# Patient Record
Sex: Female | Born: 1965 | Race: Black or African American | Hispanic: No | Smoking: Never smoker
Health system: Southern US, Community
[De-identification: ages and names within clinical notes are randomized; demographics above are authoritative.]

## PROBLEM LIST (undated history)

## (undated) DIAGNOSIS — I639 Cerebral infarction, unspecified: Secondary | ICD-10-CM

## (undated) DIAGNOSIS — I10 Essential (primary) hypertension: Secondary | ICD-10-CM

## (undated) DIAGNOSIS — D869 Sarcoidosis, unspecified: Secondary | ICD-10-CM

## (undated) DIAGNOSIS — N6019 Diffuse cystic mastopathy of unspecified breast: Secondary | ICD-10-CM

## (undated) DIAGNOSIS — D649 Anemia, unspecified: Secondary | ICD-10-CM

## (undated) DIAGNOSIS — J45909 Unspecified asthma, uncomplicated: Secondary | ICD-10-CM

## (undated) DIAGNOSIS — R918 Other nonspecific abnormal finding of lung field: Secondary | ICD-10-CM

## (undated) DIAGNOSIS — K469 Unspecified abdominal hernia without obstruction or gangrene: Secondary | ICD-10-CM

## (undated) DIAGNOSIS — R0602 Shortness of breath: Secondary | ICD-10-CM

## (undated) DIAGNOSIS — F419 Anxiety disorder, unspecified: Secondary | ICD-10-CM

## (undated) HISTORY — DX: Cerebral infarction, unspecified: I63.9

## (undated) HISTORY — DX: Diffuse cystic mastopathy of unspecified breast: N60.19

## (undated) HISTORY — DX: Anemia, unspecified: D64.9

## (undated) HISTORY — PX: RESECTION RIBS EXTRAPLEURAL: SUR1247

## (undated) HISTORY — DX: Anxiety disorder, unspecified: F41.9

## (undated) HISTORY — PX: BREAST EXCISIONAL BIOPSY: SUR124

## (undated) HISTORY — DX: Other nonspecific abnormal finding of lung field: R91.8

## (undated) HISTORY — DX: Essential (primary) hypertension: I10

## (undated) HISTORY — DX: Unspecified abdominal hernia without obstruction or gangrene: K46.9

---

## 2007-08-30 HISTORY — PX: ABDOMINAL HYSTERECTOMY: SHX81

## 2008-04-01 ENCOUNTER — Ambulatory Visit: Payer: Self-pay | Admitting: Obstetrics & Gynecology

## 2008-04-03 ENCOUNTER — Inpatient Hospital Stay: Payer: Self-pay | Admitting: Obstetrics & Gynecology

## 2008-04-08 ENCOUNTER — Inpatient Hospital Stay: Payer: Self-pay | Admitting: Obstetrics & Gynecology

## 2008-07-11 ENCOUNTER — Inpatient Hospital Stay: Payer: Self-pay | Admitting: Surgery

## 2008-07-11 ENCOUNTER — Ambulatory Visit: Payer: Self-pay | Admitting: Family Medicine

## 2008-08-03 ENCOUNTER — Inpatient Hospital Stay: Payer: Self-pay | Admitting: Obstetrics & Gynecology

## 2008-12-24 ENCOUNTER — Ambulatory Visit: Payer: Self-pay | Admitting: Internal Medicine

## 2009-08-29 DIAGNOSIS — R918 Other nonspecific abnormal finding of lung field: Secondary | ICD-10-CM

## 2009-08-29 DIAGNOSIS — K469 Unspecified abdominal hernia without obstruction or gangrene: Secondary | ICD-10-CM

## 2009-08-29 HISTORY — PX: OTHER SURGICAL HISTORY: SHX169

## 2009-08-29 HISTORY — PX: LUNG BIOPSY: SHX232

## 2009-08-29 HISTORY — DX: Other nonspecific abnormal finding of lung field: R91.8

## 2009-08-29 HISTORY — DX: Unspecified abdominal hernia without obstruction or gangrene: K46.9

## 2009-09-29 ENCOUNTER — Ambulatory Visit: Payer: Self-pay | Admitting: Internal Medicine

## 2009-10-22 ENCOUNTER — Emergency Department: Payer: Self-pay | Admitting: Emergency Medicine

## 2009-10-22 ENCOUNTER — Ambulatory Visit: Payer: Self-pay | Admitting: Internal Medicine

## 2009-10-27 ENCOUNTER — Ambulatory Visit: Payer: Self-pay | Admitting: Internal Medicine

## 2009-10-29 ENCOUNTER — Ambulatory Visit: Payer: Self-pay | Admitting: Internal Medicine

## 2009-11-06 ENCOUNTER — Ambulatory Visit: Payer: Self-pay | Admitting: General Surgery

## 2009-11-11 ENCOUNTER — Ambulatory Visit: Payer: Self-pay | Admitting: General Surgery

## 2009-11-24 ENCOUNTER — Ambulatory Visit: Payer: Self-pay | Admitting: General Surgery

## 2009-11-27 ENCOUNTER — Ambulatory Visit: Payer: Self-pay | Admitting: Internal Medicine

## 2009-12-08 ENCOUNTER — Ambulatory Visit: Payer: Self-pay | Admitting: Internal Medicine

## 2010-02-08 ENCOUNTER — Ambulatory Visit: Payer: Self-pay | Admitting: Internal Medicine

## 2010-03-15 ENCOUNTER — Other Ambulatory Visit: Payer: Self-pay | Admitting: Internal Medicine

## 2010-03-29 ENCOUNTER — Ambulatory Visit: Payer: Self-pay | Admitting: Internal Medicine

## 2010-04-20 ENCOUNTER — Ambulatory Visit: Payer: Self-pay | Admitting: General Surgery

## 2010-04-28 ENCOUNTER — Ambulatory Visit: Payer: Self-pay | Admitting: Internal Medicine

## 2010-07-28 ENCOUNTER — Ambulatory Visit: Payer: Self-pay | Admitting: Internal Medicine

## 2010-08-02 ENCOUNTER — Ambulatory Visit: Payer: Self-pay | Admitting: General Surgery

## 2010-08-10 ENCOUNTER — Ambulatory Visit: Payer: Self-pay | Admitting: General Surgery

## 2010-08-11 ENCOUNTER — Inpatient Hospital Stay: Payer: Self-pay | Admitting: General Surgery

## 2010-11-16 ENCOUNTER — Ambulatory Visit: Payer: Self-pay | Admitting: General Surgery

## 2011-01-12 ENCOUNTER — Emergency Department: Payer: Self-pay | Admitting: Emergency Medicine

## 2011-01-13 ENCOUNTER — Ambulatory Visit: Payer: Self-pay | Admitting: Internal Medicine

## 2011-04-08 ENCOUNTER — Emergency Department: Payer: Self-pay | Admitting: *Deleted

## 2011-04-29 ENCOUNTER — Ambulatory Visit: Payer: Self-pay | Admitting: Internal Medicine

## 2011-05-24 ENCOUNTER — Other Ambulatory Visit: Payer: Self-pay

## 2011-08-01 ENCOUNTER — Inpatient Hospital Stay: Payer: Self-pay | Admitting: Internal Medicine

## 2011-08-09 ENCOUNTER — Ambulatory Visit: Payer: Self-pay | Admitting: Internal Medicine

## 2011-08-30 DIAGNOSIS — N6019 Diffuse cystic mastopathy of unspecified breast: Secondary | ICD-10-CM

## 2011-08-30 HISTORY — DX: Diffuse cystic mastopathy of unspecified breast: N60.19

## 2011-11-22 ENCOUNTER — Ambulatory Visit: Payer: Self-pay | Admitting: General Surgery

## 2012-05-18 ENCOUNTER — Ambulatory Visit: Payer: Self-pay | Admitting: Physician Assistant

## 2012-05-18 LAB — CBC WITH DIFFERENTIAL/PLATELET
Basophil #: 0 10*3/uL (ref 0.0–0.1)
Basophil %: 0.6 %
Eosinophil #: 0.2 10*3/uL (ref 0.0–0.7)
Eosinophil %: 2.9 %
HCT: 38.7 % (ref 35.0–47.0)
HGB: 12.9 g/dL (ref 12.0–16.0)
Lymphocyte #: 2 10*3/uL (ref 1.0–3.6)
Lymphocyte %: 30.2 %
MCH: 27.8 pg (ref 26.0–34.0)
MCHC: 33.2 g/dL (ref 32.0–36.0)
MCV: 84 fL (ref 80–100)
Monocyte #: 0.6 x10 3/mm (ref 0.2–0.9)
Monocyte %: 9.7 %
Neutrophil #: 3.8 10*3/uL (ref 1.4–6.5)
Neutrophil %: 56.6 %
Platelet: 222 10*3/uL (ref 150–440)
RBC: 4.63 10*6/uL (ref 3.80–5.20)
RDW: 14.2 % (ref 11.5–14.5)
WBC: 6.6 10*3/uL (ref 3.6–11.0)

## 2012-05-18 LAB — COMPREHENSIVE METABOLIC PANEL
Albumin: 3.6 g/dL (ref 3.4–5.0)
Alkaline Phosphatase: 112 U/L (ref 50–136)
Anion Gap: 9 (ref 7–16)
BUN: 10 mg/dL (ref 7–18)
Bilirubin,Total: 0.1 mg/dL — ABNORMAL LOW (ref 0.2–1.0)
Calcium, Total: 9.3 mg/dL (ref 8.5–10.1)
Chloride: 108 mmol/L — ABNORMAL HIGH (ref 98–107)
Co2: 28 mmol/L (ref 21–32)
Creatinine: 0.85 mg/dL (ref 0.60–1.30)
EGFR (African American): >60
EGFR (Non-African Amer.): >60
Glucose: 77 mg/dL (ref 65–99)
Osmolality: 287 (ref 275–301)
Potassium: 3.2 mmol/L — ABNORMAL LOW (ref 3.5–5.1)
SGOT(AST): 17 U/L (ref 15–37)
SGPT (ALT): 19 U/L (ref 12–78)
Sodium: 145 mmol/L (ref 136–145)
Total Protein: 7.7 g/dL (ref 6.4–8.2)

## 2012-05-18 LAB — TSH: Thyroid Stimulating Horm: 0.612 u[IU]/mL

## 2012-05-18 LAB — T4, FREE: Free Thyroxine: 0.99 ng/dL (ref 0.76–1.46)

## 2012-06-12 ENCOUNTER — Ambulatory Visit: Payer: Self-pay | Admitting: Internal Medicine

## 2012-06-21 ENCOUNTER — Ambulatory Visit: Payer: Self-pay | Admitting: Cardiothoracic Surgery

## 2012-06-25 ENCOUNTER — Ambulatory Visit: Payer: Self-pay | Admitting: Internal Medicine

## 2012-06-25 LAB — BASIC METABOLIC PANEL
Anion Gap: 10 (ref 7–16)
BUN: 13 mg/dL (ref 7–18)
Calcium, Total: 9.2 mg/dL (ref 8.5–10.1)
Chloride: 106 mmol/L (ref 98–107)
Co2: 24 mmol/L (ref 21–32)
Creatinine: 0.87 mg/dL (ref 0.60–1.30)
EGFR (African American): >60
EGFR (Non-African Amer.): >60
Glucose: 72 mg/dL (ref 65–99)
Osmolality: 278 (ref 275–301)
Potassium: 3.3 mmol/L — ABNORMAL LOW (ref 3.5–5.1)
Sodium: 140 mmol/L (ref 136–145)

## 2012-06-25 LAB — CBC WITH DIFFERENTIAL/PLATELET
Basophil #: 0 10*3/uL (ref 0.0–0.1)
Basophil %: 0.8 %
Eosinophil #: 0.2 10*3/uL (ref 0.0–0.7)
Eosinophil %: 2.8 %
HCT: 38.1 % (ref 35.0–47.0)
HGB: 12.7 g/dL (ref 12.0–16.0)
Lymphocyte #: 1.5 10*3/uL (ref 1.0–3.6)
Lymphocyte %: 27.4 %
MCH: 27.9 pg (ref 26.0–34.0)
MCHC: 33.4 g/dL (ref 32.0–36.0)
MCV: 83 fL (ref 80–100)
Monocyte #: 0.5 x10 3/mm (ref 0.2–0.9)
Monocyte %: 8.5 %
Neutrophil #: 3.3 10*3/uL (ref 1.4–6.5)
Neutrophil %: 60.5 %
Platelet: 188 10*3/uL (ref 150–440)
RBC: 4.57 10*6/uL (ref 3.80–5.20)
RDW: 13.9 % (ref 11.5–14.5)
WBC: 5.5 10*3/uL (ref 3.6–11.0)

## 2012-06-25 LAB — TSH: Thyroid Stimulating Horm: 1.77 u[IU]/mL

## 2012-06-25 LAB — SEDIMENTATION RATE: Erythrocyte Sed Rate: 21 mm/hr — ABNORMAL HIGH (ref 0–20)

## 2012-06-28 ENCOUNTER — Ambulatory Visit: Payer: Self-pay | Admitting: Cardiothoracic Surgery

## 2012-07-02 ENCOUNTER — Other Ambulatory Visit (HOSPITAL_COMMUNITY): Payer: Self-pay | Admitting: Cardiothoracic Surgery

## 2012-07-02 DIAGNOSIS — R918 Other nonspecific abnormal finding of lung field: Secondary | ICD-10-CM

## 2012-07-03 ENCOUNTER — Ambulatory Visit: Payer: Self-pay | Admitting: Internal Medicine

## 2012-07-03 ENCOUNTER — Encounter (HOSPITAL_COMMUNITY): Payer: Self-pay | Admitting: Pharmacy Technician

## 2012-07-06 ENCOUNTER — Other Ambulatory Visit: Payer: Self-pay | Admitting: Radiology

## 2012-07-09 ENCOUNTER — Ambulatory Visit (HOSPITAL_COMMUNITY)
Admission: RE | Admit: 2012-07-09 | Discharge: 2012-07-09 | Disposition: A | Discharge status: Home / Self Care | Payer: No Typology Code available for payment source | Source: Ambulatory Visit | Attending: Cardiothoracic Surgery | Admitting: Cardiothoracic Surgery

## 2012-07-09 ENCOUNTER — Encounter (HOSPITAL_COMMUNITY): Payer: Self-pay

## 2012-07-09 DIAGNOSIS — D869 Sarcoidosis, unspecified: Secondary | ICD-10-CM | POA: Insufficient documentation

## 2012-07-09 DIAGNOSIS — R918 Other nonspecific abnormal finding of lung field: Secondary | ICD-10-CM

## 2012-07-09 DIAGNOSIS — Z90710 Acquired absence of both cervix and uterus: Secondary | ICD-10-CM | POA: Insufficient documentation

## 2012-07-09 DIAGNOSIS — R222 Localized swelling, mass and lump, trunk: Secondary | ICD-10-CM | POA: Insufficient documentation

## 2012-07-09 HISTORY — DX: Sarcoidosis, unspecified: D86.9

## 2012-07-09 HISTORY — DX: Shortness of breath: R06.02

## 2012-07-09 LAB — CBC
HCT: 43.3 % (ref 36.0–46.0)
Hemoglobin: 14.4 g/dL (ref 12.0–15.0)
MCH: 27 pg (ref 26.0–34.0)
MCHC: 33.3 g/dL (ref 30.0–36.0)
MCV: 81.2 fL (ref 78.0–100.0)
Platelets: 243 10*3/uL (ref 150–400)
RBC: 5.33 MIL/uL — ABNORMAL HIGH (ref 3.87–5.11)
RDW: 13.2 % (ref 11.5–15.5)
WBC: 6.1 10*3/uL (ref 4.0–10.5)

## 2012-07-09 LAB — PROTIME-INR
INR: 0.97 (ref 0.00–1.49)
Prothrombin Time: 12.8 seconds (ref 11.6–15.2)

## 2012-07-09 LAB — APTT: aPTT: 40 seconds — ABNORMAL HIGH (ref 24–37)

## 2012-07-09 MED ORDER — MIDAZOLAM HCL 2 MG/2ML IJ SOLN
INTRAMUSCULAR | Status: AC
  Filled 2012-07-09: qty 4

## 2012-07-09 MED ORDER — MIDAZOLAM HCL 2 MG/2ML IJ SOLN
INTRAMUSCULAR | Status: AC | PRN
  Administered 2012-07-09 (×3): 1 mg via INTRAVENOUS

## 2012-07-09 MED ORDER — FENTANYL CITRATE 0.05 MG/ML IJ SOLN
INTRAMUSCULAR | Status: AC
  Filled 2012-07-09: qty 4

## 2012-07-09 MED ORDER — SODIUM CHLORIDE 0.9 % IV SOLN
Freq: Once | INTRAVENOUS | Status: AC
  Administered 2012-07-09: 13:00:00 via INTRAVENOUS

## 2012-07-09 MED ORDER — ALBUTEROL SULFATE (5 MG/ML) 0.5% IN NEBU
2.5000 mg | INHALATION_SOLUTION | RESPIRATORY_TRACT | Status: DC | PRN
  Filled 2012-07-09: qty 0.5

## 2012-07-09 MED ORDER — HYDROCODONE-ACETAMINOPHEN 5-325 MG PO TABS
1.0000 | ORAL_TABLET | ORAL | Status: DC | PRN
  Filled 2012-07-09: qty 2

## 2012-07-09 MED ORDER — FENTANYL CITRATE 0.05 MG/ML IJ SOLN
INTRAMUSCULAR | Status: AC | PRN
  Administered 2012-07-09 (×3): 50 ug via INTRAVENOUS

## 2012-07-09 NOTE — Procedures (Signed)
Procedure:  CT guided core biopsy of right upper lobe nodule Findings:  1.7 cm RUL nodule sampled with 18 G core biopsy x 2 via 17 G needle.  Some hemorrhage deep to lesion.  No PTX on post-CT imaging.

## 2012-07-09 NOTE — Progress Notes (Signed)
Patient ambulated to restroom without any problems. Room air 02 sats were 100%. Patient denies SOB, dizziness and lightheadedness. Patient only complains of pain is when she is coughing or taking in a deep breath.

## 2012-07-09 NOTE — H&P (Signed)
Agree 

## 2012-07-09 NOTE — H&P (Signed)
Brittany Huynh is an 46 y.o. female.   Chief Complaint: right lung mass [id number] with history of sarcoidosis and recent  imaging studies revealing a hypermetabolic RUL lung mass. She presents today for CT guided biopsy of the lung mass.  Past Medical History  Diagnosis Date  . Shortness of breath     2011  . Sarcoidosis   migraines  Past Surgical History  Procedure Date  . Abdominal hysterectomy     2009  left lung/LN biopsy  History reviewed. No pertinent family history. Social History:  reports that she has never smoked. She does not have any smokeless tobacco history on file. She reports that she drinks alcohol. She reports that she does not use illicit drugs.  Allergies:  Allergies  Allergen Reactions  . Onion Anaphylaxis  . Adhesive (Tape) Rash    Current outpatient prescriptions:albuterol (PROVENTIL HFA;VENTOLIN HFA) 108 (90 BASE) MCG/ACT inhaler, Inhale 2 puffs into the lungs every 6 (six) hours as needed. Wheezing, Disp: , Rfl: ;  aspirin-acetaminophen-caffeine (EXCEDRIN MIGRAINE) 250-250-65 MG per tablet, Take 1 tablet by mouth every 6 (six) hours as needed. Headache, Disp: , Rfl:  budesonide-formoterol (SYMBICORT) 160-4.5 MCG/ACT inhaler, Inhale 2 puffs into the lungs 2 (two) times daily., Disp: , Rfl: ;  celecoxib (CELEBREX) 200 MG capsule, Take 200 mg by mouth 2 (two) times daily., Disp: , Rfl: ;  mirtazapine (REMERON) 15 MG tablet, Take 15 mg by mouth at bedtime., Disp: , Rfl: ;  rOPINIRole (REQUIP) 0.5 MG tablet, Take 0.5-1 mg by mouth at bedtime., Disp: , Rfl:  Current facility-administered medications:[COMPLETED] 0.9 %  sodium chloride infusion, , Intravenous, Once, Robet Leu, PA, Last Rate: 20 mL/hr at 07/09/12 1235   Results for orders placed during the hospital encounter of 07/09/12  CBC      Component Value Range   WBC 6.1  4.0 - 10.5 K/uL   RBC 5.33 (*) 3.87 - 5.11 MIL/uL   Hemoglobin 14.4  12.0 - 15.0 g/dL   HCT 09.8  11.9 - 14.7 %   MCV 81.2   78.0 - 100.0 fL   MCH 27.0  26.0 - 34.0 pg   MCHC 33.3  30.0 - 36.0 g/dL   RDW 82.9  56.2 - 13.0 %   Platelets 243  150 - 400 K/uL    Review of Systems  Constitutional:       Occ fevers/chills  Respiratory: Positive for cough and shortness of breath.   Cardiovascular: Positive for chest pain.  Gastrointestinal: Negative for nausea, vomiting and abdominal pain.  Musculoskeletal: Negative for back pain.  Neurological: Positive for headaches.  Endo/Heme/Allergies: Does not bruise/bleed easily.    Blood pressure 142/90, pulse 82, temperature 98 F (36.7 C), temperature source Oral, resp. rate 18, height 5' 6.5" (1.689 m), weight 152 lb (68.947 kg), SpO2 100.00%. Physical Exam  Constitutional: She is oriented to person, place, and time. She appears well-developed and well-nourished.  Cardiovascular: Normal rate and regular rhythm.   Respiratory: Effort normal and breath sounds normal.  GI: Soft. Bowel sounds are normal. There is no tenderness.  Musculoskeletal: Normal range of motion. She exhibits no edema.  Neurological: She is alert and oriented to person, place, and time.     Assessment/Plan Patient with history of sarcoidosis and hypermetabolic RUL lung mass. Plan is for CT guided right lung mass biopsy today. Details/risks of procedure d/w pt/family with their understanding and consent.  Brittany Huynh,D KEVIN 07/09/2012, 12:48 PM

## 2012-07-19 ENCOUNTER — Ambulatory Visit: Payer: Self-pay | Admitting: Internal Medicine

## 2012-08-27 ENCOUNTER — Other Ambulatory Visit: Payer: Self-pay | Admitting: Physician Assistant

## 2012-08-27 LAB — IRON AND TIBC
Iron Bind.Cap.(Total): 317 ug/dL (ref 250–450)
Iron Saturation: 17 %
Iron: 54 ug/dL (ref 50–170)
Unbound Iron-Bind.Cap.: 263 ug/dL

## 2012-08-27 LAB — CBC WITH DIFFERENTIAL/PLATELET
Basophil #: 0.1 10*3/uL (ref 0.0–0.1)
Basophil %: 0.5 %
Eosinophil #: 0 10*3/uL (ref 0.0–0.7)
Eosinophil %: 0 %
HCT: 41.4 % (ref 35.0–47.0)
HGB: 13.7 g/dL (ref 12.0–16.0)
Lymphocyte #: 0.6 10*3/uL — ABNORMAL LOW (ref 1.0–3.6)
Lymphocyte %: 6 %
MCH: 27.9 pg (ref 26.0–34.0)
MCHC: 33 g/dL (ref 32.0–36.0)
MCV: 84 fL (ref 80–100)
Monocyte #: 0.1 x10 3/mm — ABNORMAL LOW (ref 0.2–0.9)
Monocyte %: 1.1 %
Neutrophil #: 9.4 10*3/uL — ABNORMAL HIGH (ref 1.4–6.5)
Neutrophil %: 92.4 %
Platelet: 216 10*3/uL (ref 150–440)
RBC: 4.91 10*6/uL (ref 3.80–5.20)
RDW: 15 % — ABNORMAL HIGH (ref 11.5–14.5)
WBC: 10.1 10*3/uL (ref 3.6–11.0)

## 2012-08-27 LAB — BASIC METABOLIC PANEL
Anion Gap: 7 (ref 7–16)
BUN: 16 mg/dL (ref 7–18)
Calcium, Total: 9.1 mg/dL (ref 8.5–10.1)
Chloride: 103 mmol/L (ref 98–107)
Co2: 28 mmol/L (ref 21–32)
Creatinine: 0.91 mg/dL (ref 0.60–1.30)
EGFR (African American): >60
EGFR (Non-African Amer.): >60
Glucose: 155 mg/dL — ABNORMAL HIGH (ref 65–99)
Osmolality: 280 (ref 275–301)
Potassium: 4.4 mmol/L (ref 3.5–5.1)
Sodium: 138 mmol/L (ref 136–145)

## 2012-08-27 LAB — FERRITIN: Ferritin (ARMC): 24 ng/mL (ref 8–388)

## 2012-10-13 ENCOUNTER — Other Ambulatory Visit: Payer: Self-pay

## 2012-10-19 ENCOUNTER — Encounter: Payer: Self-pay | Admitting: *Deleted

## 2012-12-10 ENCOUNTER — Ambulatory Visit: Payer: Self-pay | Admitting: General Surgery

## 2013-01-01 ENCOUNTER — Ambulatory Visit: Payer: Self-pay | Admitting: General Surgery

## 2013-01-16 ENCOUNTER — Ambulatory Visit: Payer: Self-pay | Admitting: General Surgery

## 2013-01-23 ENCOUNTER — Ambulatory Visit: Payer: Self-pay | Admitting: General Surgery

## 2013-01-28 ENCOUNTER — Encounter: Payer: Self-pay | Admitting: General Surgery

## 2013-01-30 ENCOUNTER — Encounter: Payer: Self-pay | Admitting: *Deleted

## 2013-02-15 ENCOUNTER — Other Ambulatory Visit: Payer: Self-pay

## 2013-02-15 LAB — BASIC METABOLIC PANEL
Anion Gap: 4 — ABNORMAL LOW (ref 7–16)
BUN: 8 mg/dL (ref 7–18)
Calcium, Total: 8.9 mg/dL (ref 8.5–10.1)
Chloride: 105 mmol/L (ref 98–107)
Co2: 29 mmol/L (ref 21–32)
Creatinine: 0.82 mg/dL (ref 0.60–1.30)
EGFR (African American): >60
EGFR (Non-African Amer.): >60
Glucose: 108 mg/dL — ABNORMAL HIGH (ref 65–99)
Osmolality: 275 (ref 275–301)
Potassium: 4 mmol/L (ref 3.5–5.1)
Sodium: 138 mmol/L (ref 136–145)

## 2013-02-15 LAB — CBC
HCT: 39.4 % (ref 35.0–47.0)
HGB: 13.1 g/dL (ref 12.0–16.0)
MCH: 28 pg (ref 26.0–34.0)
MCHC: 33.3 g/dL (ref 32.0–36.0)
MCV: 84 fL (ref 80–100)
Platelet: 188 10*3/uL (ref 150–440)
RBC: 4.67 10*6/uL (ref 3.80–5.20)
RDW: 14.1 % (ref 11.5–14.5)
WBC: 9.5 10*3/uL (ref 3.6–11.0)

## 2013-02-15 LAB — SEDIMENTATION RATE: Erythrocyte Sed Rate: 9 mm/hr (ref 0–20)

## 2013-02-15 LAB — TSH: Thyroid Stimulating Horm: 0.114 u[IU]/mL — ABNORMAL LOW

## 2013-04-04 ENCOUNTER — Ambulatory Visit (INDEPENDENT_AMBULATORY_CARE_PROVIDER_SITE_OTHER): Payer: No Typology Code available for payment source | Admitting: General Surgery

## 2013-04-04 ENCOUNTER — Other Ambulatory Visit: Payer: Self-pay

## 2013-04-04 ENCOUNTER — Encounter: Payer: Self-pay | Admitting: General Surgery

## 2013-04-04 VITALS — BP 140/78 | HR 80 | Resp 12 | Ht 67.0 in | Wt 151.0 lb

## 2013-04-04 DIAGNOSIS — Z1239 Encounter for other screening for malignant neoplasm of breast: Secondary | ICD-10-CM

## 2013-04-04 DIAGNOSIS — N63 Unspecified lump in unspecified breast: Secondary | ICD-10-CM

## 2013-04-04 DIAGNOSIS — N6019 Diffuse cystic mastopathy of unspecified breast: Secondary | ICD-10-CM

## 2013-04-04 NOTE — Patient Instructions (Addendum)
Continue self breast exams. Call office for any new breast issues or concerns. 

## 2013-04-04 NOTE — Progress Notes (Signed)
Patient ID: Demita Tobia, female   DOB: May 16, 1966, 47 y.o.   MRN: [id number]  Chief Complaint  Patient presents with  . Other    mammogram    HPI Geraldin Habermehl is a 47 y.o. female who presents for a breast evaluation. The most recent mammogram was done on May 2014. States left breast is tender and possible knot in right breast for about 1-2 months. Patient does perform regular self breast checks and gets regular mammograms done.  Wearing brace on right ankle from recent sprain.  HPI  Past Medical History  Diagnosis Date  . Shortness of breath     2011  . Sarcoidosis   . Hernia 2011    chest wall  . Lung mass 2011  . Breast fibrocystic disorder 2013    Past Surgical History  Procedure Laterality Date  . Abdominal hysterectomy  2009  . Chamberlain procedure   2011  . Lung biopsy  2011  . Resection ribs extrapleural  [id number]    Family History  Problem Relation Age of Onset  . Liver cancer Mother   . Bone cancer Maternal Aunt   . Breast cancer Paternal Uncle   . Breast cancer Maternal Aunt     Social History History  Substance Use Topics  . Smoking status: Never Smoker   . Smokeless tobacco: Never Used  . Alcohol Use: Yes     Comment: on occassion    Allergies  Allergen Reactions  . Onion Anaphylaxis  . Adhesive (Tape) Rash    Current Outpatient Prescriptions  Medication Sig Dispense Refill  . albuterol (PROVENTIL HFA;VENTOLIN HFA) 108 (90 BASE) MCG/ACT inhaler Inhale 2 puffs into the lungs every 6 (six) hours as needed. Wheezing      . budesonide-formoterol (SYMBICORT) 160-4.5 MCG/ACT inhaler Inhale 2 puffs into the lungs 2 (two) times daily.      Marland Kitchen gabapentin (NEURONTIN) 100 MG capsule Take 1 capsule by mouth 3 (three) times daily.      . hydrochlorothiazide (HYDRODIURIL) 25 MG tablet Take 1 tablet by mouth daily.      Letta Pate DELICA LANCETS 33G MISC Take 1 tablet by mouth daily.      . predniSONE (DELTASONE) 20 MG tablet Take 1 tablet by mouth 2 (two)  times daily.      . sertraline (ZOLOFT) 100 MG tablet Take 1 tablet by mouth daily.      Marland Kitchen venlafaxine (EFFEXOR) 37.5 MG tablet Take 1 tablet by mouth daily at 6 (six) AM.      . aspirin-acetaminophen-caffeine (EXCEDRIN MIGRAINE) 250-250-65 MG per tablet Take 1 tablet by mouth every 6 (six) hours as needed. Headache      . celecoxib (CELEBREX) 200 MG capsule Take 200 mg by mouth 2 (two) times daily.      . mirtazapine (REMERON) 15 MG tablet Take 15 mg by mouth at bedtime.      Marland Kitchen rOPINIRole (REQUIP) 0.5 MG tablet Take 0.5-1 mg by mouth at bedtime.      Marland Kitchen zolpidem (AMBIEN) 10 MG tablet Take 1 tablet by mouth daily.       No current facility-administered medications for this visit.    Review of Systems Review of Systems  Constitutional: Negative.   Respiratory: Positive for shortness of breath.   Cardiovascular: Negative.     Blood pressure 140/78, pulse 80, resp. rate 12, height 5\' 7"  (1.702 m), weight 151 lb (68.493 kg).  Physical Exam Physical Exam  Constitutional: She is oriented to person, place, and time.  She appears well-developed and well-nourished.  Eyes: Conjunctivae are normal.  Neck: Neck supple.  Cardiovascular: Normal rate and regular rhythm.   Pulmonary/Chest: Effort normal and breath sounds normal. Right breast exhibits mass. Right breast exhibits no inverted nipple, no nipple discharge, no skin change and no tenderness. Left breast exhibits no inverted nipple, no mass, no nipple discharge, no skin change and no tenderness.    Lymphadenopathy:    She has no cervical adenopathy.    She has no axillary adenopathy.  Neurological: She is alert and oriented to person, place, and time.  Skin: Skin is warm and dry.    Data Reviewed Mammogram reviewed and stable.  Assessment    Ultrasound showed one tiny cyst no other findings in that area of thickening. Based on findings no need for interventions at this time.    Plan    One year bilateral screening mammogram and  office visit.       SANKAR,SEEPLAPUTHUR G 04/05/2013, 5:42 AM

## 2013-04-05 ENCOUNTER — Encounter: Payer: Self-pay | Admitting: General Surgery

## 2013-04-05 DIAGNOSIS — S2002XA Contusion of left breast, initial encounter: Secondary | ICD-10-CM | POA: Insufficient documentation

## 2013-04-05 DIAGNOSIS — Z1239 Encounter for other screening for malignant neoplasm of breast: Secondary | ICD-10-CM | POA: Insufficient documentation

## 2013-04-05 DIAGNOSIS — N6019 Diffuse cystic mastopathy of unspecified breast: Secondary | ICD-10-CM | POA: Insufficient documentation

## 2013-04-17 ENCOUNTER — Other Ambulatory Visit: Payer: Self-pay | Admitting: Physician Assistant

## 2013-04-17 LAB — COMPREHENSIVE METABOLIC PANEL
Albumin: 3.6 g/dL (ref 3.4–5.0)
Alkaline Phosphatase: 90 U/L (ref 50–136)
Anion Gap: 4 — ABNORMAL LOW (ref 7–16)
BUN: 22 mg/dL — ABNORMAL HIGH (ref 7–18)
Bilirubin,Total: 0.2 mg/dL (ref 0.2–1.0)
Calcium, Total: 9.4 mg/dL (ref 8.5–10.1)
Chloride: 106 mmol/L (ref 98–107)
Co2: 30 mmol/L (ref 21–32)
Creatinine: 0.86 mg/dL (ref 0.60–1.30)
EGFR (African American): >60
EGFR (Non-African Amer.): >60
Glucose: 88 mg/dL (ref 65–99)
Osmolality: 282 (ref 275–301)
Potassium: 3.8 mmol/L (ref 3.5–5.1)
SGOT(AST): 17 U/L (ref 15–37)
SGPT (ALT): 38 U/L (ref 12–78)
Sodium: 140 mmol/L (ref 136–145)
Total Protein: 7 g/dL (ref 6.4–8.2)

## 2013-04-17 LAB — CBC WITH DIFFERENTIAL/PLATELET
Basophil #: 0 10*3/uL (ref 0.0–0.1)
Basophil %: 0.2 %
Eosinophil #: 0 10*3/uL (ref 0.0–0.7)
Eosinophil %: 0 %
HCT: 42.4 % (ref 35.0–47.0)
HGB: 14.1 g/dL (ref 12.0–16.0)
Lymphocyte #: 0.9 10*3/uL — ABNORMAL LOW (ref 1.0–3.6)
Lymphocyte %: 8.2 %
MCH: 28.2 pg (ref 26.0–34.0)
MCHC: 33.3 g/dL (ref 32.0–36.0)
MCV: 85 fL (ref 80–100)
Monocyte #: 0.5 x10 3/mm (ref 0.2–0.9)
Monocyte %: 4.8 %
Neutrophil #: 9.6 10*3/uL — ABNORMAL HIGH (ref 1.4–6.5)
Neutrophil %: 86.8 %
Platelet: 164 10*3/uL (ref 150–440)
RBC: 5.01 10*6/uL (ref 3.80–5.20)
RDW: 14.1 % (ref 11.5–14.5)
WBC: 11 10*3/uL (ref 3.6–11.0)

## 2013-04-17 LAB — TSH: Thyroid Stimulating Horm: 0.371 u[IU]/mL — ABNORMAL LOW

## 2013-04-17 LAB — CK: CK, Total: 103 U/L (ref 21–215)

## 2013-04-17 LAB — SEDIMENTATION RATE: Erythrocyte Sed Rate: 4 mm/hr (ref 0–20)

## 2013-06-03 ENCOUNTER — Other Ambulatory Visit: Payer: Self-pay | Admitting: Physician Assistant

## 2013-06-03 LAB — CBC WITH DIFFERENTIAL/PLATELET
Basophil #: 0.1 10*3/uL (ref 0.0–0.1)
Basophil %: 1.3 %
Eosinophil #: 0.3 10*3/uL (ref 0.0–0.7)
Eosinophil %: 4.8 %
HCT: 38.7 % (ref 35.0–47.0)
HGB: 13.3 g/dL (ref 12.0–16.0)
Lymphocyte #: 1.3 10*3/uL (ref 1.0–3.6)
Lymphocyte %: 21 %
MCH: 28.5 pg (ref 26.0–34.0)
MCHC: 34.3 g/dL (ref 32.0–36.0)
MCV: 83 fL (ref 80–100)
Monocyte #: 0.6 x10 3/mm (ref 0.2–0.9)
Monocyte %: 9.2 %
Neutrophil #: 4 10*3/uL (ref 1.4–6.5)
Neutrophil %: 63.7 %
Platelet: 197 10*3/uL (ref 150–440)
RBC: 4.65 10*6/uL (ref 3.80–5.20)
RDW: 14.1 % (ref 11.5–14.5)
WBC: 6.2 10*3/uL (ref 3.6–11.0)

## 2013-06-03 LAB — TSH: Thyroid Stimulating Horm: 0.716 u[IU]/mL

## 2013-06-03 LAB — SEDIMENTATION RATE: Erythrocyte Sed Rate: 35 mm/hr — ABNORMAL HIGH (ref 0–20)

## 2013-07-04 ENCOUNTER — Other Ambulatory Visit: Payer: Self-pay

## 2013-10-07 ENCOUNTER — Emergency Department: Payer: Self-pay | Admitting: Emergency Medicine

## 2013-10-07 LAB — BASIC METABOLIC PANEL
Anion Gap: 5 — ABNORMAL LOW (ref 7–16)
BUN: 16 mg/dL (ref 7–18)
Calcium, Total: 9.8 mg/dL (ref 8.5–10.1)
Chloride: 105 mmol/L (ref 98–107)
Co2: 29 mmol/L (ref 21–32)
Creatinine: 0.87 mg/dL (ref 0.60–1.30)
EGFR (African American): >60
EGFR (Non-African Amer.): >60
Glucose: 99 mg/dL (ref 65–99)
Osmolality: 279 (ref 275–301)
Potassium: 3.6 mmol/L (ref 3.5–5.1)
Sodium: 139 mmol/L (ref 136–145)

## 2013-10-07 LAB — CBC
HCT: 46.6 % (ref 35.0–47.0)
HGB: 14.9 g/dL (ref 12.0–16.0)
MCH: 27.2 pg (ref 26.0–34.0)
MCHC: 31.9 g/dL — ABNORMAL LOW (ref 32.0–36.0)
MCV: 85 fL (ref 80–100)
Platelet: 201 10*3/uL (ref 150–440)
RBC: 5.47 10*6/uL — ABNORMAL HIGH (ref 3.80–5.20)
RDW: 14.6 % — ABNORMAL HIGH (ref 11.5–14.5)
WBC: 10.5 10*3/uL (ref 3.6–11.0)

## 2013-10-07 LAB — TROPONIN I
Troponin-I: <0.02 ng/mL
Troponin-I: <0.02 ng/mL

## 2013-10-07 LAB — PRO B NATRIURETIC PEPTIDE: B-Type Natriuretic Peptide: 21 pg/mL (ref 0–125)

## 2013-12-14 ENCOUNTER — Emergency Department: Payer: Self-pay | Admitting: Emergency Medicine

## 2013-12-14 LAB — URINALYSIS, COMPLETE
Bacteria: NONE SEEN
Bilirubin,UR: NEGATIVE
Blood: NEGATIVE
Glucose,UR: NEGATIVE mg/dL (ref 0–75)
Ketone: NEGATIVE
Leukocyte Esterase: NEGATIVE
Nitrite: NEGATIVE
Ph: 8 (ref 4.5–8.0)
Protein: NEGATIVE
RBC,UR: 1 /HPF (ref 0–5)
Specific Gravity: 1.011 (ref 1.003–1.030)
Squamous Epithelial: 2
WBC UR: <1 /HPF (ref 0–5)

## 2013-12-14 LAB — COMPREHENSIVE METABOLIC PANEL
Albumin: 3.5 g/dL (ref 3.4–5.0)
Alkaline Phosphatase: 98 U/L
Anion Gap: 8 (ref 7–16)
BUN: 9 mg/dL (ref 7–18)
Bilirubin,Total: 0.2 mg/dL (ref 0.2–1.0)
Calcium, Total: 8.7 mg/dL (ref 8.5–10.1)
Chloride: 106 mmol/L (ref 98–107)
Co2: 25 mmol/L (ref 21–32)
Creatinine: 0.96 mg/dL (ref 0.60–1.30)
EGFR (African American): >60
EGFR (Non-African Amer.): >60
Glucose: 103 mg/dL — ABNORMAL HIGH (ref 65–99)
Osmolality: 276 (ref 275–301)
Potassium: 3.3 mmol/L — ABNORMAL LOW (ref 3.5–5.1)
SGOT(AST): 27 U/L (ref 15–37)
SGPT (ALT): 30 U/L (ref 12–78)
Sodium: 139 mmol/L (ref 136–145)
Total Protein: 7.4 g/dL (ref 6.4–8.2)

## 2013-12-14 LAB — TROPONIN I: Troponin-I: <0.02 ng/mL

## 2013-12-14 LAB — CBC
HCT: 43.4 % (ref 35.0–47.0)
HGB: 13.9 g/dL (ref 12.0–16.0)
MCH: 27.4 pg (ref 26.0–34.0)
MCHC: 31.9 g/dL — ABNORMAL LOW (ref 32.0–36.0)
MCV: 86 fL (ref 80–100)
Platelet: 183 10*3/uL (ref 150–440)
RBC: 5.06 10*6/uL (ref 3.80–5.20)
RDW: 14.6 % — ABNORMAL HIGH (ref 11.5–14.5)
WBC: 8 10*3/uL (ref 3.6–11.0)

## 2013-12-14 LAB — D-DIMER(ARMC): D-Dimer: 430 ng/ml

## 2014-02-10 ENCOUNTER — Observation Stay: Payer: Self-pay | Admitting: Internal Medicine

## 2014-02-10 LAB — COMPREHENSIVE METABOLIC PANEL
Albumin: 3 g/dL — ABNORMAL LOW (ref 3.4–5.0)
Alkaline Phosphatase: 87 U/L
Anion Gap: 9 (ref 7–16)
BUN: 11 mg/dL (ref 7–18)
Bilirubin,Total: 0.4 mg/dL (ref 0.2–1.0)
Calcium, Total: 8.7 mg/dL (ref 8.5–10.1)
Chloride: 106 mmol/L (ref 98–107)
Co2: 25 mmol/L (ref 21–32)
Creatinine: 0.86 mg/dL (ref 0.60–1.30)
EGFR (African American): >60
EGFR (Non-African Amer.): >60
Glucose: 70 mg/dL (ref 65–99)
Osmolality: 277 (ref 275–301)
Potassium: 3.6 mmol/L (ref 3.5–5.1)
SGOT(AST): 18 U/L (ref 15–37)
SGPT (ALT): 18 U/L (ref 12–78)
Sodium: 140 mmol/L (ref 136–145)
Total Protein: 6.5 g/dL (ref 6.4–8.2)

## 2014-02-10 LAB — DRUG SCREEN, URINE
Amphetamines, Ur Screen: NEGATIVE (ref ?–1000)
Barbiturates, Ur Screen: POSITIVE (ref ?–200)
Benzodiazepine, Ur Scrn: NEGATIVE (ref ?–200)
Cannabinoid 50 Ng, Ur ~~LOC~~: NEGATIVE (ref ?–50)
Cocaine Metabolite,Ur ~~LOC~~: NEGATIVE (ref ?–300)
MDMA (Ecstasy)Ur Screen: NEGATIVE (ref ?–500)
Methadone, Ur Screen: NEGATIVE (ref ?–300)
Opiate, Ur Screen: NEGATIVE (ref ?–300)
Phencyclidine (PCP) Ur S: NEGATIVE (ref ?–25)
Tricyclic, Ur Screen: NEGATIVE (ref ?–1000)

## 2014-02-10 LAB — CBC
HCT: 45.6 % (ref 35.0–47.0)
HGB: 14.5 g/dL (ref 12.0–16.0)
MCH: 27.2 pg (ref 26.0–34.0)
MCHC: 31.7 g/dL — ABNORMAL LOW (ref 32.0–36.0)
MCV: 86 fL (ref 80–100)
Platelet: 147 10*3/uL — ABNORMAL LOW (ref 150–440)
RBC: 5.32 10*6/uL — ABNORMAL HIGH (ref 3.80–5.20)
RDW: 14.3 % (ref 11.5–14.5)
WBC: 6 10*3/uL (ref 3.6–11.0)

## 2014-02-10 LAB — PREGNANCY, URINE: Pregnancy Test, Urine: NEGATIVE m[IU]/mL

## 2014-02-10 LAB — URINALYSIS, COMPLETE
Bacteria: NONE SEEN
Bilirubin,UR: NEGATIVE
Blood: NEGATIVE
Glucose,UR: NEGATIVE mg/dL (ref 0–75)
Ketone: NEGATIVE
Leukocyte Esterase: NEGATIVE
Nitrite: NEGATIVE
Ph: 6 (ref 4.5–8.0)
Protein: NEGATIVE
RBC,UR: NONE SEEN /HPF (ref 0–5)
Specific Gravity: 1.019 (ref 1.003–1.030)
Squamous Epithelial: 2
WBC UR: 1 /HPF (ref 0–5)

## 2014-02-10 LAB — ACETAMINOPHEN LEVEL: Acetaminophen: <2 ug/mL

## 2014-02-10 LAB — TSH: Thyroid Stimulating Horm: 0.59 u[IU]/mL

## 2014-02-10 LAB — SALICYLATE LEVEL: Salicylates, Serum: <1.7 mg/dL

## 2014-02-10 LAB — ETHANOL
Ethanol %: <0.003 % (ref 0.000–0.080)
Ethanol: <3 mg/dL

## 2014-02-11 ENCOUNTER — Inpatient Hospital Stay: Payer: Self-pay | Admitting: Psychiatry

## 2014-02-11 LAB — CBC WITH DIFFERENTIAL/PLATELET
Basophil #: 0 10*3/uL (ref 0.0–0.1)
Basophil %: 0.2 %
Eosinophil #: 0 10*3/uL (ref 0.0–0.7)
Eosinophil %: 0 %
HCT: 42 % (ref 35.0–47.0)
HGB: 13.4 g/dL (ref 12.0–16.0)
Lymphocyte #: 0.5 10*3/uL — ABNORMAL LOW (ref 1.0–3.6)
Lymphocyte %: 5.5 %
MCH: 27.9 pg (ref 26.0–34.0)
MCHC: 31.9 g/dL — ABNORMAL LOW (ref 32.0–36.0)
MCV: 88 fL (ref 80–100)
Monocyte #: 0.2 x10 3/mm (ref 0.2–0.9)
Monocyte %: 2 %
Neutrophil #: 7.6 10*3/uL — ABNORMAL HIGH (ref 1.4–6.5)
Neutrophil %: 92.3 %
Platelet: 168 10*3/uL (ref 150–440)
RBC: 4.8 10*6/uL (ref 3.80–5.20)
RDW: 14.2 % (ref 11.5–14.5)
WBC: 8.2 10*3/uL (ref 3.6–11.0)

## 2014-02-11 LAB — BASIC METABOLIC PANEL
Anion Gap: 3 — ABNORMAL LOW (ref 7–16)
BUN: 9 mg/dL (ref 7–18)
Calcium, Total: 8.7 mg/dL (ref 8.5–10.1)
Chloride: 113 mmol/L — ABNORMAL HIGH (ref 98–107)
Co2: 23 mmol/L (ref 21–32)
Creatinine: 0.9 mg/dL (ref 0.60–1.30)
EGFR (African American): >60
EGFR (Non-African Amer.): >60
Glucose: 111 mg/dL — ABNORMAL HIGH (ref 65–99)
Osmolality: 277 (ref 275–301)
Potassium: 4.3 mmol/L (ref 3.5–5.1)
Sodium: 139 mmol/L (ref 136–145)

## 2014-02-11 LAB — HEMOGLOBIN A1C: Hemoglobin A1C: 6.1 % (ref 4.2–6.3)

## 2014-03-11 ENCOUNTER — Other Ambulatory Visit: Payer: Self-pay

## 2014-03-11 LAB — COMPREHENSIVE METABOLIC PANEL
Albumin: 3.2 g/dL — ABNORMAL LOW (ref 3.4–5.0)
Alkaline Phosphatase: 95 U/L
Anion Gap: 6 — ABNORMAL LOW (ref 7–16)
BUN: 15 mg/dL (ref 7–18)
Bilirubin,Total: 0.2 mg/dL (ref 0.2–1.0)
Calcium, Total: 8.5 mg/dL (ref 8.5–10.1)
Chloride: 108 mmol/L — ABNORMAL HIGH (ref 98–107)
Co2: 27 mmol/L (ref 21–32)
Creatinine: 0.85 mg/dL (ref 0.60–1.30)
EGFR (African American): >60
EGFR (Non-African Amer.): >60
Glucose: 94 mg/dL (ref 65–99)
Osmolality: 282 (ref 275–301)
Potassium: 3.8 mmol/L (ref 3.5–5.1)
SGOT(AST): 10 U/L — ABNORMAL LOW (ref 15–37)
SGPT (ALT): 20 U/L (ref 12–78)
Sodium: 141 mmol/L (ref 136–145)
Total Protein: 6.7 g/dL (ref 6.4–8.2)

## 2014-03-11 LAB — CBC WITH DIFFERENTIAL/PLATELET
Basophil #: 0 10*3/uL (ref 0.0–0.1)
Basophil %: 0.5 %
Eosinophil #: 0.1 10*3/uL (ref 0.0–0.7)
Eosinophil %: 1.1 %
HCT: 40.2 % (ref 35.0–47.0)
HGB: 12.8 g/dL (ref 12.0–16.0)
Lymphocyte #: 1.2 10*3/uL (ref 1.0–3.6)
Lymphocyte %: 14.2 %
MCH: 27.3 pg (ref 26.0–34.0)
MCHC: 31.8 g/dL — ABNORMAL LOW (ref 32.0–36.0)
MCV: 86 fL (ref 80–100)
Monocyte #: 0.7 x10 3/mm (ref 0.2–0.9)
Monocyte %: 7.8 %
Neutrophil #: 6.7 10*3/uL — ABNORMAL HIGH (ref 1.4–6.5)
Neutrophil %: 76.4 %
Platelet: 203 10*3/uL (ref 150–440)
RBC: 4.68 10*6/uL (ref 3.80–5.20)
RDW: 14.2 % (ref 11.5–14.5)
WBC: 8.9 10*3/uL (ref 3.6–11.0)

## 2014-03-11 LAB — SEDIMENTATION RATE: Erythrocyte Sed Rate: 20 mm/hr (ref 0–20)

## 2014-03-11 LAB — TSH: Thyroid Stimulating Horm: 0.634 u[IU]/mL

## 2014-03-24 ENCOUNTER — Ambulatory Visit: Payer: Self-pay | Admitting: Internal Medicine

## 2014-04-15 ENCOUNTER — Ambulatory Visit: Payer: No Typology Code available for payment source | Admitting: General Surgery

## 2014-05-29 ENCOUNTER — Encounter: Payer: Self-pay | Admitting: *Deleted

## 2014-06-30 ENCOUNTER — Encounter: Payer: Self-pay | Admitting: General Surgery

## 2014-08-25 ENCOUNTER — Ambulatory Visit: Payer: Self-pay

## 2014-08-25 LAB — CBC WITH DIFFERENTIAL/PLATELET
Basophil #: 0 10*3/uL (ref 0.0–0.1)
Basophil %: 0.4 %
Eosinophil #: 0.1 10*3/uL (ref 0.0–0.7)
Eosinophil %: 0.8 %
HCT: 42.8 % (ref 35.0–47.0)
HGB: 13.7 g/dL (ref 12.0–16.0)
Lymphocyte #: 1.3 10*3/uL (ref 1.0–3.6)
Lymphocyte %: 16.9 %
MCH: 28 pg (ref 26.0–34.0)
MCHC: 32.1 g/dL (ref 32.0–36.0)
MCV: 87 fL (ref 80–100)
Monocyte #: 0.4 x10 3/mm (ref 0.2–0.9)
Monocyte %: 5.4 %
Neutrophil #: 5.8 10*3/uL (ref 1.4–6.5)
Neutrophil %: 76.5 %
Platelet: 206 10*3/uL (ref 150–440)
RBC: 4.91 10*6/uL (ref 3.80–5.20)
RDW: 14 % (ref 11.5–14.5)
WBC: 7.6 10*3/uL (ref 3.6–11.0)

## 2014-08-25 LAB — COMPREHENSIVE METABOLIC PANEL
Albumin: 3.3 g/dL — ABNORMAL LOW (ref 3.4–5.0)
Alkaline Phosphatase: 84 U/L
Anion Gap: 5 — ABNORMAL LOW (ref 7–16)
BUN: 18 mg/dL (ref 7–18)
Bilirubin,Total: 0.2 mg/dL (ref 0.2–1.0)
Calcium, Total: 8.5 mg/dL (ref 8.5–10.1)
Chloride: 107 mmol/L (ref 98–107)
Co2: 28 mmol/L (ref 21–32)
Creatinine: 0.82 mg/dL (ref 0.60–1.30)
EGFR (African American): >60
EGFR (Non-African Amer.): >60
Glucose: 115 mg/dL — ABNORMAL HIGH (ref 65–99)
Osmolality: 282 (ref 275–301)
Potassium: 4.1 mmol/L (ref 3.5–5.1)
SGOT(AST): 17 U/L (ref 15–37)
SGPT (ALT): 26 U/L
Sodium: 140 mmol/L (ref 136–145)
Total Protein: 7 g/dL (ref 6.4–8.2)

## 2014-08-25 LAB — TSH: Thyroid Stimulating Horm: 0.381 u[IU]/mL — ABNORMAL LOW

## 2014-08-25 LAB — FOLATE: Folic Acid: 5.1 ng/mL (ref 3.1–17.5)

## 2014-08-25 LAB — T4, FREE: Free Thyroxine: 0.87 ng/dL (ref 0.76–1.46)

## 2014-08-27 LAB — HEMOGLOBIN A1C: Hemoglobin A1C: 5.6 % (ref 4.2–6.3)

## 2014-12-14 ENCOUNTER — Other Ambulatory Visit
Admit: 2014-12-14 | Disposition: A | Discharge status: Home / Self Care | Payer: Self-pay | Attending: Nurse Practitioner | Admitting: Nurse Practitioner

## 2014-12-14 LAB — CBC WITH DIFFERENTIAL/PLATELET
Basophil #: 0 10*3/uL (ref 0.0–0.1)
Basophil %: 0.8 %
Eosinophil #: 0.1 10*3/uL (ref 0.0–0.7)
Eosinophil %: 2.2 %
HCT: 41.1 % (ref 35.0–47.0)
HGB: 13 g/dL (ref 12.0–16.0)
Lymphocyte #: 1.4 10*3/uL (ref 1.0–3.6)
Lymphocyte %: 27.6 %
MCH: 27 pg (ref 26.0–34.0)
MCHC: 31.6 g/dL — ABNORMAL LOW (ref 32.0–36.0)
MCV: 86 fL (ref 80–100)
Monocyte #: 0.5 x10 3/mm (ref 0.2–0.9)
Monocyte %: 9.7 %
Neutrophil #: 3 10*3/uL (ref 1.4–6.5)
Neutrophil %: 59.7 %
Platelet: 184 10*3/uL (ref 150–440)
RBC: 4.8 10*6/uL (ref 3.80–5.20)
RDW: 14 % (ref 11.5–14.5)
WBC: 5 10*3/uL (ref 3.6–11.0)

## 2014-12-14 LAB — TSH: Thyroid Stimulating Horm: 0.719 u[IU]/mL

## 2014-12-14 LAB — SEDIMENTATION RATE: Erythrocyte Sed Rate: 21 mm/hr — ABNORMAL HIGH (ref 0–20)

## 2014-12-14 LAB — T4, FREE: Free Thyroxine: 0.81 ng/dL

## 2014-12-15 LAB — CLOSTRIDIUM DIFFICILE(ARMC)

## 2014-12-18 LAB — STOOL CULTURE

## 2014-12-20 NOTE — Consult Note (Signed)
Brief Consult Note: Diagnosis: MDD.   Patient was seen by consultant.   Consult note dictated.   Recommend further assessment or treatment.   Discussed with Attending MD.   Comments: Status post overdose. Will admit to psychiatry.  Electronic Signatures: Kristine Linea (MD)  (Signed 16-Jun-15 11:23)  Authored: Brief Consult Note   Last Updated: 16-Jun-15 11:23 by Kristine Linea (MD)

## 2014-12-20 NOTE — Consult Note (Signed)
PATIENT NAME:  Brittany Huynh, Brittany Huynh MR#:  [id number] DATE OF BIRTH:  March 12, 1966  DATE OF CONSULTATION:  02/11/2014  REFERRING PHYSICIAN:  Enid Baas, MD CONSULTING PHYSICIAN:  Natayla Cadenhead B. Yvette Loveless, MD  REASON FOR CONSULTATION: To evaluate the patient after a suicide attempt.   IDENTIFYING DATA: [name] is a 49 year old female with history of depression.   CHIEF COMPLAINT: "I don't want to be here no more."  HISTORY OF PRESENT ILLNESS: [name] has a history of depression that has been treated with Remeron in the past with success but lately with SSRI the name the patient does not remember. She is no longer able to afford the Remeron. She became increasingly depressed over the past several months. She is under considerable stress. 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 She is not well herself. She was diagnosed with sarcoid and oftentimes has to take time off from work and constantly worries that she will lose the job. She lives with 2 of her daughters, a 79 year old and a 53 year old. The 49 year old especially has been disrespectful, does not contribute to the household, and leaves the  64-year-old grandson for the patient to take care of. Last Sunday it was her birthday and apparently the patient had someone over, someone with whom she is romantically involved, I believe, and her older daughter made a big scene about having this person over. The patient felt  disrespected, tired and sad and overdosed on multiple medications. It was a bottle of Ambien, we do not know how many pills, and Lunesta that was just prescribed and she took 30 pills. She also was positive for barbiturates, which suggests that she could have taken Fioricet as well. The patient was admitted to the medical floor for observation according to advice given by poison control. I was asked to consult on her yesterday, but she was too sleepy. Apparently the  patient took medication Sunday night and her family found her sleeping Monday afternoon. They could not wake her up. They were concerned and brought her to the hospital. The patient is extremely tearful during the interview. Her sister is in the room. The patient is not happy to be alive and really sees no solution to her problems. She would never ask her 64 year old daughter to leave as she worries about her grandson; however, she clearly is unable to support her whole family and pay for a 3 bedroom apartment. She has been behind on her bills. The patient reports poor sleep, decreased appetite, anhedonia, feeling of guilt, hopelessness, worthlessness, low energy and concentration, social isolation, crying spells, and suicidal ideation leading to a serious suicide attempt. She denies psychotic symptoms. Denies symptoms suggestive of bipolar mania. She does not does drink, use prescription pills or illicit substances.   PAST PSYCHIATRIC HISTORY: She has never been hospitalized in psychiatry, never attempted suicide but did have thoughts of suicide on and off in the past, at least for the last 2 months. Her sister points out that the first time she learned that the patient has thoughts of hurting herself was about 2 months ago when she talked to her on the phone   FAMILY PSYCHIATRIC HISTORY: There is an aunt who has a history of psychosis and a cousin with depression. There were no completed suicides.  PAST MEDICAL HISTORY: Diabetes, diet-controlled, migraine headaches, and arthritis.  ALLERGIES: ONIONS AND ADHESIVE TAPE.   SOCIAL HISTORY: She is not married. 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 She lives with her 2 daughters and a grandson. She has a supportive family including her sister and her father. She also has some close friends. She is not a smoker.  REVIEW OF SYSTEMS: CONSTITUTIONAL: No fevers or chills. Positive for some weight gain. This is from prednisone treatment for sarcoid.  EYES: No double or  blurred vision.  ENT: No hearing loss.  RESPIRATORY: No shortness of breath or cough.  CARDIOVASCULAR: No chest pain or orthopnea.  GASTROINTESTINAL: No abdominal pain, nausea, vomiting, or diarrhea.  GENITOURINARY: No incontinence or frequency.  ENDOCRINE: No heat or cold intolerance.  LYMPHATIC: No anemia or easy bruising.  INTEGUMENTARY: No acne or rash.  MUSCULOSKELETAL: No muscle or joint pain.  NEUROLOGIC: No tingling or weakness.  PSYCHIATRIC: See history of present illness for details.  PHYSICAL EXAMINATION: VITAL SIGNS: Blood pressure 108/68, pulse 72, respirations 20, temperature 98.  GENERAL: This is well-developed, middle-aged female, very tearful.  HEENT: The pupils are equal, round, and reactive to light. Sclerae are anicteric.  NECK: Supple. No thyromegaly.  LUNGS: Clear to auscultation. No dullness to percussion.  HEART: Regular rhythm and rate. No murmurs, rubs, or gallops.  ABDOMEN: Soft, nontender, nondistended. Positive bowel sounds.  MUSCULOSKELETAL: Normal muscle strength in all extremities.  SKIN: No rashes or bruises.  LYMPHATIC: No cervical adenopathy.  NEUROLOGIC: Cranial nerves II through XII are intact.   DIAGNOSTIC DATA: Chemistries are within normal limits. Blood alcohol level is zero. LFTs within normal limits. TSH 0.59. Urine tox screen positive for barbiturates. CBC within normal limits. Urinalysis is not suggestive of urinary tract infection. Serum acetaminophen and salicylates are low. Urine pregnancy test is negative.   EKG: Normal sinus rhythm, possible left atrial enlargement, borderline EKG.   MENTAL STATUS EXAMINATION: The patient is alert and oriented to person, place, time and situation. She is pleasant, polite and cooperative but extremely tearful throughout the interview. She is well groomed. She wears hospital gowns. She maintains limited eye contact. Her speech is very soft. There is poverty of speech. Mood is depressed with tearful affect.  Thought process is logical and goal oriented. Thought content: She denies suicidal or homicidal ideation, but was admitted after a suicide attempt by medication overdose. The patient left a 4 page long suicide letter. There are no thoughts of hurting others. There are no delusions or paranoia. There are no auditory or visual hallucinations. Her cognition is grossly intact. She registers 3 out of 3 and recalls 3 out of 3 objects after 5 minutes. She can spell "world" forwards and backwards. She knows the current president. Her long and short-term memory are intact. She is of average or above average intelligence and fund of knowledge. Her insight and judgment are limited.   SUICIDE RISK ASSESSMENT: This is a patient with a history of poorly treated depression who overdosed on medication in the context of severe social stressors. She is at increased risk of suicide.   INITIAL DIAGNOSES:  AXIS I: Major depressive disorder, recurrent, severe.  AXIS II: Deferred.  AXIS III: Sarcoid, migraine headaches. AXIS IV: Mental illness, access to care, financial, occupational, family conflict, primary support. AXIS V: Global assessment of functioning 25.   PLAN:  1.  The patient is on IVC.  2.  She will be transferred to psychiatry as soon as a bed is available. Please call our intake nurse.    ____________________________ Ellin Goodie. Jennet Maduro, MD jbp:[id number] D: 02/11/2014 15:59:25 ET T: 02/11/2014 16:22:24 ET JOB#: [id number]  cc: Braulio Conte  B. Jennet Maduro, MD, <Dictator> Shari Prows MD ELECTRONICALLY SIGNED 03/12/2014 4:45

## 2014-12-20 NOTE — H&P (Signed)
PATIENT NAME:  Brittany Huynh, Brittany Huynh MR#:  [id number] DATE OF BIRTH:  1966-03-16  DATE OF ADMISSION:  02/10/2014  ADMITTING PHYSICIAN: Gladstone Lighter, MD  PRIMARY CARE PHYSICIAN: Dr. Clayborn Bigness   CHIEF COMPLAINT: Overdose.   HISTORY OF PRESENT ILLNESS: Brittany Huynh is a 49 year old African American female with a past medical history significant for panic attacks, insomnia and migraine headaches who works for [employer], was brought in by ambulance secondary to lethargy and noted to have overdosed on Lunesta and Ambien.  The patient lives with 2 of her daughters.  She was seen last night going to bed, was doing fine. This morning she was not waking up and daughter went in to check on her in late afternoon and found that she was lethargic and found down.  The patient had Lunesta 1 mg bottle filled for 30 days, which was completely empty. It was just filled yesterday and she had some old Ambien 10 mg tablets from 11/2013; not sure how many pills were left over.  The patient is arousable at this time but mostly sleeping. The poison center was contacted and they advised medical observation for 24 hours, especially for symptoms of CNS depression or hypertension and heart rate monitoring. The patient's vitals have been stable so for. The patient has left a 4-page suicide note, so this was a planned suicide. However, the patient's best friend at bedside tells me that the patient has never discussed being depressed or any suicide plans in the past.  This is the patient's first suicide attempt.   PAST MEDICAL HISTORY: 1.  Panic attacks.  2.  Insomnia.  3.  Migraines.   PAST SURGICAL HISTORY:  Hysterectomy and Chamberlain procedure on her chest for her lungs.   ALLERGIES:  No known drug allergies.  CURRENT HOME MEDICATIONS:   1.  Lunesta 1 mg p.o. daily, just filled yesterday. 2.  Lyrica 50 mg p.o. b.i.d.  3.  Naproxen 550 mg p.o. b.i.d.  4.  Sertraline 150 mg p.o. daily.  5.  Zolpidem 10 mg p.o.  at bedtime, which is an old prescription.   SOCIAL HISTORY: Lives at home with 2 daughters and a grandson, and no smoking or alcohol use. Works with [employer].  FAMILY HISTORY: Significant for diabetes running in the family.  REVIEW OF SYSTEMS:  Difficult to be obtained as patient is very lethargic at this time.  PHYSICAL EXAMINATION: VITAL SIGNS: Temperature 98.5 degrees Fahrenheit, pulse 96, respirations 14, blood pressure 119/77. Pulse ox 100% on 2 liters nasal cannula.  GENERAL: Well-built, well-nourished female lying in bed, not in any acute distress.  HEENT: Normocephalic, atraumatic. Pupils equal, round, reacting to light. She has discoloration which is chronic on both her upper eyelids and slightly puffy lower eyelids. Bilaterally, extraocular movements are intact.  Oropharynx is clear without erythema, mass or exudates.  NECK: Supple. No thyromegaly, JVD or carotid bruits. No lymphadenopathy. Normal range of motion without pain. However, the patient has complained of difficulty swallowing, even sips of water that just started when she woke up. She did not have the symptoms before the overdose. LUNGS: Clear to auscultation bilaterally. No wheeze or crackles. No use of accessory muscles for breathing.  CARDIOVASCULAR: S1, S2, regular rate and rhythm. No murmurs, rubs or gallops.  ABDOMEN: Soft, nontender, nondistended. No hepatosplenomegaly. Normal bowel sounds.  EXTREMITIES: No pedal edema. No clubbing or cyanosis; 2+ dorsalis pedis pulses palpable bilaterally.  SKIN: No acne, rash or lesions.  LYMPHATICS: No cervical or inguinal lymphadenopathy.  NEUROLOGICAL:  The patient is easily arousable, able to move all 4 extremities in bed. She is following commands but drifting right back to sleep.  PSYCHOLOGIC: Seems to be alert and oriented once we can arouse her.  LABORATORY DATA:  Sodium 140, potassium 3.6, chloride 106, bicarb 25, BUN 11, creatinine 0.86, glucose 70, calcium of  8.7.   ALT 18, AST 18, alk phos 87, total bili 0.4 and albumin of 3.0.   WBC 6.0, hemoglobin 14.3, hematocrit 45.6, platelet count 147. TSH 4.46, salicylate level 1.7. Urine tox screen positive for barbiturates. Urinalysis negative for any infection. Alcohol level is negative.  Acetaminophen level is negative.  ASSESSMENT AND PLAN: This is a 49 year old female with history of anxiety, panic attacks, insomnia and migraines brought in after overdose on Ambien and Lunesta.  1.  Drug overdose. First suicide attempt , works for [employer]. She is lethargic, arousable. Lunesta 1 mg 30 tablets overdose and Ambien 10 mg unknown tablets. Watch for central nervous system depression, appears stable now. Admit under observation. Suicide precaution.  Schedule urgent psychiatric consult.  2.  Throat pain, possibly pill-induced esophagitis as the patient tried to force all pills and now complains of sore throat when she woke up, unable to swallow sips of water comfortably. Recheck again in morning. Put her on liquid diet. If not improving, will need gastroenterology evaluation.  3.  Major depression. Psychiatric consult.  4.  Panic attacks.  Hold anxiety medications for tonight, as she is sleepy.  CODE STATUS: FULL CODE:  TIME SPENT ON ADMISSION:  50 minutes.    ____________________________ Gladstone Lighter, MD rk:ce D: 02/10/2014 18:55:30 ET T: 02/10/2014 19:20:55 ET JOB#: [id number]  cc: Gladstone Lighter, MD, <Dictator> Lavera Guise, MD Gladstone Lighter MD ELECTRONICALLY SIGNED 02/17/2014 15:57

## 2014-12-20 NOTE — Discharge Summary (Signed)
PATIENT NAME:  Brittany Huynh, Brittany Huynh MR#:  [id number] DATE OF BIRTH:  19-Jul-1966  DATE OF ADMISSION:  02/10/2014 DATE OF DISCHARGE:  02/11/2014  PRIMARY CARE PHYSICIAN: Dr. Beverely Risen.   FINAL DIAGNOSES:  1.  Drug overdose, suicide attempt, depression and anxiety.  2.  Dysphagia, which is chronic.  3.  Sarcoidosis.   MEDICATIONS ON DISCHARGE TO PSYCHIATRY: Prednisone 10 mg daily and Protonix 40 mg daily. Psychiatric medications as per Dr. Jennet Huynh.   DIET: Regular diet, regular consistency.   ACTIVITY: As tolerated.   FOLLOWUP: In 1 to 2 days with Dr. Jennet Huynh on the psychiatry floor.   HISTORY OF PRESENT ILLNESS: The patient was admitted February 10, 2014 as an observation and discharged to psychiatry February 11, 2014. The patient came in after an overdose. She was lethargic in the Emergency Room. Lunesta and Ambien was taken. She did have some throat pain.  LABORATORY AND RADIOLOGICAL DATA DURING THE HOSPITAL COURSE: Included an EKG that showed normal sinus rhythm, left atrial enlargement. Pregnancy test negative. Urinalysis negative. Urine toxicology positive for barbiturates. TSH 0.59, salicylates less than 1.7. White blood cell count 6.0, hemoglobin and hematocrit 14.5 and 45.6, platelet count of 147. Ethanol level negative. Glucose 70, BUN 11, creatinine 0.86, sodium 140, potassium 3.6, chloride 106, CO2 25, calcium 8.7. Liver function tests normal range. Hemoglobin A1c 6.1. Creatinine upon discharge 0.9. White blood cell count 8.2, hemoglobin 13.4.  HOSPITAL COURSE PER PROBLEM LIST:  1.  Drug overdose, suicide attempt, depression, anxiety. The patient was seen in consultation by Dr. Jennet Huynh who will take to the psychiatry floor for further observation. All  psychiatric medications will be as per Dr. Jennet Huynh. This was a suicide attempt. The patient states that she did have a lot of stress in her life. Her children are disrespectful and has a lot of stressors including financial.  2.   Dysphagia. As per patient, this is chronic. No further work-up as inpatient. Will put on Protonix and stop Naprosyn.  3.  Sarcoidosis on chronic prednisone. Continue 10 mg daily.   TIME SPENT ON DISCHARGE: 35 minutes.  ____________________________ Herschell Dimes. Renae Gloss, MD rjw:[id number] D: 02/11/2014 14:01:30 ET T: 02/11/2014 14:08:47 ET JOB#: [id number]  cc: Herschell Dimes. Renae Gloss, MD, <Dictator> Lyndon Code, MD Salley Scarlet MD ELECTRONICALLY SIGNED 02/14/2014 16:17

## 2014-12-20 NOTE — Consult Note (Signed)
PATIENT NAME:  Brittany Huynh, Brittany Huynh [id number] OF BIRTH:  1965/08/30 OF CONSULTATION:  02/11/2014 PHYSICIAN:  Brittany Huynh, MDPHYSICIAN:  Brittany Goodie. Carmilla Granville, MD FOR CONSULTATION: To evaluate the patient after a suicide attempt.  DATA: Brittany Huynh is a 49 year old female with history of depression.  COMPLAINT: "I don?t want to be here no more." OF PRESENT ILLNESS: Brittany Huynh has a history of depression that has been treated with Remeron in the past with success but lately with SSRI the name the patient does not remember. She is no longer able to afford the Remeron. She became increasingly depressed over the past several months. She is under considerable stress. 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 She is not well herself. She was diagnosed with sarcoid and oftentimes has to take time off from work and constantly worries that she will lose the job. She lives with 2 of her daughters, a 13 year old and a 67 year old. The 49 year old especially has been disrespectful, does not contribute to the household, and leaves the  101-year-old grandson for the patient to take care of. Last Sunday it was her birthday and apparently the patient had someone over, someone with whom she is romantically involved, I believe, and her older daughter made a big scene about having this person over. The patient felt  disrespected, tired and sad and overdosed on multiple medications. It was a bottle of Ambien, we do not know how many pills, and Lunesta that was just prescribed and she took 30 pills. She also was positive for barbiturates, which suggests that she could have taken Fioricet as well. The patient was admitted to the medical floor for observation according to advice given by poison control. I was asked to consult on her yesterday, but she was too sleepy. Apparently the patient took medication Sunday night and her family found her sleeping Monday afternoon.  They could not wake her up. They were concerned and brought her to the hospital. The patient is extremely tearful during the interview. Her sister is in the room. The patient is not happy to be alive and really sees no solution to her problems. She would never ask her 37 year old daughter to leave as she worries about her grandson; however, she clearly is unable to support her whole family and pay for a 3 bedroom apartment. She has been behind on her bills. The patient reports poor sleep, decreased appetite, anhedonia, feeling of guilt, hopelessness, worthlessness, low energy and concentration, social isolation, crying spells, and suicidal ideation leading to a serious suicide attempt. She denies psychotic symptoms. Denies symptoms suggestive of bipolar mania. She does not does drink, use prescription pills or illicit substances.  PSYCHIATRIC HISTORY: She has never been hospitalized in psychiatry, never attempted suicide but did have thoughts of suicide on and off in the past, at least for the last 2 months. Her sister points out that the first time she learned that the patient has thoughts of hurting herself was about 2 months ago when she talked to her on the phone  PSYCHIATRIC HISTORY: There is an aunt who has a history of psychosis and a cousin with depression. There were no completed suicides. MEDICAL HISTORY: Diabetes, diet-controlled, migraine headaches, and arthritis. ONIONS AND ADHESIVE TAPE.  HISTORY: She is not married. Works as a Lawyer at [employer]. She lives with her 2 daughters and a grandson. She has a supportive family including her sister and her father. She also has some close  friends. She is not a smoker. OF SYSTEMS:No fevers or chills. Positive for some weight gain. This is from prednisone treatment for sarcoid. No double or blurred vision. No hearing loss. No shortness of breath or cough. No chest pain or orthopnea. No abdominal pain, nausea, vomiting, or diarrhea. No incontinence or  frequency. No heat or cold intolerance. No anemia or easy bruising. No acne or rash. No muscle or joint pain. No tingling or weakness. See history of present illness for details. EXAMINATION:SIGNS: Blood pressure 108/68, pulse 72, respirations 20, temperature 98. This is well-developed, middle-aged female, very tearful. The pupils are equal, round, and reactive to light. Sclerae are anicteric. Supple. No thyromegaly. Clear to auscultation. No dullness to percussion. Regular rhythm and rate. No murmurs, rubs, or gallops. Soft, nontender, nondistended. Positive bowel sounds. Normal muscle strength in all extremities. No rashes or bruises. No cervical adenopathy. Cranial nerves II through XII are intact.  DATA: Chemistries are within normal limits. Blood alcohol level is zero. LFTs within normal limits. TSH 0.59. Urine tox screen positive for barbiturates. CBC within normal limits. Urinalysis is not suggestive of urinary tract infection. Serum acetaminophen and salicylates are low. Urine pregnancy test is negative.  Normal sinus rhythm, possible left atrial enlargement, borderline EKG.  STATUS EXAMINATION: The patient is alert and oriented to person, place, time and situation. She is pleasant, polite and cooperative but extremely tearful throughout the interview. She is well groomed. She wears hospital gowns. She maintains limited eye contact. Her speech is very soft. There is poverty of speech. Mood is depressed with tearful affect. Thought process is logical and goal oriented. Thought content: She denies suicidal or homicidal ideation, but was admitted after a suicide attempt by medication overdose. The patient left a 4 page long suicide letter. There are no thoughts of hurting others. There are no delusions or paranoia. There are no auditory or visual hallucinations. Her cognition is grossly intact. She registers 3 out of 3 and recalls 3 out of 3 objects after 5 minutes. She can spell "world" forwards and  backwards. She knows the current president. Her long and short-term memory are intact. She is of average or above average intelligence and fund of knowledge. Her insight and judgment are limited.  RISK ASSESSMENT: This is a patient with a history of poorly treated depression who overdosed on medication in the context of severe social stressors. She is at increased risk of suicide.  DIAGNOSES: I: Major depressive disorder, recurrent, severe. II: Deferred. III: Sarcoid, migraine headaches.IV: Mental illness, access to care, financial, occupational, family conflict, primary support.V: Global assessment of functioning 25.   The patient is on IVC.  She will be transferred to psychiatry as soon as a bed is available. Please call our intake nurse.     Electronic Signatures: Kristine Linea (MD)  (Signed on 17-Jun-15 18:48)  Authored  Last Updated: 17-Jun-15 18:48 by Kristine Linea (MD)

## 2015-01-06 NOTE — H&P (Signed)
PATIENT NAME:  Shereen, Marton SUJATA MAINES [id number] OF BIRTH:  1965-10-31 OF ADMISSION:  02/12/2014 PHYSICIAN:  Enid Baas, MDPHYSICIAN:  Ellin Goodie. Havard Radigan, MD DATA: Ms. Ericsson is a 49 year old female with history of depression.  COMPLAINT: "I don?t want to be here no more." OF PRESENT ILLNESS: Ms. Hamre has a history of depression that has been treated with Remeron in the past with success but lately with SSRI the name the patient does not remember. She is no longer able to afford the Remeron. She became increasingly depressed over the past several months. She is under considerable stress. 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 She is not well herself. She was diagnosed with sarcoid and oftentimes has to take time off from work and constantly worries that she will lose the job. She lives with 2 of her daughters, a 9 year old and a 26 year old. The 49 year old especially has been disrespectful, does not contribute to the household, and leaves the  76-year-old grandson for the patient to take care of. Last Sunday it was her birthday and apparently the patient had someone over, someone with whom she is romantically involved, I believe, and her older daughter made a big scene about having this person over. The patient felt  disrespected, tired and sad and overdosed on multiple medications. It was a bottle of Ambien, we do not know how many pills, and Lunesta that was just prescribed and she took 30 pills. She also was positive for barbiturates, which suggests that she could have taken Fioricet as well. The patient was admitted to the medical floor for observation according to advice given by poison control. I was asked to consult on her yesterday, but she was too sleepy. Apparently the patient took medication Sunday night and her family found her sleeping Monday afternoon. They could not wake her up. They were concerned and brought her to the  hospital. The patient is extremely tearful during the interview. Her sister is in the room. The patient is not happy to be alive and really sees no solution to her problems. She would never ask her 3 year old daughter to leave as she worries about her grandson; however, she clearly is unable to support her whole family and pay for a 3 bedroom apartment. She has been behind on her bills. The patient reports poor sleep, decreased appetite, anhedonia, feeling of guilt, hopelessness, worthlessness, low energy and concentration, social isolation, crying spells, and suicidal ideation leading to a serious suicide attempt. She denies psychotic symptoms. Denies symptoms suggestive of bipolar mania. She does not does drink, use prescription pills or illicit substances.  PSYCHIATRIC HISTORY: She has never been hospitalized in psychiatry, never attempted suicide but did have thoughts of suicide on and off in the past, at least for the last 2 months. Her sister points out that the first time she learned that the patient has thoughts of hurting herself was about 2 months ago when she talked to her on the phone  PSYCHIATRIC HISTORY: There is an aunt who has a history of psychosis and a cousin with depression. There were no completed suicides. MEDICAL HISTORY: Diabetes, diet-controlled, migraine headaches, and arthritis. ONIONS AND ADHESIVE TAPE.  HISTORY: She is not married. Works as a Lawyer at [employer]. She lives with her 2 daughters and a grandson. She has a supportive family including her sister and her father. She also has some close friends. She is not a smoker. OF SYSTEMS:No fevers or chills.  Positive for some weight gain. This is from prednisone treatment for sarcoid. No double or blurred vision. No hearing loss. No shortness of breath or cough. No chest pain or orthopnea. No abdominal pain, nausea, vomiting, or diarrhea. No incontinence or frequency. No heat or cold intolerance. No anemia or easy bruising. No acne or  rash. No muscle or joint pain. No tingling or weakness. See history of present illness for details. EXAMINATION:SIGNS: Blood pressure 108/68, pulse 72, respirations 20, temperature 98. This is well-developed, middle-aged female, very tearful. The pupils are equal, round, and reactive to light. Sclerae are anicteric. Supple. No thyromegaly. Clear to auscultation. No dullness to percussion. Regular rhythm and rate. No murmurs, rubs, or gallops. Soft, nontender, nondistended. Positive bowel sounds. Normal muscle strength in all extremities. No rashes or bruises. No cervical adenopathy. Cranial nerves II through XII are intact.  DATA: Chemistries are within normal limits. Blood alcohol level is zero. LFTs within normal limits. TSH 0.59. Urine tox screen positive for barbiturates. CBC within normal limits. Urinalysis is not suggestive of urinary tract infection. Serum acetaminophen and salicylates are low. Urine pregnancy test is negative. EKG: Normal sinus rhythm, possible left atrial enlargement, borderline EKG.  STATUS EXAMINATION ON ADMISSION: The patient is alert and oriented to person, place, time and situation. She is pleasant, polite and cooperative but extremely tearful throughout the interview. She is well groomed. She wears hospital gowns. She maintains limited eye contact. Her speech is very soft. There is poverty of speech. Mood is depressed with tearful affect. Thought process is logical and goal oriented. Thought content: She denies suicidal or homicidal ideation, but was admitted after a suicide attempt by medication overdose. The patient left a 4 page long suicide letter. There are no thoughts of hurting others. There are no delusions or paranoia. There are no auditory or visual hallucinations. Her cognition is grossly intact. She registers 3 out of 3 and recalls 3 out of 3 objects after 5 minutes. She can spell "world" forwards and backwards. She knows the current president. Her long and short-term  memory are intact. She is of average or above average intelligence and fund of knowledge. Her insight and judgment are limited.  RISK ASSESSMENT ON ADMISSION: This is a patient with a history of poorly treated depression who overdosed on medication in the context of severe social stressors. She is at increased risk of suicide.  DIAGNOSES: I: Major depressive disorder, recurrent, severe. II: Deferred. III: Sarcoid, migraine headaches.IV: Mental illness, access to care, financial, occupational, family conflict, primary support.V: Global assessment of functioning on admission 25.   Suicidal ideation: The patient is able to contract for safety.   Mood: We restart Remeron that was helpful in the past.  Family conflict: Family meeting a bonus. Disposition: To home.    Electronic Signatures: Kristine Linea (MD)  (Signed on 17-Jun-15 18:53)  Authored  Last Updated: 17-Jun-15 18:53 by Kristine Linea (MD)

## 2015-03-25 ENCOUNTER — Other Ambulatory Visit: Payer: Self-pay | Admitting: Nurse Practitioner

## 2015-03-25 DIAGNOSIS — R109 Unspecified abdominal pain: Secondary | ICD-10-CM

## 2015-03-25 DIAGNOSIS — IMO0001 Reserved for inherently not codable concepts without codable children: Secondary | ICD-10-CM

## 2015-03-25 DIAGNOSIS — K219 Gastro-esophageal reflux disease without esophagitis: Secondary | ICD-10-CM

## 2015-03-25 DIAGNOSIS — R110 Nausea: Secondary | ICD-10-CM

## 2015-04-01 ENCOUNTER — Other Ambulatory Visit
Admission: RE | Admit: 2015-04-01 | Discharge: 2015-04-01 | Disposition: A | Discharge status: Home / Self Care | Payer: PRIVATE HEALTH INSURANCE | Source: Ambulatory Visit | Attending: Nurse Practitioner | Admitting: Nurse Practitioner

## 2015-04-01 DIAGNOSIS — R197 Diarrhea, unspecified: Secondary | ICD-10-CM | POA: Diagnosis present

## 2015-04-01 LAB — CBC
HCT: 43.4 % (ref 35.0–47.0)
Hemoglobin: 14.1 g/dL (ref 12.0–16.0)
MCH: 27.4 pg (ref 26.0–34.0)
MCHC: 32.4 g/dL (ref 32.0–36.0)
MCV: 84.4 fL (ref 80.0–100.0)
Platelets: 205 10*3/uL (ref 150–440)
RBC: 5.14 MIL/uL (ref 3.80–5.20)
RDW: 13.6 % (ref 11.5–14.5)
WBC: 6.3 10*3/uL (ref 3.6–11.0)

## 2015-04-01 LAB — COMPREHENSIVE METABOLIC PANEL
ALT: 13 U/L — ABNORMAL LOW (ref 14–54)
AST: 19 U/L (ref 15–41)
Albumin: 4.2 g/dL (ref 3.5–5.0)
Alkaline Phosphatase: 73 U/L (ref 38–126)
Anion gap: 7 (ref 5–15)
BUN: 11 mg/dL (ref 6–20)
CO2: 26 mmol/L (ref 22–32)
Calcium: 9.5 mg/dL (ref 8.9–10.3)
Chloride: 106 mmol/L (ref 101–111)
Creatinine, Ser: 0.8 mg/dL (ref 0.44–1.00)
GFR calc Af Amer: >60 mL/min (ref >60)
GFR calc non Af Amer: >60 mL/min (ref >60)
Glucose, Bld: 100 mg/dL — ABNORMAL HIGH (ref 65–99)
Potassium: 4.2 mmol/L (ref 3.5–5.1)
Sodium: 139 mmol/L (ref 135–145)
Total Bilirubin: 0.5 mg/dL (ref 0.3–1.2)
Total Protein: 7.7 g/dL (ref 6.5–8.1)

## 2015-04-04 LAB — STOOL CULTURE

## 2015-04-09 ENCOUNTER — Ambulatory Visit: Payer: No Typology Code available for payment source

## 2015-04-09 ENCOUNTER — Ambulatory Visit: Payer: No Typology Code available for payment source | Attending: Nurse Practitioner

## 2015-04-27 ENCOUNTER — Other Ambulatory Visit: Payer: Self-pay | Admitting: Physician Assistant

## 2015-04-27 ENCOUNTER — Ambulatory Visit
Admission: RE | Admit: 2015-04-27 | Discharge: 2015-04-27 | Disposition: A | Discharge status: Home / Self Care | Payer: PRIVATE HEALTH INSURANCE | Source: Ambulatory Visit | Attending: Physician Assistant | Admitting: Physician Assistant

## 2015-04-27 DIAGNOSIS — R059 Cough, unspecified: Secondary | ICD-10-CM

## 2015-04-27 DIAGNOSIS — R05 Cough: Secondary | ICD-10-CM | POA: Diagnosis not present

## 2015-05-06 ENCOUNTER — Other Ambulatory Visit: Payer: Self-pay | Admitting: Nurse Practitioner

## 2015-05-06 ENCOUNTER — Ambulatory Visit
Admission: RE | Admit: 2015-05-06 | Discharge: 2015-05-06 | Disposition: A | Discharge status: Home / Self Care | Payer: PRIVATE HEALTH INSURANCE | Source: Ambulatory Visit | Attending: Nurse Practitioner | Admitting: Nurse Practitioner

## 2015-05-06 DIAGNOSIS — S92151A Displaced avulsion fracture (chip fracture) of right talus, initial encounter for closed fracture: Secondary | ICD-10-CM | POA: Insufficient documentation

## 2015-05-06 DIAGNOSIS — M25571 Pain in right ankle and joints of right foot: Secondary | ICD-10-CM

## 2015-05-06 DIAGNOSIS — W1840XA Slipping, tripping and stumbling without falling, unspecified, initial encounter: Secondary | ICD-10-CM | POA: Insufficient documentation

## 2015-06-15 ENCOUNTER — Encounter: Payer: Self-pay | Admitting: Emergency Medicine

## 2015-06-15 ENCOUNTER — Emergency Department: Payer: PRIVATE HEALTH INSURANCE

## 2015-06-15 ENCOUNTER — Emergency Department
Admission: EM | Admit: 2015-06-15 | Discharge: 2015-06-15 | Disposition: A | Discharge status: Home / Self Care | Payer: PRIVATE HEALTH INSURANCE | Attending: Emergency Medicine | Admitting: Emergency Medicine

## 2015-06-15 DIAGNOSIS — R079 Chest pain, unspecified: Secondary | ICD-10-CM

## 2015-06-15 DIAGNOSIS — F419 Anxiety disorder, unspecified: Secondary | ICD-10-CM | POA: Insufficient documentation

## 2015-06-15 DIAGNOSIS — Z79899 Other long term (current) drug therapy: Secondary | ICD-10-CM | POA: Insufficient documentation

## 2015-06-15 DIAGNOSIS — Z791 Long term (current) use of non-steroidal anti-inflammatories (NSAID): Secondary | ICD-10-CM | POA: Diagnosis not present

## 2015-06-15 DIAGNOSIS — Z7951 Long term (current) use of inhaled steroids: Secondary | ICD-10-CM | POA: Diagnosis not present

## 2015-06-15 DIAGNOSIS — R55 Syncope and collapse: Secondary | ICD-10-CM | POA: Insufficient documentation

## 2015-06-15 HISTORY — DX: Unspecified asthma, uncomplicated: J45.909

## 2015-06-15 LAB — CBC WITH DIFFERENTIAL/PLATELET
Basophils Absolute: 0 10*3/uL (ref 0–0.1)
Basophils Relative: 0 %
Eosinophils Absolute: 0.1 10*3/uL (ref 0–0.7)
Eosinophils Relative: 1 %
HCT: 42.8 % (ref 35.0–47.0)
Hemoglobin: 14.1 g/dL (ref 12.0–16.0)
Lymphocytes Relative: 15 %
Lymphs Abs: 1.5 10*3/uL (ref 1.0–3.6)
MCH: 27.4 pg (ref 26.0–34.0)
MCHC: 32.9 g/dL (ref 32.0–36.0)
MCV: 83.4 fL (ref 80.0–100.0)
Monocytes Absolute: 0.6 10*3/uL (ref 0.2–0.9)
Monocytes Relative: 6 %
Neutro Abs: 8.1 10*3/uL — ABNORMAL HIGH (ref 1.4–6.5)
Neutrophils Relative %: 78 %
Platelets: 218 10*3/uL (ref 150–440)
RBC: 5.13 MIL/uL (ref 3.80–5.20)
RDW: 13.6 % (ref 11.5–14.5)
WBC: 10.4 10*3/uL (ref 3.6–11.0)

## 2015-06-15 LAB — COMPREHENSIVE METABOLIC PANEL
ALT: 13 U/L — ABNORMAL LOW (ref 14–54)
AST: 18 U/L (ref 15–41)
Albumin: 3.9 g/dL (ref 3.5–5.0)
Alkaline Phosphatase: 88 U/L (ref 38–126)
Anion gap: 6 (ref 5–15)
BUN: 13 mg/dL (ref 6–20)
CO2: 27 mmol/L (ref 22–32)
Calcium: 9.1 mg/dL (ref 8.9–10.3)
Chloride: 108 mmol/L (ref 101–111)
Creatinine, Ser: 0.85 mg/dL (ref 0.44–1.00)
GFR calc Af Amer: >60 mL/min (ref >60)
GFR calc non Af Amer: >60 mL/min (ref >60)
Glucose, Bld: 106 mg/dL — ABNORMAL HIGH (ref 65–99)
Potassium: 3.7 mmol/L (ref 3.5–5.1)
Sodium: 141 mmol/L (ref 135–145)
Total Bilirubin: <0.1 mg/dL — ABNORMAL LOW (ref 0.3–1.2)
Total Protein: 7.1 g/dL (ref 6.5–8.1)

## 2015-06-15 LAB — TROPONIN I: Troponin I: <0.03 ng/mL (ref <0.031)

## 2015-06-15 LAB — FIBRIN DERIVATIVES D-DIMER (ARMC ONLY): Fibrin derivatives D-dimer (ARMC): 571 — ABNORMAL HIGH (ref 0–499)

## 2015-06-15 MED ORDER — ONDANSETRON HCL 4 MG/2ML IJ SOLN
4.0000 mg | Freq: Once | INTRAMUSCULAR | Status: AC
  Administered 2015-06-15: 4 mg via INTRAVENOUS
  Filled 2015-06-15: qty 2

## 2015-06-15 MED ORDER — LORAZEPAM 2 MG/ML IJ SOLN
1.0000 mg | Freq: Once | INTRAMUSCULAR | Status: AC
  Administered 2015-06-15: 1 mg via INTRAVENOUS
  Filled 2015-06-15: qty 1

## 2015-06-15 MED ORDER — IOHEXOL 350 MG/ML SOLN
75.0000 mL | Freq: Once | INTRAVENOUS | Status: AC | PRN
  Administered 2015-06-15: 75 mL via INTRAVENOUS

## 2015-06-15 MED ORDER — SODIUM CHLORIDE 0.9 % IV BOLUS (SEPSIS)
500.0000 mL | Freq: Once | INTRAVENOUS | Status: AC
  Administered 2015-06-15: 500 mL via INTRAVENOUS

## 2015-06-15 MED ORDER — KETOROLAC TROMETHAMINE 30 MG/ML IJ SOLN
30.0000 mg | Freq: Once | INTRAMUSCULAR | Status: AC
  Administered 2015-06-15: 30 mg via INTRAVENOUS
  Filled 2015-06-15: qty 1

## 2015-06-15 NOTE — ED Provider Notes (Signed)
Time Seen: Approximately ----------------------------------------- 8:45 AM on 06/15/2015 -----------------------------------------   I have reviewed the triage notes  Chief Complaint: Loss of Consciousness   History of Present Illness: Brittany Huynh is a 49 y.o. female who presents with a syncopal episode. Patient was at work today when she started feeling lightheaded, nauseated, hot and sweaty she developed some chest pain after she developed some vomiting. Patient arrives with active emesis with a small amount. Chest pain is mainly left and right-sided at the base of the chest. She denies any back pain, jaw pain, arm pain. She states she was short of breath at the time of initiation of the symptoms and states that she felt anxious. She denies any melena but states she has had some loose stool over the last 5 days. Patient has a significant history of sarcoidosis. She is currently on prednisone. She is not aware of any fever or chills or productive cough.   Past Medical History  Diagnosis Date  . Shortness of breath     2011  . Sarcoidosis (HCC)   . Hernia 2011    chest wall  . Lung mass 2011  . Breast fibrocystic disorder 2013    Patient Active Problem List   Diagnosis Date Noted  . Fibrocystic breast disease 04/05/2013  . Breast screening 04/05/2013  . Lump or mass in breast 04/05/2013    Past Surgical History  Procedure Laterality Date  . Abdominal hysterectomy  2009  . Chamberlain procedure   2011  . Lung biopsy  2011  . Resection ribs extrapleural  [id number]    Past Surgical History  Procedure Laterality Date  . Abdominal hysterectomy  2009  . Chamberlain procedure   2011  . Lung biopsy  2011  . Resection ribs extrapleural  [id number]    Current Outpatient Rx  Name  Route  Sig  Dispense  Refill  . albuterol (PROVENTIL HFA;VENTOLIN HFA) 108 (90 BASE) MCG/ACT inhaler   Inhalation   Inhale 2 puffs into the lungs every 6 (six) hours as needed. Wheezing         .  aspirin-acetaminophen-caffeine (EXCEDRIN MIGRAINE) 250-250-65 MG per tablet   Oral   Take 1 tablet by mouth every 6 (six) hours as needed. Headache         . budesonide-formoterol (SYMBICORT) 160-4.5 MCG/ACT inhaler   Inhalation   Inhale 2 puffs into the lungs 2 (two) times daily.         . celecoxib (CELEBREX) 200 MG capsule   Oral   Take 200 mg by mouth 2 (two) times daily.         Marland Kitchen gabapentin (NEURONTIN) 100 MG capsule   Oral   Take 1 capsule by mouth 3 (three) times daily.         . hydrochlorothiazide (HYDRODIURIL) 25 MG tablet   Oral   Take 1 tablet by mouth daily.         . mirtazapine (REMERON) 15 MG tablet   Oral   Take 15 mg by mouth at bedtime.         Letta Pate DELICA LANCETS 33G MISC   Oral   Take 1 tablet by mouth daily.         . predniSONE (DELTASONE) 20 MG tablet   Oral   Take 1 tablet by mouth 2 (two) times daily.         Marland Kitchen rOPINIRole (REQUIP) 0.5 MG tablet   Oral   Take 0.5-1 mg by mouth at  bedtime.         . sertraline (ZOLOFT) 100 MG tablet   Oral   Take 1 tablet by mouth daily.         Marland Kitchen venlafaxine (EFFEXOR) 37.5 MG tablet   Oral   Take 1 tablet by mouth daily at 6 (six) AM.         . zolpidem (AMBIEN) 10 MG tablet   Oral   Take 1 tablet by mouth daily.           Allergies:  Onion and Adhesive  Family History: Family History  Problem Relation Age of Onset  . Liver cancer Mother   . Bone cancer Maternal Aunt   . Breast cancer Paternal Uncle   . Breast cancer Maternal Aunt     Social History: Social History  Substance Use Topics  . Smoking status: Never Smoker   . Smokeless tobacco: Never Used  . Alcohol Use: Yes     Comment: on occassion     Review of Systems:   10 point review of systems was performed and was otherwise negative:  Constitutional: No fever Eyes: No visual disturbances ENT: No sore throat, ear pain Cardiac: Chest pain mentioned above Respiratory: No shortness of breath,  wheezing, or stridor Abdomen: No abdominal pain, no vomiting, No diarrhea Endocrine: No weight loss, No night sweats Extremities: No peripheral edema, cyanosis Skin: No rashes, easy bruising Neurologic: No focal weakness, trouble with speech or swollowing Urologic: No dysuria, Hematuria, or urinary frequency   Physical Exam:  ED Triage Vitals  Enc Vitals Group     BP 06/15/15 0811 122/89 mmHg     Pulse Rate 06/15/15 0811 81     Resp --      Temp 06/15/15 0811 97.1 F (36.2 C)     Temp Source 06/15/15 0811 Oral     SpO2 06/15/15 0811 100 %     Weight 06/15/15 0811 150 lb (68.04 kg)     Height 06/15/15 0811 5\' 6"  (1.676 m)     Head Cir --      Peak Flow --      Pain Score 06/15/15 0815 4     Pain Loc --      Pain Edu? --      Excl. in GC? --     General: Awake , Alert , and Oriented times 3; GCS 15. Very anxious  Head: Normal cephalic , atraumatic Eyes: Pupils equal , round, reactive to light Nose/Throat: No nasal drainage, patent upper airway without erythema or exudate.  Neck: Supple, Full range of motion, No anterior adenopathy or palpable thyroid masses Lungs: Clear to ascultation without wheezes , rhonchi, or rales Heart: Regular rate, regular rhythm without murmurs , gallops , or rubs Abdomen: Soft, non tender without rebound, guarding , or rigidity; bowel sounds positive and symmetric in all 4 quadrants. No organomegaly .        Extremities: 2 plus symmetric pulses. No edema, clubbing or cyanosis Neurologic: normal ambulation, Motor symmetric without deficits, sensory intact Skin: warm, dry, no rashes Some diffuse chest wall discomfort  Labs:   All laboratory work was reviewed including any pertinent negatives or positives listed below:  Labs Reviewed  CBC WITH DIFFERENTIAL/PLATELET  COMPREHENSIVE METABOLIC PANEL  TROPONIN I  FIBRIN DERIVATIVES D-DIMER ([facility] ONLY)  URINALYSIS COMPLETEWITH MICROSCOPIC ([facility] ONLY)    EKG:  ED ECG REPORT I, 06/17/15,  the attending physician, personally viewed and interpreted this ECG.  Date: 06/15/2015 EKG  Time: 0811 Rate: 74 Rhythm: normal sinus rhythm QRS Axis: normal Intervals: normal ST/T Wave abnormalities: normal Conduction Disutrbances: none Narrative Interpretation: unremarkable    Radiology:     EXAM: CT ANGIOGRAPHY CHEST WITH CONTRAST  TECHNIQUE: Multidetector CT imaging of the chest was performed using the standard protocol during bolus administration of intravenous contrast. Multiplanar CT image reconstructions and MIPs were obtained to evaluate the vascular anatomy.  CONTRAST: 27mL OMNIPAQUE IOHEXOL 350 MG/ML SOLN  COMPARISON: 12/14/2013  FINDINGS: THORACIC INLET/BODY WALL:  Scarring in the left pectoralis minor neighboring an anterior left second rib resection. Surgical indication is unknown, no suspicious nodularity in this region.  MEDIASTINUM:  Normal heart size. No pericardial effusion. Calcified mediastinal lymph nodes. No enlarged noncalcified nodule seen. No evidence of pulmonary embolism. Negative aorta.  LUNG WINDOWS:  The right apical nodule with bandlike opacity extending towards the hilum has decreased in bulk, and now in 10 mm in maximal axial dimension (previously 13 mm when measured at the same level). A right upper lobe pulmonary nodule on series 6, image 64 is stable over multiple years (since at least 2013) measuring 8 mm. Nodular opacity in the posterior costophrenic sulcus on the right also shows an interval decrease in size, now 9 mm -previously 13 mm. A neighboring pulmonary nodule near the bronchovascular tree is essentially resolved compared to prior. No new or suspicious pulmonary nodules. Mild biapical lung scarring, presumably sarcoid related.  UPPER ABDOMEN:  No acute findings.  OSSEOUS:  Left anterior second rib resection. No acute fracture. No suspicious lytic or blastic lesions.  Review of the MIP images confirms the  above findings.  IMPRESSION: 1. No evidence of pulmonary embolism or other acute finding. 2. Right-sided pulmonary nodules are stable or smaller compared to 2015. 3. Calcified mediastinal nodes correlating with sarcoidosis history.   Electronically Signed By: Marnee Spring M.D. On: 06/15/2015 11:08          DG Chest 2 View (Final result) Result time: 06/15/15 09:11:52   Final result by Rad Results In Interface (06/15/15 09:11:52)   Narrative:   CLINICAL DATA: 49 year old female status post syncope while at work  EXAM: CHEST 2 VIEW  COMPARISON: Prior chest x-ray 04/27/2015  FINDINGS: The lungs are clear and negative for focal airspace consolidation, pulmonary edema or suspicious pulmonary nodule. No pleural effusion or pneumothorax. Cardiac and mediastinal contours are within normal limits. No acute fracture or lytic or blastic osseous lesions. The visualized upper abdominal bowel gas pattern is unremarkable.  IMPRESSION: Negative chest x-ray.        I personally reviewed the radiologic studies    ED Course:  Patient's stay here was uneventful. She had symptomatic improvement with anti-anxiety medications. Patient I felt had a gradual loss of consciousness and did not have cardiogenic syncope. Her D-dimer test was only slightly elevated and chest CT shows no evidence of pulmonary embolism or other intrathoracic pathology. We could stop the investigation at this point and the patient was discharged home. She's been advised to rest and drink plenty of fluids and return here if she has any chest pain or shortness of breath.   Assessment: Vasovagal syncope     Plan: Outpatient management Patient was advised to return immediately if condition worsens. Patient was advised to follow up with her primary care physician or other specialized physicians involved and in their current assessment.            Jennye Moccasin, MD 06/15/15 [phone]

## 2015-06-15 NOTE — ED Notes (Signed)
Pt at work felt dizzy, nauseated, woke up in ambulance. Unresponsive to sternal rub for ems. No witnessed seizure activity. No hx seizures. Pt sleepy but oriented now.

## 2015-06-16 NOTE — ED Notes (Signed)
Pt given 1mg  ativan on 10/17 at 0937; other 1mg  wasted on 10/18 at 0733. Unable to waste in pyxis. and pharmacy notified.

## 2015-06-16 NOTE — ED Notes (Signed)
Pt given 1mg  ativan; other 1mg  wasted today at this time.  Unable to waste in pyxis.

## 2016-08-28 ENCOUNTER — Emergency Department
Admission: EM | Admit: 2016-08-28 | Discharge: 2016-08-28 | Disposition: A | Discharge status: Home / Self Care | Payer: Managed Care, Other (non HMO) | Attending: Emergency Medicine | Admitting: Emergency Medicine

## 2016-08-28 ENCOUNTER — Emergency Department: Payer: Managed Care, Other (non HMO)

## 2016-08-28 DIAGNOSIS — M79604 Pain in right leg: Secondary | ICD-10-CM

## 2016-08-28 DIAGNOSIS — J45909 Unspecified asthma, uncomplicated: Secondary | ICD-10-CM | POA: Diagnosis not present

## 2016-08-28 DIAGNOSIS — Z7982 Long term (current) use of aspirin: Secondary | ICD-10-CM | POA: Insufficient documentation

## 2016-08-28 DIAGNOSIS — Z79899 Other long term (current) drug therapy: Secondary | ICD-10-CM | POA: Insufficient documentation

## 2016-08-28 LAB — CBC
HCT: 44.2 % (ref 35.0–47.0)
Hemoglobin: 15 g/dL (ref 12.0–16.0)
MCH: 28.5 pg (ref 26.0–34.0)
MCHC: 33.8 g/dL (ref 32.0–36.0)
MCV: 84.2 fL (ref 80.0–100.0)
Platelets: 180 10*3/uL (ref 150–440)
RBC: 5.25 MIL/uL — ABNORMAL HIGH (ref 3.80–5.20)
RDW: 14.4 % (ref 11.5–14.5)
WBC: 7.8 10*3/uL (ref 3.6–11.0)

## 2016-08-28 LAB — COMPREHENSIVE METABOLIC PANEL
ALT: 20 U/L (ref 14–54)
AST: 20 U/L (ref 15–41)
Albumin: 4.1 g/dL (ref 3.5–5.0)
Alkaline Phosphatase: 59 U/L (ref 38–126)
Anion gap: 6 (ref 5–15)
BUN: 17 mg/dL (ref 6–20)
CO2: 30 mmol/L (ref 22–32)
Calcium: 9.1 mg/dL (ref 8.9–10.3)
Chloride: 105 mmol/L (ref 101–111)
Creatinine, Ser: 1.33 mg/dL — ABNORMAL HIGH (ref 0.44–1.00)
GFR calc Af Amer: 53 mL/min — ABNORMAL LOW (ref >60)
GFR calc non Af Amer: 46 mL/min — ABNORMAL LOW (ref >60)
Glucose, Bld: 81 mg/dL (ref 65–99)
Potassium: 3.1 mmol/L — ABNORMAL LOW (ref 3.5–5.1)
Sodium: 141 mmol/L (ref 135–145)
Total Bilirubin: 0.5 mg/dL (ref 0.3–1.2)
Total Protein: 7.2 g/dL (ref 6.5–8.1)

## 2016-08-28 LAB — TROPONIN I: Troponin I: <0.03 ng/mL (ref <0.03)

## 2016-08-28 MED ORDER — CYCLOBENZAPRINE HCL 10 MG PO TABS: 10.0000 mg | ORAL_TABLET | Freq: Three times a day (TID) | ORAL | 0 refills | Status: DC | PRN

## 2016-08-28 NOTE — ED Notes (Signed)

## 2016-08-28 NOTE — ED Triage Notes (Signed)
Pt states that for the past week she has been having numbness and pain in her rt 5th toe radiating up her right leg, pt states that this am she had some numbness to both legs, pt states occasionally her lower back hurts like a pinched nerve, pt also states that she has sarcoid and states that she has been having increased diff breathing with chest tightness and having to sit up to sleep

## 2016-08-28 NOTE — ED Notes (Signed)
Helped pt to bathroom. 

## 2016-08-28 NOTE — Discharge Instructions (Signed)
You were evaluated for right leg pain, and although no certain cause was found, as we discussed I suspect musculoskeletal pain and muscle spasm in am recommending that he take over-the-counter ibuprofen 600 mg every 8 hours as needed for pain, and a muscle relaxer, and use massage and heat to help alleviate the discomfort.  As we discussed, I had recommended to do an ultrasound of the leg to rule out deep vein blood clot, and you chose to decline this tonight.  As we discussed, based on your symptoms of an episode of weakness in the right leg yesterday which is actually improved today, I had recommended CT scan of your head and you chose to decline this tonight as well.  Please see your primary doctor this week, call on Tuesday for an appointment next available for further evaluation.  Return to the emergency department immediately for any new or recurrent weakness, any new or recurrent numbness or tingling. Return for any swelling or redness to the skin or leg. Return for any pain of the calf or the thigh muscle itself. Return for any new or worsening trouble breathing, chest pain, or fever.

## 2016-08-28 NOTE — ED Notes (Signed)
EDP at bedside  

## 2016-08-28 NOTE — ED Notes (Signed)
Pt states that she has been waiting too long and wants to leave. Informed pt that EDP said she is next to be seen. Pt states she will wait at this time.

## 2016-08-28 NOTE — ED Provider Notes (Signed)
[facility] Emergency Department Provider Note ____________________________________________   I have reviewed the triage vital signs and the triage nursing note.  HISTORY  Chief Complaint Leg Pain   Historian Patient  HPI Brittany Huynh is a 50 y.o. female with a history of sarcoidosis and a history of intermittent numbness and tingling states that yesterday she had a more significant episode of numbness and tingling of both legs and points to her shins up to her knees. She also states that at times she has to lift herself with her arms to get up because her legs feel too weak to stand up and this is both sides and has happened what sounds like over a period of weeks to months. Yesterday she states that her right leg seemed weak like she had to drag it but that improved and essentially gone today. She came today for evaluation because since yesterday her right pinky toe and lateral foot has been painful and she feels pains shooting from that area up her foot into the lateral aspect of her right lower extremity. She does not report any new shoes, long car rides, or any traumatic injury or twist or sprain.  She does have a history of sarcoidosis and states that she might have a slight upper respiratory infection in terms of mild cough, but not really significant from baseline in terms of shortness of breath or chest discomfort.  She is quite exhausted from the long wait here in the emergency department and states that she is ready to go home.    Past Medical History:  Diagnosis Date  . Asthma   . Breast fibrocystic disorder 2013  . Hernia 2011   chest wall  . Lung mass 2011  . Sarcoidosis (HCC)   . Shortness of breath    2011    Patient Active Problem List   Diagnosis Date Noted  . Fibrocystic breast disease 04/05/2013  . Breast screening 04/05/2013  . Lump or mass in breast 04/05/2013    Past Surgical History:  Procedure Laterality Date  . ABDOMINAL  HYSTERECTOMY  2009  . chamberlain procedure   2011  . LUNG BIOPSY  2011  . RESECTION RIBS EXTRAPLEURAL  59563    Prior to Admission medications   Medication Sig Start Date End Date Taking? Authorizing Provider  albuterol (PROVENTIL HFA;VENTOLIN HFA) 108 (90 BASE) MCG/ACT inhaler Inhale 2 puffs into the lungs every 6 (six) hours as needed for wheezing.     Historical Provider, MD  aspirin-acetaminophen-caffeine (EXCEDRIN MIGRAINE) [id number] MG per tablet Take 1 tablet by mouth every 6 (six) hours as needed for headache.     Historical Provider, MD  budesonide-formoterol (SYMBICORT) 160-4.5 MCG/ACT inhaler Inhale 2 puffs into the lungs 2 (two) times daily.    Historical Provider, MD  cyclobenzaprine (FLEXERIL) 10 MG tablet Take 1 tablet (10 mg total) by mouth 3 (three) times daily as needed for muscle spasms. 08/28/16   Governor Rooks, MD  mirtazapine (REMERON) 30 MG tablet Take 30 mg by mouth at bedtime.    Historical Provider, MD  pramipexole (MIRAPEX) 1 MG tablet Take 1 mg by mouth at bedtime.    Historical Provider, MD  predniSONE (DELTASONE) 10 MG tablet Take 10 mg by mouth daily.    Historical Provider, MD    Allergies  Allergen Reactions  . Onion Anaphylaxis  . Adhesive [Tape] Rash    Family History  Problem Relation Age of Onset  . Liver cancer Mother   . Bone  cancer Maternal Aunt   . Breast cancer Paternal Uncle   . Breast cancer Maternal Aunt     Social History Social History  Substance Use Topics  . Smoking status: Never Smoker  . Smokeless tobacco: Never Used  . Alcohol use Yes     Comment: on occassion    Review of Systems  Constitutional: Negative for fever. Eyes: Negative for visual changes. ENT: Negative for sore throat. Cardiovascular: Negative for chest pain. Respiratory: Chronic but not acute worsening shortness of breath. Gastrointestinal: Negative for abdominal pain, vomiting and diarrhea. Genitourinary: Negative for dysuria. Musculoskeletal:  Negative for back pain.  Right lower extremity pain as per history of present illness. Skin: Negative for rash. Neurological: Negative for headache. 10 point Review of Systems otherwise negative ____________________________________________   PHYSICAL EXAM:  VITAL SIGNS: ED Triage Vitals  Enc Vitals Group     BP 08/28/16 1700 (!) 144/88     Pulse Rate 08/28/16 1700 95     Resp 08/28/16 1700 18     Temp 08/28/16 1700 97.8 F (36.6 C)     Temp Source 08/28/16 1700 Oral     SpO2 08/28/16 1700 99 %     Weight 08/28/16 1700 155 lb (70.3 kg)     Height 08/28/16 1700 5\' 7"  (1.702 m)     Head Circumference --      Peak Flow --      Pain Score 08/28/16 1701 8     Pain Loc --      Pain Edu? --      Excl. in GC? --      Constitutional: Alert and oriented. Well appearing and in no distress. HEENT   Head: Normocephalic and atraumatic.      Eyes: Conjunctivae are normal. PERRL. Normal extraocular movements.      Ears:         Nose: No congestion/rhinnorhea.   Mouth/Throat: Mucous membranes are moist.   Neck: No stridor. Cardiovascular/Chest: Normal rate, regular rhythm.  No murmurs, rubs, or gallops. Respiratory: Normal respiratory effort without tachypnea nor retractions. Breath sounds are clear and equal bilaterally. No wheezes/rales/rhonchi. Gastrointestinal: Soft. No distention, no guarding, no rebound. Nontender.    Genitourinary/rectal:Deferred Musculoskeletal: Nontender with normal range of motion in all extremities. No thigh, hamstring, or calf tenderness in the bilateral lower extremities. No lower extremity edema. She has tenderness to palpable muscle spasm in the lateral aspect of her right lower extremity down to her ankle and then also across the top and lateral aspect of her right foot down to her right pinky. Neurologic:  Normal speech and language.  No facial droop.  Reports normal sensation now. 5 out of 5 strength in 4 extremities.  Skin:  Skin is warm, dry and  intact. No rash noted. Psychiatric: Mood and affect are normal. Speech and behavior are normal. Patient exhibits appropriate insight and judgment.   ____________________________________________  LABS (pertinent positives/negatives)  Labs Reviewed  CBC - Abnormal; Notable for the following:       Result Value   RBC 5.25 (*)    All other components within normal limits  COMPREHENSIVE METABOLIC PANEL - Abnormal; Notable for the following:    Potassium 3.1 (*)    Creatinine, Ser 1.33 (*)    GFR calc non Af Amer 46 (*)    GFR calc Af Amer 53 (*)    All other components within normal limits  TROPONIN I    ____________________________________________    EKG I, Governor Rooks, MD,  the attending physician have personally viewed and interpreted all ECGs.  92 bpm. Normal sinus rhythm. Narrow QRS. Normal axis. Nonspecific ST and T-wave ____________________________________________  RADIOLOGY All Xrays were viewed by me. Imaging interpreted by Radiologist.  Chest x-ray two-view: No active pulmonary disease. __________________________________________  PROCEDURES  Procedure(s) performed: None  Critical Care performed: None  ____________________________________________   ED COURSE / ASSESSMENT AND PLAN  Pertinent labs & imaging results that were available during my care of the patient were reviewed by me and considered in my medical decision making (see chart for details).   Brittany Huynh came today for evaluation of right lateral lower extremity pain and on exam her leg does not look consistent with DVT, cellulitis, and actually is tender along the muscle or/muscle spasm. Of note however she does describe some neurologic symptoms including weakness and numbness tingling especially yesterday, which are essentially resolved now with no focal neurologic deficit on exam today.  In terms of completion sake I did discuss obtaining ultrasound of the right lower extremity to rule out DVT  although clinically it does not look like there is obvious signs of DVT. She does not want to wait any longer in the emergency department and understands her condition but does not want to stay in the emergency department for any further testing. I will treat her for musculoskeletal discomfort this point time.  Her discussion of shortness of breath sounds to be all chronic related and not acute respiratory distress cardiac issue.  In terms of her complaint of focal weakness of the right lower extremity yesterday, I did discuss with her that at worst this could be related to stroke-tia but with no focal neurologic deficit on exam things do look more reassuring today. I did recommend CT of the head based on the symptoms, but she also states that she is recently had an MRI and does not want to stay for any further head imaging. Again neurologic exam is intact to think it's okay choice for her.  She follows with Dr. Beverely Risen, for primary care doctor to make a phone call to the office on Tuesday, after New Year's Day tomorrow.  We discussed return precautions, and seems reasonable that she would come for any new or worsening condition.    CONSULTATIONS:   None   Patient / Family / Caregiver informed of clinical course, medical decision-making process, and agree with plan.   I discussed return precautions, follow-up instructions, and discharge instructions with patient and/or family.   ___________________________________________   FINAL CLINICAL IMPRESSION(S) / ED DIAGNOSES   Final diagnoses:  Right leg pain              Note: This dictation was prepared with Dragon dictation. Any transcriptional errors that result from this process are unintentional    Governor Rooks, MD 08/28/16 2206

## 2016-11-29 ENCOUNTER — Other Ambulatory Visit
Admission: RE | Admit: 2016-11-29 | Discharge: 2016-11-29 | Disposition: A | Discharge status: Home / Self Care | Payer: Managed Care, Other (non HMO) | Source: Ambulatory Visit | Attending: Nurse Practitioner | Admitting: Nurse Practitioner

## 2016-11-29 DIAGNOSIS — D509 Iron deficiency anemia, unspecified: Secondary | ICD-10-CM | POA: Diagnosis not present

## 2016-11-29 DIAGNOSIS — E876 Hypokalemia: Secondary | ICD-10-CM | POA: Insufficient documentation

## 2016-11-29 DIAGNOSIS — M064 Inflammatory polyarthropathy: Secondary | ICD-10-CM | POA: Insufficient documentation

## 2016-11-29 DIAGNOSIS — R5383 Other fatigue: Secondary | ICD-10-CM | POA: Diagnosis present

## 2016-11-29 LAB — COMPREHENSIVE METABOLIC PANEL
ALT: UNDETERMINED U/L (ref 14–54)
AST: UNDETERMINED U/L (ref 15–41)
Albumin: UNDETERMINED g/dL (ref 3.5–5.0)
Alkaline Phosphatase: 73 U/L (ref 38–126)
Anion gap: 8 (ref 5–15)
BUN: 16 mg/dL (ref 6–20)
CO2: 25 mmol/L (ref 22–32)
Calcium: 9 mg/dL (ref 8.9–10.3)
Chloride: 102 mmol/L (ref 101–111)
Creatinine, Ser: UNDETERMINED mg/dL (ref 0.44–1.00)
Glucose, Bld: 89 mg/dL (ref 65–99)
Potassium: 3.7 mmol/L (ref 3.5–5.1)
Sodium: 135 mmol/L (ref 135–145)
Total Bilirubin: UNDETERMINED mg/dL (ref 0.3–1.2)
Total Protein: UNDETERMINED g/dL (ref 6.5–8.1)

## 2016-11-29 LAB — CBC WITH DIFFERENTIAL/PLATELET
Basophils Absolute: 0 10*3/uL (ref 0–0.1)
Basophils Relative: 0 %
Eosinophils Absolute: 0 10*3/uL (ref 0–0.7)
Eosinophils Relative: 0 %
HCT: 40.2 % (ref 35.0–47.0)
Hemoglobin: 13.3 g/dL (ref 12.0–16.0)
Lymphocytes Relative: 18 %
Lymphs Abs: 1.5 10*3/uL (ref 1.0–3.6)
MCH: 28.3 pg (ref 26.0–34.0)
MCHC: 33.1 g/dL (ref 32.0–36.0)
MCV: 85.7 fL (ref 80.0–100.0)
Monocytes Absolute: 0.4 10*3/uL (ref 0.2–0.9)
Monocytes Relative: 5 %
Neutro Abs: 6.5 10*3/uL (ref 1.4–6.5)
Neutrophils Relative %: 77 %
Platelets: 206 10*3/uL (ref 150–440)
RBC: 4.69 MIL/uL (ref 3.80–5.20)
RDW: 14.6 % — ABNORMAL HIGH (ref 11.5–14.5)
WBC: 8.5 10*3/uL (ref 3.6–11.0)

## 2016-11-29 LAB — FOLATE: Folate: 15.3 ng/mL (ref >5.9)

## 2016-11-29 LAB — FERRITIN: Ferritin: 23 ng/mL (ref 11–307)

## 2016-11-29 LAB — SEDIMENTATION RATE: Sed Rate: 19 mm/hr (ref 0–30)

## 2016-11-29 LAB — TSH: TSH: 0.215 u[IU]/mL — ABNORMAL LOW (ref 0.350–4.500)

## 2016-11-29 LAB — T4, FREE: Free T4: 0.87 ng/dL (ref 0.61–1.12)

## 2016-11-29 LAB — VITAMIN B12: Vitamin B-12: 238 pg/mL (ref 180–914)

## 2016-11-30 LAB — ANA W/REFLEX: Anti Nuclear Antibody(ANA): NEGATIVE

## 2016-11-30 LAB — RHEUMATOID FACTOR: Rhuematoid fact SerPl-aCnc: <10 IU/mL (ref 0.0–13.9)

## 2016-12-05 ENCOUNTER — Other Ambulatory Visit: Payer: Self-pay | Admitting: Nurse Practitioner

## 2016-12-05 DIAGNOSIS — R0602 Shortness of breath: Secondary | ICD-10-CM

## 2016-12-09 ENCOUNTER — Ambulatory Visit: Payer: PRIVATE HEALTH INSURANCE

## 2016-12-09 ENCOUNTER — Ambulatory Visit
Admission: RE | Admit: 2016-12-09 | Discharge: 2016-12-09 | Disposition: A | Discharge status: Home / Self Care | Payer: Managed Care, Other (non HMO) | Source: Ambulatory Visit | Attending: Nurse Practitioner | Admitting: Nurse Practitioner

## 2016-12-09 DIAGNOSIS — R918 Other nonspecific abnormal finding of lung field: Secondary | ICD-10-CM | POA: Insufficient documentation

## 2016-12-09 DIAGNOSIS — R0602 Shortness of breath: Secondary | ICD-10-CM

## 2016-12-09 DIAGNOSIS — D860 Sarcoidosis of lung: Secondary | ICD-10-CM | POA: Insufficient documentation

## 2016-12-09 MED ORDER — IOPAMIDOL (ISOVUE-300) INJECTION 61%
75.0000 mL | Freq: Once | INTRAVENOUS | Status: AC | PRN
  Administered 2016-12-09: 75 mL via INTRAVENOUS

## 2017-09-07 ENCOUNTER — Ambulatory Visit: Payer: Self-pay | Admitting: Nurse Practitioner

## 2017-09-14 ENCOUNTER — Emergency Department: Payer: Managed Care, Other (non HMO)

## 2017-09-14 ENCOUNTER — Emergency Department
Admission: EM | Admit: 2017-09-14 | Discharge: 2017-09-14 | Disposition: A | Discharge status: Home / Self Care | Payer: Managed Care, Other (non HMO) | Attending: Emergency Medicine | Admitting: Emergency Medicine

## 2017-09-14 ENCOUNTER — Other Ambulatory Visit: Payer: Self-pay

## 2017-09-14 ENCOUNTER — Encounter: Payer: Self-pay | Admitting: Emergency Medicine

## 2017-09-14 DIAGNOSIS — S63601A Unspecified sprain of right thumb, initial encounter: Secondary | ICD-10-CM | POA: Insufficient documentation

## 2017-09-14 DIAGNOSIS — Y929 Unspecified place or not applicable: Secondary | ICD-10-CM | POA: Insufficient documentation

## 2017-09-14 DIAGNOSIS — W19XXXA Unspecified fall, initial encounter: Secondary | ICD-10-CM | POA: Insufficient documentation

## 2017-09-14 DIAGNOSIS — B9789 Other viral agents as the cause of diseases classified elsewhere: Secondary | ICD-10-CM | POA: Diagnosis not present

## 2017-09-14 DIAGNOSIS — Y999 Unspecified external cause status: Secondary | ICD-10-CM | POA: Diagnosis not present

## 2017-09-14 DIAGNOSIS — J069 Acute upper respiratory infection, unspecified: Secondary | ICD-10-CM | POA: Insufficient documentation

## 2017-09-14 DIAGNOSIS — Y939 Activity, unspecified: Secondary | ICD-10-CM | POA: Diagnosis not present

## 2017-09-14 DIAGNOSIS — Z79899 Other long term (current) drug therapy: Secondary | ICD-10-CM | POA: Diagnosis not present

## 2017-09-14 DIAGNOSIS — S60931A Unspecified superficial injury of right thumb, initial encounter: Secondary | ICD-10-CM | POA: Diagnosis present

## 2017-09-14 DIAGNOSIS — J45909 Unspecified asthma, uncomplicated: Secondary | ICD-10-CM | POA: Diagnosis not present

## 2017-09-14 MED ORDER — BENZONATATE 100 MG PO CAPS: 200.0000 mg | ORAL_CAPSULE | Freq: Three times a day (TID) | ORAL | 0 refills | Status: DC | PRN

## 2017-09-14 MED ORDER — TRAMADOL HCL 50 MG PO TABS: 50.0000 mg | ORAL_TABLET | Freq: Four times a day (QID) | ORAL | 0 refills | Status: DC | PRN

## 2017-09-14 MED ORDER — IBUPROFEN 600 MG PO TABS: 600.0000 mg | ORAL_TABLET | Freq: Three times a day (TID) | ORAL | 0 refills | Status: DC | PRN

## 2017-09-14 NOTE — ED Provider Notes (Addendum)
[facility] Emergency Department Provider Note   ____________________________________________   First MD Initiated Contact with Patient 09/14/17 1115     (approximate)  I have reviewed the triage vital signs and the nursing notes.   HISTORY  Chief Complaint Cough; Sore Throat; Headache; and Chills    HPI Brittany Huynh is a 52 y.o. female patient complain of right thumb pain secondary to a fall yesterday.  Patient also complained of chest wall pain secondary to productive cough.  Patient cough causes headache and she is also experiencing chills but denies fever.  Patient rates the pain as a 4/10.  Patient described the pain as "aching".  No palliative measured for complaint.  Past Medical History:  Diagnosis Date  . Asthma   . Breast fibrocystic disorder 2013  . Hernia 2011   chest wall  . Lung mass 2011  . Sarcoidosis   . Shortness of breath    2011    Patient Active Problem List   Diagnosis Date Noted  . Fibrocystic breast disease 04/05/2013  . Breast screening 04/05/2013  . Lump or mass in breast 04/05/2013    Past Surgical History:  Procedure Laterality Date  . ABDOMINAL HYSTERECTOMY  2009  . chamberlain procedure   2011  . LUNG BIOPSY  2011  . RESECTION RIBS EXTRAPLEURAL  67591    Prior to Admission medications   Medication Sig Start Date End Date Taking? Authorizing Provider  albuterol (PROVENTIL HFA;VENTOLIN HFA) 108 (90 BASE) MCG/ACT inhaler Inhale 2 puffs into the lungs every 6 (six) hours as needed for wheezing.     [provider]  aspirin-acetaminophen-caffeine (EXCEDRIN MIGRAINE) [phone] MG per tablet Take 1 tablet by mouth every 6 (six) hours as needed for headache.     [provider]  benzonatate (TESSALON PERLES) 100 MG capsule Take 2 capsules (200 mg total) by mouth 3 (three) times daily as needed. 09/14/17 09/14/18  Joni Reining, PA-C  budesonide-formoterol [facility]) 160-4.5 MCG/ACT inhaler  Inhale 2 puffs into the lungs 2 (two) times daily.    [provider]  cyclobenzaprine (FLEXERIL) 10 MG tablet Take 1 tablet (10 mg total) by mouth 3 (three) times daily as needed for muscle spasms. 08/28/16   Governor Rooks, MD  ibuprofen (ADVIL,MOTRIN) 600 MG tablet Take 1 tablet (600 mg total) by mouth every 8 (eight) hours as needed. 09/14/17   Joni Reining, PA-C  mirtazapine (REMERON) 30 MG tablet Take 30 mg by mouth at bedtime.    [provider]  pramipexole (MIRAPEX) 1 MG tablet Take 1 mg by mouth at bedtime.    [provider]  predniSONE (DELTASONE) 10 MG tablet Take 10 mg by mouth daily.    [provider]  traMADol (ULTRAM) 50 MG tablet Take 1 tablet (50 mg total) by mouth every 6 (six) hours as needed. 09/14/17 09/14/18  Joni Reining, PA-C    Allergies Onion and Adhesive [tape]  Family History  Problem Relation Age of Onset  . Liver cancer Mother   . Bone cancer Maternal Aunt   . Breast cancer Paternal Uncle   . Breast cancer Maternal Aunt     Social History Social History   Tobacco Use  . Smoking status: Never Smoker  . Smokeless tobacco: Never Used  Substance Use Topics  . Alcohol use: Yes    Comment: on occassion  . Drug use: No    Review of Systems Constitutional: No fever/chills Eyes: No visual changes. ENT: No  sore throat. Cardiovascular: Denies chest pain. Respiratory: Denies shortness of breath.  Productive cough Gastrointestinal: No abdominal pain.  No nausea, no vomiting.  No diarrhea.  No constipation. Genitourinary: Negative for dysuria. Musculoskeletal: Right thumb pain Skin: Negative for rash. Neurological: Negative for headaches, focal weakness or numbness.   ____________________________________________   PHYSICAL EXAM:  VITAL SIGNS: ED Triage Vitals  Enc Vitals Group     BP 09/14/17 1105 (!) 178/98     Pulse Rate 09/14/17 1105 (!) 109     Resp 09/14/17 1105 16     Temp 09/14/17 1105 97.7 F  (36.5 C)     Temp Source 09/14/17 1105 Oral     SpO2 09/14/17 1105 100 %     Weight 09/14/17 1105 162 lb (73.5 kg)     Height 09/14/17 1105 5\' 6"  (1.676 m)     Head Circumference --      Peak Flow --      Pain Score 09/14/17 1104 4     Pain Loc --      Pain Edu? --      Excl. in GC? --    Constitutional: Alert and oriented. Well appearing and in no acute distress. Mouth/Throat: Mucous membranes are moist.  Oropharynx non-erythematous. Hematological/Lymphatic/Immunilogical: No cervical lymphadenopathy. Cardiovascular: Normal rate, regular rhythm. Grossly normal heart sounds.  Good peripheral circulation.  Elevated blood pressure Respiratory: Normal respiratory effort.  No retractions. Lungs CTAB.  Musculoskeletal: No obvious deformity to the right thumb.  Patient has full neck range of motion.  Patient has moderate guarding palpation lateral aspect of the proximal phalanges of the first digit right hand. Neurologic:  Normal speech and language. No gross focal neurologic deficits are appreciated. No gait instability. Skin:  Skin is warm, dry and intact. No rash noted. Psychiatric: Mood and affect are normal. Speech and behavior are normal.  ____________________________________________   LABS (all labs ordered are listed, but only abnormal results are displayed)  Labs Reviewed - No data to display ____________________________________________  EKG   ____________________________________________  RADIOLOGY  Dg Chest 2 View  Result Date: 09/14/2017 CLINICAL DATA:  Cough for 2 days, history of sarcoidosis EXAM: CHEST  2 VIEW COMPARISON:  CT chest of 12/09/2016 and chest x-ray of 08/28/2016 FINDINGS: No active infiltrate or effusion is seen. A noncalcified nodule in the medial right upper lobe appears relatively stable when compared to the prior CT. No new pulmonary nodule is seen. Mediastinal and hilar contours are unremarkable. Mild apical pleural thickening is noted. The heart is  within normal limits in size. The no bony abnormality is seen. IMPRESSION: Stable chest x-ray. No change in medial right upper lobe nodule described previously. Electronically Signed   By: 08/30/2016 M.D.   On: 09/14/2017 11:59   Dg Finger Thumb Right  Result Date: 09/14/2017 CLINICAL DATA:  Recent fall with pain, limited range of motion EXAM: RIGHT THUMB 2+V COMPARISON:  None. FINDINGS: No acute fracture is seen. Joint spaces appear normal. Alignment is normal. IMPRESSION: Negative. Electronically Signed   By: 09/16/2017 M.D.   On: 09/14/2017 11:57    ____________________________________________   PROCEDURES  Procedure(s) performed: None  .Splint Application Date/Time: 09/14/2017 2:19 PM Performed by: 09/16/2017, PA-C Authorized by: Joni Reining, PA-C   Consent:    Consent obtained:  Verbal   Consent given by:  Patient   Risks discussed:  Numbness and swelling   Alternatives discussed:  No treatment Pre-procedure details:    Sensation:  Normal  Procedure details:    Laterality:  Right   Location:  Finger   Finger:  R thumb   Strapping: no     Splint type:  Finger   Supplies:  Aluminum splint Post-procedure details:    Pain:  Unchanged   Sensation:  Normal   Patient tolerance of procedure:  Tolerated well, no immediate complications    Critical Care performed: No  ____________________________________________   INITIAL IMPRESSION / ASSESSMENT AND PLAN / ED COURSE  As part of my medical decision making, I reviewed the following data within the electronic MEDICAL RECORD NUMBER    Right thumb pain secondary to sprain.  Cough secondary to viral respiratory infection.  Chest wall pain secondary to cough.  Discussed x-ray findings with patient.  Patient given discharge care instruction work note.  Patient advised to wear splint for 2-3 days as needed.  Take medication as directed.  Follow-up PCP if no improvement in 3-5 days.       ____________________________________________   FINAL CLINICAL IMPRESSION(S) / ED DIAGNOSES  Final diagnoses:  Sprain of right thumb, unspecified site of finger, initial encounter  Viral URI with cough     ED Discharge Orders        Ordered    benzonatate (TESSALON PERLES) 100 MG capsule  3 times daily PRN     09/14/17 1216    traMADol (ULTRAM) 50 MG tablet  Every 6 hours PRN     09/14/17 1216    ibuprofen (ADVIL,MOTRIN) 600 MG tablet  Every 8 hours PRN     09/14/17 1216       Note:  This document was prepared using Dragon voice recognition software and may include unintentional dictation errors.    Joni Reining, PA-C 09/14/17 1220    Emily Filbert, MD 09/14/17 1221    Joni Reining, PA-C 09/14/17 1420    Emily Filbert, MD 09/14/17 [phone]

## 2017-09-14 NOTE — ED Triage Notes (Signed)
Says she feels dizzy sometimes and she fell once injuring her right thumb.

## 2017-09-14 NOTE — ED Triage Notes (Signed)
Says for 2 days coughing a lot.  Yellow sputum.  Making her head hurt from coughing so much.  Also having chills.

## 2017-09-14 NOTE — Discharge Instructions (Signed)
Finger splint for 2-3 days as needed

## 2017-09-14 NOTE — ED Notes (Signed)
See triage note   Presents with subjective fever,chills,cough and body aches since Monday    States she worse yesterday   afebrile on arrival

## 2017-09-15 ENCOUNTER — Ambulatory Visit: Payer: Self-pay | Admitting: Internal Medicine

## 2017-09-15 DIAGNOSIS — M064 Inflammatory polyarthropathy: Secondary | ICD-10-CM | POA: Insufficient documentation

## 2017-09-15 DIAGNOSIS — D509 Iron deficiency anemia, unspecified: Secondary | ICD-10-CM | POA: Insufficient documentation

## 2017-09-15 DIAGNOSIS — G43909 Migraine, unspecified, not intractable, without status migrainosus: Secondary | ICD-10-CM | POA: Insufficient documentation

## 2017-09-15 DIAGNOSIS — R609 Edema, unspecified: Secondary | ICD-10-CM

## 2017-09-15 DIAGNOSIS — R0602 Shortness of breath: Secondary | ICD-10-CM | POA: Insufficient documentation

## 2017-09-15 DIAGNOSIS — G2581 Restless legs syndrome: Secondary | ICD-10-CM | POA: Insufficient documentation

## 2017-09-15 DIAGNOSIS — R000 Tachycardia, unspecified: Secondary | ICD-10-CM

## 2017-09-15 DIAGNOSIS — D860 Sarcoidosis of lung: Secondary | ICD-10-CM | POA: Insufficient documentation

## 2017-09-15 DIAGNOSIS — G729 Myopathy, unspecified: Secondary | ICD-10-CM | POA: Insufficient documentation

## 2017-09-15 DIAGNOSIS — F411 Generalized anxiety disorder: Secondary | ICD-10-CM | POA: Insufficient documentation

## 2017-09-15 DIAGNOSIS — Z7952 Long term (current) use of systemic steroids: Secondary | ICD-10-CM | POA: Insufficient documentation

## 2017-09-15 DIAGNOSIS — G4710 Hypersomnia, unspecified: Secondary | ICD-10-CM | POA: Insufficient documentation

## 2017-09-15 DIAGNOSIS — K219 Gastro-esophageal reflux disease without esophagitis: Secondary | ICD-10-CM | POA: Insufficient documentation

## 2017-09-15 DIAGNOSIS — M792 Neuralgia and neuritis, unspecified: Secondary | ICD-10-CM | POA: Insufficient documentation

## 2017-09-15 DIAGNOSIS — A049 Bacterial intestinal infection, unspecified: Secondary | ICD-10-CM | POA: Insufficient documentation

## 2017-09-15 DIAGNOSIS — F339 Major depressive disorder, recurrent, unspecified: Secondary | ICD-10-CM | POA: Insufficient documentation

## 2017-09-15 HISTORY — DX: Tachycardia, unspecified: R00.0

## 2017-09-15 HISTORY — DX: Edema, unspecified: R60.9

## 2017-09-15 HISTORY — DX: Iron deficiency anemia, unspecified: D50.9

## 2017-11-27 ENCOUNTER — Other Ambulatory Visit: Payer: Self-pay

## 2017-11-27 ENCOUNTER — Telehealth: Payer: Self-pay | Admitting: Nurse Practitioner

## 2017-11-27 DIAGNOSIS — B029 Zoster without complications: Secondary | ICD-10-CM | POA: Insufficient documentation

## 2017-11-27 DIAGNOSIS — Z8619 Personal history of other infectious and parasitic diseases: Secondary | ICD-10-CM | POA: Insufficient documentation

## 2017-11-27 MED ORDER — PREDNISONE 10 MG PO TABS: 10.0000 mg | ORAL_TABLET | Freq: Every day | ORAL | 1 refills | Status: DC

## 2017-11-27 MED ORDER — PREDNISONE 1 MG PO TABS: ORAL_TABLET | ORAL | 1 refills | Status: DC

## 2017-11-27 MED ORDER — MIRTAZAPINE 30 MG PO TABS: 30.0000 mg | ORAL_TABLET | Freq: Every day | ORAL | 1 refills | Status: DC

## 2017-11-28 ENCOUNTER — Other Ambulatory Visit: Payer: Self-pay | Admitting: Nurse Practitioner

## 2017-11-28 DIAGNOSIS — F339 Major depressive disorder, recurrent, unspecified: Secondary | ICD-10-CM

## 2017-11-28 DIAGNOSIS — D860 Sarcoidosis of lung: Secondary | ICD-10-CM

## 2017-11-28 MED ORDER — PREDNISONE 10 MG PO TABS: 10.0000 mg | ORAL_TABLET | Freq: Every day | ORAL | 2 refills | Status: DC

## 2017-11-28 MED ORDER — MIRTAZAPINE 30 MG PO TABS: 30.0000 mg | ORAL_TABLET | Freq: Every day | ORAL | 2 refills | Status: DC

## 2017-11-28 NOTE — Telephone Encounter (Signed)
Renewed mirtazapine 30mg every evening and prednisone 10mg tablets daily. Both sent to [facility] [address].  

## 2017-11-28 NOTE — Progress Notes (Signed)
Renewed mirtazapine 30mg  every evening and prednisone 10mg  tablets daily. Both sent to [facility] .

## 2017-12-01 NOTE — Telephone Encounter (Signed)
Patient was advised that heather renewed her rx for mirtazapine 30mg  and prednisone 10mg ; sent to her pharmacy [facility] [address]

## 2018-01-02 ENCOUNTER — Ambulatory Visit: Payer: Managed Care, Other (non HMO) | Admitting: Nurse Practitioner

## 2018-01-02 ENCOUNTER — Encounter: Payer: Self-pay | Admitting: Nurse Practitioner

## 2018-01-02 VITALS — BP 146/96 | HR 97 | Resp 16 | Ht 66.5 in | Wt 164.0 lb

## 2018-01-02 DIAGNOSIS — D509 Iron deficiency anemia, unspecified: Secondary | ICD-10-CM

## 2018-01-02 DIAGNOSIS — E559 Vitamin D deficiency, unspecified: Secondary | ICD-10-CM

## 2018-01-02 DIAGNOSIS — I10 Essential (primary) hypertension: Secondary | ICD-10-CM

## 2018-01-02 DIAGNOSIS — N959 Unspecified menopausal and perimenopausal disorder: Secondary | ICD-10-CM

## 2018-01-02 DIAGNOSIS — F339 Major depressive disorder, recurrent, unspecified: Secondary | ICD-10-CM | POA: Diagnosis not present

## 2018-01-02 DIAGNOSIS — D860 Sarcoidosis of lung: Secondary | ICD-10-CM | POA: Diagnosis not present

## 2018-01-02 DIAGNOSIS — R300 Dysuria: Secondary | ICD-10-CM

## 2018-01-02 DIAGNOSIS — Z1231 Encounter for screening mammogram for malignant neoplasm of breast: Secondary | ICD-10-CM

## 2018-01-02 DIAGNOSIS — M792 Neuralgia and neuritis, unspecified: Secondary | ICD-10-CM | POA: Diagnosis not present

## 2018-01-02 DIAGNOSIS — R0789 Other chest pain: Secondary | ICD-10-CM | POA: Diagnosis not present

## 2018-01-02 DIAGNOSIS — Z0001 Encounter for general adult medical examination with abnormal findings: Secondary | ICD-10-CM

## 2018-01-02 DIAGNOSIS — Z1239 Encounter for other screening for malignant neoplasm of breast: Secondary | ICD-10-CM

## 2018-01-02 DIAGNOSIS — Z01411 Encounter for gynecological examination (general) (routine) with abnormal findings: Secondary | ICD-10-CM

## 2018-01-02 DIAGNOSIS — R5383 Other fatigue: Secondary | ICD-10-CM

## 2018-01-02 DIAGNOSIS — Z124 Encounter for screening for malignant neoplasm of cervix: Secondary | ICD-10-CM

## 2018-01-02 DIAGNOSIS — M064 Inflammatory polyarthropathy: Secondary | ICD-10-CM | POA: Diagnosis not present

## 2018-01-02 DIAGNOSIS — R0602 Shortness of breath: Secondary | ICD-10-CM

## 2018-01-02 DIAGNOSIS — G2581 Restless legs syndrome: Secondary | ICD-10-CM

## 2018-01-02 MED ORDER — MIRTAZAPINE 30 MG PO TABS: 30.0000 mg | ORAL_TABLET | Freq: Every day | ORAL | 3 refills | Status: DC

## 2018-01-02 MED ORDER — PREDNISONE 10 MG PO TABS: 10.0000 mg | ORAL_TABLET | Freq: Every day | ORAL | 2 refills | Status: DC

## 2018-01-02 MED ORDER — PRAMIPEXOLE DIHYDROCHLORIDE 1 MG PO TABS: 1.0000 mg | ORAL_TABLET | Freq: Every day | ORAL | 3 refills | Status: DC

## 2018-01-02 MED ORDER — PREDNISONE 1 MG PO TABS: ORAL_TABLET | ORAL | 1 refills | Status: DC

## 2018-01-02 NOTE — Progress Notes (Signed)
[facility] [place], [place] [address]  Internal MEDICINE  Office Visit Note  Patient Name: Brittany Huynh  [id number]  [id number]  Date of Service: 01/14/2018   Pt is here for routine health maintenance examination  Chief Complaint  Patient presents with  . Annual Exam  . Abnormal Pap Smear  . Shortness of Breath    history of sarcoid     Shortness of Breath  This is a recurrent problem. The current episode started more than 1 year ago. The problem occurs constantly. The problem has been gradually worsening. Associated symptoms include chest pain, headaches, leg swelling, orthopnea, swollen glands and wheezing. Pertinent negatives include no abdominal pain, neck pain, rash, rhinorrhea, sore throat or vomiting. The symptoms are aggravated by any activity, pollens, weather changes, URIs, fumes, exercise and emotional upset. The patient has no known risk factors for DVT/PE. She has tried beta agonist inhalers, body position changes, oral steroids, rest and steroid inhalers for the symptoms. The treatment provided mild relief. Her past medical history is significant for asthma. (Sarcoid.)     Current Medication: Outpatient Encounter Medications as of 01/02/2018  Medication Sig Note  . albuterol (PROVENTIL HFA;VENTOLIN HFA) 108 (90 BASE) MCG/ACT inhaler Inhale 2 puffs into the lungs every 6 (six) hours as needed for wheezing.    Marland Kitchen aspirin-acetaminophen-caffeine (EXCEDRIN MIGRAINE) 250-250-65 MG per tablet Take 1 tablet by mouth every 6 (six) hours as needed for headache.    . budesonide-formoterol (SYMBICORT) 160-4.5 MCG/ACT inhaler Inhale 2 puffs into the lungs 2 (two) times daily.   Marland Kitchen ibuprofen (ADVIL,MOTRIN) 600 MG tablet Take 1 tablet (600 mg total) by mouth every 8 (eight) hours as needed.   . mirtazapine (REMERON) 30 MG tablet Take 1 tablet (30 mg total) by mouth at bedtime.   . predniSONE (DELTASONE) 1 MG tablet Take 2  Tablets daily   . predniSONE  (DELTASONE) 10 MG tablet Take 1 tablet (10 mg total) by mouth daily.   . [DISCONTINUED] mirtazapine (REMERON) 30 MG tablet Take 1 tablet (30 mg total) by mouth at bedtime.   . [DISCONTINUED] predniSONE (DELTASONE) 1 MG tablet Take 2  Tablets daily 01/02/2018: patient on 55m prednisone  . [DISCONTINUED] predniSONE (DELTASONE) 10 MG tablet Take 1 tablet (10 mg total) by mouth daily. 01/02/2018: patient taking total of 152mdaily.  . benzonatate (TESSALON PERLES) 100 MG capsule Take 2 capsules (200 mg total) by mouth 3 (three) times daily as needed. (Patient not taking: Reported on 01/02/2018)   . cyclobenzaprine (FLEXERIL) 10 MG tablet Take 1 tablet (10 mg total) by mouth 3 (three) times daily as needed for muscle spasms. (Patient not taking: Reported on 01/02/2018)   . pramipexole (MIRAPEX) 1 MG tablet Take 1 tablet (1 mg total) by mouth at bedtime.   . traMADol (ULTRAM) 50 MG tablet Take 1 tablet (50 mg total) by mouth every 6 (six) hours as needed. (Patient not taking: Reported on 01/02/2018)   . [DISCONTINUED] pramipexole (MIRAPEX) 1 MG tablet Take 1 mg by mouth at bedtime.    No facility-administered encounter medications on file as of 01/02/2018.     Surgical History: Past Surgical History:  Procedure Laterality Date  . ABDOMINAL HYSTERECTOMY  2009  . chamberlain procedure   2011  . LUNG BIOPSY  2011  . RESECTION RIBS EXTRAPLEURAL  [id number]  Medical History: Past Medical History:  Diagnosis Date  . Asthma   . Breast fibrocystic disorder 2013  . Hernia 2011  chest wall  . Lung mass 2011  . Sarcoidosis   . Shortness of breath    2011    Family History: Family History  Problem Relation Age of Onset  . Liver cancer Mother   . Bone cancer Maternal Aunt   . Breast cancer Paternal Uncle   . Breast cancer Maternal Aunt       Review of Systems  Constitutional: Positive for activity change and fatigue. Negative for chills and unexpected weight change.  HENT: Negative for congestion,  postnasal drip, rhinorrhea, sneezing and sore throat.   Eyes: Negative.  Negative for redness.  Respiratory: Positive for chest tightness, shortness of breath and wheezing. Negative for cough.   Cardiovascular: Positive for chest pain, orthopnea and leg swelling. Negative for palpitations.  Gastrointestinal: Negative for abdominal pain, constipation, diarrhea, nausea and vomiting.  Endocrine: Positive for cold intolerance and polydipsia. Negative for heat intolerance, polyphagia and polyuria.  Genitourinary: Negative for difficulty urinating, dyspareunia, dysuria, flank pain and frequency.  Musculoskeletal: Positive for arthralgias and myalgias. Negative for back pain, joint swelling and neck pain.  Skin: Negative for rash.  Allergic/Immunologic: Positive for environmental allergies.  Neurological: Positive for dizziness, weakness and headaches. Negative for tremors and numbness.  Hematological: Negative for adenopathy. Does not bruise/bleed easily.  Psychiatric/Behavioral: Positive for dysphoric mood and sleep disturbance. Negative for behavioral problems (Depression) and suicidal ideas. The patient is nervous/anxious.    Today's Vitals   01/02/18 1533  BP: (!) 146/96  Pulse: 97  Resp: 16  SpO2: 98%  Weight: 164 lb (74.4 kg)  Height: 5' 6.5" (1.689 m)    Physical Exam  Constitutional: She is oriented to person, place, and time. She appears well-developed and well-nourished. No distress.  HENT:  Head: Normocephalic and atraumatic.  Mouth/Throat: Oropharynx is clear and moist. No oropharyngeal exudate.  Eyes: Pupils are equal, round, and reactive to light. EOM are normal.  Neck: Normal range of motion. Neck supple. No JVD present. No tracheal deviation present. No thyromegaly present.  Cardiovascular: Normal rate, regular rhythm, normal heart sounds and intact distal pulses. Exam reveals no gallop and no friction rub.  No murmur heard. Pulmonary/Chest: Effort normal. No respiratory  distress. She has decreased breath sounds. She has no wheezes. She has no rales. She exhibits no tenderness.  Abdominal: Soft. Bowel sounds are normal. There is no tenderness.  Genitourinary: Vagina normal and uterus normal.  Genitourinary Comments: There is no tenderness, masses, or organomegaly present during bimanual exam. No unusual discharge noted.   Musculoskeletal: Normal range of motion.  Generalized joint pain with point tenderness present. No joint deformities or abnormalities noted at this time.   Lymphadenopathy:    She has no cervical adenopathy.  Neurological: She is alert and oriented to person, place, and time. She displays normal reflexes. No cranial nerve deficit. Coordination normal.  Skin: Skin is warm and dry. Capillary refill takes 2 to 3 seconds. She is not diaphoretic.  Psychiatric: Her speech is normal and behavior is normal. Judgment and thought content normal. Cognition and memory are normal. She exhibits a depressed mood.  Nursing note and vitals reviewed.    LABS: Recent Results (from the past 2160 hour(s))  Urinalysis, Routine w reflex microscopic     Status: None   Collection Time: 01/02/18  5:03 PM  Result Value Ref Range   Specific Gravity, UA 1.015 1.005 - 1.030   pH, UA 6.5 5.0 - 7.5   Color, UA Yellow Yellow   Appearance Ur Clear Clear  Leukocytes, UA Negative Negative   Protein, UA Negative Negative/Trace   Glucose, UA Negative Negative   Ketones, UA Negative Negative   RBC, UA Negative Negative   Bilirubin, UA Negative Negative   Urobilinogen, Ur 0.2 0.2 - 1.0 mg/dL   Nitrite, UA Negative Negative   Microscopic Examination Comment     Comment: Microscopic not indicated and not performed.  Pap IG and HPV (high risk) DNA detection     Status: Abnormal   Collection Time: 01/02/18  5:03 PM  Result Value Ref Range   DIAGNOSIS: Comment     Comment: NEGATIVE FOR INTRAEPITHELIAL LESION OR MALIGNANCY.   Specimen adequacy: Comment     Comment:  Satisfactory for evaluation. No endocervical component is identified.   Clinician Provided ICD10 Comment     Comment: Z12.4   Performed by: Comment     Comment: Ailene Ards, Cytotechnologist (ASCP)   PAP Smear Comment .    Note: Comment     Comment: The Pap smear is a screening test designed to aid in the detection of premalignant and malignant conditions of the uterine cervix.  It is not a diagnostic procedure and should not be used as the sole means of detecting cervical cancer.  Both false-positive and false-negative reports do occur.    Test Methodology Comment     Comment: This liquid based ThinPrep(R) pap test was screened with the use of an image guided system.    HPV, high-risk Positive (A) Negative    Comment: This high-risk HPV test detects thirteen high-risk types (16/18/31/33/35/39/45/51/52/56/58/59/68) without differentiation.   FSH/LH     Status: None   Collection Time: 01/05/18  3:24 PM  Result Value Ref Range   LH 48.1 mIU/mL    Comment: (NOTE)                    Adult Female:                      Follicular phase      2.4 -  12.6                      Ovulation phase      14.0 -  95.6                      Luteal phase          1.0 -  11.4                      Postmenopausal        7.7 -  58.5    FSH 60.4 mIU/mL    Comment: (NOTE)                    Adult Female:                      Follicular phase      3.5 -  12.5                      Ovulation phase       4.7 -  21.5                      Luteal phase          1.7 -   7.7  Postmenopausal       25.8 - 134.8 Performed At: [facility] [place], [place] [id number] Rush Farmer MD [id number] Performed at [facility], [address] [place], [place] [address]   Estradiol     Status: None   Collection Time: 01/05/18  3:24 PM  Result Value Ref Range   Estradiol <5.0 pg/mL    Comment: (NOTE)                    Adult Female:                       Follicular phase   96.2 -   166.0                      Ovulation phase    85.8 -   498.0                      Luteal phase       43.8 -   211.0                      Postmenopausal     <6.0 -    54.7                    Pregnancy                      1st trimester     215.0 - >4300.0                    Girls (1-10 years)    6.0 -    27.0 Roche ECLIA methodology Performed At: [facility] [place], [place] [id number] Rush Farmer MD [id number] Performed at [facility], [address] [place], [place] [address]   Uric acid     Status: None   Collection Time: 01/05/18  3:24 PM  Result Value Ref Range   Uric Acid, Serum 4.9 2.3 - 6.6 mg/dL    Comment: Performed at [facility], [address] [place], [place] [address]  Sedimentation rate     Status: None   Collection Time: 01/05/18  3:24 PM  Result Value Ref Range   Sed Rate 29 0 - 30 mm/hr    Comment: Performed at [facility], [address] [place], [place] [address]  Rheumatoid factor     Status: None   Collection Time: 01/05/18  3:24 PM  Result Value Ref Range   Rhuematoid fact SerPl-aCnc 11.2 0.0 - 13.9 IU/mL    Comment: (NOTE) Performed At: [facility] [place], [place] [id number] Rush Farmer MD [id number] Performed at [facility], [address] [place], [place] [address]   ANA w/Reflex if Positive     Status: None   Collection Time: 01/05/18  3:24 PM  Result Value Ref Range   Anit Nuclear Antibody(ANA) Negative Negative    Comment: (NOTE) Performed At: [facility] [place], [place] [id number] Rush Farmer MD [id number] Performed at [facility], [address] [place], [place] [address]   Folate     Status: None   Collection Time: 01/05/18  3:24 PM  Result Value Ref Range   Folate 6.0 >5.9 ng/mL    Comment: Performed at [facility], [address],  [place], [place] [address]  Vitamin  B12     Status: Abnormal   Collection Time: 01/05/18  3:24 PM  Result Value Ref Range   Vitamin B-12 119 (L) 180 - 914 pg/mL    Comment: (NOTE) This assay is not validated for testing neonatal or myeloproliferative syndrome specimens for Vitamin B12 levels. Performed at [facility], [place] [address], [place], [place] [address]   Iron and TIBC     Status: None   Collection Time: 01/05/18  3:24 PM  Result Value Ref Range   Iron 43 28 - 170 ug/dL   TIBC 372 250 - 450 ug/dL   Saturation Ratios 12 10.4 - 31.8 %   UIBC 329 ug/dL    Comment: Performed at [facility], [address] [place], [place] [address]  Ferritin     Status: None   Collection Time: 01/05/18  3:24 PM  Result Value Ref Range   Ferritin 13 11 - 307 ng/mL    Comment: Performed at [facility], [address] [place], [place] [address]  Calcitriol (1,25 di-OH Vit D)     Status: None   Collection Time: 01/05/18  3:24 PM  Result Value Ref Range   Vit D, 1,25-Dihydroxy 77.2 19.9 - 79.3 pg/mL    Comment: (NOTE) Performed At: [facility] [place], [place] [id number] Rush Farmer MD [id number] Performed at [facility], [address] [place], [place] [address]   T4, free     Status: Abnormal   Collection Time: 01/05/18  3:24 PM  Result Value Ref Range   Free T4 0.66 (L) 0.82 - 1.77 ng/dL    Comment: (NOTE) Biotin ingestion may interfere with free T4 tests. If the results are inconsistent with the TSH level, previous test results, or the clinical presentation, then consider biotin interference. If needed, order repeat testing after stopping biotin. Performed at [facility], [address] [place], [place] [address]   TSH     Status: None   Collection Time: 01/05/18  3:24 PM  Result Value Ref Range   TSH 0.510 0.350 - 4.500 uIU/mL    Comment: Performed by a 3rd Generation assay with a functional  sensitivity of <=0.01 uIU/mL. Performed at [facility], [address] [place], [place] [address]   Lipid panel     Status: Abnormal   Collection Time: 01/05/18  3:24 PM  Result Value Ref Range   Cholesterol 282 (H) 0 - 200 mg/dL   Triglycerides 256 (H) <150 mg/dL   HDL 76 >40 mg/dL   Total CHOL/HDL Ratio 3.7 RATIO   VLDL 51 (H) 0 - 40 mg/dL   LDL Cholesterol 155 (H) 0 - 99 mg/dL    Comment:        Total Cholesterol/HDL:CHD Risk Coronary Heart Disease Risk Table                     Men   Women  1/2 Average Risk   3.4   3.3  Average Risk       5.0   4.4  2 X Average Risk   9.6   7.1  3 X Average Risk  23.4   11.0        Use the calculated Patient Ratio above and the CHD Risk Table to determine the patient's CHD Risk.        ATP III CLASSIFICATION (LDL):  <100     mg/dL   Optimal  100-129  mg/dL  Near or Above                    Optimal  130-159  mg/dL   Borderline  160-189  mg/dL   High  >190     mg/dL   Very High Performed at [facility], [address] [place], [place] [address]   Comprehensive metabolic panel     Status: None   Collection Time: 01/05/18  3:24 PM  Result Value Ref Range   Sodium 135 135 - 145 mmol/L   Potassium 3.6 3.5 - 5.1 mmol/L   Chloride 102 101 - 111 mmol/L   CO2 28 22 - 32 mmol/L   Glucose, Bld 87 65 - 99 mg/dL   BUN 16 6 - 20 mg/dL   Creatinine, Ser 0.77 0.44 - 1.00 mg/dL   Calcium 8.9 8.9 - 10.3 mg/dL   Total Protein 7.9 6.5 - 8.1 g/dL   Albumin 4.0 3.5 - 5.0 g/dL   AST 20 15 - 41 U/L   ALT 18 14 - 54 U/L   Alkaline Phosphatase 99 38 - 126 U/L   Total Bilirubin 0.3 0.3 - 1.2 mg/dL   GFR calc non Af Amer >60 >60 mL/min   GFR calc Af Amer >60 >60 mL/min    Comment: (NOTE) The eGFR has been calculated using the CKD EPI equation. This calculation has not been validated in all clinical situations. eGFR's persistently <60 mL/min signify possible Chronic Kidney Disease.    Anion gap 5 5 - 15    Comment: Performed  at [facility], [address] [place], [place] [address]  CBC with Differential/Platelet     Status: Abnormal   Collection Time: 01/05/18  3:24 PM  Result Value Ref Range   WBC 9.4 3.6 - 11.0 K/uL   RBC 4.96 3.80 - 5.20 MIL/uL   Hemoglobin 13.7 12.0 - 16.0 g/dL   HCT 41.9 35.0 - 47.0 %   MCV 84.5 80.0 - 100.0 fL   MCH 27.6 26.0 - 34.0 pg   MCHC 32.7 32.0 - 36.0 g/dL   RDW 15.0 (H) 11.5 - 14.5 %   Platelets 244 150 - 440 K/uL   Neutrophils Relative % 80 %   Neutro Abs 7.5 (H) 1.4 - 6.5 K/uL   Lymphocytes Relative 14 %   Lymphs Abs 1.3 1.0 - 3.6 K/uL   Monocytes Relative 6 %   Monocytes Absolute 0.6 0.2 - 0.9 K/uL   Eosinophils Relative 0 %   Eosinophils Absolute 0.0 0 - 0.7 K/uL   Basophils Relative 0 %   Basophils Absolute 0.0 0 - 0.1 K/uL    Comment: Performed at [facility], [address] [place],  [address]   Assessment/Plan: 1. Encounter for general adult medical examination with abnormal findings Annual health maintenance exam with pap smear today.   2. Essential hypertension Elevated bp today. Routine, fasting labs ordered. Monitor closely.  - Lipid panel - TSH  3. Shortness of breath History of sarcoid. Will get echo and CT chest for further evaluation.  - ECHOCARDIOGRAM COMPLETE; Future - CT Chest W Contrast; Future  4. 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 Refer to Dr. Devona Konig for management.  - predniSONE (DELTASONE) 1 MG tablet; Take 2  Tablets daily  Dispense: 180 tablet; Refill: 1 - predniSONE (DELTASONE) 10 MG tablet; Take 1 tablet (10 mg total) by mouth  daily.  Dispense: 30 tablet; Refill: 2  6. Fatigue, unspecified type Routine labs ordered. Check full anemia panel.  - CBC with Differential/Platelet - TSH - T4, free - Iron, TIBC and Ferritin Panel - Vitamin B12 - Folate  7. Inflammatory polyarthritis (HCC) Check connective  tissue panel - CBC with Differential/Platelet - Comprehensive metabolic panel - TSH - Rheumatoid factor - Sedimentation rate - Uric acid  8. Polyneuropathic pain Check connective tissue panel and refer to neurology for further evaluation and treatment.  - Comprehensive metabolic panel - ANA w/Reflex if Positive - Sedimentation rate - Uric acid - Ambulatory referral to Neurology  10. Iron deficiency anemia, unspecified iron deficiency anemia type Check anemia panel.  - T4, free - Iron, TIBC and Ferritin Panel - Vitamin B12 - Folate  11. Unspecified menopausal and perimenopausal disorder Check reproductive hormones.  - Estradiol - FSH/LH  12. Episode of recurrent major depressive disorder, unspecified depression episode severity (HCC) - mirtazapine (REMERON) 30 MG tablet; Take 1 tablet (30 mg total) by mouth at bedtime.  Dispense: 30 tablet; Refill: 3  13. Restless leg syndrome - pramipexole (MIRAPEX) 1 MG tablet; Take 1 tablet (1 mg total) by mouth at bedtime.  Dispense: 30 tablet; Refill: 3  14. Vitamin D deficiency - Vitamin D 1,25 dihydroxy  15. Routine cervical smear - Pap IG and HPV (high risk) DNA detection  16. Screening for breast cancer - MM 3D SCREEN BREAST BILATERAL; Future  17. Dysuria - Urinalysis, Routine w reflex microscopic   General Counseling: Hue verbalizes understanding of the findings of todays visit and agrees with plan of treatment. I have discussed any further diagnostic evaluation that may be needed or ordered today. We also reviewed her medications today. she has been encouraged to call the office with any questions or concerns that should arise related to todays visit.  This patient was seen by Leretha Pol, FNP- C in Collaboration with Dr Lavera Guise as a part of collaborative care agreement   Orders Placed This Encounter  Procedures  . CT Chest W Contrast  . MM 3D SCREEN BREAST BILATERAL  . Urinalysis, Routine w reflex  microscopic  . CBC with Differential/Platelet  . Comprehensive metabolic panel  . Lipid panel  . TSH  . T4, free  . Vitamin D 1,25 dihydroxy  . Iron, TIBC and Ferritin Panel  . Vitamin B12  . Folate  . ANA w/Reflex if Positive  . Rheumatoid factor  . Sedimentation rate  . Uric acid  . Estradiol  . FSH/LH  . Ambulatory referral to Neurology  . ECHOCARDIOGRAM COMPLETE    Meds ordered this encounter  Medications  . predniSONE (DELTASONE) 1 MG tablet    Sig: Take 2  Tablets daily    Dispense:  180 tablet    Refill:  1    Order Specific Question:   Supervising Provider    Answer:   Lavera Guise [[id number]]  . predniSONE (DELTASONE) 10 MG tablet    Sig: Take 1 tablet (10 mg total) by mouth daily.    Dispense:  30 tablet    Refill:  2    Order Specific Question:   Supervising Provider    Answer:   Lavera Guise [[id number]]  . pramipexole (MIRAPEX) 1 MG tablet    Sig: Take 1 tablet (1 mg total) by mouth at bedtime.    Dispense:  30 tablet    Refill:  3    Order Specific Question:   Supervising Provider  Answer:   Lavera Guise [place]  . mirtazapine (REMERON) 30 MG tablet    Sig: Take 1 tablet (30 mg total) by mouth at bedtime.    Dispense:  30 tablet    Refill:  3    Order Specific Question:   Supervising Provider    Answer:   Lavera Guise [[id number]]    Time spent: [place], MD  Internal Medicine

## 2018-01-03 LAB — URINALYSIS, ROUTINE W REFLEX MICROSCOPIC
Bilirubin, UA: NEGATIVE
Glucose, UA: NEGATIVE
Ketones, UA: NEGATIVE
Leukocytes, UA: NEGATIVE
Nitrite, UA: NEGATIVE
Protein, UA: NEGATIVE
RBC, UA: NEGATIVE
Specific Gravity, UA: 1.015 (ref 1.005–1.030)
Urobilinogen, Ur: 0.2 mg/dL (ref 0.2–1.0)
pH, UA: 6.5 (ref 5.0–7.5)

## 2018-01-04 LAB — PAP IG AND HPV HIGH-RISK
HPV, high-risk: POSITIVE — AB
PAP Smear Comment: 0

## 2018-01-05 ENCOUNTER — Other Ambulatory Visit
Admission: RE | Admit: 2018-01-05 | Discharge: 2018-01-05 | Disposition: A | Discharge status: Home / Self Care | Payer: Managed Care, Other (non HMO) | Source: Ambulatory Visit | Attending: Nurse Practitioner | Admitting: Nurse Practitioner

## 2018-01-05 DIAGNOSIS — M792 Neuralgia and neuritis, unspecified: Secondary | ICD-10-CM | POA: Diagnosis present

## 2018-01-05 DIAGNOSIS — E559 Vitamin D deficiency, unspecified: Secondary | ICD-10-CM | POA: Diagnosis not present

## 2018-01-05 DIAGNOSIS — D509 Iron deficiency anemia, unspecified: Secondary | ICD-10-CM | POA: Diagnosis not present

## 2018-01-05 DIAGNOSIS — I10 Essential (primary) hypertension: Secondary | ICD-10-CM | POA: Diagnosis not present

## 2018-01-05 DIAGNOSIS — M064 Inflammatory polyarthropathy: Secondary | ICD-10-CM | POA: Insufficient documentation

## 2018-01-05 DIAGNOSIS — N959 Unspecified menopausal and perimenopausal disorder: Secondary | ICD-10-CM | POA: Insufficient documentation

## 2018-01-05 DIAGNOSIS — R5383 Other fatigue: Secondary | ICD-10-CM | POA: Insufficient documentation

## 2018-01-05 LAB — IRON AND TIBC
Iron: 43 ug/dL (ref 28–170)
Saturation Ratios: 12 % (ref 10.4–31.8)
TIBC: 372 ug/dL (ref 250–450)
UIBC: 329 ug/dL

## 2018-01-05 LAB — COMPREHENSIVE METABOLIC PANEL
ALT: 18 U/L (ref 14–54)
AST: 20 U/L (ref 15–41)
Albumin: 4 g/dL (ref 3.5–5.0)
Alkaline Phosphatase: 99 U/L (ref 38–126)
Anion gap: 5 (ref 5–15)
BUN: 16 mg/dL (ref 6–20)
CO2: 28 mmol/L (ref 22–32)
Calcium: 8.9 mg/dL (ref 8.9–10.3)
Chloride: 102 mmol/L (ref 101–111)
Creatinine, Ser: 0.77 mg/dL (ref 0.44–1.00)
GFR calc Af Amer: >60 mL/min (ref >60)
GFR calc non Af Amer: >60 mL/min (ref >60)
Glucose, Bld: 87 mg/dL (ref 65–99)
Potassium: 3.6 mmol/L (ref 3.5–5.1)
Sodium: 135 mmol/L (ref 135–145)
Total Bilirubin: 0.3 mg/dL (ref 0.3–1.2)
Total Protein: 7.9 g/dL (ref 6.5–8.1)

## 2018-01-05 LAB — FERRITIN: Ferritin: 13 ng/mL (ref 11–307)

## 2018-01-05 LAB — FOLATE: Folate: 6 ng/mL (ref >5.9)

## 2018-01-05 LAB — CBC WITH DIFFERENTIAL/PLATELET
Basophils Absolute: 0 10*3/uL (ref 0–0.1)
Basophils Relative: 0 %
Eosinophils Absolute: 0 10*3/uL (ref 0–0.7)
Eosinophils Relative: 0 %
HCT: 41.9 % (ref 35.0–47.0)
Hemoglobin: 13.7 g/dL (ref 12.0–16.0)
Lymphocytes Relative: 14 %
Lymphs Abs: 1.3 10*3/uL (ref 1.0–3.6)
MCH: 27.6 pg (ref 26.0–34.0)
MCHC: 32.7 g/dL (ref 32.0–36.0)
MCV: 84.5 fL (ref 80.0–100.0)
Monocytes Absolute: 0.6 10*3/uL (ref 0.2–0.9)
Monocytes Relative: 6 %
Neutro Abs: 7.5 10*3/uL — ABNORMAL HIGH (ref 1.4–6.5)
Neutrophils Relative %: 80 %
Platelets: 244 10*3/uL (ref 150–440)
RBC: 4.96 MIL/uL (ref 3.80–5.20)
RDW: 15 % — ABNORMAL HIGH (ref 11.5–14.5)
WBC: 9.4 10*3/uL (ref 3.6–11.0)

## 2018-01-05 LAB — LIPID PANEL
Cholesterol: 282 mg/dL — ABNORMAL HIGH (ref 0–200)
HDL: 76 mg/dL (ref >40)
LDL Cholesterol: 155 mg/dL — ABNORMAL HIGH (ref 0–99)
Total CHOL/HDL Ratio: 3.7 RATIO
Triglycerides: 256 mg/dL — ABNORMAL HIGH (ref <150)
VLDL: 51 mg/dL — ABNORMAL HIGH (ref 0–40)

## 2018-01-05 LAB — T4, FREE: Free T4: 0.66 ng/dL — ABNORMAL LOW (ref 0.82–1.77)

## 2018-01-05 LAB — TSH: TSH: 0.51 u[IU]/mL (ref 0.350–4.500)

## 2018-01-05 LAB — URIC ACID: Uric Acid, Serum: 4.9 mg/dL (ref 2.3–6.6)

## 2018-01-05 LAB — SEDIMENTATION RATE: Sed Rate: 29 mm/hr (ref 0–30)

## 2018-01-05 NOTE — Progress Notes (Signed)
Pt was notfied

## 2018-01-06 LAB — VITAMIN B12: Vitamin B-12: 119 pg/mL — ABNORMAL LOW (ref 180–914)

## 2018-01-06 LAB — ANA W/REFLEX IF POSITIVE: Anti Nuclear Antibody(ANA): NEGATIVE

## 2018-01-06 LAB — FSH/LH
FSH: 60.4 m[IU]/mL
LH: 48.1 m[IU]/mL

## 2018-01-06 LAB — RHEUMATOID FACTOR: Rhuematoid fact SerPl-aCnc: 11.2 IU/mL (ref 0.0–13.9)

## 2018-01-06 LAB — ESTRADIOL: Estradiol: <5 pg/mL

## 2018-01-08 ENCOUNTER — Telehealth: Payer: Self-pay

## 2018-01-08 LAB — CALCITRIOL (1,25 DI-OH VIT D): Vit D, 1,25-Dihydroxy: 77.2 pg/mL (ref 19.9–79.3)

## 2018-01-08 NOTE — Telephone Encounter (Signed)
Try to call voice ail full

## 2018-01-08 NOTE — Progress Notes (Signed)
Pt was notified of lovw b12 and high cholesterol. Will mail prudent diet info.

## 2018-01-11 ENCOUNTER — Ambulatory Visit (INDEPENDENT_AMBULATORY_CARE_PROVIDER_SITE_OTHER): Payer: Managed Care, Other (non HMO)

## 2018-01-11 DIAGNOSIS — E538 Deficiency of other specified B group vitamins: Secondary | ICD-10-CM

## 2018-01-11 MED ORDER — CYANOCOBALAMIN 1000 MCG/ML IJ SOLN
1000.0000 ug | Freq: Once | INTRAMUSCULAR | Status: AC
  Administered 2018-01-11: 1000 ug via INTRAMUSCULAR

## 2018-01-14 DIAGNOSIS — N760 Acute vaginitis: Secondary | ICD-10-CM | POA: Insufficient documentation

## 2018-01-14 DIAGNOSIS — I10 Essential (primary) hypertension: Secondary | ICD-10-CM | POA: Insufficient documentation

## 2018-01-14 DIAGNOSIS — R300 Dysuria: Secondary | ICD-10-CM | POA: Insufficient documentation

## 2018-01-14 DIAGNOSIS — M792 Neuralgia and neuritis, unspecified: Secondary | ICD-10-CM | POA: Insufficient documentation

## 2018-01-14 DIAGNOSIS — N959 Unspecified menopausal and perimenopausal disorder: Secondary | ICD-10-CM | POA: Insufficient documentation

## 2018-01-14 DIAGNOSIS — E559 Vitamin D deficiency, unspecified: Secondary | ICD-10-CM | POA: Insufficient documentation

## 2018-01-14 DIAGNOSIS — R5383 Other fatigue: Secondary | ICD-10-CM | POA: Insufficient documentation

## 2018-01-14 DIAGNOSIS — R0789 Other chest pain: Secondary | ICD-10-CM | POA: Insufficient documentation

## 2018-01-18 ENCOUNTER — Ambulatory Visit (INDEPENDENT_AMBULATORY_CARE_PROVIDER_SITE_OTHER): Payer: Managed Care, Other (non HMO)

## 2018-01-18 DIAGNOSIS — E538 Deficiency of other specified B group vitamins: Secondary | ICD-10-CM

## 2018-01-18 MED ORDER — CYANOCOBALAMIN 1000 MCG/ML IJ SOLN
1000.0000 ug | Freq: Once | INTRAMUSCULAR | Status: AC
  Administered 2018-01-18: 1000 ug via INTRAMUSCULAR

## 2018-01-19 ENCOUNTER — Ambulatory Visit: Payer: Managed Care, Other (non HMO)

## 2018-01-19 DIAGNOSIS — R0602 Shortness of breath: Secondary | ICD-10-CM

## 2018-01-25 ENCOUNTER — Ambulatory Visit (INDEPENDENT_AMBULATORY_CARE_PROVIDER_SITE_OTHER): Payer: Managed Care, Other (non HMO)

## 2018-01-25 DIAGNOSIS — E538 Deficiency of other specified B group vitamins: Secondary | ICD-10-CM

## 2018-01-25 MED ORDER — CYANOCOBALAMIN 1000 MCG/ML IJ SOLN
1000.0000 ug | Freq: Once | INTRAMUSCULAR | Status: AC
  Administered 2018-01-25: 1000 ug via INTRAMUSCULAR

## 2018-01-26 ENCOUNTER — Encounter: Payer: Self-pay | Admitting: Obstetrics and Gynecology

## 2018-01-26 ENCOUNTER — Ambulatory Visit (INDEPENDENT_AMBULATORY_CARE_PROVIDER_SITE_OTHER): Payer: Managed Care, Other (non HMO) | Admitting: Obstetrics and Gynecology

## 2018-01-26 VITALS — BP 140/82 | HR 84 | Ht 66.5 in | Wt 164.0 lb

## 2018-01-26 DIAGNOSIS — R8789 Other abnormal findings in specimens from female genital organs: Secondary | ICD-10-CM

## 2018-01-26 DIAGNOSIS — R87618 Other abnormal cytological findings on specimens from cervix uteri: Secondary | ICD-10-CM

## 2018-01-26 NOTE — Progress Notes (Addendum)
Patient ID: Brittany Huynh, female   DOB: [birth year], 52 y.o.   MRN: [id number]  Reason for Consult: Abnormal Pap Smear [facility] medical referral)   Referred by Lyndon Code, MD  Subjective:     HPI:  Brittany Huynh is a 52 y.o. female . She had a NIL HPV positive pap smear on 01/02/2018. She reports that she has had abnormal pap smears but that they were in the remote past, more than 10 years ago. She may of have a colposcopy or a ablation before of her cervix but she is uncertain.  Records from her primary care physician were reviewed.   Past Medical History:  Diagnosis Date  . Asthma   . Breast fibrocystic disorder 2013  . Hernia 2011   chest wall  . Lung mass 2011  . Sarcoidosis   . Shortness of breath    2011   Family History  Problem Relation Age of Onset  . Liver cancer Mother   . Bone cancer Maternal Aunt   . Breast cancer Paternal Uncle   . Breast cancer Maternal Aunt    Past Surgical History:  Procedure Laterality Date  . ABDOMINAL HYSTERECTOMY  2009  . chamberlain procedure   2011  . LUNG BIOPSY  2011  . RESECTION RIBS EXTRAPLEURAL  [id number]    Short Social History:  Social History   Tobacco Use  . Smoking status: Never Smoker  . Smokeless tobacco: Never Used  Substance Use Topics  . Alcohol use: Yes    Comment: on occassion    Allergies  Allergen Reactions  . Onion Anaphylaxis  . Adhesive [Tape] Rash    Current Outpatient Medications  Medication Sig Dispense Refill  . mirtazapine (REMERON) 30 MG tablet Take 1 tablet (30 mg total) by mouth at bedtime. 30 tablet 3  . pramipexole (MIRAPEX) 1 MG tablet Take 1 tablet (1 mg total) by mouth at bedtime. 30 tablet 3  . predniSONE (DELTASONE) 1 MG tablet Take 2  Tablets daily 180 tablet 1  . predniSONE (DELTASONE) 10 MG tablet Take 1 tablet (10 mg total) by mouth daily. 30 tablet 2  . albuterol (PROVENTIL HFA;VENTOLIN HFA) 108 (90 BASE) MCG/ACT inhaler Inhale 2 puffs into the lungs every 6 (six) hours  as needed for wheezing.     Marland Kitchen aspirin-acetaminophen-caffeine (EXCEDRIN MIGRAINE) 250-250-65 MG per tablet Take 1 tablet by mouth every 6 (six) hours as needed for headache.     . benzonatate (TESSALON PERLES) 100 MG capsule Take 2 capsules (200 mg total) by mouth 3 (three) times daily as needed. (Patient not taking: Reported on 01/02/2018) 30 capsule 0  . budesonide-formoterol (SYMBICORT) 160-4.5 MCG/ACT inhaler Inhale 2 puffs into the lungs 2 (two) times daily.    . cyclobenzaprine (FLEXERIL) 10 MG tablet Take 1 tablet (10 mg total) by mouth 3 (three) times daily as needed for muscle spasms. (Patient not taking: Reported on 01/02/2018) 21 tablet 0  . ibuprofen (ADVIL,MOTRIN) 600 MG tablet Take 1 tablet (600 mg total) by mouth every 8 (eight) hours as needed. (Patient not taking: Reported on 01/26/2018) 15 tablet 0  . traMADol (ULTRAM) 50 MG tablet Take 1 tablet (50 mg total) by mouth every 6 (six) hours as needed. (Patient not taking: Reported on 01/02/2018) 20 tablet 0   No current facility-administered medications for this visit.     Review of Systems  Constitutional: Negative for chills, fatigue, fever and unexpected weight change.  HENT: Negative for trouble swallowing.  Eyes: Negative  for loss of vision.  Respiratory: Negative for cough, shortness of breath and wheezing.  Cardiovascular: Negative for chest pain, leg swelling, palpitations and syncope.  GI: Negative for abdominal pain, blood in stool, diarrhea, nausea and vomiting.  GU: Negative for difficulty urinating, dysuria, frequency and hematuria.  Musculoskeletal: Negative for back pain, leg pain and joint pain.  Skin: Negative for rash.  Neurological: Negative for dizziness, headaches, light-headedness, numbness and seizures.  Psychiatric: Negative for behavioral problem, confusion, depressed mood and sleep disturbance.        Objective:  Objective   Vitals:   01/26/18 1451  BP: 140/82  Pulse: 84  Weight: 164 lb (74.4 kg)    Height: 5' 6.5" (1.689 m)   Body mass index is 26.07 kg/m.  Physical Exam  Constitutional: She is oriented to person, place, and time. She appears well-developed and well-nourished.  HENT:  Head: Normocephalic and atraumatic.  Eyes: EOM are normal.  Cardiovascular: Normal rate, regular rhythm and normal heart sounds.  Pulmonary/Chest: Effort normal and breath sounds normal.  Genitourinary:  Genitourinary Comments: Cervix not seen on exam, vaginal cuff only. Vaginal cuff intact. No visible lesions seen or palpated.   Neurological: She is alert and oriented to person, place, and time.  Skin: Skin is warm and dry.  Psychiatric: She has a normal mood and affect. Her behavior is normal. Judgment and thought content normal.  Nursing note and vitals reviewed.       Assessment/Plan:     Patient was referred today for a colposcopy.  However on speculum exam the patient did not have a cervix.  Bimanual exam was performed and no cervix was palpated.  Vaginal cuff was palpated.  The patient reports that she did have a hysterectomy in 2009 by Dr. Tiburcio Pea.  She thought that her cervix was left, but that does not appear to be the case.  If patient did not have any cervical cancer changes greater than or equal to CIN 2 within 20 years of the time for hysterectomy then Pap smears can be discontinued.  Will attempt to obtain pathology report from hysterectomy.   More than 30 minutes were spent face to face with the patient in the room with more than 50% of the time spent providing counseling and discussing the plan of management. We discussed her pap smear results, colposcopy, and her surgical history.   Update: Pathology report from the hospital in 2009 did not show total hysterectomy with uterus and cervix removed. No abnormal cervical pathology. Scanned into media.   Adelene Idler MD [facility], [facility] 01/26/18 3:33 PM

## 2018-01-29 ENCOUNTER — Ambulatory Visit
Admission: RE | Admit: 2018-01-29 | Discharge: 2018-01-29 | Disposition: A | Discharge status: Home / Self Care | Payer: Managed Care, Other (non HMO) | Source: Ambulatory Visit | Attending: Nurse Practitioner | Admitting: Nurse Practitioner

## 2018-01-29 DIAGNOSIS — Z1231 Encounter for screening mammogram for malignant neoplasm of breast: Secondary | ICD-10-CM | POA: Diagnosis present

## 2018-01-29 DIAGNOSIS — Z1239 Encounter for other screening for malignant neoplasm of breast: Secondary | ICD-10-CM

## 2018-01-30 ENCOUNTER — Ambulatory Visit: Payer: Managed Care, Other (non HMO)

## 2018-01-30 DIAGNOSIS — E538 Deficiency of other specified B group vitamins: Secondary | ICD-10-CM | POA: Diagnosis not present

## 2018-01-30 MED ORDER — CYANOCOBALAMIN 1000 MCG/ML IJ SOLN
1000.0000 ug | Freq: Once | INTRAMUSCULAR | Status: AC
  Administered 2018-01-30: 1000 ug via INTRAMUSCULAR

## 2018-01-31 ENCOUNTER — Other Ambulatory Visit: Payer: Self-pay | Admitting: Nurse Practitioner

## 2018-01-31 DIAGNOSIS — N6320 Unspecified lump in the left breast, unspecified quadrant: Secondary | ICD-10-CM

## 2018-01-31 DIAGNOSIS — R928 Other abnormal and inconclusive findings on diagnostic imaging of breast: Secondary | ICD-10-CM

## 2018-02-01 ENCOUNTER — Ambulatory Visit: Payer: Self-pay

## 2018-02-05 ENCOUNTER — Ambulatory Visit
Admission: RE | Admit: 2018-02-05 | Discharge: 2018-02-05 | Disposition: A | Discharge status: Home / Self Care | Payer: Managed Care, Other (non HMO) | Source: Ambulatory Visit | Attending: Nurse Practitioner | Admitting: Nurse Practitioner

## 2018-02-05 DIAGNOSIS — R0789 Other chest pain: Secondary | ICD-10-CM | POA: Diagnosis not present

## 2018-02-05 DIAGNOSIS — D869 Sarcoidosis, unspecified: Secondary | ICD-10-CM | POA: Insufficient documentation

## 2018-02-05 DIAGNOSIS — R0602 Shortness of breath: Secondary | ICD-10-CM

## 2018-02-05 MED ORDER — IOPAMIDOL (ISOVUE-300) INJECTION 61%
75.0000 mL | Freq: Once | INTRAVENOUS | Status: AC | PRN
  Administered 2018-02-05: 75 mL via INTRAVENOUS

## 2018-02-08 ENCOUNTER — Ambulatory Visit (INDEPENDENT_AMBULATORY_CARE_PROVIDER_SITE_OTHER): Payer: Managed Care, Other (non HMO)

## 2018-02-08 DIAGNOSIS — E538 Deficiency of other specified B group vitamins: Secondary | ICD-10-CM

## 2018-02-08 MED ORDER — CYANOCOBALAMIN 1000 MCG/ML IJ SOLN
1000.0000 ug | Freq: Once | INTRAMUSCULAR | Status: AC
  Administered 2018-02-08: 1000 ug via INTRAMUSCULAR

## 2018-02-12 DIAGNOSIS — R200 Anesthesia of skin: Secondary | ICD-10-CM | POA: Insufficient documentation

## 2018-02-13 ENCOUNTER — Ambulatory Visit
Admission: RE | Admit: 2018-02-13 | Discharge: 2018-02-13 | Disposition: A | Discharge status: Home / Self Care | Payer: Managed Care, Other (non HMO) | Source: Ambulatory Visit | Attending: Nurse Practitioner | Admitting: Nurse Practitioner

## 2018-02-13 ENCOUNTER — Other Ambulatory Visit: Payer: Self-pay | Admitting: Nurse Practitioner

## 2018-02-13 DIAGNOSIS — N6320 Unspecified lump in the left breast, unspecified quadrant: Secondary | ICD-10-CM | POA: Insufficient documentation

## 2018-02-13 DIAGNOSIS — R928 Other abnormal and inconclusive findings on diagnostic imaging of breast: Secondary | ICD-10-CM | POA: Insufficient documentation

## 2018-02-14 ENCOUNTER — Ambulatory Visit: Payer: Managed Care, Other (non HMO)

## 2018-02-14 DIAGNOSIS — E538 Deficiency of other specified B group vitamins: Secondary | ICD-10-CM | POA: Diagnosis not present

## 2018-02-14 MED ORDER — CYANOCOBALAMIN 1000 MCG/ML IJ SOLN
1000.0000 ug | Freq: Once | INTRAMUSCULAR | Status: AC
  Administered 2018-02-14: 1000 ug via INTRAMUSCULAR

## 2018-02-15 ENCOUNTER — Telehealth: Payer: Self-pay | Admitting: Nurse Practitioner

## 2018-02-15 ENCOUNTER — Other Ambulatory Visit: Payer: Self-pay | Admitting: Nurse Practitioner

## 2018-02-15 ENCOUNTER — Ambulatory Visit: Payer: Self-pay

## 2018-02-15 ENCOUNTER — Ambulatory Visit
Admission: RE | Admit: 2018-02-15 | Discharge: 2018-02-15 | Disposition: A | Discharge status: Home / Self Care | Payer: Managed Care, Other (non HMO) | Source: Ambulatory Visit | Attending: Nurse Practitioner | Admitting: Nurse Practitioner

## 2018-02-15 DIAGNOSIS — R928 Other abnormal and inconclusive findings on diagnostic imaging of breast: Secondary | ICD-10-CM | POA: Insufficient documentation

## 2018-02-15 DIAGNOSIS — N6320 Unspecified lump in the left breast, unspecified quadrant: Secondary | ICD-10-CM

## 2018-02-15 DIAGNOSIS — N644 Mastodynia: Secondary | ICD-10-CM

## 2018-02-15 HISTORY — PX: BREAST BIOPSY: SHX20

## 2018-02-15 MED ORDER — ACETAMINOPHEN-CODEINE #3 300-30 MG PO TABS: 1.0000 | ORAL_TABLET | Freq: Three times a day (TID) | ORAL | 0 refills | Status: DC | PRN

## 2018-02-15 NOTE — Progress Notes (Signed)
Received call from radiologist at breast center. Patient having pain and bleeding following biopsy. Developed hematoma. Asked that something be called in for pain for next few days. Sent prescription for Tylenol with codeine - may be taken TID prn pain. Advise to use with caution. Do not drive after taking this medication due to cause of dizziness and drowsiness. Sent to [facility].

## 2018-02-15 NOTE — Telephone Encounter (Signed)
Informed pt that a prescription was sent to her pharmacy

## 2018-02-16 ENCOUNTER — Other Ambulatory Visit: Payer: Self-pay | Admitting: Nurse Practitioner

## 2018-02-16 ENCOUNTER — Ambulatory Visit: Payer: Managed Care, Other (non HMO)

## 2018-02-16 ENCOUNTER — Ambulatory Visit
Admission: RE | Admit: 2018-02-16 | Discharge: 2018-02-16 | Disposition: A | Discharge status: Home / Self Care | Payer: Managed Care, Other (non HMO) | Source: Ambulatory Visit | Attending: Nurse Practitioner | Admitting: Nurse Practitioner

## 2018-02-16 DIAGNOSIS — R928 Other abnormal and inconclusive findings on diagnostic imaging of breast: Secondary | ICD-10-CM

## 2018-02-16 DIAGNOSIS — Z9889 Other specified postprocedural states: Secondary | ICD-10-CM

## 2018-02-16 DIAGNOSIS — N6489 Other specified disorders of breast: Secondary | ICD-10-CM

## 2018-02-16 LAB — SURGICAL PATHOLOGY

## 2018-02-23 ENCOUNTER — Other Ambulatory Visit: Payer: Self-pay

## 2018-02-23 ENCOUNTER — Telehealth: Payer: Self-pay

## 2018-02-23 NOTE — Telephone Encounter (Signed)
Pt called concerned about her hematoma on her breast. She said [facility] told her to contact her PCP. Pt is worried because the hematoma is still there and requested to get an appt.  I spoke with Vincent Gros and she said that a hematoma will take a while to go away and that compression with heat and ice will help.  Pt was called and advised of the above.

## 2018-02-27 ENCOUNTER — Ambulatory Visit: Payer: Managed Care, Other (non HMO) | Admitting: Nurse Practitioner

## 2018-02-27 ENCOUNTER — Encounter: Payer: Self-pay | Admitting: Nurse Practitioner

## 2018-02-27 VITALS — BP 160/99 | HR 95 | Resp 16 | Ht 66.0 in | Wt 166.0 lb

## 2018-02-27 DIAGNOSIS — N644 Mastodynia: Secondary | ICD-10-CM

## 2018-02-27 DIAGNOSIS — R0602 Shortness of breath: Secondary | ICD-10-CM

## 2018-02-27 DIAGNOSIS — N6320 Unspecified lump in the left breast, unspecified quadrant: Secondary | ICD-10-CM

## 2018-02-27 DIAGNOSIS — I10 Essential (primary) hypertension: Secondary | ICD-10-CM | POA: Diagnosis not present

## 2018-02-27 DIAGNOSIS — D860 Sarcoidosis of lung: Secondary | ICD-10-CM

## 2018-02-27 MED ORDER — TRAMADOL HCL 50 MG PO TABS: 50.0000 mg | ORAL_TABLET | Freq: Four times a day (QID) | ORAL | 1 refills | Status: DC | PRN

## 2018-02-27 MED ORDER — PREDNISONE 10 MG PO TABS: 10.0000 mg | ORAL_TABLET | Freq: Every day | ORAL | 3 refills | Status: DC

## 2018-02-27 MED ORDER — PREDNISONE 1 MG PO TABS: ORAL_TABLET | ORAL | 3 refills | Status: DC

## 2018-02-27 NOTE — Progress Notes (Signed)
[facility] [address] [place], [place] [address]  Internal MEDICINE  Office Visit Note  Patient Name: Brittany Huynh  [id number]  [id number]  Date of Service: 02/28/2018   Pt is here for routine follow up.    Chief Complaint  Patient presents with  . Breast Pain    recent biopsy of left breast    Patient had biopsy done on left breast on 02/16/2018. During the biopsy she developed a large and tender hematoma. Initially, she was treated with tylenol with codeine to help with pain and advised to ice the affected area. After a few days, she had to return to the breast center and this large hematoma had to be drained. There was still a fairly large remaining mass after this was drained. She had significant brusing which continued to worsen. When she continued to have pain and swelling after that, she was told to see her PCP. Left breast is still very swollen with mass area going under the left breast. Hurts to put her arm down due to mass. 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 She also states that she has significant bruising on the left breast. She feels that the bruising continues to get worse.  The patient did have echocardiogram done since her last visit. Normal systolic function. She does have grade 2 diastolic dysfunction. There is trace valvular regurgitation.      Current Medication: Outpatient Encounter Medications as of 02/27/2018  Medication Sig  . albuterol (PROVENTIL HFA;VENTOLIN HFA) 108 (90 BASE) MCG/ACT inhaler Inhale 2 puffs into the lungs every 6 (six) hours as needed for wheezing.   Marland Kitchen aspirin-acetaminophen-caffeine (EXCEDRIN MIGRAINE) 250-250-65 MG per tablet Take 1 tablet by mouth every 6 (six) hours as needed for headache.   . budesonide-formoterol (SYMBICORT) 160-4.5 MCG/ACT inhaler Inhale 2 puffs into the lungs 2 (two) times daily.  . mirtazapine (REMERON) 30 MG tablet Take 1 tablet (30 mg  total) by mouth at bedtime.  . pramipexole (MIRAPEX) 1 MG tablet Take 1 tablet (1 mg total) by mouth at bedtime.  . predniSONE (DELTASONE) 1 MG tablet Take 2  Tablets daily  . predniSONE (DELTASONE) 10 MG tablet Take 1 tablet (10 mg total) by mouth daily.  . [DISCONTINUED] predniSONE (DELTASONE) 1 MG tablet Take 2  Tablets daily  . [DISCONTINUED] predniSONE (DELTASONE) 10 MG tablet Take 1 tablet (10 mg total) by mouth daily.  Marland Kitchen acetaminophen-codeine (TYLENOL #3) 300-30 MG tablet Take 1 tablet by mouth every 8 (eight) hours as needed for moderate pain. (Patient not taking: Reported on 02/27/2018)  . benzonatate (TESSALON PERLES) 100 MG capsule Take 2 capsules (200 mg total) by mouth 3 (three) times daily as needed. (Patient not taking: Reported on 01/02/2018)  . cyclobenzaprine (FLEXERIL) 10 MG tablet Take 1 tablet (10 mg total) by mouth 3 (three) times daily as needed for muscle spasms. (Patient not taking: Reported on 01/02/2018)  . ibuprofen (ADVIL,MOTRIN) 600 MG tablet Take 1 tablet (600 mg total) by mouth every 8 (eight) hours as needed. (Patient not taking: Reported on 01/26/2018)  . traMADol (ULTRAM) 50 MG tablet Take 1 tablet (50 mg total) by mouth every 6 (six) hours as needed.  . [DISCONTINUED] traMADol (ULTRAM) 50 MG tablet Take 1 tablet (50 mg total) by mouth every 6 (six) hours as needed. (Patient not taking: Reported on 01/02/2018)   No facility-administered encounter medications on file as of 02/27/2018.  Surgical History: Past Surgical History:  Procedure Laterality Date  . ABDOMINAL HYSTERECTOMY  2009  . chamberlain procedure   2011  . LUNG BIOPSY  2011  . RESECTION RIBS EXTRAPLEURAL  [id number]    Medical History: Past Medical History:  Diagnosis Date  . Asthma   . Breast fibrocystic disorder 2013  . Hernia 2011   chest wall  . Lung mass 2011  . Sarcoidosis   . Shortness of breath    2011    Family History: Family History  Problem Relation Age of Onset  . Liver cancer  Mother   . Bone cancer Maternal Aunt   . Breast cancer Maternal Aunt   . Breast cancer Paternal Aunt     Social History   Socioeconomic History  . Marital status: Single    Spouse name: Not on file  . Number of children: Not on file  . Years of education: Not on file  . Highest education level: Not on file  Occupational History  . Not on file  Social Needs  . Financial resource strain: Not on file  . Food insecurity:    Worry: Not on file    Inability: Not on file  . Transportation needs:    Medical: Not on file    Non-medical: Not on file  Tobacco Use  . Smoking status: Never Smoker  . Smokeless tobacco: Never Used  Substance and Sexual Activity  . Alcohol use: Yes    Comment: on occassion  . Drug use: No  . Sexual activity: Not on file  Lifestyle  . Physical activity:    Days per week: Not on file    Minutes per session: Not on file  . Stress: Not on file  Relationships  . Social connections:    Talks on phone: Not on file    Gets together: Not on file    Attends religious service: Not on file    Active member of club or organization: Not on file    Attends meetings of clubs or organizations: Not on file    Relationship status: Not on file  . Intimate partner violence:    Fear of current or ex partner: Not on file    Emotionally abused: Not on file    Physically abused: Not on file    Forced sexual activity: Not on file  Other Topics Concern  . Not on file  Social History Narrative  . Not on file      Review of Systems  Constitutional: Positive for activity change and fatigue. Negative for chills and unexpected weight change.  HENT: Negative for congestion, postnasal drip, rhinorrhea, sneezing and sore throat.   Eyes: Negative.  Negative for redness.  Respiratory: Positive for chest tightness and shortness of breath. Negative for cough and wheezing.   Cardiovascular: Positive for chest pain and leg swelling. Negative for palpitations.    Gastrointestinal: Negative for abdominal pain, constipation, diarrhea, nausea and vomiting.  Endocrine: Negative for cold intolerance, heat intolerance, polydipsia, polyphagia and polyuria.  Genitourinary: Negative.  Negative for difficulty urinating, dyspareunia, dysuria, flank pain and frequency.  Musculoskeletal: Positive for arthralgias and myalgias. Negative for back pain, joint swelling and neck pain.  Skin: Negative for rash.       Significant bruising with hematoma outer aspect of left breast and under the left arm.   Allergic/Immunologic: Positive for environmental allergies.  Neurological: Positive for dizziness, weakness and headaches. Negative for tremors and numbness.  Hematological: Negative for adenopathy. Does not bruise/bleed  easily.  Psychiatric/Behavioral: Positive for dysphoric mood and sleep disturbance. Negative for behavioral problems (Depression) and suicidal ideas. The patient is nervous/anxious.    Today's Vitals   02/27/18 1456  BP: (!) 160/99  Pulse: 95  Resp: 16  SpO2: 99%  Weight: 166 lb (75.3 kg)  Height: 5\' 6"  (1.676 m)    Physical Exam  Constitutional: She is oriented to person, place, and time. She appears well-developed and well-nourished. She appears distressed.  HENT:  Head: Normocephalic and atraumatic.  Mouth/Throat: Oropharynx is clear and moist. No oropharyngeal exudate.  Eyes: Pupils are equal, round, and reactive to light. EOM are normal.  Neck: Normal range of motion. Neck supple. No JVD present. No tracheal deviation present. No thyromegaly present.  Cardiovascular: Normal rate, regular rhythm and normal heart sounds. Exam reveals no gallop and no friction rub.  No murmur heard. Pulmonary/Chest: Effort normal and breath sounds normal. No respiratory distress. She has no wheezes. She has no rales. She exhibits no tenderness.    Abdominal: Soft. Bowel sounds are normal.  Musculoskeletal: Normal range of motion.  Lymphadenopathy:    She  has no cervical adenopathy.  Neurological: She is alert and oriented to person, place, and time. No cranial nerve deficit.  Skin: Skin is warm and dry. She is not diaphoretic.  Psychiatric: Her speech is normal and behavior is normal. Judgment and thought content normal. Her mood appears anxious. Cognition and memory are normal. She exhibits a depressed mood.  Nursing note and vitals reviewed.  Assessment/Plan: 1. Large mass of left breast Presumably hematoma from biopsy done 02/16/2018. Will refer urgently to general surgery for further evaluation and treatment.  - Ambulatory referral to General Surgery - traMADol (ULTRAM) 50 MG tablet; Take 1 tablet (50 mg total) by mouth every 6 (six) hours as needed.  Dispense: 30 tablet; Refill: 1  2. Breast pain, left Short term prescription given for tramadol 50mg . This may be taken up to every 6 hour as needed for pain. Advised she apply warm compress to breast in 20 minute intervals to help promote healing.  - traMADol (ULTRAM) 50 MG tablet; Take 1 tablet (50 mg total) by mouth every 6 (six) hours as needed.  Dispense: 30 tablet; Refill: 1  3. Shortness of breath Reviewed echo results with patient. Did show normal systolic function with grade 2 diastolic dysfunction and mild valvular regurgitation. Will repeat echo in 1 year.   4. Sarcoidosis of lung (HCC) Continue prescribed daily dosing of prednisone. Regular visits with pulmonology as scheduled.  - predniSONE (DELTASONE) 1 MG tablet; Take 2  Tablets daily  Dispense: 60 tablet; Refill: 3 - predniSONE (DELTASONE) 10 MG tablet; Take 1 tablet (10 mg total) by mouth daily.  Dispense: 30 tablet; Refill: 3  5. Essential hypertension bp elevated today, but this is isolated event. Will monitor closely and treat as indicated.   General Counseling: demetric parslow understanding of the findings of todays visit and agrees with plan of treatment. I have discussed any further diagnostic evaluation that may be  needed or ordered today. We also reviewed her medications today. she has been encouraged to call the office with any questions or concerns that should arise related to todays visit.    Counseling:  This patient was seen by , FNP- C in Collaboration with Dr Val Eagle as a part of collaborative care agreement  Orders Placed This Encounter  Procedures  . Ambulatory referral to General Surgery    Meds ordered this encounter  Medications  . traMADol (ULTRAM) 50 MG tablet    Sig: Take 1 tablet (50 mg total) by mouth every 6 (six) hours as needed.    Dispense:  30 tablet    Refill:  1    Order Specific Question:   Supervising Provider    Answer:   Lyndon Code [[id number]]  . predniSONE (DELTASONE) 1 MG tablet    Sig: Take 2  Tablets daily    Dispense:  60 tablet    Refill:  3    Order Specific Question:   Supervising Provider    Answer:   Lyndon Code [[id number]]  . predniSONE (DELTASONE) 10 MG tablet    Sig: Take 1 tablet (10 mg total) by mouth daily.    Dispense:  30 tablet    Refill:  3    Order Specific Question:   Supervising Provider    Answer:   Lyndon Code [[id number]]    Time spent: 10 Minutes       Dr Lyndon Code Internal medicine

## 2018-02-28 ENCOUNTER — Encounter: Payer: Self-pay | Admitting: General Surgery

## 2018-02-28 ENCOUNTER — Ambulatory Visit: Payer: Self-pay

## 2018-02-28 ENCOUNTER — Ambulatory Visit (INDEPENDENT_AMBULATORY_CARE_PROVIDER_SITE_OTHER): Payer: Managed Care, Other (non HMO) | Admitting: General Surgery

## 2018-02-28 VITALS — BP 132/88 | HR 82 | Resp 14 | Ht 66.0 in | Wt 163.0 lb

## 2018-02-28 DIAGNOSIS — S2002XA Contusion of left breast, initial encounter: Secondary | ICD-10-CM

## 2018-02-28 DIAGNOSIS — D869 Sarcoidosis, unspecified: Secondary | ICD-10-CM

## 2018-02-28 DIAGNOSIS — N644 Mastodynia: Secondary | ICD-10-CM | POA: Insufficient documentation

## 2018-02-28 NOTE — Progress Notes (Signed)
Patient ID: Brittany Huynh, female   DOB: 05-08-1966, 52 y.o.   MRN: [id number]  Chief Complaint  Patient presents with  . Breast Problem    HPI JALEYA Huynh is a 52 y.o. female.  who presents for a breast evaluation former patient of Dr Evette Cristal. The most recent mammogram was done on 02-13-18. Left breast biopsy was 02-15-18. A hematoma developed, [place] drained the area 02-16-18. Vincent Gros NP felt something in the breast at a routine physical. She does admit to her breast aching and burning on and off for 3 months. Denies any breast injury or trauma.  Patient does not perform regular self breast checks and gets regular mammograms done.   She states the breast really hurts and is tender. 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  HPI  Past Medical History:  Diagnosis Date  . Asthma   . Breast fibrocystic disorder 2013  . Hernia 2011   chest wall  . Lung mass 2011  . Sarcoidosis   . Shortness of breath    2011    Past Surgical History:  Procedure Laterality Date  . ABDOMINAL HYSTERECTOMY  2009  . BREAST BIOPSY Left 02/15/2018   PREDOMINANTLY MATURE ADIPOSE TISSUE WITH VERY FOCAL AREAS OF BENIGN   . chamberlain procedure   2011  . LUNG BIOPSY  2011  . RESECTION RIBS EXTRAPLEURAL  [id number]    Family History  Problem Relation Age of Onset  . Liver cancer Mother 12  . Bone cancer Maternal Aunt 60  . Breast cancer Maternal Aunt   . Breast cancer Paternal Aunt   . Bone cancer Paternal Aunt 48    Social History Social History   Tobacco Use  . Smoking status: Never Smoker  . Smokeless tobacco: Never Used  Substance Use Topics  . Alcohol use: Yes    Comment: on occassion  . Drug use: No    Allergies  Allergen Reactions  . Onion Anaphylaxis  . Adhesive [Tape] Rash    Current Outpatient Medications  Medication Sig Dispense Refill  . albuterol (PROVENTIL HFA;VENTOLIN HFA) 108 (90 BASE) MCG/ACT inhaler Inhale 2 puffs into the lungs every 6 (six) hours as  needed for wheezing.     Marland Kitchen aspirin-acetaminophen-caffeine (EXCEDRIN MIGRAINE) 250-250-65 MG per tablet Take 1 tablet by mouth every 6 (six) hours as needed for headache.     . mirtazapine (REMERON) 30 MG tablet Take 1 tablet (30 mg total) by mouth at bedtime. 30 tablet 3  . predniSONE (DELTASONE) 1 MG tablet Take 2  Tablets daily 60 tablet 3  . predniSONE (DELTASONE) 10 MG tablet Take 1 tablet (10 mg total) by mouth daily. 30 tablet 3  . pramipexole (MIRAPEX) 1 MG tablet Take 1 tablet (1 mg total) by mouth at bedtime. (Patient not taking: Reported on 02/28/2018) 30 tablet 3  . traMADol (ULTRAM) 50 MG tablet Take 1 tablet (50 mg total) by mouth every 6 (six) hours as needed. (Patient not taking: Reported on 02/28/2018) 30 tablet 1   No current facility-administered medications for this visit.     Review of Systems Review of Systems  Constitutional: Negative.   Respiratory: Negative.   Cardiovascular: Negative.     Blood pressure 132/88, pulse 82, resp. rate 14, height 5\' 6"  (1.676 m), weight 163 lb (73.9 kg).  Physical Exam Physical Exam  Constitutional: She is oriented to person, place, and time. She appears well-developed and well-nourished.  HENT:  Mouth/Throat: Oropharynx is clear and moist.  Eyes:  Conjunctivae are normal. No scleral icterus.  Neck: Neck supple.  Cardiovascular: Normal rate, regular rhythm and normal heart sounds.  Pulmonary/Chest: Effort normal and breath sounds normal. Left breast exhibits skin change and tenderness. Left breast exhibits no inverted nipple, no mass and no nipple discharge.  8 x 9 cm swelling left breast. 24 ml aspiration    Lymphadenopathy:    She has no cervical adenopathy.    She has no axillary adenopathy.  Neurological: She is alert and oriented to person, place, and time.  Skin: Skin is warm and dry.  Psychiatric: Her behavior is normal.    Data Reviewed Left breast biopsy dated February 15, 2018: DIAGNOSIS:  A. BREAST, LEFT 2:00 7 CM FN;  ULTRASOUND-GUIDED BIOPSY:  - PREDOMINANTLY MATURE ADIPOSE TISSUE WITH VERY FOCAL AREAS OF BENIGN  MAMMARY EPITHELIUM.  - NEGATIVE FOR ATYPIA AND MALIGNANCY.   The patient underwent ultrasound-guided placement of a 12 French drain in the hematoma cavity on postprocedure day 1, February 16, 2018 with a return of 30 cc of thick blood.  "Small soft tissue hematoma remains following the procedure".  Ultrasound examination of the left breast was undertaken to determine whether the patient would be candidate for repeat aspiration.  This showed a essentially anechoic area with dense posterior acoustic enhancement measuring 2.89 x 7.2 x 7.3 cm.  The patient was amenable to aspiration.  Alcohol followed by 1 cc of 1% Xylocaine and ChloraPrep was completed.  Making use of a 20-gauge needle 24 cc of old hematoma was evacuated with marked softening of the hematoma site.  The procedure was well-tolerated.  Assessment    Pronounced hematoma post biopsy.    Plan    The patient has been encouraged to make use of local heat rather than ice at this time.  This may help accelerate resolution of the hematoma and the extensive ecchymosis present.  Good support and nighttime bra use is encouraged.  Will reassess in 6 days.  If the area is not significantly better or amenable to repeat aspiration the patient will be a candidate for operative drainage.   Follow up in 6 days    HPI, Physical Exam, Assessment and Plan have been scribed under the direction and in the presence of Earline Mayotte, MD. Dorathy Daft, RN  I have completed the exam and reviewed the above documentation for accuracy and completeness.  I agree with the above.  Museum/gallery conservator has been used and any errors in dictation or transcription are unintentional.  Donnalee Curry, M.D., F.A.C.S.   Merrily Pew Chamika Cunanan 02/28/2018, 5:14 PM

## 2018-02-28 NOTE — Patient Instructions (Addendum)
The patient is aware to call back for any questions or concerns. The patient is aware to use a heating pad as needed for comfort.  

## 2018-03-06 ENCOUNTER — Encounter: Payer: Self-pay | Admitting: General Surgery

## 2018-03-06 ENCOUNTER — Ambulatory Visit (INDEPENDENT_AMBULATORY_CARE_PROVIDER_SITE_OTHER): Payer: Managed Care, Other (non HMO) | Admitting: General Surgery

## 2018-03-06 VITALS — BP 108/62 | HR 108 | Resp 12 | Ht 66.0 in | Wt 161.0 lb

## 2018-03-06 DIAGNOSIS — S2002XD Contusion of left breast, subsequent encounter: Secondary | ICD-10-CM | POA: Diagnosis not present

## 2018-03-06 NOTE — Patient Instructions (Addendum)
The patient is aware to call back for any questions or new concerns.  Recommend surgery  The patient is scheduled for surgery at [facility] tomorrow 03/07/18. She will pre admit at the hospital 2 hours prior. She will have nothing to eat after midnight tonight. She will arrive at Pre admit testing at 11:00 am and have nothing to drink for 2 hours prior to that. She is aware of date, time, and instructions.

## 2018-03-06 NOTE — Progress Notes (Signed)
Patient ID: Brittany Huynh, female   DOB: 1966-06-05, 52 y.o.   MRN: [id number]  Chief Complaint  Patient presents with  . Follow-up    HPI Brittany Huynh is a 52 y.o. female.  Here for follow up left breast hematoma post breast biopsy. She states she has been using the heat and the area feels a little soft in some areas. Some modest relief w/ aspiration last week.  HPI  Past Medical History:  Diagnosis Date  . Asthma   . Breast fibrocystic disorder 2013  . Hernia 2011   chest wall  . Lung mass 2011  . Sarcoidosis   . Shortness of breath    2011    Past Surgical History:  Procedure Laterality Date  . ABDOMINAL HYSTERECTOMY  2009  . BREAST BIOPSY Left 02/15/2018   PREDOMINANTLY MATURE ADIPOSE TISSUE WITH VERY FOCAL AREAS OF BENIGN   . chamberlain procedure   2011  . LUNG BIOPSY  2011  . RESECTION RIBS EXTRAPLEURAL  65993    Family History  Problem Relation Age of Onset  . Liver cancer Mother 75  . Bone cancer Maternal Aunt 60  . Breast cancer Maternal Aunt   . Breast cancer Paternal Aunt   . Bone cancer Paternal Aunt 78    Social History Social History   Tobacco Use  . Smoking status: Never Smoker  . Smokeless tobacco: Never Used  Substance Use Topics  . Alcohol use: Yes    Comment: on occassion  . Drug use: No    Allergies  Allergen Reactions  . Onion Anaphylaxis  . Adhesive [Tape] Rash    Current Outpatient Medications  Medication Sig Dispense Refill  . albuterol (PROVENTIL HFA;VENTOLIN HFA) 108 (90 BASE) MCG/ACT inhaler Inhale 2 puffs into the lungs every 6 (six) hours as needed for wheezing.     Marland Kitchen aspirin-acetaminophen-caffeine (EXCEDRIN MIGRAINE) 250-250-65 MG per tablet Take 1 tablet by mouth every 6 (six) hours as needed for headache.     . mirtazapine (REMERON) 30 MG tablet Take 1 tablet (30 mg total) by mouth at bedtime. 30 tablet 3  . pramipexole (MIRAPEX) 1 MG tablet Take 1 tablet (1 mg total) by mouth at bedtime. 30 tablet 3  .  predniSONE (DELTASONE) 1 MG tablet Take 2  Tablets daily 60 tablet 3  . predniSONE (DELTASONE) 10 MG tablet Take 1 tablet (10 mg total) by mouth daily. 30 tablet 3  . traMADol (ULTRAM) 50 MG tablet Take 1 tablet (50 mg total) by mouth every 6 (six) hours as needed. 30 tablet 1   No current facility-administered medications for this visit.     Review of Systems Review of Systems  Constitutional: Negative.   Respiratory: Negative.   Cardiovascular: Negative.     Blood pressure 108/62, pulse (!) 108, resp. rate 12, height 5\' 6"  (1.676 m), weight 161 lb (73 kg), SpO2 98 %.  Physical Exam Physical Exam  Constitutional: She is oriented to person, place, and time. She appears well-developed and well-nourished.  HENT:  Mouth/Throat: Oropharynx is clear and moist.  Eyes: Conjunctivae are normal.  Neck: Neck supple.  Cardiovascular: Normal rate, regular rhythm and normal heart sounds.  Pulmonary/Chest: Effort normal and breath sounds normal.    Neurological: She is alert and oriented to person, place, and time.  Skin: Skin is warm and dry.  Psychiatric: Her behavior is normal.       Assessment    Large hematoma s/p vacuum biopsy.    Plan  Recommend surgery to evacuate the hematoma. May require a drain post op.  Will complete under propofol sedation in the OR.      HPI, Physical Exam, Assessment and Plan have been scribed under the direction and in the presence of Earline Mayotte, MD. Dorathy Daft, RN  I have completed the exam and reviewed the above documentation for accuracy and completeness.  I agree with the above.  Museum/gallery conservator has been used and any errors in dictation or transcription are unintentional.  Donnalee Curry, M.D., F.A.C.S.  The patient is scheduled for surgery at [facility] tomorrow 03/07/18. She will pre admit at the hospital 2 hours prior. She will have nothing to eat after midnight tonight. She will arrive at Pre admit testing at 11:00 am and have  nothing to drink for 2 hours prior to that. She is aware of date, time, and instructions.  Documented by Caryl-Lyn Louanna Raw LPN  Merrily Pew Richardine Peppers 03/06/2018, 9:12 AM

## 2018-03-07 ENCOUNTER — Ambulatory Visit: Payer: Managed Care, Other (non HMO) | Admitting: Anesthesiology

## 2018-03-07 ENCOUNTER — Ambulatory Visit
Admission: RE | Admit: 2018-03-07 | Discharge: 2018-03-07 | Disposition: A | Discharge status: Home / Self Care | Payer: Managed Care, Other (non HMO) | Source: Ambulatory Visit | Attending: General Surgery | Admitting: General Surgery

## 2018-03-07 ENCOUNTER — Encounter
Admission: RE | Disposition: A | Discharge status: Home / Self Care | Payer: Self-pay | Source: Ambulatory Visit | Attending: General Surgery

## 2018-03-07 ENCOUNTER — Other Ambulatory Visit: Payer: Self-pay

## 2018-03-07 DIAGNOSIS — S2002XA Contusion of left breast, initial encounter: Secondary | ICD-10-CM | POA: Diagnosis not present

## 2018-03-07 DIAGNOSIS — I10 Essential (primary) hypertension: Secondary | ICD-10-CM | POA: Diagnosis not present

## 2018-03-07 DIAGNOSIS — F419 Anxiety disorder, unspecified: Secondary | ICD-10-CM | POA: Diagnosis not present

## 2018-03-07 DIAGNOSIS — D869 Sarcoidosis, unspecified: Secondary | ICD-10-CM | POA: Diagnosis not present

## 2018-03-07 DIAGNOSIS — Z7952 Long term (current) use of systemic steroids: Secondary | ICD-10-CM | POA: Insufficient documentation

## 2018-03-07 DIAGNOSIS — Z803 Family history of malignant neoplasm of breast: Secondary | ICD-10-CM | POA: Diagnosis not present

## 2018-03-07 DIAGNOSIS — L7632 Postprocedural hematoma of skin and subcutaneous tissue following other procedure: Secondary | ICD-10-CM | POA: Diagnosis not present

## 2018-03-07 DIAGNOSIS — J45909 Unspecified asthma, uncomplicated: Secondary | ICD-10-CM | POA: Diagnosis not present

## 2018-03-07 DIAGNOSIS — S2002XD Contusion of left breast, subsequent encounter: Secondary | ICD-10-CM

## 2018-03-07 DIAGNOSIS — Y838 Other surgical procedures as the cause of abnormal reaction of the patient, or of later complication, without mention of misadventure at the time of the procedure: Secondary | ICD-10-CM | POA: Diagnosis not present

## 2018-03-07 DIAGNOSIS — F329 Major depressive disorder, single episode, unspecified: Secondary | ICD-10-CM | POA: Insufficient documentation

## 2018-03-07 HISTORY — PX: IRRIGATION AND DEBRIDEMENT HEMATOMA: SHX5254

## 2018-03-07 SURGERY — IRRIGATION AND DEBRIDEMENT HEMATOMA
Anesthesia: General | Laterality: Left | Wound class: Clean

## 2018-03-07 MED ORDER — PROPOFOL 500 MG/50ML IV EMUL
INTRAVENOUS | Status: DC | PRN
  Administered 2018-03-07: 50 ug/kg/min via INTRAVENOUS

## 2018-03-07 MED ORDER — ALBUTEROL SULFATE HFA 108 (90 BASE) MCG/ACT IN AERS
INHALATION_SPRAY | RESPIRATORY_TRACT | Status: AC
  Filled 2018-03-07: qty 6.7

## 2018-03-07 MED ORDER — HYDROCODONE-ACETAMINOPHEN 5-325 MG PO TABS: 1.0000 | ORAL_TABLET | ORAL | 0 refills | Status: DC | PRN

## 2018-03-07 MED ORDER — LACTATED RINGERS IV SOLN
INTRAVENOUS | Status: DC
  Administered 2018-03-07: 12:00:00 via INTRAVENOUS

## 2018-03-07 MED ORDER — CEFAZOLIN SODIUM-DEXTROSE 2-4 GM/100ML-% IV SOLN
2.0000 g | INTRAVENOUS | Status: AC
  Administered 2018-03-07: 2 g via INTRAVENOUS

## 2018-03-07 MED ORDER — ACETAMINOPHEN 325 MG PO TABS: 325.0000 mg | ORAL_TABLET | ORAL | Status: DC | PRN

## 2018-03-07 MED ORDER — FENTANYL CITRATE (PF) 100 MCG/2ML IJ SOLN
INTRAMUSCULAR | Status: DC | PRN
  Administered 2018-03-07 (×2): 50 ug via INTRAVENOUS

## 2018-03-07 MED ORDER — LIDOCAINE HCL 1 % IJ SOLN
INTRAMUSCULAR | Status: DC | PRN
  Administered 2018-03-07: 4 mL

## 2018-03-07 MED ORDER — FENTANYL CITRATE (PF) 100 MCG/2ML IJ SOLN
INTRAMUSCULAR | Status: AC
  Filled 2018-03-07: qty 2

## 2018-03-07 MED ORDER — FENTANYL CITRATE (PF) 100 MCG/2ML IJ SOLN
INTRAMUSCULAR | Status: AC
  Administered 2018-03-07: 25 ug via INTRAVENOUS
  Filled 2018-03-07: qty 2

## 2018-03-07 MED ORDER — MIDAZOLAM HCL 2 MG/2ML IJ SOLN
INTRAMUSCULAR | Status: DC | PRN
  Administered 2018-03-07: 2 mg via INTRAVENOUS

## 2018-03-07 MED ORDER — BUPIVACAINE HCL (PF) 0.5 % IJ SOLN
INTRAMUSCULAR | Status: AC
  Filled 2018-03-07: qty 30

## 2018-03-07 MED ORDER — HYDROCODONE-ACETAMINOPHEN 7.5-325 MG PO TABS
1.0000 | ORAL_TABLET | Freq: Once | ORAL | Status: DC | PRN
  Filled 2018-03-07: qty 1

## 2018-03-07 MED ORDER — LIDOCAINE HCL (PF) 1 % IJ SOLN
INTRAMUSCULAR | Status: AC
  Filled 2018-03-07: qty 30

## 2018-03-07 MED ORDER — LIDOCAINE HCL (CARDIAC) PF 100 MG/5ML IV SOSY
PREFILLED_SYRINGE | INTRAVENOUS | Status: DC | PRN
  Administered 2018-03-07: 80 mg via INTRAVENOUS

## 2018-03-07 MED ORDER — SODIUM CHLORIDE FLUSH 0.9 % IV SOLN
INTRAVENOUS | Status: AC
  Filled 2018-03-07: qty 10

## 2018-03-07 MED ORDER — LIDOCAINE-EPINEPHRINE 1 %-1:100000 IJ SOLN
INTRAMUSCULAR | Status: AC
  Filled 2018-03-07: qty 1

## 2018-03-07 MED ORDER — MEPERIDINE HCL 50 MG/ML IJ SOLN: 6.2500 mg | INTRAMUSCULAR | Status: DC | PRN

## 2018-03-07 MED ORDER — FENTANYL CITRATE (PF) 100 MCG/2ML IJ SOLN
25.0000 ug | INTRAMUSCULAR | Status: DC | PRN
  Administered 2018-03-07 (×4): 25 ug via INTRAVENOUS

## 2018-03-07 MED ORDER — ACETAMINOPHEN 10 MG/ML IV SOLN
INTRAVENOUS | Status: AC
  Filled 2018-03-07: qty 100

## 2018-03-07 MED ORDER — PROMETHAZINE HCL 25 MG/ML IJ SOLN
INTRAMUSCULAR | Status: AC
  Administered 2018-03-07: 6.25 mg via INTRAVENOUS
  Filled 2018-03-07: qty 1

## 2018-03-07 MED ORDER — CEFAZOLIN SODIUM-DEXTROSE 2-4 GM/100ML-% IV SOLN
INTRAVENOUS | Status: AC
  Filled 2018-03-07: qty 100

## 2018-03-07 MED ORDER — SODIUM BICARBONATE 4 % IV SOLN
INTRAVENOUS | Status: AC
  Filled 2018-03-07: qty 5

## 2018-03-07 MED ORDER — FAMOTIDINE 20 MG PO TABS
20.0000 mg | ORAL_TABLET | Freq: Once | ORAL | Status: AC
  Administered 2018-03-07: 20 mg via ORAL

## 2018-03-07 MED ORDER — PROMETHAZINE HCL 25 MG/ML IJ SOLN
6.2500 mg | INTRAMUSCULAR | Status: AC | PRN
  Administered 2018-03-07 (×2): 6.25 mg via INTRAVENOUS

## 2018-03-07 MED ORDER — FAMOTIDINE 20 MG PO TABS
ORAL_TABLET | ORAL | Status: AC
  Administered 2018-03-07: 20 mg via ORAL
  Filled 2018-03-07: qty 1

## 2018-03-07 MED ORDER — PROPOFOL 10 MG/ML IV BOLUS
INTRAVENOUS | Status: AC
  Filled 2018-03-07: qty 20

## 2018-03-07 MED ORDER — MIDAZOLAM HCL 2 MG/2ML IJ SOLN
INTRAMUSCULAR | Status: AC
  Filled 2018-03-07: qty 2

## 2018-03-07 MED ORDER — ACETAMINOPHEN 10 MG/ML IV SOLN
INTRAVENOUS | Status: DC | PRN
  Administered 2018-03-07: 1000 mg via INTRAVENOUS

## 2018-03-07 MED ORDER — ACETAMINOPHEN 160 MG/5ML PO SOLN
325.0000 mg | ORAL | Status: DC | PRN
  Filled 2018-03-07: qty 20.3

## 2018-03-07 MED ORDER — PROPOFOL 10 MG/ML IV BOLUS
INTRAVENOUS | Status: DC | PRN
  Administered 2018-03-07 (×2): 20 mg via INTRAVENOUS

## 2018-03-07 SURGICAL SUPPLY — 26 items
BINDER BREAST LRG (GAUZE/BANDAGES/DRESSINGS) ×2 IMPLANT
CANISTER SUCT 1200ML W/VALVE (MISCELLANEOUS) ×2 IMPLANT
CHLORAPREP W/TINT 26ML (MISCELLANEOUS) ×2 IMPLANT
DRAPE LAPAROTOMY 100X77 ABD (DRAPES) ×2 IMPLANT
DRSG TEGADERM 4X4.75 (GAUZE/BANDAGES/DRESSINGS) IMPLANT
DRSG TELFA 3X8 NADH (GAUZE/BANDAGES/DRESSINGS) ×2 IMPLANT
ELECT REM PT RETURN 9FT ADLT (ELECTROSURGICAL) ×2
ELECTRODE REM PT RTRN 9FT ADLT (ELECTROSURGICAL) ×1 IMPLANT
GAUZE FLUFF 18X24 1PLY STRL (GAUZE/BANDAGES/DRESSINGS) ×2 IMPLANT
GAUZE SPONGE 4X4 12PLY STRL (GAUZE/BANDAGES/DRESSINGS) IMPLANT
GLOVE BIO SURGEON STRL SZ7.5 (GLOVE) ×4 IMPLANT
GLOVE INDICATOR 8.0 STRL GRN (GLOVE) ×4 IMPLANT
GOWN STRL REUS W/ TWL LRG LVL3 (GOWN DISPOSABLE) ×2 IMPLANT
GOWN STRL REUS W/TWL LRG LVL3 (GOWN DISPOSABLE) ×2
KIT TURNOVER KIT A (KITS) ×2 IMPLANT
LABEL OR SOLS (LABEL) ×2 IMPLANT
NEEDLE HYPO 25X1 1.5 SAFETY (NEEDLE) ×2 IMPLANT
NS IRRIG 500ML POUR BTL (IV SOLUTION) ×2 IMPLANT
PACK BASIN MINOR ARMC (MISCELLANEOUS) ×2 IMPLANT
STRIP CLOSURE SKIN 1/2X4 (GAUZE/BANDAGES/DRESSINGS) ×2 IMPLANT
SUT VIC AB 3-0 SH 27 (SUTURE) ×1
SUT VIC AB 3-0 SH 27X BRD (SUTURE) ×1 IMPLANT
SUT VIC AB 4-0 FS2 27 (SUTURE) ×2 IMPLANT
SWABSTK COMLB BENZOIN TINCTURE (MISCELLANEOUS) ×2 IMPLANT
SYR CONTROL 10ML (SYRINGE) IMPLANT
SYRINGE IRR TOOMEY STRL 70CC (SYRINGE) ×2 IMPLANT

## 2018-03-07 NOTE — Anesthesia Preprocedure Evaluation (Addendum)
Anesthesia Evaluation  Patient identified by MRN, date of birth, ID band Patient awake    Reviewed: Allergy & Precautions, H&P , NPO status , reviewed documented beta blocker date and time   Airway Mallampati: II  TM Distance: >3 FB Neck ROM: full    Dental  (+) Teeth Intact, Dental Advidsory Given Wide gap upper incisors:   Pulmonary shortness of breath, asthma ,    Pulmonary exam normal        Cardiovascular hypertension, Normal cardiovascular exam  MRI showed normal LV, no evidence of sarcoid   Neuro/Psych  Headaches, PSYCHIATRIC DISORDERS Anxiety Depression  Neuromuscular disease    GI/Hepatic GERD  ,  Endo/Other    Renal/GU      Musculoskeletal  (+) Arthritis ,   Abdominal   Peds  Hematology  (+) anemia ,   Anesthesia Other Findings Past Medical History: No date: Asthma 2013: Breast fibrocystic disorder 2011: Hernia     Comment:  chest wall 2011: Lung mass No date: Sarcoidosis No date: Shortness of breath     Comment:  2011  Past Surgical History: 2009: ABDOMINAL HYSTERECTOMY 02/15/2018: BREAST BIOPSY; Left     Comment:  PREDOMINANTLY MATURE ADIPOSE TISSUE WITH VERY FOCAL               AREAS OF BENIGN  2011: chamberlain procedure  2011: LUNG BIOPSY 20113: RESECTION RIBS EXTRAPLEURAL  BMI    Body Mass Index:  25.99 kg/m      Reproductive/Obstetrics                            Anesthesia Physical Anesthesia Plan  ASA: III  Anesthesia Plan: General   Post-op Pain Management:    Induction:   PONV Risk Score and Plan: 3 and Treatment may vary due to age or medical condition, TIVA, Ondansetron and Midazolam  Airway Management Planned:   Additional Equipment:   Intra-op Plan:   Post-operative Plan:   Informed Consent: I have reviewed the patients History and Physical, chart, labs and discussed the procedure including the risks, benefits and alternatives for the  proposed anesthesia with the patient or authorized representative who has indicated his/her understanding and acceptance.   Dental Advisory Given  Plan Discussed with: CRNA  Anesthesia Plan Comments:        Anesthesia Quick Evaluation

## 2018-03-07 NOTE — Progress Notes (Signed)
Nauseated  Phenergan 6.25 given

## 2018-03-07 NOTE — Anesthesia Post-op Follow-up Note (Signed)
Anesthesia QCDR form completed.        

## 2018-03-07 NOTE — Op Note (Signed)
Preoperative diagnosis: Hematoma of the left breast post biopsy.  Postoperative diagnosis: Same.  Operative procedure ultrasound-guided evacuation of left breast hematoma.  Operating Surgeon: Donnalee Curry, MD.  Anesthesia: Monitored anesthesia with propofol, 1% plain Xylocaine, 4 cc.  Estimated blood loss: None.  Clinical note: This 52 year old woman underwent a core biopsy approximately 2 weeks ago and near immediately developed a hematoma.  This is been persistent in spite of aspiration attempts x2.  She is brought to the operative for operative evacuation.  Patient received Kefzol prior to the procedure.  Operative note: With the patient under adequate sedation the left breast was cleansed with ChloraPrep and draped.  Local anesthetic was infiltrated.  Ultrasound was used to choose a point closest to the hematoma cavity.  A 1.2 cm incision was made, large enough to allow insertion of a Yankauer suction tip.  This was then inserted under ultrasound guidance to evacuate all the various pockets of the hematoma.  The cavity was then irrigated with warm saline solution.  Clear return was obtained indicating no ongoing bleeding.  The skin defect was closed with a single 4-0 Vicryl subarticular suture followed by benzoin and Steri-Strips, Telfa and a compressive wrap with fluff gauze.  The patient tolerated the procedure well and was taken to recovery room in stable condition.

## 2018-03-07 NOTE — Anesthesia Procedure Notes (Signed)
Performed by: Caton Popowski, CRNA Pre-anesthesia Checklist: Patient identified, Emergency Drugs available, Suction available, Patient being monitored and Timeout performed Patient Re-evaluated:Patient Re-evaluated prior to induction Oxygen Delivery Method: Simple face mask Induction Type: IV induction       

## 2018-03-07 NOTE — H&P (Signed)
No change in clinical history or exam. For evacuation of left breast hematoma.

## 2018-03-07 NOTE — Progress Notes (Signed)
Blood pressure high because pt is sick and vomiting   Vomited approx 200cc of clear liquid

## 2018-03-07 NOTE — Progress Notes (Signed)
Nausea better.

## 2018-03-07 NOTE — Progress Notes (Signed)
Continue nausea gave an another 6.25 phenergan

## 2018-03-07 NOTE — Transfer of Care (Signed)
Immediate Anesthesia Transfer of Care Note  Patient: Brittany Huynh  Procedure(s) Performed: IRRIGATION AND DEBRIDEMENT HEMATOMA-LEFT BREAST (Left )  Patient Location: PACU  Anesthesia Type:General  Level of Consciousness: awake, alert  and oriented  Airway & Oxygen Therapy: Patient Spontanous Breathing and Patient connected to face mask oxygen  Post-op Assessment: Report given to RN and Post -op Vital signs reviewed and stable  Post vital signs: Reviewed and stable  Last Vitals:  Vitals Value Taken Time  BP 140/94 03/07/2018  1:29 PM  Temp    Pulse    Resp 12 03/07/2018  1:29 PM  SpO2    Vitals shown include unvalidated device data.  Last Pain:  Vitals:   03/07/18 1154  TempSrc: Temporal  PainSc: 0-No pain         Complications: No apparent anesthesia complications

## 2018-03-08 ENCOUNTER — Encounter: Payer: Self-pay | Admitting: General Surgery

## 2018-03-08 NOTE — Anesthesia Postprocedure Evaluation (Signed)
Anesthesia Post Note  Patient: Brittany Huynh  Procedure(s) Performed: IRRIGATION AND DEBRIDEMENT HEMATOMA-LEFT BREAST (Left )  Anesthesia Type: General     Last Vitals:  Vitals:   03/07/18 1531 03/07/18 1549  BP: (!) 147/92 (!) 157/88  Pulse:  74  Resp:  14  Temp:    SpO2:  100%    Last Pain:  Vitals:   03/07/18 1529  TempSrc:   PainSc: 0-No pain                 Christia Reading

## 2018-03-08 NOTE — Anesthesia Postprocedure Evaluation (Signed)
Anesthesia Post Note  Patient: Brittany Huynh  Procedure(s) Performed: IRRIGATION AND DEBRIDEMENT HEMATOMA-LEFT BREAST (Left )  Patient location during evaluation: PACU Anesthesia Type: General Level of consciousness: awake and alert Pain management: pain level controlled Vital Signs Assessment: post-procedure vital signs reviewed and stable Respiratory status: spontaneous breathing, nonlabored ventilation and respiratory function stable Cardiovascular status: blood pressure returned to baseline and stable Postop Assessment: no apparent nausea or vomiting Anesthetic complications: no     Last Vitals:  Vitals:   03/07/18 1531 03/07/18 1549  BP: (!) 147/92 (!) 157/88  Pulse:  74  Resp:  14  Temp:    SpO2:  100%    Last Pain:  Vitals:   03/07/18 1529  TempSrc:   PainSc: 0-No pain                 Christia Reading

## 2018-03-13 ENCOUNTER — Encounter: Payer: Self-pay | Admitting: Nurse Practitioner

## 2018-03-13 ENCOUNTER — Ambulatory Visit: Payer: Managed Care, Other (non HMO) | Admitting: Nurse Practitioner

## 2018-03-13 ENCOUNTER — Ambulatory Visit: Payer: Managed Care, Other (non HMO) | Admitting: General Surgery

## 2018-03-13 ENCOUNTER — Encounter: Payer: Self-pay | Admitting: General Surgery

## 2018-03-13 VITALS — BP 122/84 | HR 88 | Resp 16 | Ht 66.0 in | Wt 159.6 lb

## 2018-03-13 VITALS — BP 126/78 | HR 78 | Temp 98.1°F | Resp 14 | Ht 67.0 in | Wt 162.0 lb

## 2018-03-13 DIAGNOSIS — N959 Unspecified menopausal and perimenopausal disorder: Secondary | ICD-10-CM | POA: Diagnosis not present

## 2018-03-13 DIAGNOSIS — N644 Mastodynia: Secondary | ICD-10-CM

## 2018-03-13 DIAGNOSIS — S2002XD Contusion of left breast, subsequent encounter: Secondary | ICD-10-CM

## 2018-03-13 DIAGNOSIS — D860 Sarcoidosis of lung: Secondary | ICD-10-CM | POA: Diagnosis not present

## 2018-03-13 DIAGNOSIS — G43109 Migraine with aura, not intractable, without status migrainosus: Secondary | ICD-10-CM | POA: Diagnosis not present

## 2018-03-13 DIAGNOSIS — L2089 Other atopic dermatitis: Secondary | ICD-10-CM

## 2018-03-13 MED ORDER — FLUOXETINE HCL 10 MG PO TABS: 10.0000 mg | ORAL_TABLET | Freq: Every day | ORAL | 3 refills | Status: DC

## 2018-03-13 MED ORDER — ZOLMITRIPTAN 5 MG NA SOLN: 1.0000 | NASAL | 0 refills | Status: DC | PRN

## 2018-03-13 MED ORDER — PREDNISONE 10 MG PO TABS: 10.0000 mg | ORAL_TABLET | Freq: Every day | ORAL | 3 refills | Status: DC

## 2018-03-13 MED ORDER — CLOTRIMAZOLE-BETAMETHASONE 1-0.05 % EX CREA: 1.0000 "application " | TOPICAL_CREAM | Freq: Two times a day (BID) | CUTANEOUS | 2 refills | Status: DC

## 2018-03-13 MED ORDER — PREDNISONE 1 MG PO TABS: ORAL_TABLET | ORAL | 3 refills | Status: DC

## 2018-03-13 NOTE — Progress Notes (Signed)
Patient ID: Brittany Huynh, female   DOB: [birth year], 52 y.o.   MRN: [id number]  Chief Complaint  Patient presents with  . Routine Post Op    HPI Brittany Huynh is a 52 y.o. female here today for her follow up I &D left breast hematoma done on 03/06/2018. Patient states the area is sore.  She has had some chills and sweats durning the day. Rash between her breast. Patient states she has been getting up in the middle of the night.  She feels flushed from the waist up.  Drenching sweats.  Worse since her recent anesthetic.  She can't sleep in her bed. HPI  Past Medical History:  Diagnosis Date  . Asthma   . Breast fibrocystic disorder 2013  . Hernia 2011   chest wall  . Lung mass 2011  . Sarcoidosis   . Shortness of breath    2011    Past Surgical History:  Procedure Laterality Date  . ABDOMINAL HYSTERECTOMY  2009  . BREAST BIOPSY Left 02/15/2018   PREDOMINANTLY MATURE ADIPOSE TISSUE WITH VERY FOCAL AREAS OF BENIGN   . chamberlain procedure   2011  . IRRIGATION AND DEBRIDEMENT HEMATOMA Left 03/07/2018   Procedure: IRRIGATION AND DEBRIDEMENT HEMATOMA-LEFT BREAST;  Surgeon: Earline Mayotte, MD;  Location: [facility] ORS;  Service: General;  Laterality: Left;  . LUNG BIOPSY  2011  . RESECTION RIBS EXTRAPLEURAL  [id number]    Family History  Problem Relation Age of Onset  . Liver cancer Mother 86  . Bone cancer Maternal Aunt 60  . Breast cancer Maternal Aunt   . Breast cancer Paternal Aunt   . Bone cancer Paternal Aunt 23    Social History Social History   Tobacco Use  . Smoking status: Never Smoker  . Smokeless tobacco: Never Used  Substance Use Topics  . Alcohol use: Yes    Comment: on occassion  . Drug use: No    Allergies  Allergen Reactions  . Onion Anaphylaxis  . Adhesive [Tape] Rash    Current Outpatient Medications  Medication Sig Dispense Refill  . albuterol (PROVENTIL HFA;VENTOLIN HFA) 108 (90 BASE) MCG/ACT inhaler Inhale 2 puffs into the lungs every 6 (six)  hours as needed for wheezing or shortness of breath.     Marland Kitchen aspirin-acetaminophen-caffeine (EXCEDRIN MIGRAINE) 250-250-65 MG per tablet Take 1 tablet by mouth every 6 (six) hours as needed for headache.     . Homeopathic Products (ARNICARE BRUISE EX) Apply 1 application topically at bedtime.    . mirtazapine (REMERON) 30 MG tablet Take 1 tablet (30 mg total) by mouth at bedtime. 30 tablet 3  . pramipexole (MIRAPEX) 1 MG tablet Take 1 tablet (1 mg total) by mouth at bedtime. 30 tablet 3  . traMADol (ULTRAM) 50 MG tablet Take 1 tablet (50 mg total) by mouth every 6 (six) hours as needed. (Patient taking differently: Take 50 mg by mouth every 6 (six) hours as needed for moderate pain. ) 30 tablet 1  . clotrimazole-betamethasone (LOTRISONE) cream Apply 1 application topically 2 (two) times daily. 45 g 2  . FLUoxetine (PROZAC) 10 MG tablet Take 1 tablet (10 mg total) by mouth daily. 30 tablet 3  . predniSONE (DELTASONE) 1 MG tablet Take 2  Tablets daily 60 tablet 3  . predniSONE (DELTASONE) 10 MG tablet Take 1 tablet (10 mg total) by mouth daily. 30 tablet 3  . zolmitriptan (ZOMIG) 5 MG nasal solution Place 1 spray into the nose as needed for migraine.  6 Units 0   No current facility-administered medications for this visit.     Review of Systems Review of Systems  Constitutional: Negative.   Respiratory: Negative.   Cardiovascular: Negative.     Blood pressure 126/78, pulse 78, temperature 98.1 F (36.7 C), temperature source Oral, resp. rate 14, height 5\' 7"  (1.702 m), weight 162 lb (73.5 kg).  Physical Exam Physical Exam  Constitutional: She is oriented to person, place, and time. She appears well-developed and well-nourished.  Pulmonary/Chest:    Neurological: She is alert and oriented to person, place, and time.  Skin: Skin is warm.      Assessment    Doing well status post evacuation of post ultrasound-guided biopsy hematoma.  Of the left breast.    Plan  No clear source for  her reported fevers.  Clear cardiopulmonary exam.  No urinary symptoms.  Clean wound post hematoma drainage.  Patient to check her tempeture at night. The patient is aware to use a heating pad as needed for comfort. Return in three weeks. The patient is aware to call back for any questions or concerns.  Local heat to help resolve residual thickening post hematoma drainage.  Radiology had recommended a 29-month follow-up, considering the extent of her hematoma I think this will need to be postponed until at least a 48-month mark.  Early follow-up with her PCP regarding her "B" symptoms of fever and sweats has been recommended.  She is normotensive, making an occult pheochromocytoma less likely.  HPI, Physical Exam, Assessment and Plan have been scribed under the direction and in the presence of 8-month, MD.  Donnalee Curry, CMA  I have completed the exam and reviewed the above documentation for accuracy and completeness.  I agree with the above.  Ples Specter has been used and any errors in dictation or transcription are unintentional.  Museum/gallery conservator, M.D., F.A.C.S. Donnalee Curry Loyd Salvador 03/13/2018, 8:26 PM

## 2018-03-13 NOTE — Progress Notes (Signed)
[facility] [address] [place], [place] [address]  Internal MEDICINE  Office Visit Note  Patient Name: Brittany Huynh  [id number]  [id number]  Date of Service: 04/07/2018  Chief Complaint  Patient presents with  . Mass    large mass/hematoma of left breast.     Patient had biopsy done on left breast on 02/16/2018. During the biopsy she developed a large and tender hematoma. Initially, she was treated with tylenol with codeine to help with pain and advised to ice the affected area. After a few days, she had to return to the breast center and this large hematoma had to be drained. There was still a fairly large remaining mass after this was drained. She had significant brusing which continued to worsen. When she continued to have pain and swelling after that, she was told to see her PCP. Referred to surgery and since her visit, she had large hematoma removed. The swelling in her breast and breaking out has improved greatly since her last visit.       Current Medication: Outpatient Encounter Medications as of 03/13/2018  Medication Sig  . albuterol (PROVENTIL HFA;VENTOLIN HFA) 108 (90 BASE) MCG/ACT inhaler Inhale 2 puffs into the lungs every 6 (six) hours as needed for wheezing or shortness of breath.   Marland Kitchen aspirin-acetaminophen-caffeine (EXCEDRIN MIGRAINE) 250-250-65 MG per tablet Take 1 tablet by mouth every 6 (six) hours as needed for headache.   . Homeopathic Products (ARNICARE BRUISE EX) Apply 1 application topically at bedtime.  . mirtazapine (REMERON) 30 MG tablet Take 1 tablet (30 mg total) by mouth at bedtime.  . pramipexole (MIRAPEX) 1 MG tablet Take 1 tablet (1 mg total) by mouth at bedtime.  . predniSONE (DELTASONE) 1 MG tablet Take 2  Tablets daily  . predniSONE (DELTASONE) 10 MG tablet Take 1 tablet (10 mg total) by mouth daily.  . [DISCONTINUED] HYDROcodone-acetaminophen (NORCO/VICODIN) 5-325 MG tablet Take 1 tablet by mouth every 4 (four) hours as needed for  moderate pain.  . [DISCONTINUED] predniSONE (DELTASONE) 1 MG tablet Take 2  Tablets daily (Patient taking differently: Take 2 mg by mouth daily. )  . [DISCONTINUED] predniSONE (DELTASONE) 10 MG tablet Take 1 tablet (10 mg total) by mouth daily.  . [DISCONTINUED] traMADol (ULTRAM) 50 MG tablet Take 1 tablet (50 mg total) by mouth every 6 (six) hours as needed. (Patient taking differently: Take 50 mg by mouth every 6 (six) hours as needed for moderate pain. )  . clotrimazole-betamethasone (LOTRISONE) cream Apply 1 application topically 2 (two) times daily.  Marland Kitchen FLUoxetine (PROZAC) 10 MG tablet Take 1 tablet (10 mg total) by mouth daily.  Marland Kitchen zolmitriptan (ZOMIG) 5 MG nasal solution Place 1 spray into the nose as needed for migraine.   No facility-administered encounter medications on file as of 03/13/2018.     Surgical History: Past Surgical History:  Procedure Laterality Date  . ABDOMINAL HYSTERECTOMY  2009  . BREAST BIOPSY Left 02/15/2018   PREDOMINANTLY MATURE ADIPOSE TISSUE WITH VERY FOCAL AREAS OF BENIGN   . chamberlain procedure   2011  . IRRIGATION AND DEBRIDEMENT HEMATOMA Left 03/07/2018   Procedure: IRRIGATION AND DEBRIDEMENT HEMATOMA-LEFT BREAST;  Surgeon: Earline Mayotte, MD;  Location: [facility] ORS;  Service: General;  Laterality: Left;  . LUNG BIOPSY  2011  . RESECTION RIBS EXTRAPLEURAL  [id number]    Medical History: Past Medical History:  Diagnosis Date  . Asthma   . Breast fibrocystic disorder 2013  . Hernia 2011   chest  wall  . Lung mass 2011  . Sarcoidosis   . Shortness of breath    2011    Family History: Family History  Problem Relation Age of Onset  . Liver cancer Mother 61  . Bone cancer Maternal Aunt 60  . Breast cancer Maternal Aunt   . Breast cancer Paternal Aunt   . Bone cancer Paternal Aunt 70    Social History   Socioeconomic History  . Marital status: Single    Spouse name: Not on file  . Number of children: Not on file  . Years of education: Not on  file  . Highest education level: Not on file  Occupational History  . Not on file  Social Needs  . Financial resource strain: Not on file  . Food insecurity:    Worry: Not on file    Inability: Not on file  . Transportation needs:    Medical: Not on file    Non-medical: Not on file  Tobacco Use  . Smoking status: Never Smoker  . Smokeless tobacco: Never Used  Substance and Sexual Activity  . Alcohol use: Yes    Comment: on occassion  . Drug use: No  . Sexual activity: Not on file  Lifestyle  . Physical activity:    Days per week: Not on file    Minutes per session: Not on file  . Stress: Not on file  Relationships  . Social connections:    Talks on phone: Not on file    Gets together: Not on file    Attends religious service: Not on file    Active member of club or organization: Not on file    Attends meetings of clubs or organizations: Not on file    Relationship status: Not on file  . Intimate partner violence:    Fear of current or ex partner: Not on file    Emotionally abused: Not on file    Physically abused: Not on file    Forced sexual activity: Not on file  Other Topics Concern  . Not on file  Social History Narrative  . Not on file      Review of Systems  Constitutional: Positive for activity change and fatigue. Negative for chills and unexpected weight change.  HENT: Negative for congestion, postnasal drip, rhinorrhea, sneezing and sore throat.   Eyes: Negative.  Negative for redness.  Respiratory: Positive for chest tightness and shortness of breath. Negative for cough and wheezing.   Cardiovascular: Negative for chest pain, palpitations and leg swelling.  Gastrointestinal: Negative for abdominal pain, constipation, diarrhea, nausea and vomiting.  Endocrine: Negative for cold intolerance, heat intolerance, polydipsia, polyphagia and polyuria.  Genitourinary: Negative.  Negative for difficulty urinating, dyspareunia, dysuria, flank pain and frequency.    Musculoskeletal: Positive for arthralgias and myalgias. Negative for back pain, joint swelling and neck pain.  Skin: Negative for rash.       Significant bruising with hematoma outer aspect of left breast and under the left arm. Surgical removal of hematoma since her last visit and swelling has improved, however, bruising still present.   Allergic/Immunologic: Positive for environmental allergies.  Neurological: Positive for dizziness, weakness and headaches. Negative for tremors and numbness.  Hematological: Negative for adenopathy. Does not bruise/bleed easily.  Psychiatric/Behavioral: Positive for dysphoric mood and sleep disturbance. Negative for behavioral problems (Depression) and suicidal ideas. The patient is nervous/anxious.     Vital Signs: BP 122/84 (BP Location: Left Arm, Patient Position: Sitting, Cuff Size: Normal)  Pulse 88   Resp 16   Ht 5\' 6"  (1.676 m)   Wt 159 lb 9.6 oz (72.4 kg)   SpO2 99%   BMI 25.76 kg/m    Physical Exam  Constitutional: She is oriented to person, place, and time. She appears well-developed and well-nourished. No distress.  HENT:  Head: Normocephalic and atraumatic.  Nose: Nose normal.  Mouth/Throat: Oropharynx is clear and moist. No oropharyngeal exudate.  Eyes: Pupils are equal, round, and reactive to light. Conjunctivae and EOM are normal.  Neck: Normal range of motion. Neck supple. No JVD present. No tracheal deviation present. No thyromegaly present.  Cardiovascular: Normal rate, regular rhythm and normal heart sounds. Exam reveals no gallop and no friction rub.  No murmur heard. Pulmonary/Chest: Effort normal and breath sounds normal. No respiratory distress. She has no wheezes. She has no rales. She exhibits no tenderness.    Abdominal: Soft. Bowel sounds are normal.  Musculoskeletal: Normal range of motion.  Lymphadenopathy:    She has no cervical adenopathy.  Neurological: She is alert and oriented to person, place, and time. No  cranial nerve deficit.  Skin: Skin is warm and dry. She is not diaphoretic.  Psychiatric: Her speech is normal and behavior is normal. Judgment and thought content normal. Her mood appears anxious. Cognition and memory are normal. She exhibits a depressed mood.  Nursing note and vitals reviewed.  Assessment/Plan: 1. Breast pain, left Improving. Will monitor closely  2. Unspecified menopausal and perimenopausal disorder Start prozac 10mg  daily.  - FLUoxetine (PROZAC) 10 MG tablet; Take 1 tablet (10 mg total) by mouth daily.  Dispense: 30 tablet; Refill: 3  3. Migraine with aura and without status migrainosus, not intractable Add zomig 5mg  nasal spray. Use as needed to relieve acute migraine headache. A sample was provided today.  - zolmitriptan (ZOMIG) 5 MG nasal solution; Place 1 spray into the nose as needed for migraine.  Dispense: 6 Units; Refill: 0  4. Sarcoidosis of lung (HCC) - predniSONE (DELTASONE) 1 MG tablet; Take 2  Tablets daily  Dispense: 60 tablet; Refill: 3 - predniSONE (DELTASONE) 10 MG tablet; Take 1 tablet (10 mg total) by mouth daily.  Dispense: 30 tablet; Refill: 3  5. Other atopic dermatitis - clotrimazole-betamethasone (LOTRISONE) cream; Apply 1 application topically 2 (two) times daily.  Dispense: 45 g; Refill: 2  General Counseling: Ajaylah verbalizes understanding of the findings of todays visit and agrees with plan of treatment. I have discussed any further diagnostic evaluation that may be needed or ordered today. We also reviewed her medications today. she has been encouraged to call the office with any questions or concerns that should arise related to todays visit.  This patient was seen by Vincent Gros FNP Collaboration with Dr Lyndon Code as a part of collaborative care agreement   Meds ordered this encounter  Medications  . FLUoxetine (PROZAC) 10 MG tablet    Sig: Take 1 tablet (10 mg total) by mouth daily.    Dispense:  30 tablet    Refill:  3     Order Specific Question:   Supervising Provider    Answer:   Lyndon Code [[id number]]  . zolmitriptan (ZOMIG) 5 MG nasal solution    Sig: Place 1 spray into the nose as needed for migraine.    Dispense:  6 Units    Refill:  0    Sample provided today.    Order Specific Question:   Supervising Provider    Answer:  KHAN, FOZIA M [[id number]]  . predniSONE (DELTASONE) 1 MG tablet    Sig: Take 2  Tablets daily    Dispense:  60 tablet    Refill:  3    Order Specific Question:   Supervising Provider    Answer:   Lyndon Code [[id number]]  . predniSONE (DELTASONE) 10 MG tablet    Sig: Take 1 tablet (10 mg total) by mouth daily.    Dispense:  30 tablet    Refill:  3    Order Specific Question:   Supervising Provider    Answer:   Lyndon Code [[id number]]  . clotrimazole-betamethasone (LOTRISONE) cream    Sig: Apply 1 application topically 2 (two) times daily.    Dispense:  45 g    Refill:  2    Order Specific Question:   Supervising Provider    Answer:   Lyndon Code [[id number]]    Time spent: 70 Minutes      Dr Lyndon Code Internal medicine

## 2018-03-13 NOTE — Patient Instructions (Signed)
Patient to check her tempeture at night. The patient is aware to use a heating pad as needed for comfort. Return in three weeks. The patient is aware to call back for any questions or concerns.

## 2018-04-03 ENCOUNTER — Encounter: Payer: Self-pay | Admitting: General Surgery

## 2018-04-03 ENCOUNTER — Ambulatory Visit (INDEPENDENT_AMBULATORY_CARE_PROVIDER_SITE_OTHER): Payer: Managed Care, Other (non HMO) | Admitting: General Surgery

## 2018-04-03 VITALS — BP 122/68 | HR 84 | Resp 12 | Ht 67.0 in | Wt 163.0 lb

## 2018-04-03 DIAGNOSIS — S2002XD Contusion of left breast, subsequent encounter: Secondary | ICD-10-CM

## 2018-04-03 NOTE — Patient Instructions (Signed)
The patient is aware to call back for any questions or concerns. The patient is aware to use a heating pad as needed for comfort.  

## 2018-04-03 NOTE — Progress Notes (Signed)
Patient ID: Brittany Huynh, female   DOB: Mar 21, 1966, 52 y.o.   MRN: [id number]  Chief Complaint  Patient presents with  . Follow-up    HPI Brittany Huynh is a 52 y.o. female here today for her follow up left breast I &D hematoma. Patient sattes the area is doing well.   HPI  Past Medical History:  Diagnosis Date  . Asthma   . Breast fibrocystic disorder 2013  . Hernia 2011   chest wall  . Lung mass 2011  . Sarcoidosis   . Shortness of breath    2011    Past Surgical History:  Procedure Laterality Date  . ABDOMINAL HYSTERECTOMY  2009  . BREAST BIOPSY Left 02/15/2018   PREDOMINANTLY MATURE ADIPOSE TISSUE WITH VERY FOCAL AREAS OF BENIGN   . chamberlain procedure   2011  . IRRIGATION AND DEBRIDEMENT HEMATOMA Left 03/07/2018   Procedure: IRRIGATION AND DEBRIDEMENT HEMATOMA-LEFT BREAST;  Surgeon: Earline Mayotte, MD;  Location: [facility] ORS;  Service: General;  Laterality: Left;  . LUNG BIOPSY  2011  . RESECTION RIBS EXTRAPLEURAL  [id number]    Family History  Problem Relation Age of Onset  . Liver cancer Mother 46  . Bone cancer Maternal Aunt 60  . Breast cancer Maternal Aunt   . Breast cancer Paternal Aunt   . Bone cancer Paternal Aunt 66    Social History Social History   Tobacco Use  . Smoking status: Never Smoker  . Smokeless tobacco: Never Used  Substance Use Topics  . Alcohol use: Yes    Comment: on occassion  . Drug use: No    Allergies  Allergen Reactions  . Onion Anaphylaxis  . Adhesive [Tape] Rash    Current Outpatient Medications  Medication Sig Dispense Refill  . albuterol (PROVENTIL HFA;VENTOLIN HFA) 108 (90 BASE) MCG/ACT inhaler Inhale 2 puffs into the lungs every 6 (six) hours as needed for wheezing or shortness of breath.     Marland Kitchen aspirin-acetaminophen-caffeine (EXCEDRIN MIGRAINE) 250-250-65 MG per tablet Take 1 tablet by mouth every 6 (six) hours as needed for headache.     . clotrimazole-betamethasone (LOTRISONE) cream Apply 1 application  topically 2 (two) times daily. 45 g 2  . FLUoxetine (PROZAC) 10 MG tablet Take 1 tablet (10 mg total) by mouth daily. 30 tablet 3  . Homeopathic Products (ARNICARE BRUISE EX) Apply 1 application topically at bedtime.    . mirtazapine (REMERON) 30 MG tablet Take 1 tablet (30 mg total) by mouth at bedtime. 30 tablet 3  . pramipexole (MIRAPEX) 1 MG tablet Take 1 tablet (1 mg total) by mouth at bedtime. 30 tablet 3  . predniSONE (DELTASONE) 1 MG tablet Take 2  Tablets daily 60 tablet 3  . predniSONE (DELTASONE) 10 MG tablet Take 1 tablet (10 mg total) by mouth daily. 30 tablet 3  . zolmitriptan (ZOMIG) 5 MG nasal solution Place 1 spray into the nose as needed for migraine. 6 Units 0   No current facility-administered medications for this visit.     Review of Systems Review of Systems  Constitutional: Negative.   Respiratory: Negative.   Cardiovascular: Negative.     Blood pressure 122/68, pulse 84, resp. rate 12, height 5\' 7"  (1.702 m), weight 163 lb (73.9 kg).  Physical Exam Physical Exam  Constitutional: She is oriented to person, place, and time. She appears well-developed and well-nourished.  Pulmonary/Chest:  Area much softer    Neurological: She is alert and oriented to person, place, and time.  Skin: Skin is warm and dry.  Psychiatric: Her behavior is normal.       Assessment    Doing well status post operative drainage of post biopsy hematoma.    Plan  The patient is aware to use a heating pad as needed for comfort. Follow up as needed. The patient is aware to call back for any questions or new concerns. Resume screening mammograms next year with her PCP.   HPI, Physical Exam, Assessment and Plan have been scribed under the direction and in the presence of Earline Mayotte, MD. Dorathy Daft, RN  I have completed the exam and reviewed the above documentation for accuracy and completeness.  I agree with the above.  Museum/gallery conservator has been used and any errors  in dictation or transcription are unintentional.  Donnalee Curry, M.D., F.A.C.S.  Merrily Pew [place]ith 04/03/2018, 9:57 PM

## 2018-04-07 DIAGNOSIS — L2089 Other atopic dermatitis: Secondary | ICD-10-CM | POA: Insufficient documentation

## 2018-04-19 ENCOUNTER — Ambulatory Visit: Payer: Self-pay | Admitting: Nurse Practitioner

## 2018-04-24 ENCOUNTER — Other Ambulatory Visit: Payer: Self-pay | Admitting: Internal Medicine

## 2018-04-24 DIAGNOSIS — F339 Major depressive disorder, recurrent, unspecified: Secondary | ICD-10-CM

## 2018-04-26 ENCOUNTER — Ambulatory Visit: Payer: Managed Care, Other (non HMO) | Admitting: Adult Health

## 2018-04-26 ENCOUNTER — Encounter: Payer: Self-pay | Admitting: Adult Health

## 2018-04-26 VITALS — BP 140/80 | HR 95 | Resp 16 | Ht 67.0 in | Wt 166.0 lb

## 2018-04-26 DIAGNOSIS — J069 Acute upper respiratory infection, unspecified: Secondary | ICD-10-CM | POA: Diagnosis not present

## 2018-04-26 DIAGNOSIS — D860 Sarcoidosis of lung: Secondary | ICD-10-CM | POA: Diagnosis not present

## 2018-04-26 DIAGNOSIS — N959 Unspecified menopausal and perimenopausal disorder: Secondary | ICD-10-CM | POA: Diagnosis not present

## 2018-04-26 DIAGNOSIS — F339 Major depressive disorder, recurrent, unspecified: Secondary | ICD-10-CM | POA: Diagnosis not present

## 2018-04-26 MED ORDER — FLUOXETINE HCL 10 MG PO TABS: 10.0000 mg | ORAL_TABLET | Freq: Every day | ORAL | 3 refills | Status: DC

## 2018-04-26 MED ORDER — AZITHROMYCIN 250 MG PO TABS: ORAL_TABLET | ORAL | 0 refills | Status: DC

## 2018-04-26 MED ORDER — PREDNISONE 1 MG PO TABS: ORAL_TABLET | ORAL | 3 refills | Status: DC

## 2018-04-26 MED ORDER — PREDNISONE 10 MG PO TABS: 10.0000 mg | ORAL_TABLET | Freq: Every day | ORAL | 3 refills | Status: DC

## 2018-04-26 MED ORDER — MIRTAZAPINE 30 MG PO TABS: 30.0000 mg | ORAL_TABLET | Freq: Every day | ORAL | 0 refills | Status: DC

## 2018-04-26 NOTE — Progress Notes (Signed)
[facility] [address] [place], [place] [address]  Internal MEDICINE  Office Visit Note  Patient Name: Brittany Huynh  [id number]  [id number]  Date of Service: 05/13/2018  Chief Complaint  Patient presents with  . Sarcoidosis  . Anxiety    med refills    HPI Pt here follow up on anxiety, sarcoidosis and depression.  She is generally in good health, and denies complaints at this time.  She reports coughing up a small amount green mucous over the last week.  She is concerned she may be developing an infection. She denies fever, or increased SOB.      Current Medication: Outpatient Encounter Medications as of 04/26/2018  Medication Sig  . albuterol (PROVENTIL HFA;VENTOLIN HFA) 108 (90 BASE) MCG/ACT inhaler Inhale 2 puffs into the lungs every 6 (six) hours as needed for wheezing or shortness of breath.   Marland Kitchen aspirin-acetaminophen-caffeine (EXCEDRIN MIGRAINE) 250-250-65 MG per tablet Take 1 tablet by mouth every 6 (six) hours as needed for headache.   . clotrimazole-betamethasone (LOTRISONE) cream Apply 1 application topically 2 (two) times daily.  Marland Kitchen FLUoxetine (PROZAC) 10 MG tablet Take 1 tablet (10 mg total) by mouth daily.  . Homeopathic Products (ARNICARE BRUISE EX) Apply 1 application topically at bedtime.  . mirtazapine (REMERON) 30 MG tablet Take 1 tablet (30 mg total) by mouth at bedtime.  . pramipexole (MIRAPEX) 1 MG tablet Take 1 tablet (1 mg total) by mouth at bedtime.  . predniSONE (DELTASONE) 1 MG tablet Take 2  Tablets daily  . predniSONE (DELTASONE) 10 MG tablet Take 1 tablet (10 mg total) by mouth daily.  Marland Kitchen zolmitriptan (ZOMIG) 5 MG nasal solution Place 1 spray into the nose as needed for migraine.  . [DISCONTINUED] FLUoxetine (PROZAC) 10 MG tablet Take 1 tablet (10 mg total) by mouth daily.  . [DISCONTINUED] mirtazapine (REMERON) 30 MG tablet TAKE 1 TABLET BY MOUTH AT BEDTIME  . [DISCONTINUED] predniSONE (DELTASONE) 1 MG tablet Take 2  Tablets daily  .  [DISCONTINUED] predniSONE (DELTASONE) 10 MG tablet Take 1 tablet (10 mg total) by mouth daily.  Marland Kitchen azithromycin (ZITHROMAX) 250 MG tablet Take 2 pills on day one. Take 1 pill on days 2-5.   No facility-administered encounter medications on file as of 04/26/2018.     Surgical History: Past Surgical History:  Procedure Laterality Date  . ABDOMINAL HYSTERECTOMY  2009  . BREAST BIOPSY Left 02/15/2018   PREDOMINANTLY MATURE ADIPOSE TISSUE WITH VERY FOCAL AREAS OF BENIGN   . chamberlain procedure   2011  . IRRIGATION AND DEBRIDEMENT HEMATOMA Left 03/07/2018   Procedure: IRRIGATION AND DEBRIDEMENT HEMATOMA-LEFT BREAST;  Surgeon: Earline Mayotte, MD;  Location: [facility] ORS;  Service: General;  Laterality: Left;  . LUNG BIOPSY  2011  . RESECTION RIBS EXTRAPLEURAL  [id number]    Medical History: Past Medical History:  Diagnosis Date  . Anxiety   . Asthma   . Breast fibrocystic disorder 2013  . Hernia 2011   chest wall  . Lung mass 2011  . Sarcoidosis   . Shortness of breath    2011    Family History: Family History  Problem Relation Age of Onset  . Liver cancer Mother 3  . Bone cancer Maternal Aunt 60  . Breast cancer Maternal Aunt   . Breast cancer Paternal Aunt   . Bone cancer Paternal Aunt 51    Social History   Socioeconomic History  . Marital status: Single    Spouse name: Not on file  .  Number of children: Not on file  . Years of education: Not on file  . Highest education level: Not on file  Occupational History  . Not on file  Social Needs  . Financial resource strain: Not on file  . Food insecurity:    Worry: Not on file    Inability: Not on file  . Transportation needs:    Medical: Not on file    Non-medical: Not on file  Tobacco Use  . Smoking status: Never Smoker  . Smokeless tobacco: Never Used  Substance and Sexual Activity  . Alcohol use: Yes    Comment: on occassion  . Drug use: No  . Sexual activity: Not on file  Lifestyle  . Physical activity:     Days per week: Not on file    Minutes per session: Not on file  . Stress: Not on file  Relationships  . Social connections:    Talks on phone: Not on file    Gets together: Not on file    Attends religious service: Not on file    Active member of club or organization: Not on file    Attends meetings of clubs or organizations: Not on file    Relationship status: Not on file  . Intimate partner violence:    Fear of current or ex partner: Not on file    Emotionally abused: Not on file    Physically abused: Not on file    Forced sexual activity: Not on file  Other Topics Concern  . Not on file  Social History Narrative  . Not on file      Review of Systems  Constitutional: Negative for chills, fatigue and unexpected weight change.  HENT: Positive for congestion. Negative for rhinorrhea, sneezing and sore throat.   Eyes: Negative for photophobia, pain and redness.  Respiratory: Positive for cough and shortness of breath. Negative for chest tightness.   Cardiovascular: Negative for chest pain and palpitations.  Gastrointestinal: Negative for abdominal pain, constipation, diarrhea, nausea and vomiting.  Endocrine: Negative.   Genitourinary: Negative for dysuria and frequency.  Musculoskeletal: Negative for arthralgias, back pain, joint swelling and neck pain.  Skin: Negative for rash.  Allergic/Immunologic: Negative.   Neurological: Negative for tremors and numbness.  Hematological: Negative for adenopathy. Does not bruise/bleed easily.  Psychiatric/Behavioral: Negative for behavioral problems and sleep disturbance. The patient is not nervous/anxious.     Vital Signs: BP 140/80   Pulse 95   Resp 16   Ht 5\' 7"  (1.702 m)   Wt 166 lb (75.3 kg)   SpO2 98%   BMI 26.00 kg/m    Physical Exam  Constitutional: She is oriented to person, place, and time. She appears well-developed and well-nourished. No distress.  HENT:  Head: Normocephalic and atraumatic.  Mouth/Throat:  Oropharynx is clear and moist. No oropharyngeal exudate.  Eyes: Pupils are equal, round, and reactive to light. EOM are normal.  Neck: Normal range of motion. Neck supple. No JVD present. No tracheal deviation present. No thyromegaly present.  Cardiovascular: Normal rate, regular rhythm and normal heart sounds. Exam reveals no gallop and no friction rub.  No murmur heard. Pulmonary/Chest: Effort normal and breath sounds normal. No respiratory distress. She has no wheezes. She has no rales. She exhibits no tenderness.  Abdominal: Soft. There is no tenderness. There is no guarding.  Musculoskeletal: Normal range of motion.  Lymphadenopathy:    She has no cervical adenopathy.  Neurological: She is alert and oriented to person, place,  and time. No cranial nerve deficit.  Skin: Skin is warm and dry. She is not diaphoretic.  Psychiatric: She has a normal mood and affect. Her behavior is normal. Judgment and thought content normal.  Nursing note and vitals reviewed.   Assessment/Plan: 1. Sarcoidosis of lung (HCC) Continue to follow up with Pulmonary as needed.  - predniSONE (DELTASONE) 10 MG tablet; Take 1 tablet (10 mg total) by mouth daily.  Dispense: 30 tablet; Refill: 3 - predniSONE (DELTASONE) 1 MG tablet; Take 2  Tablets daily  Dispense: 60 tablet; Refill: 3  2. URI, acute Azithromycin 250mg  po #6, no refills.  If symptoms do not resolve in 8-10 days return to clinic.  3. Episode of recurrent major depressive disorder, unspecified depression episode severity (HCC) Refilled medication, pt denies side effects.  - mirtazapine (REMERON) 30 MG tablet; Take 1 tablet (30 mg total) by mouth at bedtime.  Dispense: 90 tablet; Refill: 0  4. Unspecified menopausal and perimenopausal disorder Refilled medication.  Pt denies side effects. - FLUoxetine (PROZAC) 10 MG tablet; Take 1 tablet (10 mg total) by mouth daily.  Dispense: 30 tablet; Refill: 3  General Counseling: Rainbow verbalizes  understanding of the findings of todays visit and agrees with plan of treatment. I have discussed any further diagnostic evaluation that may be needed or ordered today. We also reviewed her medications today. she has been encouraged to call the office with any questions or concerns that should arise related to todays visit.   Meds ordered this encounter  Medications  . predniSONE (DELTASONE) 10 MG tablet    Sig: Take 1 tablet (10 mg total) by mouth daily.    Dispense:  30 tablet    Refill:  3  . predniSONE (DELTASONE) 1 MG tablet    Sig: Take 2  Tablets daily    Dispense:  60 tablet    Refill:  3  . mirtazapine (REMERON) 30 MG tablet    Sig: Take 1 tablet (30 mg total) by mouth at bedtime.    Dispense:  90 tablet    Refill:  0  . FLUoxetine (PROZAC) 10 MG tablet    Sig: Take 1 tablet (10 mg total) by mouth daily.    Dispense:  30 tablet    Refill:  3  . azithromycin (ZITHROMAX) 250 MG tablet    Sig: Take 2 pills on day one. Take 1 pill on days 2-5.    Dispense:  6 tablet    Refill:  0    Time spent: 25 Minutes   This patient was seen by Blima Ledger AGNP-C in Collaboration with Dr Lyndon Code as a part of collaborative care agreement    Dr Lyndon Code Internal medicine

## 2018-04-26 NOTE — Patient Instructions (Signed)
Upper Respiratory Infection, Adult Most upper respiratory infections (URIs) are caused by a virus. A URI affects the nose, throat, and upper air passages. The most common type of URI is often called "the common cold." Follow these instructions at home:  Take medicines only as told by your doctor.  Gargle warm saltwater or take cough drops to comfort your throat as told by your doctor.  Use a warm mist humidifier or inhale steam from a shower to increase air moisture. This may make it easier to breathe.  Drink enough fluid to keep your pee (urine) clear or pale yellow.  Eat soups and other clear broths.  Have a healthy diet.  Rest as needed.  Go back to work when your fever is gone or your doctor says it is okay. ? You may need to stay home longer to avoid giving your URI to others. ? You can also wear a face mask and wash your hands often to prevent spread of the virus.  Use your inhaler more if you have asthma.  Do not use any tobacco products, including cigarettes, chewing tobacco, or electronic cigarettes. If you need help quitting, ask your doctor. Contact a doctor if:  You are getting worse, not better.  Your symptoms are not helped by medicine.  You have chills.  You are getting more short of breath.  You have brown or red mucus.  You have yellow or brown discharge from your nose.  You have pain in your face, especially when you bend forward.  You have a fever.  You have puffy (swollen) neck glands.  You have pain while swallowing.  You have white areas in the back of your throat. Get help right away if:  You have very bad or constant: ? Headache. ? Ear pain. ? Pain in your forehead, behind your eyes, and over your cheekbones (sinus pain). ? Chest pain.  You have long-lasting (chronic) lung disease and any of the following: ? Wheezing. ? Long-lasting cough. ? Coughing up blood. ? A change in your usual mucus.  You have a stiff neck.  You have  changes in your: ? Vision. ? Hearing. ? Thinking. ? Mood. This information is not intended to replace advice given to you by your health care provider. Make sure you discuss any questions you have with your health care provider. Document Released: 02/01/2008 Document Revised: 04/17/2016 Document Reviewed: 11/20/2013 Elsevier Interactive Patient Education  2018 Elsevier Inc.  

## 2018-04-27 ENCOUNTER — Ambulatory Visit: Payer: Self-pay | Admitting: Nurse Practitioner

## 2018-07-09 ENCOUNTER — Other Ambulatory Visit: Payer: Self-pay | Admitting: Nurse Practitioner

## 2018-07-09 DIAGNOSIS — D860 Sarcoidosis of lung: Secondary | ICD-10-CM

## 2018-07-16 ENCOUNTER — Other Ambulatory Visit: Payer: Self-pay

## 2018-08-06 ENCOUNTER — Ambulatory Visit: Payer: Managed Care, Other (non HMO) | Admitting: Adult Health

## 2018-08-06 ENCOUNTER — Encounter: Payer: Self-pay | Admitting: Adult Health

## 2018-08-06 VITALS — BP 118/88 | HR 99 | Temp 98.3°F | Ht 66.0 in | Wt 170.0 lb

## 2018-08-06 DIAGNOSIS — D860 Sarcoidosis of lung: Secondary | ICD-10-CM | POA: Diagnosis not present

## 2018-08-06 DIAGNOSIS — J0110 Acute frontal sinusitis, unspecified: Secondary | ICD-10-CM

## 2018-08-06 DIAGNOSIS — R52 Pain, unspecified: Secondary | ICD-10-CM | POA: Diagnosis not present

## 2018-08-06 DIAGNOSIS — J029 Acute pharyngitis, unspecified: Secondary | ICD-10-CM | POA: Diagnosis not present

## 2018-08-06 LAB — POCT INFLUENZA A/B
Influenza A, POC: NEGATIVE
Influenza B, POC: NEGATIVE

## 2018-08-06 MED ORDER — AMOXICILLIN-POT CLAVULANATE 875-125 MG PO TABS: 1.0000 | ORAL_TABLET | Freq: Two times a day (BID) | ORAL | 0 refills | Status: DC

## 2018-08-06 MED ORDER — PREDNISONE 10 MG PO TABS: 10.0000 mg | ORAL_TABLET | Freq: Every day | ORAL | 5 refills | Status: DC

## 2018-08-06 NOTE — Progress Notes (Signed)
[facility] [address] [place], [place] [address]  Internal MEDICINE  Office Visit Note  Patient Name: Brittany Huynh  [id number]  [id number]  Date of Service: 08/06/2018  Chief Complaint  Patient presents with  . Cough  . Headache  . Generalized Body Aches  . Sore Throat     HPI Pt is here for a sick visit.  Patient reports over a week of postnasal drip, cough, headache, generalized body aches, and sore throat and sinus pressure.  He is negative for flu at this visit.  She denies any sick contacts at this time however given her history of sarcoidosis she wanted to be checked out since the symptoms seem to be persisting.  She denies fever or chills currently.     Current Medication:  Outpatient Encounter Medications as of 08/06/2018  Medication Sig  . albuterol (PROVENTIL HFA;VENTOLIN HFA) 108 (90 BASE) MCG/ACT inhaler Inhale 2 puffs into the lungs every 6 (six) hours as needed for wheezing or shortness of breath.   Marland Kitchen aspirin-acetaminophen-caffeine (EXCEDRIN MIGRAINE) 250-250-65 MG per tablet Take 1 tablet by mouth every 6 (six) hours as needed for headache.   . clotrimazole-betamethasone (LOTRISONE) cream Apply 1 application topically 2 (two) times daily.  Marland Kitchen FLUoxetine (PROZAC) 10 MG tablet Take 1 tablet (10 mg total) by mouth daily.  . Homeopathic Products (ARNICARE BRUISE EX) Apply 1 application topically at bedtime.  . mirtazapine (REMERON) 30 MG tablet Take 1 tablet (30 mg total) by mouth at bedtime.  . pramipexole (MIRAPEX) 1 MG tablet Take 1 tablet (1 mg total) by mouth at bedtime.  . predniSONE (DELTASONE) 1 MG tablet Take 2  Tablets daily  . predniSONE (DELTASONE) 1 MG tablet TAKE 2 TABLETS BY MOUTH EVERY DAY  . predniSONE (DELTASONE) 10 MG tablet Take 1 tablet (10 mg total) by mouth daily.  Marland Kitchen zolmitriptan (ZOMIG) 5 MG nasal solution Place 1 spray into the nose as needed for migraine.  . [DISCONTINUED] predniSONE (DELTASONE) 10 MG tablet Take 1 tablet  (10 mg total) by mouth daily.  Marland Kitchen amoxicillin-clavulanate (AUGMENTIN) 875-125 MG tablet Take 1 tablet by mouth 2 (two) times daily.  . [DISCONTINUED] azithromycin (ZITHROMAX) 250 MG tablet Take 2 pills on day one. Take 1 pill on days 2-5. (Patient not taking: Reported on 08/06/2018)   No facility-administered encounter medications on file as of 08/06/2018.       Medical History: Past Medical History:  Diagnosis Date  . Anxiety   . Asthma   . Breast fibrocystic disorder 2013  . Hernia 2011   chest wall  . Lung mass 2011  . Sarcoidosis   . Shortness of breath    2011     Vital Signs: BP 118/88 (BP Location: Right Arm, Patient Position: Sitting, Cuff Size: Normal)   Pulse 99   Temp 98.3 F (36.8 C) (Oral)   Ht 5\' 6"  (1.676 m)   Wt 170 lb (77.1 kg)   SpO2 100%   BMI 27.44 kg/m    Review of Systems  Constitutional: Negative for chills, fatigue and unexpected weight change.  HENT: Positive for postnasal drip, rhinorrhea, sinus pressure, sinus pain, sneezing and sore throat. Negative for congestion.   Eyes: Negative for photophobia, pain and redness.  Respiratory: Positive for cough. Negative for chest tightness and shortness of breath.   Cardiovascular: Negative for chest pain and palpitations.  Gastrointestinal: Negative for abdominal pain, constipation, diarrhea, nausea and vomiting.  Endocrine: Negative.   Genitourinary: Negative for dysuria and frequency.  Musculoskeletal: Positive for myalgias. Negative for arthralgias, back pain, joint swelling and neck pain.  Skin: Negative for rash.  Allergic/Immunologic: Negative.   Neurological: Negative for tremors and numbness.  Hematological: Negative for adenopathy. Does not bruise/bleed easily.  Psychiatric/Behavioral: Negative for behavioral problems and sleep disturbance. The patient is not nervous/anxious.     Physical Exam  Constitutional: She is oriented to person, place, and time. She appears well-developed and  well-nourished. She appears ill. No distress.  HENT:  Head: Normocephalic and atraumatic.  Mouth/Throat: Oropharynx is clear and moist. No oropharyngeal exudate.  Eyes: Pupils are equal, round, and reactive to light. EOM are normal.  Neck: Normal range of motion. Neck supple. No JVD present. No tracheal deviation present. No thyromegaly present.  Cardiovascular: Normal rate, regular rhythm and normal heart sounds. Exam reveals no gallop and no friction rub.  No murmur heard. Pulmonary/Chest: Effort normal and breath sounds normal. No respiratory distress. She has no wheezes. She has no rales. She exhibits no tenderness.  Abdominal: Soft. There is no tenderness. There is no guarding.  Musculoskeletal: Normal range of motion.  Lymphadenopathy:    She has no cervical adenopathy.  Neurological: She is alert and oriented to person, place, and time. No cranial nerve deficit.  Skin: Skin is warm and dry. She is not diaphoretic.  Psychiatric: She has a normal mood and affect. Her behavior is normal. Judgment and thought content normal.  Nursing note and vitals reviewed.  Assessment/Plan:  1. Acute non-recurrent frontal sinusitis Patient started on course of Augmentin for frontal sinusitis.  We discussed that she should return to clinic if her symptoms fail to improve or worsen. - amoxicillin-clavulanate (AUGMENTIN) 875-125 MG tablet; Take 1 tablet by mouth 2 (two) times daily.  Dispense: 20 tablet; Refill: 0  2. Sore throat Patient negative for influenza a and B at today's visit.  Sore throat is likely from postnasal drip from sinusitis.  Encourage patient to use homeopathic remedies as well as over-the-counter medications to soothe sore throat.  She verbalized understanding. - POCT Influenza A/B  3. Sarcoidosis of lung (HCC) Stable, refill patient's prednisone prescription at this visit.  General Counseling: Brittany Huynh understanding of the findings of todays visit and agrees with plan of  treatment. I have discussed any further diagnostic evaluation that may be needed or ordered today. We also reviewed her medications today. she has been encouraged to call the office with any questions or concerns that should arise related to todays visit.   Orders Placed This Encounter  Procedures  . POCT Influenza A/B    Meds ordered this encounter  Medications  . amoxicillin-clavulanate (AUGMENTIN) 875-125 MG tablet    Sig: Take 1 tablet by mouth 2 (two) times daily.    Dispense:  20 tablet    Refill:  0  . predniSONE (DELTASONE) 10 MG tablet    Sig: Take 1 tablet (10 mg total) by mouth daily.    Dispense:  30 tablet    Refill:  5    Time spent: 25 Minutes  This patient was seen by Blima Ledger AGNP-C in Collaboration with Dr Lyndon Code as a part of collaborative care agreement.  [name] AGNP-C Internal Medicine

## 2018-11-13 ENCOUNTER — Encounter: Payer: Self-pay | Admitting: Adult Health

## 2018-11-13 ENCOUNTER — Ambulatory Visit: Payer: Managed Care, Other (non HMO) | Admitting: Adult Health

## 2018-11-13 ENCOUNTER — Telehealth: Payer: Self-pay | Admitting: Adult Health

## 2018-11-13 ENCOUNTER — Other Ambulatory Visit: Payer: Self-pay

## 2018-11-13 VITALS — BP 140/92 | HR 108 | Resp 16 | Ht 66.0 in | Wt 169.0 lb

## 2018-11-13 DIAGNOSIS — D860 Sarcoidosis of lung: Secondary | ICD-10-CM

## 2018-11-13 DIAGNOSIS — R009 Unspecified abnormalities of heart beat: Secondary | ICD-10-CM | POA: Diagnosis not present

## 2018-11-13 DIAGNOSIS — R030 Elevated blood-pressure reading, without diagnosis of hypertension: Secondary | ICD-10-CM

## 2018-11-13 DIAGNOSIS — F339 Major depressive disorder, recurrent, unspecified: Secondary | ICD-10-CM | POA: Diagnosis not present

## 2018-11-13 DIAGNOSIS — N959 Unspecified menopausal and perimenopausal disorder: Secondary | ICD-10-CM

## 2018-11-13 DIAGNOSIS — Z1211 Encounter for screening for malignant neoplasm of colon: Secondary | ICD-10-CM

## 2018-11-13 MED ORDER — FLUOXETINE HCL 20 MG PO TABS: 20.0000 mg | ORAL_TABLET | Freq: Every day | ORAL | 1 refills | Status: DC

## 2018-11-13 MED ORDER — PREDNISONE 1 MG PO TABS: ORAL_TABLET | ORAL | 3 refills | Status: DC

## 2018-11-13 MED ORDER — MIRTAZAPINE 30 MG PO TABS: 30.0000 mg | ORAL_TABLET | Freq: Every day | ORAL | 0 refills | Status: DC

## 2018-11-13 MED ORDER — PREDNISONE 10 MG PO TABS: 10.0000 mg | ORAL_TABLET | Freq: Every day | ORAL | 5 refills | Status: DC

## 2018-11-13 MED ORDER — ALBUTEROL SULFATE HFA 108 (90 BASE) MCG/ACT IN AERS: 2.0000 | INHALATION_SPRAY | Freq: Four times a day (QID) | RESPIRATORY_TRACT | 3 refills | Status: DC | PRN

## 2018-11-13 NOTE — Progress Notes (Signed)
[facility] [address] [place], [place] [address]  Internal MEDICINE  Office Visit Note  Patient Name: Brittany CannyKathy E Rinke  [id number] 22, 2067  [id number]  Date of Service: 11/14/2018  Chief Complaint  Patient presents with  . Asthma  . Quality Metric Gaps    colonoscopy    HPI Pt is here for follow up on asthma.  She reports DOE.  She reports its worse when bending over the put on shoes, or walk up steps.  She reports heart racing, and SOB that instantly affects her in these situations.      Current Medication: Outpatient Encounter Medications as of 11/13/2018  Medication Sig  . aspirin-acetaminophen-caffeine (EXCEDRIN MIGRAINE) 250-250-65 MG per tablet Take 1 tablet by mouth every 6 (six) hours as needed for headache.   . Homeopathic Products (ARNICARE BRUISE EX) Apply 1 application topically at bedtime.  . mirtazapine (REMERON) 30 MG tablet Take 1 tablet (30 mg total) by mouth at bedtime.  . predniSONE (DELTASONE) 1 MG tablet Take 2  Tablets daily  . predniSONE (DELTASONE) 10 MG tablet Take 1 tablet (10 mg total) by mouth daily.  . [DISCONTINUED] mirtazapine (REMERON) 30 MG tablet Take 1 tablet (30 mg total) by mouth at bedtime.  . [DISCONTINUED] predniSONE (DELTASONE) 1 MG tablet Take 2  Tablets daily  . [DISCONTINUED] predniSONE (DELTASONE) 10 MG tablet Take 1 tablet (10 mg total) by mouth daily.  Marland Kitchen. albuterol (PROVENTIL HFA;VENTOLIN HFA) 108 (90 Base) MCG/ACT inhaler Inhale 2 puffs into the lungs every 6 (six) hours as needed for wheezing or shortness of breath.  Marland Kitchen. FLUoxetine (PROZAC) 20 MG tablet Take 1 tablet (20 mg total) by mouth daily.  . [DISCONTINUED] albuterol (PROVENTIL HFA;VENTOLIN HFA) 108 (90 BASE) MCG/ACT inhaler Inhale 2 puffs into the lungs every 6 (six) hours as needed for wheezing or shortness of breath.   . [DISCONTINUED] amoxicillin-clavulanate (AUGMENTIN) 875-125 MG tablet Take 1 tablet by mouth 2 (two) times daily. (Patient not taking: Reported on  11/13/2018)  . [DISCONTINUED] clotrimazole-betamethasone (LOTRISONE) cream Apply 1 application topically 2 (two) times daily. (Patient not taking: Reported on 11/13/2018)  . [DISCONTINUED] FLUoxetine (PROZAC) 10 MG tablet Take 1 tablet (10 mg total) by mouth daily. (Patient not taking: Reported on 11/13/2018)  . [DISCONTINUED] pramipexole (MIRAPEX) 1 MG tablet Take 1 tablet (1 mg total) by mouth at bedtime. (Patient not taking: Reported on 11/13/2018)  . [DISCONTINUED] predniSONE (DELTASONE) 1 MG tablet TAKE 2 TABLETS BY MOUTH EVERY DAY  . [DISCONTINUED] zolmitriptan (ZOMIG) 5 MG nasal solution Place 1 spray into the nose as needed for migraine. (Patient not taking: Reported on 11/13/2018)   No facility-administered encounter medications on file as of 11/13/2018.     Surgical History: Past Surgical History:  Procedure Laterality Date  . ABDOMINAL HYSTERECTOMY  2009  . BREAST BIOPSY Left 02/15/2018   PREDOMINANTLY MATURE ADIPOSE TISSUE WITH VERY FOCAL AREAS OF BENIGN   . chamberlain procedure   2011  . IRRIGATION AND DEBRIDEMENT HEMATOMA Left 03/07/2018   Procedure: IRRIGATION AND DEBRIDEMENT HEMATOMA-LEFT BREAST;  Surgeon: Earline MayotteByrnett, Jeffrey W, MD;  Location: [facility] ORS;  Service: General;  Laterality: Left;  . LUNG BIOPSY  2011  . RESECTION RIBS EXTRAPLEURAL  [id number]    Medical History: Past Medical History:  Diagnosis Date  . Anxiety   . Asthma   . Breast fibrocystic disorder 2013  . Hernia 2011   chest wall  . Lung mass 2011  . Sarcoidosis   . Shortness of breath  2011    Family History: Family History  Problem Relation Age of Onset  . Liver cancer Mother 97  . Bone cancer Maternal Aunt 60  . Breast cancer Maternal Aunt   . Breast cancer Paternal Aunt   . Bone cancer Paternal Aunt 71    Social History   Socioeconomic History  . Marital status: Single    Spouse name: Not on file  . Number of children: Not on file  . Years of education: Not on file  . Highest education  level: Not on file  Occupational History  . Not on file  Social Needs  . Financial resource strain: Not on file  . Food insecurity:    Worry: Not on file    Inability: Not on file  . Transportation needs:    Medical: Not on file    Non-medical: Not on file  Tobacco Use  . Smoking status: Never Smoker  . Smokeless tobacco: Never Used  Substance and Sexual Activity  . Alcohol use: Yes    Comment: on occassion  . Drug use: No  . Sexual activity: Not on file  Lifestyle  . Physical activity:    Days per week: Not on file    Minutes per session: Not on file  . Stress: Not on file  Relationships  . Social connections:    Talks on phone: Not on file    Gets together: Not on file    Attends religious service: Not on file    Active member of club or organization: Not on file    Attends meetings of clubs or organizations: Not on file    Relationship status: Not on file  . Intimate partner violence:    Fear of current or ex partner: Not on file    Emotionally abused: Not on file    Physically abused: Not on file    Forced sexual activity: Not on file  Other Topics Concern  . Not on file  Social History Narrative  . Not on file      Review of Systems  Constitutional: Positive for fatigue. Negative for chills and unexpected weight change.  HENT: Negative for congestion, rhinorrhea, sneezing and sore throat.   Eyes: Negative for photophobia, pain and redness.  Respiratory: Positive for shortness of breath. Negative for cough and chest tightness.   Cardiovascular: Negative for chest pain and palpitations.  Gastrointestinal: Negative for abdominal pain, constipation, diarrhea, nausea and vomiting.  Endocrine: Negative.   Genitourinary: Negative for dysuria and frequency.  Musculoskeletal: Negative for arthralgias, back pain, joint swelling and neck pain.  Skin: Negative for rash.  Allergic/Immunologic: Negative.   Neurological: Negative for tremors and numbness.   Hematological: Negative for adenopathy. Does not bruise/bleed easily.  Psychiatric/Behavioral: Negative for behavioral problems and sleep disturbance. The patient is not nervous/anxious.     Vital Signs: BP (!) 140/92   Pulse (!) 108   Resp 16   Ht  (1.676 m)   Wt 169 lb (76.7 kg)   SpO2 97%   BMI 27.28 kg/m    Physical Exam Vitals signs and nursing note reviewed.  Constitutional:      General: She is not in acute distress.    Appearance: She is well-developed. She is not diaphoretic.  HENT:     Head: Normocephalic and atraumatic.     Mouth/Throat:     Pharynx: No oropharyngeal exudate.  Eyes:     Pupils: Pupils are equal, round, and reactive to light.  Neck:  Musculoskeletal: Normal range of motion and neck supple.     Thyroid: No thyromegaly.     Vascular: No JVD.     Trachea: No tracheal deviation.  Cardiovascular:     Rate and Rhythm: Normal rate and regular rhythm.     Heart sounds: Normal heart sounds. No murmur. No friction rub. No gallop.   Pulmonary:     Effort: Pulmonary effort is normal. No respiratory distress.     Breath sounds: Normal breath sounds. No wheezing or rales.  Chest:     Chest wall: No tenderness.  Abdominal:     Palpations: Abdomen is soft.     Tenderness: There is no abdominal tenderness. There is no guarding.  Musculoskeletal: Normal range of motion.  Lymphadenopathy:     Cervical: No cervical adenopathy.  Skin:    General: Skin is warm and dry.  Neurological:     Mental Status: She is alert and oriented to person, place, and time.     Cranial Nerves: No cranial nerve deficit.  Psychiatric:        Behavior: Behavior normal.        Thought Content: Thought content normal.        Judgment: Judgment normal.    Assessment/Plan: 1. Elevated heart rate with elevated blood pressure without the diagnosis of hypertension Pt reports some serious job stress at this time.  She denies any chest pain, sob, headache or palpitations.  I  have asked her to take her bp at home, to see if it is elevated at home. Will follow up in 6 weeks at reassess.  IF she is still elevated, will place on low dose antihypertensive.     2. Sarcoidosis of lung (HCC) Overall her lungs are stable.  She denies new symptoms.  Refilled her inhaler and prednisone tablets.  - albuterol (PROVENTIL HFA;VENTOLIN HFA) 108 (90 Base) MCG/ACT inhaler; Inhale 2 puffs into the lungs every 6 (six) hours as needed for wheezing or shortness of breath.  Dispense: 1 Inhaler; Refill: 3 - predniSONE (DELTASONE) 1 MG tablet; Take 2  Tablets daily  Dispense: 60 tablet; Refill: 3 - predniSONE (DELTASONE) 10 MG tablet; Take 1 tablet (10 mg total) by mouth daily.  Dispense: 30 tablet; Refill: 5  3. Episode of recurrent major depressive disorder, unspecified depression episode severity (HCC) Refilled patients medications.  - mirtazapine (REMERON) 30 MG tablet; Take 1 tablet (30 mg total) by mouth at bedtime.  Dispense: 90 tablet; Refill: 0  4. Unspecified menopausal and perimenopausal disorder Restarting patient Prozac to comab perimenopausal disorder.  Also discussed depression can have physical symptoms.  Will check in with patient in 6-8 weeks to see how she is doing. - FLUoxetine (PROZAC) 20 MG tablet; Take 1 tablet (20 mg total) by mouth daily.  Dispense: 90 tablet; Refill: 1  5. Screen for colon cancer Cologuard sent for patient.   General Counseling: lakiesha rains understanding of the findings of todays visit and agrees with plan of treatment. I have discussed any further diagnostic evaluation that may be needed or ordered today. We also reviewed her medications today. she has been encouraged to call the office with any questions or concerns that should arise related to todays visit.    No orders of the defined types were placed in this encounter.   Meds ordered this encounter  Medications  . albuterol (PROVENTIL HFA;VENTOLIN HFA) 108 (90 Base) MCG/ACT inhaler     Sig: Inhale 2 puffs into the lungs every 6 (six) hours  as needed for wheezing or shortness of breath.    Dispense:  1 Inhaler    Refill:  3  . predniSONE (DELTASONE) 1 MG tablet    Sig: Take 2  Tablets daily    Dispense:  60 tablet    Refill:  3  . predniSONE (DELTASONE) 10 MG tablet    Sig: Take 1 tablet (10 mg total) by mouth daily.    Dispense:  30 tablet    Refill:  5  . mirtazapine (REMERON) 30 MG tablet    Sig: Take 1 tablet (30 mg total) by mouth at bedtime.    Dispense:  90 tablet    Refill:  0  . FLUoxetine (PROZAC) 20 MG tablet    Sig: Take 1 tablet (20 mg total) by mouth daily.    Dispense:  90 tablet    Refill:  1    Time spent: 30 Minutes   This patient was seen by Blima LedgerAdam Deseri Loss AGNP-C in Collaboration with Dr Lyndon CodeFozia M Khan as a part of collaborative care agreement     Johnna AcostaAdam J. Corlette Ciano AGNP-C Internal medicine

## 2018-11-13 NOTE — Telephone Encounter (Signed)
Faxed order for cologuard to [facility].

## 2018-12-06 ENCOUNTER — Ambulatory Visit: Payer: 59 | Admitting: Nurse Practitioner

## 2018-12-06 ENCOUNTER — Encounter: Payer: Self-pay | Admitting: Internal Medicine

## 2018-12-06 ENCOUNTER — Other Ambulatory Visit: Payer: Self-pay

## 2018-12-06 VITALS — Resp 16 | Ht 66.0 in | Wt 162.0 lb

## 2018-12-06 DIAGNOSIS — D860 Sarcoidosis of lung: Secondary | ICD-10-CM

## 2018-12-06 DIAGNOSIS — J45909 Unspecified asthma, uncomplicated: Secondary | ICD-10-CM

## 2018-12-06 NOTE — Progress Notes (Signed)
[facility] [address] [place], [place] [address]  Internal MEDICINE  Telephone Visit  Patient Name: Brittany Huynh  [id number]  [id number]  Date of Service: 12/06/2018  I connected with the patient at 11:00am by web cam. Verified the patients identity using two identifiers.   I discussed the limitations, risks, security and privacy concerns of performing an evaluation and management service by webcam and the availability of in person appointments. I also discussed with the patient that there may be a patient responsible charge related to the service.  The patient expressed understanding and agrees to proceed.    Chief Complaint  Patient presents with  . 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 Sarcoidosis  . Asthma    The patient has been contacted via webcam for follow up visit due to concerns for spread of novel coronavirus. The patient works for hospice. Due to concern of Covid, she and other coworkers are not able to go into home.  o avoid being laid off during this time, her company has assigned many employees to work in jobs, keeping them outside. This patient has history of sarcoid, asthma, and breathing issues, especially when exposed to pollen and excess heat. Due to these changes, she has been out of work since November 28, 2018. There are many jobs she is able to perform indoors, however, health issues have to be confirmed by healthcare provider and submitted in writing before they will find her a position indoors.       Current Medication: Outpatient Encounter Medications as of 12/06/2018  Medication Sig  . albuterol (PROVENTIL HFA;VENTOLIN HFA) 108 (90 Base) MCG/ACT inhaler Inhale 2 puffs into the lungs every 6 (six) hours as needed for wheezing or shortness of breath.  Marland Kitchen aspirin-acetaminophen-caffeine (EXCEDRIN MIGRAINE)  250-250-65 MG per tablet Take 1 tablet by mouth every 6 (six) hours as needed for headache.   Marland Kitchen FLUoxetine (PROZAC) 20 MG tablet Take 1 tablet (20 mg total) by mouth daily.  . mirtazapine (REMERON) 30 MG tablet Take 1 tablet (30 mg total) by mouth at bedtime.  . predniSONE (DELTASONE) 1 MG tablet Take 2  Tablets daily  . predniSONE (DELTASONE) 10 MG tablet Take 1 tablet (10 mg total) by mouth daily.  . [DISCONTINUED] Homeopathic Products (ARNICARE BRUISE EX) Apply 1 application topically at bedtime.   No facility-administered encounter medications on file as of 12/06/2018.     Surgical History: Past Surgical History:  Procedure Laterality Date  . ABDOMINAL HYSTERECTOMY  2009  . BREAST BIOPSY Left 02/15/2018   PREDOMINANTLY MATURE ADIPOSE TISSUE WITH VERY FOCAL AREAS OF BENIGN   . chamberlain procedure   2011  . IRRIGATION AND DEBRIDEMENT HEMATOMA Left 03/07/2018   Procedure: IRRIGATION AND DEBRIDEMENT HEMATOMA-LEFT BREAST;  Surgeon: Earline Mayotte, MD;  Location: [facility] ORS;  Service: General;  Laterality: Left;  . LUNG BIOPSY  2011  . RESECTION RIBS EXTRAPLEURAL  [id number]    Medical History: Past Medical History:  Diagnosis Date  . Anxiety   . Asthma   . Breast fibrocystic disorder 2013  . Hernia 2011   chest wall  . Lung mass 2011  . Sarcoidosis   . Shortness of breath    2011    Family History: Family History  Problem Relation Age of Onset  . Liver cancer Mother 4  .  Bone cancer Maternal Aunt 60  . Breast cancer Maternal Aunt   . Breast cancer Paternal Aunt   . Bone cancer Paternal Aunt 47    Social31 History   Socioeconomic History  . Marital status: Single    Spouse name: Not on file  . Number of children: Not on file  . Years of education: Not on file  . Highest education level: Not on file  Occupational History  . Not on file  Social Needs  . Financial resource strain: Not on file  . Food insecurity:    Worry: Not on file    Inability: Not on file  .  Transportation needs:    Medical: Not on file    Non-medical: Not on file  Tobacco Use  . Smoking status: Never Smoker  . Smokeless tobacco: Never Used  Substance and Sexual Activity  . Alcohol use: Yes    Comment: on occassion  . Drug use: No  . Sexual activity: Not on file  Lifestyle  . Physical activity:    Days per week: Not on file    Minutes per session: Not on file  . Stress: Not on file  Relationships  . Social connections:    Talks on phone: Not on file    Gets together: Not on file    Attends religious service: Not on file    Active member of club or organization: Not on file    Attends meetings of clubs or organizations: Not on file    Relationship status: Not on file  . Intimate partner violence:    Fear of current or ex partner: Not on file    Emotionally abused: Not on file    Physically abused: Not on file    Forced sexual activity: Not on file  Other Topics Concern  . Not on file  Social History Narrative  . Not on file      Review of Systems  Constitutional: Negative for activity change, chills, fatigue and unexpected weight change.  HENT: Negative for congestion, postnasal drip, rhinorrhea, sneezing and sore throat.   Respiratory: Positive for chest tightness, shortness of breath and wheezing. Negative for cough.   Cardiovascular: Negative for chest pain, palpitations and leg swelling.  Gastrointestinal: Negative for abdominal pain, constipation, diarrhea, nausea and vomiting.  Endocrine: Negative for cold intolerance, heat intolerance, polydipsia and polyuria.  Genitourinary: Negative for difficulty urinating, dyspareunia, dysuria, flank pain and frequency.  Musculoskeletal: Positive for arthralgias and myalgias. Negative for back pain, joint swelling and neck pain.  Skin: Negative for rash.  Allergic/Immunologic: Positive for environmental allergies.  Neurological: Positive for dizziness and headaches. Negative for tremors, weakness and numbness.   Hematological: Negative for adenopathy. Does not bruise/bleed easily.  Psychiatric/Behavioral: Positive for dysphoric mood and sleep disturbance. Negative for behavioral problems (Depression) and suicidal ideas. The patient is nervous/anxious.     Today's Vitals   12/06/18 1050  Resp: 16  Weight: 162 lb (73.5 kg)  Height: 5\' 6"  (1.676 m)   Body mass index is 26.15 kg/m.  Observation/Objective:  The patient is alert and oriented. She is comfortable with no shortness of breath, wheezes, or cough heard at this time.    Assessment/Plan: 1. Sarcoidosis of lung (HCC) Due to history of sarcoid, patient should be given opportunity to work indoors rather than outdoors to avoid an increased number of triggers for lung inflammation and infection. A letter will be composed for her employer to provide her indoor opportunities for employment.   2.  Moderate asthma without complication, unspecified whether persistent patient should be given opportunity to work indoors rather than outdoors to avoid an increased number of triggers for lung inflammation and infection. A letter will be composed for her employer to provide her indoor opportunities for employment.     General Counseling: azeneth kost understanding of the findings of today's phone visit and agrees with plan of treatment. I have discussed any further diagnostic evaluation that may be needed or ordered today. We also reviewed her medications today. she has been encouraged to call the office with any questions or concerns that should arise related to todays visit.  This patient was seen by Vincent Gros FNP Collaboration with Dr Lyndon Code as a part of collaborative care agreement   Time spent: 25 Minutes    Dr Lyndon Code Internal medicine

## 2018-12-06 NOTE — Progress Notes (Deleted)
[facility] [address] [place], [place] [address]  Internal MEDICINE  Office Visit Note  Patient Name: Brittany Huynh  [id number]  [id number]  Date of Service: 12/06/2018  Chief Complaint  Patient presents with  . 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 Telephone Assessment  . Sarcoidosis  . Asthma    HPI     Current Medication: Outpatient Encounter Medications as of 12/06/2018  Medication Sig  . albuterol (PROVENTIL HFA;VENTOLIN HFA) 108 (90 Base) MCG/ACT inhaler Inhale 2 puffs into the lungs every 6 (six) hours as needed for wheezing or shortness of breath.  Marland Kitchen aspirin-acetaminophen-caffeine (EXCEDRIN MIGRAINE) 250-250-65 MG per tablet Take 1 tablet by mouth every 6 (six) hours as needed for headache.   Marland Kitchen FLUoxetine (PROZAC) 20 MG tablet Take 1 tablet (20 mg total) by mouth daily.  . mirtazapine (REMERON) 30 MG tablet Take 1 tablet (30 mg total) by mouth at bedtime.  . predniSONE (DELTASONE) 1 MG tablet Take 2  Tablets daily  . predniSONE (DELTASONE) 10 MG tablet Take 1 tablet (10 mg total) by mouth daily.  . [DISCONTINUED] Homeopathic Products (ARNICARE BRUISE EX) Apply 1 application topically at bedtime.   No facility-administered encounter medications on file as of 12/06/2018.     Surgical History: Past Surgical History:  Procedure Laterality Date  . ABDOMINAL HYSTERECTOMY  2009  . BREAST BIOPSY Left 02/15/2018   PREDOMINANTLY MATURE ADIPOSE TISSUE WITH VERY FOCAL AREAS OF BENIGN   . chamberlain procedure   2011  . IRRIGATION AND DEBRIDEMENT HEMATOMA Left 03/07/2018   Procedure: IRRIGATION AND DEBRIDEMENT HEMATOMA-LEFT BREAST;  Surgeon: Earline Mayotte, MD;  Location: [facility] ORS;  Service: General;  Laterality: Left;  . LUNG BIOPSY  2011  . RESECTION RIBS EXTRAPLEURAL  [id number]    Medical History: Past Medical History:   Diagnosis Date  . Anxiety   . Asthma   . Breast fibrocystic disorder 2013  . Hernia 2011   chest wall  . Lung mass 2011  . Sarcoidosis   . Shortness of breath    2011    Family History: Family History  Problem Relation Age of Onset  . Liver cancer Mother 26  . Bone cancer Maternal Aunt 60  . Breast cancer Maternal Aunt   . Breast cancer Paternal Aunt   . Bone cancer Paternal Aunt 45    Social History   Socioeconomic History  . Marital status: Single    Spouse name: Not on file  . Number of children: Not on file  . Years of education: Not on file  . Highest education level: Not on file  Occupational History  . Not on file  Social Needs  . Financial resource strain: Not on file  . Food insecurity:    Worry: Not on file    Inability: Not on file  . Transportation needs:    Medical: Not on file    Non-medical: Not on file  Tobacco Use  . Smoking status: Never Smoker  . Smokeless tobacco: Never Used  Substance and Sexual Activity  . Alcohol use: Yes    Comment: on occassion  . Drug use: No  . Sexual activity: Not on file  Lifestyle  . Physical activity:    Days per week: Not on file    Minutes per session: Not on file  .  Stress: Not on file  Relationships  . Social connections:    Talks on phone: Not on file    Gets together: Not on file    Attends religious service: Not on file    Active member of club or organization: Not on file    Attends meetings of clubs or organizations: Not on file    Relationship status: Not on file  . Intimate partner violence:    Fear of current or ex partner: Not on file    Emotionally abused: Not on file    Physically abused: Not on file    Forced sexual activity: Not on file  Other Topics Concern  . Not on file  Social History Narrative  . Not on file      Review of Systems  Vital Signs: Resp 16   Ht 5\' 6"  (1.676 m)   Wt 162 lb (73.5 kg)   BMI 26.15 kg/m    Physical  Exam     Assessment/Plan:   General Counseling: Taquasia verbalizes understanding of the findings of todays visit and agrees with plan of treatment. I have discussed any further diagnostic evaluation that may be needed or ordered today. We also reviewed her medications today. she has been encouraged to call the office with any questions or concerns that should arise related to todays visit.    No orders of the defined types were placed in this encounter.   No orders of the defined types were placed in this encounter.   Time spent:*** Minutes      Dr Lyndon Code Internal medicine

## 2018-12-26 ENCOUNTER — Encounter: Payer: Self-pay | Admitting: Adult Health

## 2018-12-26 ENCOUNTER — Other Ambulatory Visit: Payer: Self-pay

## 2018-12-26 ENCOUNTER — Ambulatory Visit: Payer: 59 | Admitting: Adult Health

## 2018-12-26 NOTE — Progress Notes (Signed)
Patient needs to be seen in office , beth to rs for in office appointment

## 2018-12-27 ENCOUNTER — Ambulatory Visit: Payer: 59 | Admitting: Adult Health

## 2018-12-27 ENCOUNTER — Encounter: Payer: Self-pay | Admitting: Adult Health

## 2018-12-27 VITALS — BP 136/101 | HR 104 | Resp 16 | Ht 66.0 in | Wt 168.0 lb

## 2018-12-27 DIAGNOSIS — D860 Sarcoidosis of lung: Secondary | ICD-10-CM

## 2018-12-27 DIAGNOSIS — G629 Polyneuropathy, unspecified: Secondary | ICD-10-CM

## 2018-12-27 DIAGNOSIS — R5383 Other fatigue: Secondary | ICD-10-CM

## 2018-12-27 DIAGNOSIS — I10 Essential (primary) hypertension: Secondary | ICD-10-CM | POA: Diagnosis not present

## 2018-12-27 MED ORDER — LOSARTAN POTASSIUM 25 MG PO TABS: 25.0000 mg | ORAL_TABLET | Freq: Every day | ORAL | 0 refills | Status: DC

## 2018-12-27 MED ORDER — ROPINIROLE HCL 0.25 MG PO TABS: 0.2500 mg | ORAL_TABLET | Freq: Every day | ORAL | 0 refills | Status: DC

## 2018-12-27 NOTE — Progress Notes (Signed)
[facility] [address] [place], [place] [address]  Internal MEDICINE  Office Visit Note  Patient Name: Brittany Huynh  [id number]  [id number]  Date of Service: 12/27/2018  Chief Complaint  Patient presents with  . Medical Management of Chronic Issues    bp has been elevated , pulse is high , balance is off   . Sarcoidosis    feels like she is more sob then usual   . Numbness    numbess and pain in the feet and legs   . Fatigue    swollen lymph nodes in the armpits     HPI  Pt here reporting a few weeks of HTN, and feeling bad.  She has been more short of breath recently.  She had a history of sarcoid for which she has albuterol inhaler.  She is having to use it twice a day, 3-4 days a week currently.  She is also having numbness and pain in her feet and legs. She describes it as a burning sensation.  She can be sitting and it will happen, as well as standing.   The discomfort is constant.  She also complains of possible swollen lymph nodes in both of her armpits.     Current Medication: Outpatient Encounter Medications as of 12/27/2018  Medication Sig  . albuterol (PROVENTIL HFA;VENTOLIN HFA) 108 (90 Base) MCG/ACT inhaler Inhale 2 puffs into the lungs every 6 (six) hours as needed for wheezing or shortness of breath.  Marland Kitchen aspirin-acetaminophen-caffeine (EXCEDRIN MIGRAINE) 250-250-65 MG per tablet Take 1 tablet by mouth every 6 (six) hours as needed for headache.   Marland Kitchen FLUoxetine (PROZAC) 20 MG tablet Take 1 tablet (20 mg total) by mouth daily.  . mirtazapine (REMERON) 30 MG tablet Take 1 tablet (30 mg total) by mouth at bedtime.  . predniSONE (DELTASONE) 1 MG tablet Take 2  Tablets daily  . predniSONE (DELTASONE) 10 MG tablet Take 1 tablet (10 mg total) by mouth daily.   No facility-administered encounter medications on file as of 12/27/2018.     Surgical History: Past Surgical History:  Procedure Laterality Date  . ABDOMINAL HYSTERECTOMY  2009  . BREAST BIOPSY  Left 02/15/2018   PREDOMINANTLY MATURE ADIPOSE TISSUE WITH VERY FOCAL AREAS OF BENIGN   . chamberlain procedure   2011  . IRRIGATION AND DEBRIDEMENT HEMATOMA Left 03/07/2018   Procedure: IRRIGATION AND DEBRIDEMENT HEMATOMA-LEFT BREAST;  Surgeon: Earline Mayotte, MD;  Location: [facility] ORS;  Service: General;  Laterality: Left;  . LUNG BIOPSY  2011  . RESECTION RIBS EXTRAPLEURAL  [id number]    Medical History: Past Medical History:  Diagnosis Date  . Anxiety   . Asthma   . Breast fibrocystic disorder 2013  . Hernia 2011   chest wall  . Lung mass 2011  . Sarcoidosis   . Shortness of breath    2011    Family History: Family History  Problem Relation Age of Onset  . Liver cancer Mother 88  . Bone cancer Maternal Aunt 60  . Breast cancer Maternal Aunt   . Breast cancer Paternal Aunt   . Bone cancer Paternal Aunt 96    Social History   Socioeconomic History  . Marital status: Single    Spouse name: Not on file  . Number of children: Not on file  . Years of education: Not on file  . Highest education level: Not on file  Occupational History  . Not on file  Social Needs  . 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 Food insecurity:    Worry: Not on file    Inability: Not on file  . Transportation needs:    Medical: Not on file    Non-medical: Not on file  Tobacco Use  . Smoking status: Never Smoker  . Smokeless tobacco: Never Used  Substance and Sexual Activity  . Alcohol use: Yes    Comment: on occassion  . Drug use: No  . Sexual activity: Not on file  Lifestyle  . Physical activity:    Days per week: Not on file    Minutes per session: Not on file  . Stress: Not on file  Relationships  . Social connections:    Talks on phone: Not on file    Gets together: Not on file    Attends religious service: Not on file    Active member of club or organization: Not on file    Attends meetings of clubs or organizations: Not on file    Relationship status: Not on file  .  Intimate partner violence:    Fear of current or ex partner: Not on file    Emotionally abused: Not on file    Physically abused: Not on file    Forced sexual activity: Not on file  Other Topics Concern  . Not on file  Social History Narrative  . Not on file      Review of Systems  Constitutional: Negative for chills, fatigue and unexpected weight change.  HENT: Negative for congestion, rhinorrhea, sneezing and sore throat.   Eyes: Negative for photophobia, pain and redness.  Respiratory: Negative for cough, chest tightness and shortness of breath.   Cardiovascular: Negative for chest pain and palpitations.  Gastrointestinal: Negative for abdominal pain, constipation, diarrhea, nausea and vomiting.  Endocrine: Negative.   Genitourinary: Negative for dysuria and frequency.  Musculoskeletal: Negative for arthralgias, back pain, joint swelling and neck pain.  Skin: Negative for rash.  Allergic/Immunologic: Negative.   Neurological: Negative for tremors and numbness.  Hematological: Negative for adenopathy. Does not bruise/bleed easily.  Psychiatric/Behavioral: Negative for behavioral problems and sleep disturbance. The patient is not nervous/anxious.     Vital Signs: BP (!) 136/101   Pulse (!) 104   Resp 16   Ht 5\' 6"  (1.676 m)   Wt 168 lb (76.2 kg)   SpO2 96%   BMI 27.12 kg/m    Physical Exam Vitals signs and nursing note reviewed.  Constitutional:      General: She is not in acute distress.    Appearance: She is well-developed. She is not diaphoretic.  HENT:     Head: Normocephalic and atraumatic.     Mouth/Throat:     Pharynx: No oropharyngeal exudate.  Eyes:     Pupils: Pupils are equal, round, and reactive to light.  Neck:     Musculoskeletal: Normal range of motion and neck supple.     Thyroid: No thyromegaly.     Vascular: No JVD.     Trachea: No tracheal deviation.  Cardiovascular:     Rate and Rhythm: Normal rate and regular rhythm.     Heart sounds:  Normal heart sounds. No murmur. No friction rub. No gallop.   Pulmonary:     Effort: Pulmonary effort is normal. No respiratory distress.     Breath sounds: Normal breath sounds. No wheezing or rales.  Chest:     Chest wall: No tenderness.  Abdominal:     Palpations: Abdomen is soft.  Tenderness: There is no abdominal tenderness. There is no guarding.  Musculoskeletal: Normal range of motion.  Lymphadenopathy:     Cervical: No cervical adenopathy.  Skin:    General: Skin is warm and dry.  Neurological:     Mental Status: She is alert and oriented to person, place, and time.     Cranial Nerves: No cranial nerve deficit.  Psychiatric:        Behavior: Behavior normal.        Thought Content: Thought content normal.        Judgment: Judgment normal.    Assessment/Plan: 1. Hypertension, unspecified type Refilled patients  - losartan (COZAAR) 25 MG tablet; Take 1 tablet (25 mg total) by mouth daily.  Dispense: 30 tablet; Refill: 0  2. Sarcoidosis of lung (HCC) Stable, continue to use inhalers as directed.    3. Neuropathy Continue Requip at night.   4. Fatigue, unspecified type Continue to monitor at future visits.   General Counseling: Val EagleKathy verbalizes understanding of the findings of todays visit and agrees with plan of treatment. I have discussed any further diagnostic evaluation that may be needed or ordered today. We also reviewed her medications today. she has been encouraged to call the office with any questions or concerns that should arise related to todays visit.    No orders of the defined types were placed in this encounter.   No orders of the defined types were placed in this encounter.   Time spent: 25 Minutes    This patient was seen by Blima LedgerAdam Carrell Rahmani AGNP-C in Collaboration with Dr Lyndon CodeFozia M Khan as a part of collaborative care agreement     Johnna AcostaAdam J. Tarisha Fader AGNP-C Internal medicine

## 2019-01-22 ENCOUNTER — Encounter: Payer: Self-pay | Admitting: Adult Health

## 2019-01-22 ENCOUNTER — Ambulatory Visit: Payer: 59 | Admitting: Adult Health

## 2019-01-22 ENCOUNTER — Other Ambulatory Visit: Payer: Self-pay

## 2019-01-22 VITALS — BP 147/95 | HR 100 | Resp 16 | Ht 67.0 in | Wt 170.0 lb

## 2019-01-22 DIAGNOSIS — I10 Essential (primary) hypertension: Secondary | ICD-10-CM | POA: Diagnosis not present

## 2019-01-22 DIAGNOSIS — G4700 Insomnia, unspecified: Secondary | ICD-10-CM

## 2019-01-22 MED ORDER — ZOLPIDEM TARTRATE 10 MG PO TABS: 10.0000 mg | ORAL_TABLET | Freq: Every evening | ORAL | 0 refills | Status: DC | PRN

## 2019-01-22 MED ORDER — LOSARTAN POTASSIUM 50 MG PO TABS: 50.0000 mg | ORAL_TABLET | Freq: Every day | ORAL | 1 refills | Status: DC

## 2019-01-22 NOTE — Progress Notes (Signed)
[facility] [address] [place], [place] [address]  Internal MEDICINE  Office Visit Note  Patient Name: Brittany Huynh  [id number]  [id number]  Date of Service: 01/22/2019  Chief Complaint  Patient presents with  . Follow-up    bp has been fluctuating  . Hypertension  . Insomnia  . Ear Pain    sharp pain    HPI  Pt is here for follow up on HTN and insomnia.  She is also reporting ear pain/headache. She reports her blood pressure has been fluctuating, and she is getting increased readings which usually corollate with her headaches.    Current Medication: Outpatient Encounter Medications as of 01/22/2019  Medication Sig  . albuterol (PROVENTIL HFA;VENTOLIN HFA) 108 (90 Base) MCG/ACT inhaler Inhale 2 puffs into the lungs every 6 (six) hours as needed for wheezing or shortness of breath.  Marland Kitchen aspirin-acetaminophen-caffeine (EXCEDRIN MIGRAINE) 250-250-65 MG per tablet Take 1 tablet by mouth every 6 (six) hours as needed for headache.   Marland Kitchen FLUoxetine (PROZAC) 20 MG tablet Take 1 tablet (20 mg total) by mouth daily.  Marland Kitchen losartan (COZAAR) 25 MG tablet Take 1 tablet (25 mg total) by mouth daily.  . mirtazapine (REMERON) 30 MG tablet Take 1 tablet (30 mg total) by mouth at bedtime.  . predniSONE (DELTASONE) 1 MG tablet Take 2  Tablets daily  . predniSONE (DELTASONE) 10 MG tablet Take 1 tablet (10 mg total) by mouth daily.  Marland Kitchen rOPINIRole (REQUIP) 0.25 MG tablet Take 1 tablet (0.25 mg total) by mouth at bedtime.   No facility-administered encounter medications on file as of 01/22/2019.     Surgical History: Past Surgical History:  Procedure Laterality Date  . ABDOMINAL HYSTERECTOMY  2009  . BREAST BIOPSY Left 02/15/2018   PREDOMINANTLY MATURE ADIPOSE TISSUE WITH VERY FOCAL AREAS OF BENIGN   . chamberlain procedure   2011  . IRRIGATION AND DEBRIDEMENT HEMATOMA Left 03/07/2018   Procedure: IRRIGATION AND DEBRIDEMENT HEMATOMA-LEFT BREAST;  Surgeon: Earline Mayotte, MD;   Location: [facility] ORS;  Service: General;  Laterality: Left;  . LUNG BIOPSY  2011  . RESECTION RIBS EXTRAPLEURAL  [id number]    Medical History: Past Medical History:  Diagnosis Date  . Anxiety   . Asthma   . Breast fibrocystic disorder 2013  . Hernia 2011   chest wall  . Lung mass 2011  . Sarcoidosis   . Shortness of breath    2011    Family History: Family History  Problem Relation Age of Onset  . Liver cancer Mother 7  . Bone cancer Maternal Aunt 60  . Breast cancer Maternal Aunt   . Breast cancer Paternal Aunt   . Bone cancer Paternal Aunt 65    Social History   Socioeconomic History  . Marital status: Single    Spouse name: Not on file  . Number of children: Not on file  . Years of education: Not on file  . Highest education level: Not on file  Occupational History  . Not on file  Social Needs  . Financial resource strain: Not on file  . Food insecurity:    Worry: Not on file    Inability: Not on file  . Transportation needs:    Medical: Not on file    Non-medical: Not on file  Tobacco Use  . Smoking status: Never Smoker  . Smokeless tobacco: Never Used  Substance and Sexual Activity  . Alcohol use: Yes    Comment: on occassion  .  Drug use: No  . Sexual activity: Not on file  Lifestyle  . Physical activity:    Days per week: Not on file    Minutes per session: Not on file  . Stress: Not on file  Relationships  . Social connections:    Talks on phone: Not on file    Gets together: Not on file    Attends religious service: Not on file    Active member of club or organization: Not on file    Attends meetings of clubs or organizations: Not on file    Relationship status: Not on file  . Intimate partner violence:    Fear of current or ex partner: Not on file    Emotionally abused: Not on file    Physically abused: Not on file    Forced sexual activity: Not on file  Other Topics Concern  . Not on file  Social History Narrative  . Not on file       Review of Systems  Constitutional: Negative for chills, fatigue and unexpected weight change.  HENT: Positive for ear pain. Negative for congestion, rhinorrhea, sneezing and sore throat.   Eyes: Negative for photophobia, pain and redness.  Respiratory: Negative for cough, chest tightness and shortness of breath.   Cardiovascular: Negative for chest pain and palpitations.  Gastrointestinal: Negative for abdominal pain, constipation, diarrhea, nausea and vomiting.  Endocrine: Negative.   Genitourinary: Negative for dysuria and frequency.  Musculoskeletal: Negative for arthralgias, back pain, joint swelling and neck pain.  Skin: Negative for rash.  Allergic/Immunologic: Negative.   Neurological: Negative for tremors and numbness.  Hematological: Negative for adenopathy. Does not bruise/bleed easily.  Psychiatric/Behavioral: Negative for behavioral problems and sleep disturbance. The patient is not nervous/anxious.     Vital Signs: BP (!) 147/95   Pulse 100   Resp 16   Ht 5\' 7"  (1.702 m)   Wt 170 lb (77.1 kg)   SpO2 97%   BMI 26.63 kg/m    Physical Exam HENT:     Right Ear: Hearing, tympanic membrane, ear canal and external ear normal.     Left Ear: Hearing, tympanic membrane, ear canal and external ear normal.    Assessment/Plan: 1. Hypertension, unspecified type Take Losartan as prescribed.  Increased to 50mg .   2. Insomnia, unspecified type Take ambien as directed for sleep.   General Counseling: hager stayner understanding of the findings of todays visit and agrees with plan of treatment. I have discussed any further diagnostic evaluation that may be needed or ordered today. We also reviewed her medications today. she has been encouraged to call the office with any questions or concerns that should arise related to todays visit.    No orders of the defined types were placed in this encounter.   No orders of the defined types were placed in this  encounter.   Time spent: 25 Minutes   This patient was seen by Blima Ledger AGNP-C in Collaboration with Dr Lyndon Code as a part of collaborative care agreement     [name] AGNP-C Internal medicine

## 2019-01-30 ENCOUNTER — Telehealth: Payer: Self-pay

## 2019-01-31 ENCOUNTER — Other Ambulatory Visit: Payer: Self-pay

## 2019-01-31 ENCOUNTER — Other Ambulatory Visit: Payer: Self-pay | Admitting: Adult Health

## 2019-01-31 MED ORDER — IRBESARTAN 75 MG PO TABS: 75.0000 mg | ORAL_TABLET | Freq: Every day | ORAL | 3 refills | Status: DC

## 2019-01-31 NOTE — Telephone Encounter (Signed)
Pt advised we change losartan to irbesartan 75 and send to phar

## 2019-01-31 NOTE — Progress Notes (Signed)
Losartan on backorder, replacing with irbesartan at this time.  RX sent to pharmacy

## 2019-03-04 ENCOUNTER — Ambulatory Visit: Payer: 59 | Admitting: Adult Health

## 2019-03-04 ENCOUNTER — Other Ambulatory Visit: Payer: Self-pay

## 2019-03-04 ENCOUNTER — Encounter: Payer: Self-pay | Admitting: Adult Health

## 2019-03-04 VITALS — BP 125/92 | HR 93 | Resp 16 | Ht 67.0 in | Wt 162.0 lb

## 2019-03-04 DIAGNOSIS — I10 Essential (primary) hypertension: Secondary | ICD-10-CM

## 2019-03-04 DIAGNOSIS — D860 Sarcoidosis of lung: Secondary | ICD-10-CM

## 2019-03-04 DIAGNOSIS — G4700 Insomnia, unspecified: Secondary | ICD-10-CM | POA: Diagnosis not present

## 2019-03-04 MED ORDER — ZOLPIDEM TARTRATE 10 MG PO TABS: 10.0000 mg | ORAL_TABLET | Freq: Every evening | ORAL | 1 refills | Status: DC | PRN

## 2019-03-04 MED ORDER — PREDNISONE 10 MG PO TABS: 10.0000 mg | ORAL_TABLET | Freq: Every day | ORAL | 5 refills | Status: DC

## 2019-03-04 MED ORDER — PREDNISONE 1 MG PO TABS: ORAL_TABLET | ORAL | 3 refills | Status: DC

## 2019-03-04 NOTE — Progress Notes (Signed)
[facility] [address] [place], [place] [id number]  Internal MEDICINE  Office Visit Note  Patient Name: Brittany Huynh  [id number]  [id number]  Date of Service: 03/05/2019  Chief Complaint  Patient presents with  . Medical Management of Chronic Issues  . Hypertension    6 week follow up     HPI  Pt is here for follow up on htn. Her bp is 125/92 today which is some systolic improvement since last visit. She continues to Denies Chest pain, Shortness of breath, palpitations, headache, or blurred vision.       Current Medication: Outpatient Encounter Medications as of 03/04/2019  Medication Sig  . albuterol (PROVENTIL HFA;VENTOLIN HFA) 108 (90 Base) MCG/ACT inhaler Inhale 2 puffs into the lungs every 6 (six) hours as needed for wheezing or shortness of breath.  Marland Kitchen. aspirin-acetaminophen-caffeine (EXCEDRIN MIGRAINE) 250-250-65 MG per tablet Take 1 tablet by mouth every 6 (six) hours as needed for headache.   Marland Kitchen. FLUoxetine (PROZAC) 20 MG tablet Take 1 tablet (20 mg total) by mouth daily.  . irbesartan (AVAPRO) 75 MG tablet Take 1 tablet (75 mg total) by mouth daily.  . mirtazapine (REMERON) 30 MG tablet Take 1 tablet (30 mg total) by mouth at bedtime.  . predniSONE (DELTASONE) 1 MG tablet Take 2  Tablets daily  . predniSONE (DELTASONE) 10 MG tablet Take 1 tablet (10 mg total) by mouth daily.  Marland Kitchen. rOPINIRole (REQUIP) 0.25 MG tablet Take 1 tablet (0.25 mg total) by mouth at bedtime.  . [DISCONTINUED] predniSONE (DELTASONE) 1 MG tablet Take 2  Tablets daily  . [DISCONTINUED] predniSONE (DELTASONE) 10 MG tablet Take 1 tablet (10 mg total) by mouth daily.  Marland Kitchen. losartan (COZAAR) 50 MG tablet Take 1 tablet (50 mg total) by mouth daily. (Patient not taking: Reported on 01/31/2019)  . zolpidem (AMBIEN) 10 MG tablet Take 1 tablet (10 mg total) by mouth at bedtime as needed for sleep.  . [DISCONTINUED] zolpidem (AMBIEN) 10 MG tablet Take 1 tablet (10 mg total) by mouth at bedtime as needed for  up to 30 days for sleep.   No facility-administered encounter medications on file as of 03/04/2019.     Surgical History: Past Surgical History:  Procedure Laterality Date  . ABDOMINAL HYSTERECTOMY  2009  . BREAST BIOPSY Left 02/15/2018   PREDOMINANTLY MATURE ADIPOSE TISSUE WITH VERY FOCAL AREAS OF BENIGN   . chamberlain procedure   2011  . IRRIGATION AND DEBRIDEMENT HEMATOMA Left 03/07/2018   Procedure: IRRIGATION AND DEBRIDEMENT HEMATOMA-LEFT BREAST;  Surgeon: Earline MayotteByrnett, Jeffrey W, MD;  Location: [facility] ORS;  Service: General;  Laterality: Left;  . LUNG BIOPSY  2011  . RESECTION RIBS EXTRAPLEURAL  [id number]    Medical History: Past Medical History:  Diagnosis Date  . Anxiety   . Asthma   . Breast fibrocystic disorder 2013  . Hernia 2011   chest wall  . Lung mass 2011  . Sarcoidosis   . Shortness of breath    2011    Family History: Family History  Problem Relation Age of Onset  . Liver cancer Mother [id number]  . Bone cancer Maternal Aunt 60  . Breast cancer Maternal Aunt   . Breast cancer Paternal Aunt   . Bone cancer Paternal Aunt 4147    Social History   Socioeconomic History  . Marital status: Single    Spouse name: Not on file  . Number of children: Not on file  . Years of education: Not on file  .  Highest education level: Not on file  Occupational History  . Not on file  Social Needs  . Financial resource strain: Not on file  . Food insecurity    Worry: Not on file    Inability: Not on file  . Transportation needs    Medical: Not on file    Non-medical: Not on file  Tobacco Use  . Smoking status: Never Smoker  . Smokeless tobacco: Never Used  Substance and Sexual Activity  . Alcohol use: Yes    Comment: on occassion  . Drug use: No  . Sexual activity: Not on file  Lifestyle  . Physical activity    Days per week: Not on file    Minutes per session: Not on file  . Stress: Not on file  Relationships  . Social Herbalist on phone: Not on file     Gets together: Not on file    Attends religious service: Not on file    Active member of club or organization: Not on file    Attends meetings of clubs or organizations: Not on file    Relationship status: Not on file  . Intimate partner violence    Fear of current or ex partner: Not on file    Emotionally abused: Not on file    Physically abused: Not on file    Forced sexual activity: Not on file  Other Topics Concern  . Not on file  Social History Narrative  . Not on file      Review of Systems  Constitutional: Negative for chills, fatigue and unexpected weight change.  HENT: Negative for congestion, rhinorrhea, sneezing and sore throat.   Eyes: Negative for photophobia, pain and redness.  Respiratory: Negative for cough, chest tightness and shortness of breath.   Cardiovascular: Negative for chest pain and palpitations.  Gastrointestinal: Negative for abdominal pain, constipation, diarrhea, nausea and vomiting.  Endocrine: Negative.   Genitourinary: Negative for dysuria and frequency.  Musculoskeletal: Negative for arthralgias, back pain, joint swelling and neck pain.  Skin: Negative for rash.  Allergic/Immunologic: Negative.   Neurological: Negative for tremors and numbness.  Hematological: Negative for adenopathy. Does not bruise/bleed easily.  Psychiatric/Behavioral: Negative for behavioral problems and sleep disturbance. The patient is not nervous/anxious.     Vital Signs: BP (!) 125/92   Pulse 93   Resp 16   Ht 5\' 7"  (1.702 m)   Wt 162 lb (73.5 kg)   SpO2 99%   BMI 25.37 kg/m    Physical Exam Vitals signs and nursing note reviewed.  Constitutional:      General: She is not in acute distress.    Appearance: She is well-developed. She is not diaphoretic.  HENT:     Head: Normocephalic and atraumatic.     Mouth/Throat:     Pharynx: No oropharyngeal exudate.  Eyes:     Pupils: Pupils are equal, round, and reactive to light.  Neck:     Musculoskeletal:  Normal range of motion and neck supple.     Thyroid: No thyromegaly.     Vascular: No JVD.     Trachea: No tracheal deviation.  Cardiovascular:     Rate and Rhythm: Normal rate and regular rhythm.     Heart sounds: Normal heart sounds. No murmur. No friction rub. No gallop.   Pulmonary:     Effort: Pulmonary effort is normal. No respiratory distress.     Breath sounds: Normal breath sounds. No wheezing or rales.  Chest:  Chest wall: No tenderness.  Abdominal:     Palpations: Abdomen is soft.     Tenderness: There is no abdominal tenderness. There is no guarding.  Musculoskeletal: Normal range of motion.  Lymphadenopathy:     Cervical: No cervical adenopathy.  Skin:    General: Skin is warm and dry.  Neurological:     Mental Status: She is alert and oriented to person, place, and time.     Cranial Nerves: No cranial nerve deficit.  Psychiatric:        Behavior: Behavior normal.        Thought Content: Thought content normal.        Judgment: Judgment normal.     Increase ibersartan from 20mg  to 40mg .    Assessment/Plan: 1. Hypertension, unspecified type Increase Ibersartan to 40mg  daily. Continue to check bp at home. Call office with BP results in 7-10 days and will adjust medication at  That time.   2. Insomnia, unspecified type Reviewed risks and possible side effects associated with taking opiates, benzodiazepines and other CNS depressants. Combination of these could cause dizziness and drowsiness. Advised patient not to drive or operate machinery when taking these medications, as patient's and other's life can be at risk and will have consequences. Patient verbalized understanding in this matter. Dependence and abuse for these drugs will be monitored closely. A Controlled substance policy and procedure is on file which allows [facility] to order a urine drug screen test at any visit. Patient understands and agrees with the plan - zolpidem (AMBIEN) 10 MG  tablet; Take 1 tablet (10 mg total) by mouth at bedtime as needed for sleep.  Dispense: 30 tablet; Refill: 1  3. Sarcoidosis of lung (HCC) Refilled patients prednisone at this time.  - predniSONE (DELTASONE) 1 MG tablet; Take 2  Tablets daily  Dispense: 60 tablet; Refill: 3 - predniSONE (DELTASONE) 10 MG tablet; Take 1 tablet (10 mg total) by mouth daily.  Dispense: 30 tablet; Refill: 5  General Counseling: Chassidy verbalizes understanding of the findings of todays visit and agrees with plan of treatment. I have discussed any further diagnostic evaluation that may be needed or ordered today. We also reviewed her medications today. she has been encouraged to call the office with any questions or concerns that should arise related to todays visit.    No orders of the defined types were placed in this encounter.   Meds ordered this encounter  Medications  . zolpidem (AMBIEN) 10 MG tablet    Sig: Take 1 tablet (10 mg total) by mouth at bedtime as needed for sleep.    Dispense:  30 tablet    Refill:  1  . predniSONE (DELTASONE) 1 MG tablet    Sig: Take 2  Tablets daily    Dispense:  60 tablet    Refill:  3  . predniSONE (DELTASONE) 10 MG tablet    Sig: Take 1 tablet (10 mg total) by mouth daily.    Dispense:  30 tablet    Refill:  5    Time spent:20 Minutes   This patient was seen by 9-10 AGNP-C in Collaboration with Dr Monte Gil as a part of collaborative care agreement     Olegario Messier AGNP-C Internal medicine

## 2019-03-08 ENCOUNTER — Other Ambulatory Visit
Admission: RE | Admit: 2019-03-08 | Discharge: 2019-03-08 | Disposition: A | Discharge status: Home / Self Care | Payer: 59 | Source: Ambulatory Visit | Attending: Adult Health | Admitting: Adult Health

## 2019-03-08 ENCOUNTER — Other Ambulatory Visit: Payer: Self-pay

## 2019-03-08 DIAGNOSIS — R5383 Other fatigue: Secondary | ICD-10-CM | POA: Insufficient documentation

## 2019-03-08 LAB — TSH: TSH: 0.582 u[IU]/mL (ref 0.350–4.500)

## 2019-03-08 LAB — CBC WITH DIFFERENTIAL/PLATELET
Abs Immature Granulocytes: 0.03 10*3/uL (ref 0.00–0.07)
Basophils Absolute: 0 10*3/uL (ref 0.0–0.1)
Basophils Relative: 0 %
Eosinophils Absolute: 0.1 10*3/uL (ref 0.0–0.5)
Eosinophils Relative: 1 %
HCT: 40.3 % (ref 36.0–46.0)
Hemoglobin: 12.7 g/dL (ref 12.0–15.0)
Immature Granulocytes: 0 %
Lymphocytes Relative: 15 %
Lymphs Abs: 1.5 10*3/uL (ref 0.7–4.0)
MCH: 27 pg (ref 26.0–34.0)
MCHC: 31.5 g/dL (ref 30.0–36.0)
MCV: 85.7 fL (ref 80.0–100.0)
Monocytes Absolute: 0.3 10*3/uL (ref 0.1–1.0)
Monocytes Relative: 3 %
Neutro Abs: 8.4 10*3/uL — ABNORMAL HIGH (ref 1.7–7.7)
Neutrophils Relative %: 81 %
Platelets: 216 10*3/uL (ref 150–400)
RBC: 4.7 MIL/uL (ref 3.87–5.11)
RDW: 14.3 % (ref 11.5–15.5)
WBC: 10.4 10*3/uL (ref 4.0–10.5)
nRBC: 0 % (ref 0.0–0.2)

## 2019-03-08 LAB — LIPID PANEL
Cholesterol: 217 mg/dL — ABNORMAL HIGH (ref 0–200)
HDL: 52 mg/dL (ref >40)
LDL Cholesterol: 136 mg/dL — ABNORMAL HIGH (ref 0–99)
Total CHOL/HDL Ratio: 4.2 RATIO
Triglycerides: 145 mg/dL (ref <150)
VLDL: 29 mg/dL (ref 0–40)

## 2019-03-08 LAB — PROTIME-INR
INR: 1 (ref 0.8–1.2)
Prothrombin Time: 13 seconds (ref 11.4–15.2)

## 2019-03-08 LAB — COMPREHENSIVE METABOLIC PANEL
ALT: 14 U/L (ref 0–44)
AST: 20 U/L (ref 15–41)
Albumin: 3.8 g/dL (ref 3.5–5.0)
Alkaline Phosphatase: 87 U/L (ref 38–126)
Anion gap: 9 (ref 5–15)
BUN: 14 mg/dL (ref 6–20)
CO2: 25 mmol/L (ref 22–32)
Calcium: 8.9 mg/dL (ref 8.9–10.3)
Chloride: 105 mmol/L (ref 98–111)
Creatinine, Ser: 0.77 mg/dL (ref 0.44–1.00)
GFR calc Af Amer: >60 mL/min (ref >60)
GFR calc non Af Amer: >60 mL/min (ref >60)
Glucose, Bld: 103 mg/dL — ABNORMAL HIGH (ref 70–99)
Potassium: 3.5 mmol/L (ref 3.5–5.1)
Sodium: 139 mmol/L (ref 135–145)
Total Bilirubin: 0.6 mg/dL (ref 0.3–1.2)
Total Protein: 7 g/dL (ref 6.5–8.1)

## 2019-03-08 LAB — T4, FREE: Free T4: 0.85 ng/dL (ref 0.61–1.12)

## 2019-03-08 LAB — VITAMIN B12: Vitamin B-12: 321 pg/mL (ref 180–914)

## 2019-03-08 LAB — FOLATE: Folate: 10.1 ng/mL (ref >5.9)

## 2019-03-09 LAB — VITAMIN D 25 HYDROXY (VIT D DEFICIENCY, FRACTURES): Vit D, 25-Hydroxy: 18 ng/mL — ABNORMAL LOW (ref 30.0–100.0)

## 2019-03-17 ENCOUNTER — Emergency Department: Payer: 59

## 2019-03-17 ENCOUNTER — Other Ambulatory Visit: Payer: Self-pay

## 2019-03-17 ENCOUNTER — Emergency Department
Admission: EM | Admit: 2019-03-17 | Discharge: 2019-03-17 | Disposition: A | Discharge status: Home / Self Care | Payer: 59 | Attending: Emergency Medicine | Admitting: Emergency Medicine

## 2019-03-17 ENCOUNTER — Encounter: Payer: Self-pay | Admitting: Emergency Medicine

## 2019-03-17 DIAGNOSIS — M545 Low back pain, unspecified: Secondary | ICD-10-CM

## 2019-03-17 DIAGNOSIS — J45909 Unspecified asthma, uncomplicated: Secondary | ICD-10-CM | POA: Diagnosis not present

## 2019-03-17 DIAGNOSIS — R0789 Other chest pain: Secondary | ICD-10-CM | POA: Diagnosis not present

## 2019-03-17 DIAGNOSIS — Z79899 Other long term (current) drug therapy: Secondary | ICD-10-CM | POA: Insufficient documentation

## 2019-03-17 DIAGNOSIS — R079 Chest pain, unspecified: Secondary | ICD-10-CM | POA: Diagnosis not present

## 2019-03-17 LAB — CBC
HCT: 46.3 % — ABNORMAL HIGH (ref 36.0–46.0)
Hemoglobin: 14.6 g/dL (ref 12.0–15.0)
MCH: 27.2 pg (ref 26.0–34.0)
MCHC: 31.5 g/dL (ref 30.0–36.0)
MCV: 86.2 fL (ref 80.0–100.0)
Platelets: 247 10*3/uL (ref 150–400)
RBC: 5.37 MIL/uL — ABNORMAL HIGH (ref 3.87–5.11)
RDW: 14 % (ref 11.5–15.5)
WBC: 6.9 10*3/uL (ref 4.0–10.5)
nRBC: 0 % (ref 0.0–0.2)

## 2019-03-17 LAB — URINALYSIS, ROUTINE W REFLEX MICROSCOPIC
Bilirubin Urine: NEGATIVE
Glucose, UA: NEGATIVE mg/dL
Hgb urine dipstick: NEGATIVE
Ketones, ur: NEGATIVE mg/dL
Leukocytes,Ua: NEGATIVE
Nitrite: NEGATIVE
Protein, ur: 30 mg/dL — AB
Specific Gravity, Urine: 1.018 (ref 1.005–1.030)
pH: 6 (ref 5.0–8.0)

## 2019-03-17 LAB — BASIC METABOLIC PANEL
Anion gap: 10 (ref 5–15)
BUN: 8 mg/dL (ref 6–20)
CO2: 24 mmol/L (ref 22–32)
Calcium: 9.2 mg/dL (ref 8.9–10.3)
Chloride: 104 mmol/L (ref 98–111)
Creatinine, Ser: 0.82 mg/dL (ref 0.44–1.00)
GFR calc Af Amer: >60 mL/min (ref >60)
GFR calc non Af Amer: >60 mL/min (ref >60)
Glucose, Bld: 128 mg/dL — ABNORMAL HIGH (ref 70–99)
Potassium: 3 mmol/L — ABNORMAL LOW (ref 3.5–5.1)
Sodium: 138 mmol/L (ref 135–145)

## 2019-03-17 LAB — TROPONIN I (HIGH SENSITIVITY)
Troponin I (High Sensitivity): 3 ng/L (ref <18)
Troponin I (High Sensitivity): <2 ng/L (ref <18)

## 2019-03-17 MED ORDER — KETOROLAC TROMETHAMINE 30 MG/ML IJ SOLN
15.0000 mg | Freq: Once | INTRAMUSCULAR | Status: AC
  Administered 2019-03-17: 15 mg via INTRAVENOUS
  Filled 2019-03-17: qty 1

## 2019-03-17 MED ORDER — KETOROLAC TROMETHAMINE 10 MG PO TABS: 10.0000 mg | ORAL_TABLET | Freq: Four times a day (QID) | ORAL | 0 refills | Status: DC | PRN

## 2019-03-17 MED ORDER — SODIUM CHLORIDE 0.9% FLUSH
3.0000 mL | Freq: Once | INTRAVENOUS | Status: AC
  Administered 2019-03-17: 3 mL via INTRAVENOUS

## 2019-03-17 NOTE — ED Triage Notes (Signed)
Pt presents to ED via POV with c/o chest pain, feeling like her heart is racing, lower back pain, and numbness to bilateral arms. Pt states pain in her chest is intermittently sharp, states radiates up to her neck at this time. Pt states hx of sarcoidosis, took inhaler earlier today. Also c/o SOB.

## 2019-03-17 NOTE — ED Notes (Signed)
ED Provider Funke at bedside. 

## 2019-03-17 NOTE — ED Provider Notes (Signed)
[facility] Emergency Department Provider Note  ____________________________________________   None    (approximate)  I have reviewed the triage vital signs and the nursing notes.   HISTORY  Chief Complaint Chest Pain, Back Pain, and Numbness   HPI Brittany Huynh is a 53 y.o. female who presents to the emergency department for treatment and evaluation of chest pain that started earlier today.  Pain seems to radiate down both arms.  She is also having some pain in her right lower back.  No alleviating measures attempted prior to arrival.  She does not have history of these symptoms in the past.  Patient does have a significant past medical history of sarcoidosis.   Past Medical History:  Diagnosis Date  . Anxiety   . Asthma   . Breast fibrocystic disorder 2013  . Hernia 2011   chest wall  . Lung mass 2011  . Sarcoidosis   . Shortness of breath    2011    Patient Active Problem List   Diagnosis Date Noted  . Asthma 12/06/2018  . Other atopic dermatitis 04/07/2018  . Breast pain, left 02/28/2018  . Essential hypertension 01/14/2018  . Other chest pain 01/14/2018  . Fatigue 01/14/2018  . Polyneuropathic pain 01/14/2018  . Acute vaginitis 01/14/2018  . Unspecified menopausal and perimenopausal disorder 01/14/2018  . Vitamin D deficiency 01/14/2018  . Dysuria 01/14/2018  . History of chickenpox 11/27/2017  . Shingles 11/27/2017  . 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 Shortness of breath 09/15/2017  . Edema 09/15/2017  . Recurrent major depressive disorder (HCC) 09/15/2017  . Migraine 09/15/2017  . Inflammatory polyarthritis (HCC) 09/15/2017  . Myopathy 09/15/2017  . Long term current use of systemic steroids 09/15/2017  . Tachycardia, unspecified 09/15/2017  . Gastro-esophageal reflux disease without esophagitis 09/15/2017  . Bacterial intestinal infection 09/15/2017  . Hypersomnia, unspecified 09/15/2017  . Iron deficiency anemia  09/15/2017  . Neuralgia and neuritis 09/15/2017  . Restless leg syndrome 09/15/2017  . Generalized anxiety disorder 09/15/2017  . Fibrocystic breast disease 04/05/2013  . Screening for breast cancer 04/05/2013  . Posttraumatic hematoma of left breast 04/05/2013    Past Surgical History:  Procedure Laterality Date  . ABDOMINAL HYSTERECTOMY  2009  . BREAST BIOPSY Left 02/15/2018   PREDOMINANTLY MATURE ADIPOSE TISSUE WITH VERY FOCAL AREAS OF BENIGN   . chamberlain procedure   2011  . IRRIGATION AND DEBRIDEMENT HEMATOMA Left 03/07/2018   Procedure: IRRIGATION AND DEBRIDEMENT HEMATOMA-LEFT BREAST;  Surgeon: Earline Mayotte, MD;  Location: [facility] ORS;  Service: General;  Laterality: Left;  . LUNG BIOPSY  2011  . RESECTION RIBS EXTRAPLEURAL  48016    Prior to Admission medications   Medication Sig Start Date End Date Taking? Authorizing Provider  albuterol (PROVENTIL HFA;VENTOLIN HFA) 108 (90 Base) MCG/ACT inhaler Inhale 2 puffs into the lungs every 6 (six) hours as needed for wheezing or shortness of breath. 11/13/18   [name], NP  aspirin-acetaminophen-caffeine (EXCEDRIN MIGRAINE) [id number] MG per tablet Take 1 tablet by mouth every 6 (six) hours as needed for headache.     [provider]  FLUoxetine (PROZAC) 20 MG tablet Take 1 tablet (20 mg total) by mouth daily. 11/13/18   [name], NP  irbesartan (AVAPRO) 75 MG tablet Take 1 tablet (75 mg total) by mouth daily. 01/31/19 05/31/19  [name], NP  ketorolac (TORADOL) 10 MG tablet Take 1 tablet (10 mg total) by mouth every 6 (six) hours as  needed. 03/17/19   Raford Brissett B, FNP  losartan (COZAAR) 50 MG tablet Take 1 tablet (50 mg total) by mouth daily. Patient not taking: Reported on 01/31/2019 01/22/19   Kendell Bane, NP  mirtazapine (REMERON) 30 MG tablet Take 1 tablet (30 mg total) by mouth at bedtime. 11/13/18   Kendell Bane, NP  predniSONE (DELTASONE) 1 MG tablet Take 2  Tablets daily 03/04/19    Kendell Bane, NP  predniSONE (DELTASONE) 10 MG tablet Take 1 tablet (10 mg total) by mouth daily. 03/04/19   Kendell Bane, NP  rOPINIRole (REQUIP) 0.25 MG tablet Take 1 tablet (0.25 mg total) by mouth at bedtime. 12/27/18   Kendell Bane, NP  zolpidem (AMBIEN) 10 MG tablet Take 1 tablet (10 mg total) by mouth at bedtime as needed for sleep. 03/04/19 05/03/19  Kendell Bane, NP    Allergies Onion and Adhesive [tape]  Family History  Problem Relation Age of Onset  . Liver cancer Mother 69  . Bone cancer Maternal Aunt 60  . Breast cancer Maternal Aunt   . Breast cancer Paternal Aunt   . Bone cancer Paternal Aunt 39    Social History Social History   Tobacco Use  . Smoking status: Never Smoker  . Smokeless tobacco: Never Used  Substance Use Topics  . Alcohol use: Yes    Comment: on occassion  . Drug use: No    Review of Systems  Constitutional: No fever/chills. Eyes: No visual changes. ENT: No sore throat. Cardiovascular: Positive for chest pain.  Negative for pleuritic pain.  Negative for palpitations.  Negative for leg pain. Respiratory: Negative for shortness of breath. Gastrointestinal: No abdominal pain.  Negative for nausea, no vomiting.  No diarrhea.  No constipation. Genitourinary: Negative for dysuria. Musculoskeletal: Positive for back pain.  Skin: Negative for rash, lesion, wound. Neurological: Negative for headaches, focal weakness or numbness. ____________________________________________   PHYSICAL EXAM:  VITAL SIGNS: ED Triage Vitals  Enc Vitals Group     BP 03/17/19 1552 (!) 130/102     Pulse Rate 03/17/19 1552 91     Resp 03/17/19 1552 18     Temp 03/17/19 1552 98.7 F (37.1 C)     Temp Source 03/17/19 1552 Oral     SpO2 03/17/19 1552 97 %     Weight 03/17/19 1558 162 lb (73.5 kg)     Height 03/17/19 1558 5\' 7"  (1.702 m)     Head Circumference --      Peak Flow --      Pain Score 03/17/19 1557 6     Pain Loc --      Pain Edu? --       Excl. in [place]? --     Constitutional: Alert and oriented.  Uncomfortable appearing and in no acute distress.  Normal mental status. Eyes: Conjunctivae are normal. PERRL. Head: Atraumatic. Nose: No congestion/rhinnorhea. Mouth/Throat: Mucous membranes are moist.  Oropharynx non-erythematous. Tongue normal in size and color. Neck: No stridor. Carotid bruit appreciated on exam. Hematological/Lymphatic/Immunilogical: No cervical lymphadenopathy. Tender left axillary node. Cardiovascular: Normal rate, regular rhythm. Grossly normal heart sounds.  Good peripheral circulation. Respiratory: Normal respiratory effort.  No retractions. Lungs CTAB. Gastrointestinal: Soft and nontender. No distention. No abdominal bruits. No CVA tenderness. Genitourinary: Exam deferred. Musculoskeletal: No lower extremity tenderness. No edema of extremities. Neurologic:  Normal speech and language. No gross focal neurologic deficits are appreciated. Skin:  Skin is warm, dry and intact. No rash noted. Psychiatric:  Mood and affect are normal. Speech and behavior are normal.  ____________________________________________   LABS (all labs ordered are listed, but only abnormal results are displayed)  Labs Reviewed  BASIC METABOLIC PANEL - Abnormal; Notable for the following components:      Result Value   Potassium 3.0 (*)    Glucose, Bld 128 (*)    All other components within normal limits  CBC - Abnormal; Notable for the following components:   RBC 5.37 (*)    HCT 46.3 (*)    All other components within normal limits  URINALYSIS, ROUTINE W REFLEX MICROSCOPIC - Abnormal; Notable for the following components:   Color, Urine YELLOW (*)    APPearance CLOUDY (*)    Protein, ur 30 (*)    Bacteria, UA RARE (*)    All other components within normal limits  TROPONIN I (HIGH SENSITIVITY)  TROPONIN I (HIGH SENSITIVITY)   ____________________________________________  EKG  ED ECG REPORT I, Finnlee Silvernail, FNP-BC  personally viewed and interpreted this ECG.   Date: 03/17/2019  EKG Time: 1549  Rate: 90  Rhythm: normal EKG, normal sinus rhythm  Axis: normal  Intervals:none  ST&T Change: no ST elevation  ____________________________________________  RADIOLOGY  ED MD interpretation: No acute cardiopulmonary abnormality per radiology.  Official radiology report(s): Dg Chest 2 View  Result Date: 03/17/2019 CLINICAL DATA:  Chest pain EXAM: CHEST - 2 VIEW COMPARISON:  September 14, 2017 FINDINGS: Again noted is a medial right upper lobe pulmonary nodule. There is no pneumothorax. The lungs are otherwise essentially clear. The lungs appear somewhat hyperexpanded which can be seen in patients with COPD. There is no acute osseous abnormality. The heart size is stable. IMPRESSION: No active cardiopulmonary disease. Electronically Signed   By: Katherine Mantlehristopher  Green M.D.   On: 03/17/2019 16:57    ____________________________________________   PROCEDURES  Procedure(s) performed: None  Procedures  Critical Care performed: No  ____________________________________________   INITIAL IMPRESSION / ASSESSMENT AND PLAN / ED COURSE  As part of my medical decision making, I reviewed the following data within the electronic MEDICAL RECORD NUMBER Evaluated by EM attending Dr. Fuller PlanFunke.  53 year old female presenting to the emergency department for treatment and evaluation of chest pain.  While awaiting room assignment, protocols were performed that included a troponin and EKG.  Initial troponin is reassuring as is the EKG.  Second troponin will be drawn and will monitor.  ----------------------------------------- 9:03 PM on 03/17/2019 -----------------------------------------  Patient will be discharged home.  Serial troponins were both negative.  Urinalysis does not indicate acute cystitis.  She was given some Toradol which does seem to have relieved the pain in her chest and has made the pain in her lower back feels  better.  I will submit a prescription for the same to her pharmacy.  She was encouraged to call her primary care provider tomorrow for hospital follow-up visit.  She was encouraged to return to the emergency department at any point if she develops chest pain or other symptoms of concern and is unable to see her primary care right away.       ____________________________________________   FINAL CLINICAL IMPRESSION(S) / ED DIAGNOSES  Final diagnoses:  Acute bilateral low back pain without sciatica  Atypical chest pain     ED Discharge Orders         Ordered    ketorolac (TORADOL) 10 MG tablet  Every 6 hours PRN     03/17/19 2105  Note:  This document was prepared using Dragon voice recognition software and may include unintentional dictation errors.    Chinita Pester, FNP 03/17/19 2111    Concha Se, MD 03/18/19 1126

## 2019-03-17 NOTE — Discharge Instructions (Signed)
Please return to the emergency department immediately if you develop worsening chest pain or new symptoms of concern.  Follow-up with your primary care provider.

## 2019-03-17 NOTE — ED Notes (Signed)
Re[ports pain to mid acw and lower backn right flank area. Denies urinary symptoms. . Repeat trop obtained.

## 2019-04-03 ENCOUNTER — Other Ambulatory Visit: Payer: Self-pay | Admitting: Adult Health

## 2019-04-08 ENCOUNTER — Other Ambulatory Visit: Payer: Self-pay

## 2019-04-08 ENCOUNTER — Ambulatory Visit: Payer: 59 | Admitting: Adult Health

## 2019-04-08 ENCOUNTER — Encounter: Payer: Self-pay | Admitting: Adult Health

## 2019-04-08 VITALS — BP 150/102 | HR 95 | Resp 16 | Ht 67.0 in | Wt 163.0 lb

## 2019-04-08 DIAGNOSIS — I10 Essential (primary) hypertension: Secondary | ICD-10-CM | POA: Diagnosis not present

## 2019-04-08 DIAGNOSIS — E559 Vitamin D deficiency, unspecified: Secondary | ICD-10-CM

## 2019-04-08 DIAGNOSIS — G4700 Insomnia, unspecified: Secondary | ICD-10-CM | POA: Diagnosis not present

## 2019-04-08 DIAGNOSIS — G4719 Other hypersomnia: Secondary | ICD-10-CM

## 2019-04-08 DIAGNOSIS — D860 Sarcoidosis of lung: Secondary | ICD-10-CM

## 2019-04-08 MED ORDER — ZOLPIDEM TARTRATE 10 MG PO TABS: 10.0000 mg | ORAL_TABLET | Freq: Every evening | ORAL | 1 refills | Status: DC | PRN

## 2019-04-08 MED ORDER — PREDNISONE 10 MG PO TABS: 10.0000 mg | ORAL_TABLET | Freq: Every day | ORAL | 5 refills | Status: DC

## 2019-04-08 MED ORDER — PREDNISONE 1 MG PO TABS: ORAL_TABLET | ORAL | 3 refills | Status: DC

## 2019-04-08 MED ORDER — AMLODIPINE BESYLATE 5 MG PO TABS: 5.0000 mg | ORAL_TABLET | Freq: Every day | ORAL | 0 refills | Status: DC

## 2019-04-08 MED ORDER — VITAMIN D (ERGOCALCIFEROL) 1.25 MG (50000 UNIT) PO CAPS: 50000.0000 [IU] | ORAL_CAPSULE | ORAL | 0 refills | Status: DC

## 2019-04-08 NOTE — Progress Notes (Signed)
[facility] [place], [place] [address]  Internal MEDICINE  Office Visit Note  Patient Name: Brittany Huynh  [id number]  [id number]  Date of Service: 04/08/2019  Chief Complaint  Patient presents with  . Medical Management of Chronic Issues    4 week follow up , bp , labs   . Hypertension    HPI Pt is here for follow up on HTN.  Pt blood pressure is still elevated.  Her labs are reviewed with her. Her vitamin D level is low.  Will adjust medications with patient. She continues to complain of intense daytime fatigue.  She feels like she is going to fall asleep while driving. We discussed sleep studies at previous visits, and patient has not been able to get it done yet.          Current Medication: Outpatient Encounter Medications as of 04/08/2019  Medication Sig  . albuterol (PROVENTIL HFA;VENTOLIN HFA) 108 (90 Base) MCG/ACT inhaler Inhale 2 puffs into the lungs every 6 (six) hours as needed for wheezing or shortness of breath.  Marland Kitchen aspirin-acetaminophen-caffeine (EXCEDRIN MIGRAINE) 250-250-65 MG per tablet Take 1 tablet by mouth every 6 (six) hours as needed for headache.   Marland Kitchen FLUoxetine (PROZAC) 20 MG tablet Take 1 tablet (20 mg total) by mouth daily.  . irbesartan (AVAPRO) 75 MG tablet TAKE 1 TABLET BY MOUTH EVERY DAY  . ketorolac (TORADOL) 10 MG tablet Take 1 tablet (10 mg total) by mouth every 6 (six) hours as needed.  . mirtazapine (REMERON) 30 MG tablet Take 1 tablet (30 mg total) by mouth at bedtime.  . predniSONE (DELTASONE) 1 MG tablet Take 2  Tablets daily  . predniSONE (DELTASONE) 10 MG tablet Take 1 tablet (10 mg total) by mouth daily.  Marland Kitchen rOPINIRole (REQUIP) 0.25 MG tablet Take 1 tablet (0.25 mg total) by mouth at bedtime.  Marland Kitchen zolpidem (AMBIEN) 10 MG tablet Take 1 tablet (10 mg total) by mouth at bedtime as needed for sleep.  Marland Kitchen losartan (COZAAR) 50 MG tablet Take 1 tablet (50 mg total) by mouth daily. (Patient not taking: Reported on 04/08/2019)    No facility-administered encounter medications on file as of 04/08/2019.     Surgical History: Past Surgical History:  Procedure Laterality Date  . ABDOMINAL HYSTERECTOMY  2009  . BREAST BIOPSY Left 02/15/2018   PREDOMINANTLY MATURE ADIPOSE TISSUE WITH VERY FOCAL AREAS OF BENIGN   . chamberlain procedure   2011  . IRRIGATION AND DEBRIDEMENT HEMATOMA Left 03/07/2018   Procedure: IRRIGATION AND DEBRIDEMENT HEMATOMA-LEFT BREAST;  Surgeon: Robert Bellow, MD;  Location: [facility] ORS;  Service: General;  Laterality: Left;  . LUNG BIOPSY  2011  . RESECTION RIBS EXTRAPLEURAL  24401    Medical History: Past Medical History:  Diagnosis Date  . Anxiety   . Asthma   . Breast fibrocystic disorder 2013  . Hernia 2011   chest wall  . Lung mass 2011  . Sarcoidosis   . Shortness of breath    2011    Family History: Family History  Problem Relation Age of Onset  . Liver cancer Mother 55  . Bone cancer Maternal Aunt 60  . Breast cancer Maternal Aunt   . Breast cancer Paternal Aunt   . Bone cancer Paternal Aunt 14    Social History   Socioeconomic History  . Marital status: Single    Spouse name: Not on file  . Number of children: Not on file  . Years of education:  Not on file  . Highest education level: Not on file  Occupational History  . Not on file  Social Needs  . Financial resource strain: Not on file  . Food insecurity    Worry: Not on file    Inability: Not on file  . Transportation needs    Medical: Not on file    Non-medical: Not on file  Tobacco Use  . Smoking status: Never Smoker  . Smokeless tobacco: Never Used  Substance and Sexual Activity  . Alcohol use: Yes    Comment: on occassion  . Drug use: No  . Sexual activity: Not on file  Lifestyle  . Physical activity    Days per week: Not on file    Minutes per session: Not on file  . Stress: Not on file  Relationships  . Social Musician on phone: Not on file    Gets together: Not on file     Attends religious service: Not on file    Active member of club or organization: Not on file    Attends meetings of clubs or organizations: Not on file    Relationship status: Not on file  . Intimate partner violence    Fear of current or ex partner: Not on file    Emotionally abused: Not on file    Physically abused: Not on file    Forced sexual activity: Not on file  Other Topics Concern  . Not on file  Social History Narrative  . Not on file      Review of Systems  Constitutional: Negative for chills, fatigue and unexpected weight change.  HENT: Negative for congestion, rhinorrhea, sneezing and sore throat.   Eyes: Negative for photophobia, pain and redness.  Respiratory: Negative for cough, chest tightness and shortness of breath.   Cardiovascular: Negative for chest pain and palpitations.  Gastrointestinal: Negative for abdominal pain, constipation, diarrhea, nausea and vomiting.  Endocrine: Negative.   Genitourinary: Negative for dysuria and frequency.  Musculoskeletal: Negative for arthralgias, back pain, joint swelling and neck pain.  Skin: Negative for rash.  Allergic/Immunologic: Negative.   Neurological: Negative for tremors and numbness.  Hematological: Negative for adenopathy. Does not bruise/bleed easily.  Psychiatric/Behavioral: Negative for behavioral problems and sleep disturbance. The patient is not nervous/anxious.     Vital Signs: BP (!) 150/102   Pulse 95   Resp 16   Ht 5\' 7"  (1.702 m)   Wt 163 lb (73.9 kg)   SpO2 98%   BMI 25.53 kg/m    Physical Exam Vitals signs and nursing note reviewed.  Constitutional:      General: She is not in acute distress.    Appearance: She is well-developed. She is not diaphoretic.  HENT:     Head: Normocephalic and atraumatic.     Mouth/Throat:     Pharynx: No oropharyngeal exudate.  Eyes:     Pupils: Pupils are equal, round, and reactive to light.  Neck:     Musculoskeletal: Normal range of motion and  neck supple.     Thyroid: No thyromegaly.     Vascular: No JVD.     Trachea: No tracheal deviation.  Cardiovascular:     Rate and Rhythm: Normal rate and regular rhythm.     Heart sounds: Normal heart sounds. No murmur. No friction rub. No gallop.   Pulmonary:     Effort: Pulmonary effort is normal. No respiratory distress.     Breath sounds: Normal breath sounds. No  wheezing or rales.  Chest:     Chest wall: No tenderness.  Abdominal:     Palpations: Abdomen is soft.     Tenderness: There is no abdominal tenderness. There is no guarding.  Musculoskeletal: Normal range of motion.  Lymphadenopathy:     Cervical: No cervical adenopathy.  Skin:    General: Skin is warm and dry.  Neurological:     Mental Status: She is alert and oriented to person, place, and time.     Cranial Nerves: No cranial nerve deficit.  Psychiatric:        Behavior: Behavior normal.        Thought Content: Thought content normal.        Judgment: Judgment normal.    Assessment/Plan: 1. Hypertension, unspecified type Add amlodipine 5mg  to daily regimen, follow up in office in 3 weeks with bp log.  - amLODipine (NORVASC) 5 MG tablet; Take 1 tablet (5 mg total) by mouth daily.  Dispense: 30 tablet; Refill: 0  2. Insomnia, unspecified type Stable, refilled ambien. - zolpidem (AMBIEN) 10 MG tablet; Take 1 tablet (10 mg total) by mouth at bedtime as needed for sleep.  Dispense: 30 tablet; Refill: 1  3. Sarcoidosis of lung (HCC) Refilled patients Prednisone as directed.   - predniSONE (DELTASONE) 1 MG tablet; Take 2  Tablets daily  Dispense: 60 tablet; Refill: 3 - predniSONE (DELTASONE) 10 MG tablet; Take 1 tablet (10 mg total) by mouth daily.  Dispense: 30 tablet; Refill: 5  4. Excessive daytime sleepiness Home sleep study ordered.  - Home sleep test  5. Vitamin D deficiency - Vitamin D, Ergocalciferol, (DRISDOL) 1.25 MG (50000 UT) CAPS capsule; Take 1 capsule (50,000 Units total) by mouth every 7  (seven) days.  Dispense: 8 capsule; Refill: 0  General Counseling: Garcelle verbalizes understanding of the findings of todays visit and agrees with plan of treatment. I have discussed any further diagnostic evaluation that may be needed or ordered today. We also reviewed her medications today. she has been encouraged to call the office with any questions or concerns that should arise related to todays visit.    No orders of the defined types were placed in this encounter.   No orders of the defined types were placed in this encounter.   Time spent: 15 Minutes   This patient was seen by Blima Ledger AGNP-C in Collaboration with Dr Lyndon Code as a part of collaborative care agreement     [name] AGNP-C Internal medicine

## 2019-04-17 ENCOUNTER — Other Ambulatory Visit: Payer: 59 | Admitting: Internal Medicine

## 2019-04-23 ENCOUNTER — Ambulatory Visit: Payer: 59 | Admitting: Internal Medicine

## 2019-05-09 ENCOUNTER — Ambulatory Visit: Payer: 59 | Admitting: Adult Health

## 2019-05-09 ENCOUNTER — Other Ambulatory Visit: Payer: Self-pay | Admitting: Adult Health

## 2019-05-09 ENCOUNTER — Other Ambulatory Visit: Payer: Self-pay

## 2019-05-09 ENCOUNTER — Encounter: Payer: Self-pay | Admitting: Adult Health

## 2019-05-09 VITALS — BP 123/85 | HR 109 | Ht 66.5 in | Wt 155.0 lb

## 2019-05-09 DIAGNOSIS — Z20828 Contact with and (suspected) exposure to other viral communicable diseases: Secondary | ICD-10-CM | POA: Diagnosis not present

## 2019-05-09 DIAGNOSIS — M5440 Lumbago with sciatica, unspecified side: Secondary | ICD-10-CM | POA: Diagnosis not present

## 2019-05-09 DIAGNOSIS — Z20822 Contact with and (suspected) exposure to covid-19: Secondary | ICD-10-CM

## 2019-05-09 DIAGNOSIS — E876 Hypokalemia: Secondary | ICD-10-CM

## 2019-05-09 DIAGNOSIS — F339 Major depressive disorder, recurrent, unspecified: Secondary | ICD-10-CM

## 2019-05-09 NOTE — Progress Notes (Signed)
[facility] [address] [place], [place] [address]  Internal MEDICINE  Telephone Visit  Patient Name: Brittany CannyKathy E Huynh  [id number]  [id number]  Date of Service: 05/09/2019  I connected with the patient at 345 by telephone and verified the patients identity using two identifiers.   I discussed the limitations, risks, security and privacy concerns of performing an evaluation and management service by telephone and the availability of in person appointments. I also discussed with the patient that there may be a patient responsible charge related to the service.  The patient expressed understanding and agrees to proceed.    Chief Complaint  Patient presents with  . Telephone Assessment  . Telephone Screen  . Cough  . Fatigue  . Abdominal Pain  . Diarrhea  . Medical Management of Chronic Issues    leg cramps and numbness in both legs start this morning     HPI  Pt is seen via video for multiple vague complaints.  Her complaints include cough, fatigue, abdominal pain, diarrhea intermittently, and leg cramps numbness in both legs this morning.  Her main complaint is about her legs.  It appears these other complaints coming go intermittently for her.  Discussed COVID testing and she would like to have that done at this time.   Current Medication: Outpatient Encounter Medications as of 05/09/2019  Medication Sig  . albuterol (PROVENTIL HFA;VENTOLIN HFA) 108 (90 Base) MCG/ACT inhaler Inhale 2 puffs into the lungs every 6 (six) hours as needed for wheezing or shortness of breath.  Marland Kitchen. amLODipine (NORVASC) 5 MG tablet Take 1 tablet (5 mg total) by mouth daily.  Marland Kitchen. aspirin-acetaminophen-caffeine (EXCEDRIN MIGRAINE) 250-250-65 MG per tablet Take 1 tablet by mouth every 6 (six) hours as needed for headache.   Marland Kitchen. FLUoxetine (PROZAC) 20 MG tablet Take 1 tablet (20 mg total) by mouth daily.  . irbesartan (AVAPRO) 75 MG tablet TAKE 1 TABLET BY MOUTH EVERY DAY  . ketorolac (TORADOL) 10 MG  tablet Take 1 tablet (10 mg total) by mouth every 6 (six) hours as needed.  Marland Kitchen. losartan (COZAAR) 50 MG tablet Take 1 tablet (50 mg total) by mouth daily.  . mirtazapine (REMERON) 30 MG tablet Take 1 tablet (30 mg total) by mouth at bedtime.  . predniSONE (DELTASONE) 1 MG tablet Take 2  Tablets daily  . predniSONE (DELTASONE) 10 MG tablet Take 1 tablet (10 mg total) by mouth daily.  Marland Kitchen. rOPINIRole (REQUIP) 0.25 MG tablet Take 1 tablet (0.25 mg total) by mouth at bedtime.  . Vitamin D, Ergocalciferol, (DRISDOL) 1.25 MG (50000 UT) CAPS capsule Take 1 capsule (50,000 Units total) by mouth every 7 (seven) days.  Marland Kitchen. zolpidem (AMBIEN) 10 MG tablet Take 1 tablet (10 mg total) by mouth at bedtime as needed for sleep.   No facility-administered encounter medications on file as of 05/09/2019.     Surgical History: Past Surgical History:  Procedure Laterality Date  . ABDOMINAL HYSTERECTOMY  2009  . BREAST BIOPSY Left 02/15/2018   PREDOMINANTLY MATURE ADIPOSE TISSUE WITH VERY FOCAL AREAS OF BENIGN   . chamberlain procedure   2011  . IRRIGATION AND DEBRIDEMENT HEMATOMA Left 03/07/2018   Procedure: IRRIGATION AND DEBRIDEMENT HEMATOMA-LEFT BREAST;  Surgeon: Earline MayotteByrnett, Jeffrey W, MD;  Location: [facility] ORS;  Service: General;  Laterality: Left;  . LUNG BIOPSY  2011  . RESECTION RIBS EXTRAPLEURAL  [id number]    Medical History: Past Medical History:  Diagnosis Date  . Anxiety   . Asthma   . Breast  fibrocystic disorder 2013  . Hernia 2011   chest wall  . Lung mass 2011  . Sarcoidosis   . Shortness of breath    2011    Family History: Family History  Problem Relation Age of Onset  . Liver cancer Mother 5  . Bone cancer Maternal Aunt 60  . Breast cancer Maternal Aunt   . Breast cancer Paternal Aunt   . Bone cancer Paternal Aunt 22    Social History   Socioeconomic History  . Marital status: Single    Spouse name: Not on file  . Number of children: Not on file  . Years of education: Not on file  .  Highest education level: Not on file  Occupational History  . Not on file  Social Needs  . Financial resource strain: Not on file  . Food insecurity    Worry: Not on file    Inability: Not on file  . Transportation needs    Medical: Not on file    Non-medical: Not on file  Tobacco Use  . Smoking status: Never Smoker  . Smokeless tobacco: Never Used  Substance and Sexual Activity  . Alcohol use: Yes    Comment: on occassion  . Drug use: No  . Sexual activity: Not on file  Lifestyle  . Physical activity    Days per week: Not on file    Minutes per session: Not on file  . Stress: Not on file  Relationships  . Social Herbalist on phone: Not on file    Gets together: Not on file    Attends religious service: Not on file    Active member of club or organization: Not on file    Attends meetings of clubs or organizations: Not on file    Relationship status: Not on file  . Intimate partner violence    Fear of current or ex partner: Not on file    Emotionally abused: Not on file    Physically abused: Not on file    Forced sexual activity: Not on file  Other Topics Concern  . Not on file  Social History Narrative  . Not on file      Review of Systems  Constitutional: Positive for fatigue. Negative for chills and unexpected weight change.  HENT: Negative for congestion, rhinorrhea, sneezing and sore throat.   Eyes: Negative for photophobia, pain and redness.  Respiratory: Negative for cough, chest tightness and shortness of breath.   Cardiovascular: Negative for chest pain and palpitations.  Gastrointestinal: Positive for diarrhea. Negative for abdominal pain, constipation, nausea and vomiting.  Endocrine: Negative.   Genitourinary: Negative for dysuria and frequency.  Musculoskeletal: Positive for back pain. Negative for arthralgias, joint swelling and neck pain.  Skin: Negative for rash.  Allergic/Immunologic: Negative.   Neurological: Positive for weakness.  Negative for tremors and numbness.  Hematological: Negative for adenopathy. Does not bruise/bleed easily.  Psychiatric/Behavioral: Negative for behavioral problems and sleep disturbance. The patient is not nervous/anxious.     Vital Signs: BP 123/85   Pulse (!) 109   Ht 5' 6.5" (1.689 m)   Wt 155 lb (70.3 kg)   BMI 24.64 kg/m    Observation/Objective: Pt appears at baseline, NAD noted.    Assessment/Plan:  1. Acute bilateral low back pain with sciatica, sciatica laterality unspecified Upon further investigation patient's leg pain and cramping could be due to lower back pain also.  She is unsure if the pain is radiating from the  back or it is just another pain that she has at this time.  We will do lumbar spine film to check for abnormalities. - DG Lumbar Spine Complete; Future  2. Close Exposure to Covid-19 Virus COVID-19 viral infection testing to performed at this time.  3. Hypokalemia Leg cramps could possibly be due to hypokalemia will get basic metabolic panel to evaluate potassium level.  General Counseling: darian ace understanding of the findings of today's phone visit and agrees with plan of treatment. I have discussed any further diagnostic evaluation that may be needed or ordered today. We also reviewed her medications today. she has been encouraged to call the office with any questions or concerns that should arise related to todays visit.    No orders of the defined types were placed in this encounter.   No orders of the defined types were placed in this encounter.   Time spent: 25 Minutes   Blima Ledger [facility] Internal medicine

## 2019-05-10 ENCOUNTER — Ambulatory Visit
Admission: RE | Admit: 2019-05-10 | Discharge: 2019-05-10 | Disposition: A | Discharge status: Home / Self Care | Payer: 59 | Source: Ambulatory Visit | Attending: Adult Health | Admitting: Adult Health

## 2019-05-10 ENCOUNTER — Other Ambulatory Visit: Payer: Self-pay

## 2019-05-10 ENCOUNTER — Other Ambulatory Visit
Admission: RE | Admit: 2019-05-10 | Discharge: 2019-05-10 | Disposition: A | Discharge status: Home / Self Care | Payer: 59 | Source: Ambulatory Visit | Attending: Adult Health | Admitting: Adult Health

## 2019-05-10 DIAGNOSIS — M5136 Other intervertebral disc degeneration, lumbar region: Secondary | ICD-10-CM | POA: Diagnosis not present

## 2019-05-10 DIAGNOSIS — Z20822 Contact with and (suspected) exposure to covid-19: Secondary | ICD-10-CM

## 2019-05-10 DIAGNOSIS — M5440 Lumbago with sciatica, unspecified side: Secondary | ICD-10-CM | POA: Diagnosis not present

## 2019-05-10 DIAGNOSIS — R6889 Other general symptoms and signs: Secondary | ICD-10-CM | POA: Diagnosis not present

## 2019-05-10 LAB — BASIC METABOLIC PANEL
Anion gap: 8 (ref 5–15)
BUN: 16 mg/dL (ref 6–20)
CO2: 26 mmol/L (ref 22–32)
Calcium: 9 mg/dL (ref 8.9–10.3)
Chloride: 106 mmol/L (ref 98–111)
Creatinine, Ser: 0.94 mg/dL (ref 0.44–1.00)
GFR calc Af Amer: >60 mL/min (ref >60)
GFR calc non Af Amer: >60 mL/min (ref >60)
Glucose, Bld: 104 mg/dL — ABNORMAL HIGH (ref 70–99)
Potassium: 3.7 mmol/L (ref 3.5–5.1)
Sodium: 140 mmol/L (ref 135–145)

## 2019-05-12 LAB — NOVEL CORONAVIRUS, NAA: SARS-CoV-2, NAA: NOT DETECTED

## 2019-05-13 ENCOUNTER — Telehealth: Payer: Self-pay

## 2019-05-13 ENCOUNTER — Other Ambulatory Visit: Payer: Self-pay

## 2019-05-13 MED ORDER — DICYCLOMINE HCL 10 MG PO CAPS: 10.0000 mg | ORAL_CAPSULE | Freq: Every day | ORAL | 0 refills | Status: DC

## 2019-05-13 NOTE — Telephone Encounter (Signed)
Per Dr. Clayborn Bigness, let pt know that her covid test was normal, nothing was detected.

## 2019-05-13 NOTE — Telephone Encounter (Signed)
Called pt to let her know that we sent in bentyl 10 mg po daily for continuing stomachache. Also let pt know per Dr. Clayborn Bigness that her potassium is better and that her xray showed arthritis and pt may need bmd. Schedule pt appt with dfk on 05/27/19 @ 10:30

## 2019-05-23 DIAGNOSIS — W108XXA Fall (on) (from) other stairs and steps, initial encounter: Secondary | ICD-10-CM | POA: Diagnosis not present

## 2019-05-23 DIAGNOSIS — S93601A Unspecified sprain of right foot, initial encounter: Secondary | ICD-10-CM | POA: Diagnosis not present

## 2019-05-23 DIAGNOSIS — M79671 Pain in right foot: Secondary | ICD-10-CM | POA: Diagnosis not present

## 2019-05-23 DIAGNOSIS — S99921A Unspecified injury of right foot, initial encounter: Secondary | ICD-10-CM | POA: Diagnosis not present

## 2019-05-23 DIAGNOSIS — M7989 Other specified soft tissue disorders: Secondary | ICD-10-CM | POA: Diagnosis not present

## 2019-05-27 ENCOUNTER — Encounter: Payer: Self-pay | Admitting: Internal Medicine

## 2019-05-27 ENCOUNTER — Other Ambulatory Visit: Payer: Self-pay

## 2019-05-27 ENCOUNTER — Ambulatory Visit: Payer: 59 | Admitting: Internal Medicine

## 2019-05-27 DIAGNOSIS — E2839 Other primary ovarian failure: Secondary | ICD-10-CM | POA: Diagnosis not present

## 2019-05-27 DIAGNOSIS — M2550 Pain in unspecified joint: Secondary | ICD-10-CM | POA: Diagnosis not present

## 2019-05-27 DIAGNOSIS — I809 Phlebitis and thrombophlebitis of unspecified site: Secondary | ICD-10-CM

## 2019-05-27 DIAGNOSIS — D869 Sarcoidosis, unspecified: Secondary | ICD-10-CM

## 2019-05-27 NOTE — Progress Notes (Signed)
[facility] [place], [place] [address]  Internal MEDICINE  Office Visit Note  Patient Name: Brittany Huynh  [id number]  [id number]  Date of Service: 05/28/2019  Chief Complaint  Patient presents with  . Anxiety  . Follow-up    xray possible need BMD    HPI Pt is here for routine follow up. She has multiple problems, c/o back pain, stiffness, difficulty walking, She has been maintained on prednisone over 6 years now, She is also c/o easy bruising and leg pain as well. Problem sleeping at night. Overall just dont feel well Cough is there off on on, does use her albuterol inhaler, Has h/o sarcoidosis. No recent PFT at this time Pt was unable to have sleep study due to cost issues    Current Medication: Outpatient Encounter Medications as of 05/27/2019  Medication Sig  . albuterol (PROVENTIL HFA;VENTOLIN HFA) 108 (90 Base) MCG/ACT inhaler Inhale 2 puffs into the lungs every 6 (six) hours as needed for wheezing or shortness of breath.  Marland Kitchen amLODipine (NORVASC) 5 MG tablet Take 1 tablet (5 mg total) by mouth daily.  Marland Kitchen aspirin-acetaminophen-caffeine (EXCEDRIN MIGRAINE) 250-250-65 MG per tablet Take 1 tablet by mouth every 6 (six) hours as needed for headache.   . dicyclomine (BENTYL) 10 MG capsule Take 1 capsule (10 mg total) by mouth daily.  Marland Kitchen FLUoxetine (PROZAC) 20 MG tablet Take 1 tablet (20 mg total) by mouth daily.  . irbesartan (AVAPRO) 75 MG tablet TAKE 1 TABLET BY MOUTH EVERY DAY  . ketorolac (TORADOL) 10 MG tablet Take 1 tablet (10 mg total) by mouth every 6 (six) hours as needed.  Marland Kitchen losartan (COZAAR) 50 MG tablet Take 1 tablet (50 mg total) by mouth daily.  . mirtazapine (REMERON) 30 MG tablet TAKE 1 TABLET (30 MG TOTAL) BY MOUTH AT BEDTIME.  . predniSONE (DELTASONE) 1 MG tablet Take 2  Tablets daily  . predniSONE (DELTASONE) 10 MG tablet Take 1 tablet (10 mg total) by mouth daily.  Marland Kitchen rOPINIRole (REQUIP) 0.25 MG tablet Take 1 tablet (0.25 mg total) by  mouth at bedtime.  . Vitamin D, Ergocalciferol, (DRISDOL) 1.25 MG (50000 UT) CAPS capsule Take 1 capsule (50,000 Units total) by mouth every 7 (seven) days.  Marland Kitchen zolpidem (AMBIEN) 10 MG tablet Take 1 tablet (10 mg total) by mouth at bedtime as needed for sleep.   No facility-administered encounter medications on file as of 05/27/2019.     Surgical History: Past Surgical History:  Procedure Laterality Date  . ABDOMINAL HYSTERECTOMY  2009  . BREAST BIOPSY Left 02/15/2018   PREDOMINANTLY MATURE ADIPOSE TISSUE WITH VERY FOCAL AREAS OF BENIGN   . chamberlain procedure   2011  . IRRIGATION AND DEBRIDEMENT HEMATOMA Left 03/07/2018   Procedure: IRRIGATION AND DEBRIDEMENT HEMATOMA-LEFT BREAST;  Surgeon: Robert Bellow, MD;  Location: [facility] ORS;  Service: General;  Laterality: Left;  . LUNG BIOPSY  2011  . RESECTION RIBS EXTRAPLEURAL  [id number]    Medical History: Past Medical History:  Diagnosis Date  . Anxiety   . Asthma   . Breast fibrocystic disorder 2013  . Hernia 2011   chest wall  . Lung mass 2011  . Sarcoidosis   . Shortness of breath    2011    Family History: Family History  Problem Relation Age of Onset  . Liver cancer Mother 28  . Bone cancer Maternal Aunt 60  . Breast cancer Maternal Aunt   . Breast cancer Paternal Aunt   .  Bone cancer Paternal Aunt 5    Social History   Socioeconomic History  . Marital status: Single    Spouse name: Not on file  . Number of children: Not on file  . Years of education: Not on file  . Highest education level: Not on file  Occupational History  . Not on file  Social Needs  . Financial resource strain: Not on file  . Food insecurity    Worry: Not on file    Inability: Not on file  . Transportation needs    Medical: Not on file    Non-medical: Not on file  Tobacco Use  . Smoking status: Never Smoker  . Smokeless tobacco: Never Used  Substance and Sexual Activity  . Alcohol use: Yes    Comment: on occassion  . Drug use:  No  . Sexual activity: Not on file  Lifestyle  . Physical activity    Days per week: Not on file    Minutes per session: Not on file  . Stress: Not on file  Relationships  . Social Herbalist on phone: Not on file    Gets together: Not on file    Attends religious service: Not on file    Active member of club or organization: Not on file    Attends meetings of clubs or organizations: Not on file    Relationship status: Not on file  . Intimate partner violence    Fear of current or ex partner: Not on file    Emotionally abused: Not on file    Physically abused: Not on file    Forced sexual activity: Not on file  Other Topics Concern  . Not on file  Social History Narrative  . Not on file      Review of Systems  Constitutional: Negative for chills, diaphoresis and fatigue.  HENT: Negative for ear pain, postnasal drip and sinus pressure.   Eyes: Negative for photophobia, discharge, redness, itching and visual disturbance.  Respiratory: Negative for cough, shortness of breath and wheezing.   Cardiovascular: Positive for leg swelling. Negative for chest pain and palpitations.  Gastrointestinal: Negative for abdominal pain, constipation, diarrhea, nausea and vomiting.  Genitourinary: Negative for dysuria and flank pain.  Musculoskeletal: Positive for arthralgias and back pain. Negative for gait problem and neck pain.  Skin: Negative for color change.  Allergic/Immunologic: Negative for environmental allergies and food allergies.  Neurological: Negative for dizziness and headaches.  Hematological: Bruises/bleeds easily.  Psychiatric/Behavioral: Negative for agitation, behavioral problems (depression) and hallucinations.    Vital Signs: BP 139/82   Pulse 88   Temp (!) 97.2 F (36.2 C)   Resp 16   Ht 5' 6"  (1.676 m)   Wt 166 lb (75.3 kg)   SpO2 99%   BMI 26.79 kg/m    Physical Exam Constitutional:      General: She is not in acute distress.    Appearance:  She is well-developed. She is not diaphoretic.  HENT:     Head: Normocephalic and atraumatic.     Mouth/Throat:     Pharynx: No oropharyngeal exudate.  Eyes:     Pupils: Pupils are equal, round, and reactive to light.  Neck:     Musculoskeletal: Normal range of motion and neck supple.     Thyroid: No thyromegaly.     Vascular: No JVD.     Trachea: No tracheal deviation.  Cardiovascular:     Rate and Rhythm: Normal rate and regular rhythm.  Heart sounds: Normal heart sounds. No murmur. No friction rub. No gallop.   Pulmonary:     Effort: Pulmonary effort is normal. No respiratory distress.     Breath sounds: No wheezing or rales.  Chest:     Chest wall: No tenderness.  Abdominal:     General: Bowel sounds are normal.     Palpations: Abdomen is soft.  Musculoskeletal: Normal range of motion.  Lymphadenopathy:     Cervical: No cervical adenopathy.  Skin:    General: Skin is warm and dry.     Capillary Refill: Capillary refill takes less than 2 seconds.     Findings: Bruising and erythema present.  Neurological:     Mental Status: She is alert and oriented to person, place, and time.     Cranial Nerves: No cranial nerve deficit.     Gait: Gait abnormal.  Psychiatric:        Behavior: Behavior normal.        Thought Content: Thought content normal.        Judgment: Judgment normal.    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 Arthralgia, unspecified joint - She continues to have problem with gait, stiffness, can be associated with sarcoid, might need to add other therapies  - Sed Rate (ESR) - ANA w/Reflex if Positive; Future - Uric acid - Rheumatoid Factor - CK Total (and CKMB)  3. Sarcoidosis - Samples of symbicort 160 2 puffs bid is given  - Pulmonary Function Test; Future  4. Thrombophlebitis - Leg /calf discored and swelling,  will get a diagnostic study  - VAS [place] LOWER EXTREMITY VENOUS (DVT); Future  General Counseling: anuhea gassner understanding of the findings of todays visit and agrees with plan of treatment. I have discussed any further diagnostic evaluation that may be needed or ordered today. We also reviewed her medications today. she has been encouraged to call the office with any questions or concerns that should arise related to todays visit.    Orders Placed This Encounter  Procedures  . DG Bone Density  . Sed Rate (ESR)  . ANA w/Reflex if Positive  . Uric acid  . Rheumatoid Factor  . CK Total (and CKMB)  . Pulmonary Function Test  . VAS [place] LOWER EXTREMITY VENOUS (DVT)     Time spent:25 Minutes  Dr Lavera Guise Internal medicine

## 2019-05-29 ENCOUNTER — Other Ambulatory Visit
Admission: RE | Admit: 2019-05-29 | Discharge: 2019-05-29 | Disposition: A | Discharge status: Home / Self Care | Payer: 59 | Source: Ambulatory Visit | Attending: Internal Medicine | Admitting: Internal Medicine

## 2019-05-29 DIAGNOSIS — I809 Phlebitis and thrombophlebitis of unspecified site: Secondary | ICD-10-CM | POA: Diagnosis not present

## 2019-05-29 DIAGNOSIS — D869 Sarcoidosis, unspecified: Secondary | ICD-10-CM | POA: Diagnosis not present

## 2019-05-29 DIAGNOSIS — E2839 Other primary ovarian failure: Secondary | ICD-10-CM | POA: Insufficient documentation

## 2019-05-29 DIAGNOSIS — Z79899 Other long term (current) drug therapy: Secondary | ICD-10-CM | POA: Diagnosis not present

## 2019-05-29 DIAGNOSIS — F419 Anxiety disorder, unspecified: Secondary | ICD-10-CM | POA: Diagnosis not present

## 2019-05-29 DIAGNOSIS — M2550 Pain in unspecified joint: Secondary | ICD-10-CM | POA: Diagnosis not present

## 2019-05-29 DIAGNOSIS — R69 Illness, unspecified: Secondary | ICD-10-CM | POA: Diagnosis not present

## 2019-05-29 LAB — SEDIMENTATION RATE: Sed Rate: 37 mm/hr — ABNORMAL HIGH (ref 0–30)

## 2019-05-29 LAB — URIC ACID: Uric Acid, Serum: 4.6 mg/dL (ref 2.5–7.1)

## 2019-05-29 LAB — CKMB (ARMC ONLY): CK, MB: 1.5 ng/mL (ref 0.5–5.0)

## 2019-05-30 LAB — RHEUMATOID FACTOR: Rheumatoid fact SerPl-aCnc: <10 IU/mL (ref 0.0–13.9)

## 2019-05-30 LAB — ANA W/REFLEX IF POSITIVE: Anti Nuclear Antibody (ANA): NEGATIVE

## 2019-05-31 ENCOUNTER — Other Ambulatory Visit: Payer: Self-pay | Admitting: Adult Health

## 2019-05-31 DIAGNOSIS — I10 Essential (primary) hypertension: Secondary | ICD-10-CM

## 2019-05-31 DIAGNOSIS — E559 Vitamin D deficiency, unspecified: Secondary | ICD-10-CM

## 2019-06-05 DIAGNOSIS — Z23 Encounter for immunization: Secondary | ICD-10-CM | POA: Diagnosis not present

## 2019-06-06 ENCOUNTER — Other Ambulatory Visit: Payer: Self-pay | Admitting: Adult Health

## 2019-06-06 ENCOUNTER — Other Ambulatory Visit: Payer: Self-pay

## 2019-06-06 DIAGNOSIS — F339 Major depressive disorder, recurrent, unspecified: Secondary | ICD-10-CM

## 2019-06-06 MED ORDER — MIRTAZAPINE 30 MG PO TABS: 30.0000 mg | ORAL_TABLET | Freq: Every day | ORAL | 0 refills | Status: DC

## 2019-06-12 ENCOUNTER — Other Ambulatory Visit: Payer: Self-pay

## 2019-06-12 ENCOUNTER — Ambulatory Visit: Payer: 59 | Admitting: Internal Medicine

## 2019-06-12 DIAGNOSIS — M8588 Other specified disorders of bone density and structure, other site: Secondary | ICD-10-CM | POA: Diagnosis not present

## 2019-06-12 DIAGNOSIS — D869 Sarcoidosis, unspecified: Secondary | ICD-10-CM

## 2019-06-12 DIAGNOSIS — Z780 Asymptomatic menopausal state: Secondary | ICD-10-CM | POA: Diagnosis not present

## 2019-06-12 DIAGNOSIS — M8589 Other specified disorders of bone density and structure, multiple sites: Secondary | ICD-10-CM | POA: Diagnosis not present

## 2019-06-12 DIAGNOSIS — Z13820 Encounter for screening for osteoporosis: Secondary | ICD-10-CM | POA: Diagnosis not present

## 2019-06-12 DIAGNOSIS — E2839 Other primary ovarian failure: Secondary | ICD-10-CM | POA: Diagnosis not present

## 2019-06-12 DIAGNOSIS — R0602 Shortness of breath: Secondary | ICD-10-CM

## 2019-06-12 LAB — PULMONARY FUNCTION TEST

## 2019-06-14 ENCOUNTER — Ambulatory Visit: Payer: 59

## 2019-06-14 ENCOUNTER — Other Ambulatory Visit: Payer: Self-pay

## 2019-06-14 ENCOUNTER — Other Ambulatory Visit: Payer: Self-pay | Admitting: Adult Health

## 2019-06-14 DIAGNOSIS — I809 Phlebitis and thrombophlebitis of unspecified site: Secondary | ICD-10-CM

## 2019-06-14 DIAGNOSIS — E559 Vitamin D deficiency, unspecified: Secondary | ICD-10-CM

## 2019-06-14 MED ORDER — VITAMIN D (ERGOCALCIFEROL) 1.25 MG (50000 UNIT) PO CAPS: 50000.0000 [IU] | ORAL_CAPSULE | ORAL | 0 refills | Status: DC

## 2019-06-24 ENCOUNTER — Other Ambulatory Visit: Payer: Self-pay | Admitting: Adult Health

## 2019-06-24 MED ORDER — DICYCLOMINE HCL 10 MG PO CAPS: ORAL_CAPSULE | ORAL | 0 refills | Status: DC

## 2019-06-24 NOTE — Procedures (Signed)
[facility] [place], [address]  DATE OF SERVICE: June 12, 2019  Complete Pulmonary Function Testing Interpretation:  FINDINGS:  Forced vital capacity is mildly decreased.  The FEV1 is 1.78 L which is 70% of predicted and is mildly decreased.  FEV1 FVC ratio is mildly decreased.  Total lung capacity is normal residual volume is increased residual total lung capacity ratio is increased.  Thoracic gas volume is normal.  DLCO is mildly decreased.  DLCO corrected for alveolar volume is normal.  IMPRESSION:  This pulmonary function study is suggestive of mild obstructive lung disease.  Allyne Gee, MD [facility] Pulmonary Critical Care Medicine Sleep Medicine

## 2019-06-28 ENCOUNTER — Other Ambulatory Visit: Payer: Self-pay | Admitting: Adult Health

## 2019-06-28 DIAGNOSIS — I10 Essential (primary) hypertension: Secondary | ICD-10-CM

## 2019-07-08 ENCOUNTER — Other Ambulatory Visit: Payer: Self-pay

## 2019-07-08 ENCOUNTER — Encounter: Payer: Self-pay | Admitting: Internal Medicine

## 2019-07-08 ENCOUNTER — Ambulatory Visit: Payer: 59 | Admitting: Internal Medicine

## 2019-07-08 VITALS — BP 120/88 | HR 92 | Temp 97.3°F | Resp 16 | Ht 66.0 in | Wt 167.0 lb

## 2019-07-08 DIAGNOSIS — G4701 Insomnia due to medical condition: Secondary | ICD-10-CM | POA: Diagnosis not present

## 2019-07-08 DIAGNOSIS — M2550 Pain in unspecified joint: Secondary | ICD-10-CM

## 2019-07-08 DIAGNOSIS — R69 Illness, unspecified: Secondary | ICD-10-CM | POA: Diagnosis not present

## 2019-07-08 DIAGNOSIS — F3341 Major depressive disorder, recurrent, in partial remission: Secondary | ICD-10-CM

## 2019-07-08 DIAGNOSIS — D860 Sarcoidosis of lung: Secondary | ICD-10-CM | POA: Diagnosis not present

## 2019-07-08 DIAGNOSIS — M818 Other osteoporosis without current pathological fracture: Secondary | ICD-10-CM

## 2019-07-08 DIAGNOSIS — T50905A Adverse effect of unspecified drugs, medicaments and biological substances, initial encounter: Secondary | ICD-10-CM | POA: Diagnosis not present

## 2019-07-08 MED ORDER — DULOXETINE HCL 20 MG PO CPEP: 20.0000 mg | ORAL_CAPSULE | Freq: Every day | ORAL | 3 refills | Status: DC

## 2019-07-08 MED ORDER — CELECOXIB 200 MG PO CAPS: 200.0000 mg | ORAL_CAPSULE | Freq: Two times a day (BID) | ORAL | 2 refills | Status: DC

## 2019-07-08 MED ORDER — ALENDRONATE SODIUM 70 MG PO TABS: 70.0000 mg | ORAL_TABLET | ORAL | 11 refills | Status: DC

## 2019-07-08 NOTE — Progress Notes (Signed)
[facility] [address] [place], [place] [address]  Internal MEDICINE  Office Visit Note  Patient Name: Brittany CannyKathy E Duerr  [id number]  [id number]  Date of Service: 07/11/2019  Chief Complaint  Patient presents with  . Medical Management of Chronic Issues    follow up , bmd done on 10/15 [facility], pft, ultrasound  . Back Pain    degenerative disc,     HPI Pt is here for routine follow up with multiple medical problems. She has been diagnosed with sarcoidosis of the lungs, has been maintained on prednisone 10 mg po qd, has not had a flare up in recent years. PFT's showed mild obstructive lung disease. She is using albuterol prn. BMD showed bone loss with T score of -1.9 in lumbar spine and femoral neck area. She is taking calcium and vit D. She is c/o pain in all her joints, cannot sleep well. Legs did have some thrombophlebitis, venous dopplers are negative for a DVT. She seems to be depressed all the time.   Current Medication: Outpatient Encounter Medications as of 07/08/2019  Medication Sig  . albuterol (PROVENTIL HFA;VENTOLIN HFA) 108 (90 Base) MCG/ACT inhaler Inhale 2 puffs into the lungs every 6 (six) hours as needed for wheezing or shortness of breath.  Marland Kitchen. aspirin-acetaminophen-caffeine (EXCEDRIN MIGRAINE) 250-250-65 MG per tablet Take 1 tablet by mouth every 6 (six) hours as needed for headache.   . dicyclomine (BENTYL) 10 MG capsule TAKE 1 CAPSULE BY MOUTH EVERY DAY  . irbesartan (AVAPRO) 75 MG tablet TAKE 1 TABLET BY MOUTH EVERY DAY  . ketorolac (TORADOL) 10 MG tablet Take 1 tablet (10 mg total) by mouth every 6 (six) hours as needed.  . mirtazapine (REMERON) 30 MG tablet Take 1 tablet (30 mg total) by mouth at bedtime.  . predniSONE (DELTASONE) 1 MG tablet Take 2  Tablets daily  . predniSONE (DELTASONE) 10 MG tablet Take 1 tablet (10 mg total) by mouth daily.  . Vitamin D, Ergocalciferol, (DRISDOL) 1.25 MG (50000 UT) CAPS capsule Take 1 capsule (50,000 Units total) by  mouth every 7 (seven) days.  . [DISCONTINUED] amLODipine (NORVASC) 5 MG tablet TAKE 1 TABLET BY MOUTH EVERY DAY  . [DISCONTINUED] FLUoxetine (PROZAC) 20 MG tablet Take 1 tablet (20 mg total) by mouth daily.  . [DISCONTINUED] losartan (COZAAR) 50 MG tablet Take 1 tablet (50 mg total) by mouth daily.  . [DISCONTINUED] rOPINIRole (REQUIP) 0.25 MG tablet Take 1 tablet (0.25 mg total) by mouth at bedtime.  Marland Kitchen. alendronate (FOSAMAX) 70 MG tablet Take 1 tablet (70 mg total) by mouth every 7 (seven) days. Take with a full glass of water on an empty stomach.  . celecoxib (CELEBREX) 200 MG capsule Take 1 capsule (200 mg total) by mouth 2 (two) times daily. Take one tab a day for inflammation  . DULoxetine (CYMBALTA) 20 MG capsule Take 1 capsule (20 mg total) by mouth daily. For chronic pain  . zolpidem (AMBIEN) 10 MG tablet Take 1 tablet (10 mg total) by mouth at bedtime as needed for sleep.   No facility-administered encounter medications on file as of 07/08/2019.     Surgical History: Past Surgical History:  Procedure Laterality Date  . ABDOMINAL HYSTERECTOMY  2009  . BREAST BIOPSY Left 02/15/2018   PREDOMINANTLY MATURE ADIPOSE TISSUE WITH VERY FOCAL AREAS OF BENIGN   . chamberlain procedure   2011  . IRRIGATION AND DEBRIDEMENT HEMATOMA Left 03/07/2018   Procedure: IRRIGATION AND DEBRIDEMENT HEMATOMA-LEFT BREAST;  Surgeon: Earline MayotteByrnett, Jeffrey W,  MD;  Location: [facility] ORS;  Service: General;  Laterality: Left;  . LUNG BIOPSY  2011  . RESECTION RIBS EXTRAPLEURAL  [id number]    Medical History: Past Medical History:  Diagnosis Date  . Anxiety   . Asthma   . Breast fibrocystic disorder 2013  . Hernia 2011   chest wall  . Lung mass 2011  . Sarcoidosis   . Shortness of breath    2011    Family History: Family History  Problem Relation Age of Onset  . Liver cancer Mother 37  . Bone cancer Maternal Aunt 60  . Breast cancer Maternal Aunt   . Breast cancer Paternal Aunt   . Bone cancer Paternal Aunt  75    Social History   Socioeconomic History  . Marital status: Single    Spouse name: Not on file  . Number of children: Not on file  . Years of education: Not on file  . Highest education level: Not on file  Occupational History  . Not on file  Social Needs  . Financial resource strain: Not on file  . Food insecurity    Worry: Not on file    Inability: Not on file  . Transportation needs    Medical: Not on file    Non-medical: Not on file  Tobacco Use  . Smoking status: Never Smoker  . Smokeless tobacco: Never Used  Substance and Sexual Activity  . Alcohol use: Yes    Comment: on occassion  . Drug use: No  . Sexual activity: Not on file  Lifestyle  . Physical activity    Days per week: Not on file    Minutes per session: Not on file  . Stress: Not on file  Relationships  . Social Musician on phone: Not on file    Gets together: Not on file    Attends religious service: Not on file    Active member of club or organization: Not on file    Attends meetings of clubs or organizations: Not on file    Relationship status: Not on file  . Intimate partner violence    Fear of current or ex partner: Not on file    Emotionally abused: Not on file    Physically abused: Not on file    Forced sexual activity: Not on file  Other Topics Concern  . Not on file  Social History Narrative  . Not on file      Review of Systems  Constitutional: Negative for chills, diaphoresis and fatigue.  HENT: Negative for ear pain, postnasal drip and sinus pressure.   Eyes: Negative for photophobia, discharge, redness, itching and visual disturbance.  Respiratory: Negative for cough, shortness of breath and wheezing.   Cardiovascular: Negative for chest pain, palpitations and leg swelling.  Gastrointestinal: Negative for abdominal pain, constipation, diarrhea, nausea and vomiting.  Genitourinary: Negative for dysuria and flank pain.  Musculoskeletal: Positive for arthralgias  and back pain. Negative for gait problem and neck pain.  Skin: Negative for color change.  Allergic/Immunologic: Negative for environmental allergies and food allergies.  Neurological: Negative for dizziness and headaches.  Hematological: Does not bruise/bleed easily.  Psychiatric/Behavioral: Positive for decreased concentration and dysphoric mood. Negative for agitation, behavioral problems (depression) and hallucinations.    Vital Signs: BP 120/88   Pulse 92   Temp (!) 97.3 F (36.3 C)   Resp 16   Ht 5\' 6"  (1.676 m)   Wt 167 lb (75.8 kg)  SpO2 98%   BMI 26.95 kg/m    Physical Exam Constitutional:      General: She is not in acute distress.    Appearance: She is well-developed. She is not diaphoretic.  HENT:     Head: Normocephalic and atraumatic.     Mouth/Throat:     Pharynx: No oropharyngeal exudate.  Eyes:     Pupils: Pupils are equal, round, and reactive to light.  Neck:     Musculoskeletal: Normal range of motion and neck supple.     Thyroid: No thyromegaly.     Vascular: No JVD.     Trachea: No tracheal deviation.  Cardiovascular:     Rate and Rhythm: Normal rate and regular rhythm.     Heart sounds: Normal heart sounds. No murmur. No friction rub. No gallop.   Pulmonary:     Effort: Pulmonary effort is normal. No respiratory distress.     Breath sounds: No wheezing or rales.  Chest:     Chest wall: No tenderness.  Abdominal:     General: Bowel sounds are normal.     Palpations: Abdomen is soft.  Musculoskeletal: Normal range of motion.  Lymphadenopathy:     Cervical: No cervical adenopathy.  Skin:    General: Skin is warm and dry.  Neurological:     Mental Status: She is alert and oriented to person, place, and time.     Cranial Nerves: No cranial nerve deficit.  Psychiatric:        Behavior: Behavior normal.        Thought Content: Thought content normal.        Judgment: Judgment normal.    Assessment/Plan: 1. Sarcoidosis of lung (HCC) -  Stable with no flare ups, PFT's reviewed with her, try to taper prednisone as directed    2. Diffuse arthralgia - Add an anti- inflammatory agent . She might have a psychosomatic component in it to as well  - celecoxib (CELEBREX) 200 MG capsule; Take 1 capsule (200 mg total) by mouth 2 (two) times daily. 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 Drug-induced osteoporosis - Continue with calcium and vits D, work with tapering her prednisone  - alendronate (FOSAMAX) 70 MG tablet; Take 1 tablet (70 mg total) by mouth every 7 (seven) days. Take with a full glass of water on an empty stomach.  Dispense: 4 tablet; Refill: 11  5. Recurrent major depressive disorder, in partial remission (HCC) - With multiple somatic symptoms, will add low dose Cymbalta  - DULoxetine (CYMBALTA) 20 MG capsule; Take 1 capsule (20 mg total) by mouth daily. For chronic pain  Dispense: 30 capsule; Refill: 3  General Counseling: Jazline verbalizes understanding of the findings of todays visit and agrees with plan of treatment. I have discussed any further diagnostic evaluation that may be needed or ordered today. We also reviewed her medications today. she has been encouraged to call the office with any questions or concerns that should arise related to todays visit.   Meds ordered this encounter  Medications  . celecoxib (CELEBREX) 200 MG capsule    Sig: Take 1 capsule (200 mg total) by mouth 2 (two) times daily. Take one tab a day for inflammation    Dispense:  30 capsule    Refill:  2  . DULoxetine (CYMBALTA) 20 MG capsule  Sig: Take 1 capsule (20 mg total) by mouth daily. For chronic pain    Dispense:  30 capsule    Refill:  3  . alendronate (FOSAMAX) 70 MG tablet    Sig: Take 1 tablet (70 mg total) by mouth every 7 (seven) days. Take with a full glass of water on an empty stomach.     Dispense:  4 tablet    Refill:  11    Time spent:25 Minutes      Dr Lavera Guise Internal medicine

## 2019-07-24 ENCOUNTER — Telehealth: Payer: Self-pay

## 2019-07-24 NOTE — Telephone Encounter (Signed)
CONFIRMED AND SCREENED FOR 07-30-19 OV. °

## 2019-07-30 ENCOUNTER — Encounter: Payer: Self-pay | Admitting: Internal Medicine

## 2019-07-30 ENCOUNTER — Other Ambulatory Visit: Payer: Self-pay

## 2019-07-30 ENCOUNTER — Ambulatory Visit: Payer: 59 | Admitting: Internal Medicine

## 2019-07-30 DIAGNOSIS — D860 Sarcoidosis of lung: Secondary | ICD-10-CM

## 2019-07-30 DIAGNOSIS — L309 Dermatitis, unspecified: Secondary | ICD-10-CM

## 2019-07-30 DIAGNOSIS — M2550 Pain in unspecified joint: Secondary | ICD-10-CM

## 2019-07-30 DIAGNOSIS — F3341 Major depressive disorder, recurrent, in partial remission: Secondary | ICD-10-CM

## 2019-07-30 DIAGNOSIS — R69 Illness, unspecified: Secondary | ICD-10-CM | POA: Diagnosis not present

## 2019-07-30 MED ORDER — DULOXETINE HCL 30 MG PO CPEP: 30.0000 mg | ORAL_CAPSULE | Freq: Every day | ORAL | 3 refills | Status: DC

## 2019-07-30 MED ORDER — BETAMETHASONE DIPROPIONATE AUG 0.05 % EX OINT: TOPICAL_OINTMENT | Freq: Two times a day (BID) | CUTANEOUS | 0 refills | Status: DC

## 2019-07-30 NOTE — Progress Notes (Signed)
[facility] [address] [place], [place] [address]  Internal MEDICINE  Office Visit Note  Patient Name: Brittany Huynh  [id number]  [id number]  Date of Service: 08/07/2019  Chief Complaint  Patient presents with  . Asthma  . Anxiety    with depression and insomnia   . Sarcoidosis   HPI Pt is here for routine follow up 1. She thinks Celebrex has helped her with her aches and pains 2. She is less depressed on Cymbalta and anxiety is improved 3. She is tapering down her prednisone slowly  4. Pt wants to hold off on Rheumatology  5. She is c/o rash on her forehead, she does not know if she has skin involvement with her sarcoid   Current Medication: Outpatient Encounter Medications as of 07/30/2019  Medication Sig  . albuterol (PROVENTIL HFA;VENTOLIN HFA) 108 (90 Base) MCG/ACT inhaler Inhale 2 puffs into the lungs every 6 (six) hours as needed for wheezing or shortness of breath.  Marland Kitchen alendronate (FOSAMAX) 70 MG tablet Take 1 tablet (70 mg total) by mouth every 7 (seven) days. Take with a full glass of water on an empty stomach.  Marland Kitchen aspirin-acetaminophen-caffeine (EXCEDRIN MIGRAINE) 250-250-65 MG per tablet Take 1 tablet by mouth every 6 (six) hours as needed for headache.   . celecoxib (CELEBREX) 200 MG capsule Take 1 capsule (200 mg total) by mouth 2 (two) times daily. Take one tab a day for inflammation  . dicyclomine (BENTYL) 10 MG capsule TAKE 1 CAPSULE BY MOUTH EVERY DAY  . irbesartan (AVAPRO) 75 MG tablet TAKE 1 TABLET BY MOUTH EVERY DAY  . ketorolac (TORADOL) 10 MG tablet Take 1 tablet (10 mg total) by mouth every 6 (six) hours as needed.  . mirtazapine (REMERON) 30 MG tablet Take 1 tablet (30 mg total) by mouth at bedtime.  . predniSONE (DELTASONE) 1 MG tablet Take 2  Tablets daily  . predniSONE (DELTASONE) 10 MG tablet Take 1 tablet (10 mg total) by mouth daily.  . Vitamin D, Ergocalciferol, (DRISDOL) 1.25 MG (50000 UT) CAPS capsule Take 1 capsule (50,000 Units  total) by mouth every 7 (seven) days.  . [DISCONTINUED] DULoxetine (CYMBALTA) 20 MG capsule Take 1 capsule (20 mg total) by mouth daily. For chronic pain  . augmented betamethasone dipropionate (DIPROLENE) 0.05 % ointment Apply topically 2 (two) times daily.  . DULoxetine (CYMBALTA) 30 MG capsule Take 1 capsule (30 mg total) by mouth daily.  Marland Kitchen zolpidem (AMBIEN) 10 MG tablet Take 1 tablet (10 mg total) by mouth at bedtime as needed for sleep.   No facility-administered encounter medications on file as of 07/30/2019.     Surgical History: Past Surgical History:  Procedure Laterality Date  . ABDOMINAL HYSTERECTOMY  2009  . BREAST BIOPSY Left 02/15/2018   PREDOMINANTLY MATURE ADIPOSE TISSUE WITH VERY FOCAL AREAS OF BENIGN   . chamberlain procedure   2011  . IRRIGATION AND DEBRIDEMENT HEMATOMA Left 03/07/2018   Procedure: IRRIGATION AND DEBRIDEMENT HEMATOMA-LEFT BREAST;  Surgeon: Earline Mayotte, MD;  Location: [facility] ORS;  Service: General;  Laterality: Left;  . LUNG BIOPSY  2011  . RESECTION RIBS EXTRAPLEURAL  [id number]    Medical History: Past Medical History:  Diagnosis Date  . Anxiety   . Asthma   . Breast fibrocystic disorder 2013  . Hernia 2011   chest wall  . Lung mass 2011  . Sarcoidosis   . Shortness of breath    2011    Family History: Family History  Problem  Relation Age of Onset  . Liver cancer Mother 46  . Bone cancer Maternal Aunt 60  . Breast cancer Maternal Aunt   . Breast cancer Paternal Aunt   . Bone cancer Paternal Aunt 50    Social History   Socioeconomic History  . Marital status: Single    Spouse name: Not on file  . Number of children: Not on file  . Years of education: Not on file  . Highest education level: Not on file  Occupational History  . Not on file  Social Needs  . Financial resource strain: Not on file  . Food insecurity    Worry: Not on file    Inability: Not on file  . Transportation needs    Medical: Not on file    Non-medical:  Not on file  Tobacco Use  . Smoking status: Never Smoker  . Smokeless tobacco: Never Used  Substance and Sexual Activity  . Alcohol use: Yes    Comment: on occassion  . Drug use: No  . Sexual activity: Not on file  Lifestyle  . Physical activity    Days per week: Not on file    Minutes per session: Not on file  . Stress: Not on file  Relationships  . Social Herbalist on phone: Not on file    Gets together: Not on file    Attends religious service: Not on file    Active member of club or organization: Not on file    Attends meetings of clubs or organizations: Not on file    Relationship status: Not on file  . Intimate partner violence    Fear of current or ex partner: Not on file    Emotionally abused: Not on file    Physically abused: Not on file    Forced sexual activity: Not on file  Other Topics Concern  . Not on file  Social History Narrative  . Not on file   Review of Systems  Constitutional: Negative for chills, diaphoresis and fatigue.  HENT: Negative for ear pain, postnasal drip and sinus pressure.   Eyes: Negative for photophobia, discharge, redness, itching and visual disturbance.  Respiratory: Negative for cough, shortness of breath and wheezing.   Cardiovascular: Negative for chest pain, palpitations and leg swelling.  Gastrointestinal: Negative for abdominal pain, constipation, diarrhea, nausea and vomiting.  Genitourinary: Negative for dysuria and flank pain.  Musculoskeletal: Positive for arthralgias. Negative for back pain, gait problem and neck pain.  Skin: Negative for color change.  Allergic/Immunologic: Negative for environmental allergies and food allergies.  Neurological: Negative for dizziness and headaches.  Hematological: Does not bruise/bleed easily.  Psychiatric/Behavioral: Negative for agitation, behavioral problems (depression) and hallucinations.   Vital Signs: BP (!) 132/92   Pulse 91   Resp 16   Ht 5\' 6"  (1.676 m)   Wt 167  lb (75.8 kg)   SpO2 100%   BMI 26.95 kg/m   Physical Exam Constitutional:      General: She is not in acute distress.    Appearance: She is well-developed. She is not diaphoretic.  HENT:     Head: Normocephalic and atraumatic.     Mouth/Throat:     Pharynx: No oropharyngeal exudate.  Eyes:     Pupils: Pupils are equal, round, and reactive to light.  Neck:     Musculoskeletal: Normal range of motion and neck supple.     Thyroid: No thyromegaly.     Vascular: No JVD.  Trachea: No tracheal deviation.  Cardiovascular:     Rate and Rhythm: Normal rate and regular rhythm.     Heart sounds: Normal heart sounds. No murmur. No friction rub. No gallop.   Pulmonary:     Effort: Pulmonary effort is normal. No respiratory distress.     Breath sounds: No wheezing or rales.  Chest:     Chest wall: No tenderness.  Abdominal:     General: Bowel sounds are normal.     Palpations: Abdomen is soft.  Musculoskeletal: Normal range of motion.  Lymphadenopathy:     Cervical: No cervical adenopathy.  Skin:    General: Skin is warm and dry.     Findings: Rash present.     Comments: Very faint rash on her forehead, ?? Eczema ( pt has a very oily moisturizer   Neurological:     Mental Status: She is alert and oriented to person, place, and time.     Cranial Nerves: No cranial nerve deficit.  Psychiatric:        Behavior: Behavior normal.        Thought Content: Thought content normal.        Judgment: Judgment normal.    Assessment/Plan: 1. Sarcoidosis of lung (HCC) - Continue to taper as before  - Ambulatory referral to Rheumatology ( will hold off)   2. Diffuse arthralgia - Continue Celebrex  - Ambulatory referral to Rheumatology  3. Recurrent major depressive disorder, in partial remission (HCC) - Increase Cymbalta 30 mg po qd   4. Eczema of face - Topical steroids prn   General Counseling: Sequia verbalizes understanding of the findings of todays visit and agrees with plan of  treatment. I have discussed any further diagnostic evaluation that may be needed or ordered today. We also reviewed her medications today. she has been encouraged to call the office with any questions or concerns that should arise related to todays visit.  Orders Placed This Encounter  Procedures  . Ambulatory referral to Rheumatology   Meds ordered this encounter  Medications  . DULoxetine (CYMBALTA) 30 MG capsule    Sig: Take 1 capsule (30 mg total) by mouth daily.    Dispense:  90 capsule    Refill:  3  . augmented betamethasone dipropionate (DIPROLENE) 0.05 % ointment    Sig: Apply topically 2 (two) times daily.    Dispense:  30 g    Refill:  0    Time spent: 14 Minutes Dr Lyndon Code Internal medicine

## 2019-08-20 DIAGNOSIS — M8588 Other specified disorders of bone density and structure, other site: Secondary | ICD-10-CM | POA: Insufficient documentation

## 2019-08-20 DIAGNOSIS — Z79899 Other long term (current) drug therapy: Secondary | ICD-10-CM | POA: Diagnosis not present

## 2019-08-20 DIAGNOSIS — Z7952 Long term (current) use of systemic steroids: Secondary | ICD-10-CM | POA: Diagnosis not present

## 2019-08-20 DIAGNOSIS — M2550 Pain in unspecified joint: Secondary | ICD-10-CM | POA: Insufficient documentation

## 2019-08-20 DIAGNOSIS — D869 Sarcoidosis, unspecified: Secondary | ICD-10-CM | POA: Diagnosis not present

## 2019-08-31 ENCOUNTER — Other Ambulatory Visit: Payer: Self-pay | Admitting: Internal Medicine

## 2019-09-18 DIAGNOSIS — M2550 Pain in unspecified joint: Secondary | ICD-10-CM | POA: Diagnosis not present

## 2019-09-18 DIAGNOSIS — D869 Sarcoidosis, unspecified: Secondary | ICD-10-CM | POA: Diagnosis not present

## 2019-09-18 DIAGNOSIS — R200 Anesthesia of skin: Secondary | ICD-10-CM | POA: Diagnosis not present

## 2019-09-18 DIAGNOSIS — Z7952 Long term (current) use of systemic steroids: Secondary | ICD-10-CM | POA: Diagnosis not present

## 2019-09-26 ENCOUNTER — Telehealth: Payer: Self-pay

## 2019-09-26 NOTE — Telephone Encounter (Signed)
Confirmed virtual visit with patient. klh 

## 2019-09-30 ENCOUNTER — Encounter: Payer: Self-pay | Admitting: Adult Health

## 2019-09-30 ENCOUNTER — Ambulatory Visit: Payer: 59 | Admitting: Adult Health

## 2019-09-30 VITALS — BP 128/86 | Ht 66.0 in | Wt 162.0 lb

## 2019-09-30 DIAGNOSIS — M818 Other osteoporosis without current pathological fracture: Secondary | ICD-10-CM

## 2019-09-30 DIAGNOSIS — G4700 Insomnia, unspecified: Secondary | ICD-10-CM | POA: Diagnosis not present

## 2019-09-30 DIAGNOSIS — T50905A Adverse effect of unspecified drugs, medicaments and biological substances, initial encounter: Secondary | ICD-10-CM | POA: Diagnosis not present

## 2019-09-30 DIAGNOSIS — F339 Major depressive disorder, recurrent, unspecified: Secondary | ICD-10-CM

## 2019-09-30 DIAGNOSIS — D869 Sarcoidosis, unspecified: Secondary | ICD-10-CM | POA: Diagnosis not present

## 2019-09-30 DIAGNOSIS — M2550 Pain in unspecified joint: Secondary | ICD-10-CM

## 2019-09-30 DIAGNOSIS — D860 Sarcoidosis of lung: Secondary | ICD-10-CM | POA: Diagnosis not present

## 2019-09-30 DIAGNOSIS — F3341 Major depressive disorder, recurrent, in partial remission: Secondary | ICD-10-CM

## 2019-09-30 DIAGNOSIS — J0120 Acute ethmoidal sinusitis, unspecified: Secondary | ICD-10-CM

## 2019-09-30 DIAGNOSIS — R69 Illness, unspecified: Secondary | ICD-10-CM | POA: Diagnosis not present

## 2019-09-30 MED ORDER — PREDNISONE 1 MG PO TABS: ORAL_TABLET | ORAL | 3 refills | Status: DC

## 2019-09-30 MED ORDER — PREDNISONE 5 MG PO TABS: 10.0000 mg | ORAL_TABLET | Freq: Every day | ORAL | 1 refills | Status: DC

## 2019-09-30 MED ORDER — ZOLPIDEM TARTRATE 10 MG PO TABS: 10.0000 mg | ORAL_TABLET | Freq: Every evening | ORAL | 1 refills | Status: DC | PRN

## 2019-09-30 MED ORDER — MIRTAZAPINE 30 MG PO TABS: 30.0000 mg | ORAL_TABLET | Freq: Every day | ORAL | 0 refills | Status: DC

## 2019-09-30 MED ORDER — AMOXICILLIN 500 MG PO CAPS: 500.0000 mg | ORAL_CAPSULE | Freq: Two times a day (BID) | ORAL | 0 refills | Status: DC

## 2019-09-30 NOTE — Progress Notes (Signed)
[facility] [address] [place], [place] [address]  Internal MEDICINE  Telephone Visit  Patient Name: Brittany Huynh  [id number]  [id number]  Date of Service: 10/01/2019  I connected with the patient at [phone] by telephone and verified the patients identity using two identifiers.   I discussed the limitations, risks, security and privacy concerns of performing an evaluation and management service by telephone and the availability of in person appointments. I also discussed with the patient that there may be a patient responsible charge related to the service.  The patient expressed understanding and agrees to proceed.    Chief Complaint  Patient presents with  . Follow-up  . Hypertension  . Anxiety  . Sinusitis    DRAINAGE WITH GREEN MUCUS    HPI  Pt is seen via video.  She has a history of depression, arthralgia, HTN and sarcoidosis.  She reports her depression, anxiety are well controlled at this time.  Her chronic pain issues are slowly resolving with Celebrex and Cymbalta. She is seeing rheumatology, and they are tapering her prednisone, and have started her on plaquenil 200mg  daily.   Sinus drainage and green mucous.  She also has sinus pressure. She also has headaches but denies fever.  Current Medication: Outpatient Encounter Medications as of 09/30/2019  Medication Sig  . albuterol (PROVENTIL HFA;VENTOLIN HFA) 108 (90 Base) MCG/ACT inhaler Inhale 2 puffs into the lungs every 6 (six) hours as needed for wheezing or shortness of breath.  11/28/2019 alendronate (FOSAMAX) 70 MG tablet Take 1 tablet (70 mg total) by mouth every 7 (seven) days. Take with a full glass of water on an empty stomach.  Marland Kitchen aspirin-acetaminophen-caffeine (EXCEDRIN MIGRAINE) 250-250-65 MG per tablet Take 1 tablet by mouth every 6 (six) hours as needed for headache.   . augmented betamethasone dipropionate (DIPROLENE-AF) 0.05 % ointment APPLY TO AFFECTED AREA TWICE A DAY  . celecoxib (CELEBREX) 200 MG  capsule Take 1 capsule (200 mg total) by mouth 2 (two) times daily. Take one tab a day for inflammation  . dicyclomine (BENTYL) 10 MG capsule TAKE 1 CAPSULE BY MOUTH EVERY DAY  . DULoxetine (CYMBALTA) 30 MG capsule Take 1 capsule (30 mg total) by mouth daily.  . irbesartan (AVAPRO) 75 MG tablet TAKE 1 TABLET BY MOUTH EVERY DAY  . ketorolac (TORADOL) 10 MG tablet Take 1 tablet (10 mg total) by mouth every 6 (six) hours as needed.  . mirtazapine (REMERON) 30 MG tablet Take 1 tablet (30 mg total) by mouth at bedtime.  . predniSONE (DELTASONE) 1 MG tablet PT is tapering dose, currently taking 9mg  weekly and will step down each week by 1mg .  . predniSONE (DELTASONE) 5 MG tablet Take 2 tablets (10 mg total) by mouth daily.  . Vitamin D, Ergocalciferol, (DRISDOL) 1.25 MG (50000 UT) CAPS capsule Take 1 capsule (50,000 Units total) by mouth every 7 (seven) days.  . [DISCONTINUED] mirtazapine (REMERON) 30 MG tablet Take 1 tablet (30 mg total) by mouth at bedtime.  . [DISCONTINUED] predniSONE (DELTASONE) 1 MG tablet Take 2  Tablets daily  . [DISCONTINUED] predniSONE (DELTASONE) 10 MG tablet Take 1 tablet (10 mg total) by mouth daily.  Marland Kitchen amoxicillin (AMOXIL) 500 MG capsule Take 1 capsule (500 mg total) by mouth 2 (two) times daily.  zolpidem (AMBIEN) 10 MG tablet Take 1 tablet (10 mg total) by mouth at bedtime as needed for sleep.  . [DISCONTINUED] zolpidem (AMBIEN) 10 MG tablet Take 1 tablet (10 mg total) by mouth  at bedtime as needed for sleep.   No facility-administered encounter medications on file as of 09/30/2019.    Surgical History: Past Surgical History:  Procedure Laterality Date  . ABDOMINAL HYSTERECTOMY  2009  . BREAST BIOPSY Left 02/15/2018   PREDOMINANTLY MATURE ADIPOSE TISSUE WITH VERY FOCAL AREAS OF BENIGN   . chamberlain procedure   2011  . IRRIGATION AND DEBRIDEMENT HEMATOMA Left 03/07/2018   Procedure: IRRIGATION AND DEBRIDEMENT HEMATOMA-LEFT BREAST;  Surgeon: Earline Mayotte, MD;   Location: [facility] ORS;  Service: General;  Laterality: Left;  . LUNG BIOPSY  2011  . RESECTION RIBS EXTRAPLEURAL  18299    Medical History: Past Medical History:  Diagnosis Date  . Anxiety   . Asthma   . Breast fibrocystic disorder 2013  . Hernia 2011   chest wall  . Lung mass 2011  . Sarcoidosis   . Shortness of breath    2011    Family History: Family History  Problem Relation Age of Onset  . Liver cancer Mother 45  . Bone cancer Maternal Aunt 60  . Breast cancer Maternal Aunt   . Breast cancer Paternal Aunt   . Bone cancer Paternal Aunt 63    Social History   Socioeconomic History  . Marital status: Single    Spouse name: Not on file  . Number of children: Not on file  . Years of education: Not on file  . Highest education level: Not on file  Occupational History  . Not on file  Tobacco Use  . Smoking status: Never Smoker  . Smokeless tobacco: Never Used  Substance and Sexual Activity  . Alcohol use: Yes    Comment: on occassion  . Drug use: No  . Sexual activity: Not on file  Other Topics Concern  . Not on file  Social History Narrative  . Not on file   Social Determinants of Health   Financial Resource Strain:   . Difficulty of Paying Living Expenses: Not on file  Food Insecurity:   . Worried About Programme researcher, broadcasting/film/video in the Last Year: Not on file  . Ran Out of Food in the Last Year: Not on file  Transportation Needs:   . Lack of Transportation (Medical): Not on file  . Lack of Transportation (Non-Medical): Not on file  Physical Activity:   . Days of Exercise per Week: Not on file  . Minutes of Exercise per Session: Not on file  Stress:   . Feeling of Stress : Not on file  Social Connections:   . Frequency of Communication with Friends and Family: Not on file  . Frequency of Social Gatherings with Friends and Family: Not on file  . Attends Religious Services: Not on file  . Active Member of Clubs or Organizations: Not on file  . Attends Occupational hygienist Meetings: Not on file  . Marital Status: Not on file  Intimate Partner Violence:   . Fear of Current or Ex-Partner: Not on file  . Emotionally Abused: Not on file  . Physically Abused: Not on file  . Sexually Abused: Not on file      Review of Systems  Constitutional: Negative for chills, fatigue and unexpected weight change.  HENT: Negative for congestion, rhinorrhea, sneezing and sore throat.   Eyes: Negative for photophobia, pain and redness.  Respiratory: Negative for cough, chest tightness and shortness of breath.   Cardiovascular: Negative for chest pain and palpitations.  Gastrointestinal: Negative for abdominal pain, constipation, diarrhea, nausea  and vomiting.  Endocrine: Negative.   Genitourinary: Negative for dysuria and frequency.  Musculoskeletal: Negative for arthralgias, back pain, joint swelling and neck pain.  Skin: Negative for rash.  Allergic/Immunologic: Negative.   Neurological: Negative for tremors and numbness.  Hematological: Negative for adenopathy. Does not bruise/bleed easily.  Psychiatric/Behavioral: Negative for behavioral problems and sleep disturbance. The patient is not nervous/anxious.     Vital Signs: BP 128/86   Ht 5\' 6"  (1.676 m)   Wt 162 lb (73.5 kg)   BMI 26.15 kg/m    Observation/Objective: Well appearing, NAD noted.    Assessment/Plan: 1. Acute non-recurrent ethmoidal sinusitis Advised patient to take entire course of antibiotics as prescribed with food. Pt should return to clinic in 7-10 days if symptoms fail to improve or new symptoms develop.  - amoxicillin (AMOXIL) 500 MG capsule; Take 1 capsule (500 mg total) by mouth 2 (two) times daily.  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  Currently on 9mg  daily. - predniSONE (DELTASONE) 1 MG tablet; PT is tapering dose, currently taking 9mg  weekly and will step down each week by 1mg .   Dispense: 60 tablet; Refill: 3 - predniSONE (DELTASONE) 5 MG tablet; Take 2 tablets (10 mg total) by mouth daily.  Dispense: 30 tablet; Refill: 1  3. Episode of recurrent major depressive disorder, unspecified depression episode severity ([place]) Refilled remeron, good control at this time.  - mirtazapine (REMERON) 30 MG tablet; Take 1 tablet (30 mg total) by mouth at bedtime.  Dispense: 90 tablet; Refill: 0  4. Diffuse arthralgia Improving, continue present management.  5. Recurrent major depressive disorder, in partial remission ([place]) Controlled, continue cymbalta as directed.   6. Drug-induced osteoporosis Continue current mgmt with fosamax  7. Insomnia, unspecified type Refilled ambien. - zolpidem (AMBIEN) 10 MG tablet; Take 1 tablet (10 mg total) by mouth at bedtime as needed for sleep.  Dispense: 30 tablet; Refill: 1  General Counseling: Giovannina verbalizes understanding of the findings of today's phone visit and agrees with plan of treatment. I have discussed any further diagnostic evaluation that may be needed or ordered today. We also reviewed her medications today. she has been encouraged to call the office with any questions or concerns that should arise related to todays visit.    No orders of the defined types were placed in this encounter.   Meds ordered this encounter  Medications  . amoxicillin (AMOXIL) 500 MG capsule    Sig: Take 1 capsule (500 mg total) by mouth 2 (two) times daily.    Dispense:  14 capsule    Refill:  0  . predniSONE (DELTASONE) 1 MG tablet    Sig: PT is tapering dose, currently taking 9mg  weekly and will step down each week by 1mg .    Dispense:  60 tablet    Refill:  3  . predniSONE (DELTASONE) 5 MG tablet    Sig: Take 2 tablets (10 mg total) by mouth daily.    Dispense:  30 tablet    Refill:  1    Patient is tapering meds.  currenlty on 8 mg weekly,and will go down by one mg each week.  . zolpidem (AMBIEN) 10 MG tablet    Sig: Take 1 tablet  (10 mg total) by mouth at bedtime as needed for sleep.    Dispense:  30 tablet    Refill:  1  . mirtazapine (REMERON) 30 MG tablet  Sig: Take 1 tablet (30 mg total) by mouth at bedtime.    Dispense:  90 tablet    Refill:  0    Time spent: 15 Minutes    Blima Ledger AGNP-C Internal medicine

## 2019-10-07 ENCOUNTER — Emergency Department: Payer: 59

## 2019-10-07 ENCOUNTER — Emergency Department
Admission: EM | Admit: 2019-10-07 | Discharge: 2019-10-07 | Disposition: A | Discharge status: Home / Self Care | Payer: 59 | Attending: Emergency Medicine | Admitting: Emergency Medicine

## 2019-10-07 DIAGNOSIS — I10 Essential (primary) hypertension: Secondary | ICD-10-CM | POA: Insufficient documentation

## 2019-10-07 DIAGNOSIS — Z79899 Other long term (current) drug therapy: Secondary | ICD-10-CM | POA: Diagnosis not present

## 2019-10-07 DIAGNOSIS — Z20822 Contact with and (suspected) exposure to covid-19: Secondary | ICD-10-CM | POA: Diagnosis not present

## 2019-10-07 DIAGNOSIS — R05 Cough: Secondary | ICD-10-CM | POA: Diagnosis not present

## 2019-10-07 DIAGNOSIS — J45909 Unspecified asthma, uncomplicated: Secondary | ICD-10-CM | POA: Diagnosis not present

## 2019-10-07 DIAGNOSIS — M7918 Myalgia, other site: Secondary | ICD-10-CM | POA: Diagnosis not present

## 2019-10-07 DIAGNOSIS — R0602 Shortness of breath: Secondary | ICD-10-CM | POA: Insufficient documentation

## 2019-10-07 DIAGNOSIS — D869 Sarcoidosis, unspecified: Secondary | ICD-10-CM | POA: Insufficient documentation

## 2019-10-07 DIAGNOSIS — R0789 Other chest pain: Secondary | ICD-10-CM | POA: Insufficient documentation

## 2019-10-07 LAB — CBC WITH DIFFERENTIAL/PLATELET
Abs Immature Granulocytes: 0.05 10*3/uL (ref 0.00–0.07)
Basophils Absolute: 0.1 10*3/uL (ref 0.0–0.1)
Basophils Relative: 1 %
Eosinophils Absolute: 0.2 10*3/uL (ref 0.0–0.5)
Eosinophils Relative: 2 %
HCT: 43.2 % (ref 36.0–46.0)
Hemoglobin: 13.8 g/dL (ref 12.0–15.0)
Immature Granulocytes: 0 %
Lymphocytes Relative: 14 %
Lymphs Abs: 1.6 10*3/uL (ref 0.7–4.0)
MCH: 27.2 pg (ref 26.0–34.0)
MCHC: 31.9 g/dL (ref 30.0–36.0)
MCV: 85.2 fL (ref 80.0–100.0)
Monocytes Absolute: 0.7 10*3/uL (ref 0.1–1.0)
Monocytes Relative: 6 %
Neutro Abs: 9 10*3/uL — ABNORMAL HIGH (ref 1.7–7.7)
Neutrophils Relative %: 77 %
Platelets: 252 10*3/uL (ref 150–400)
RBC: 5.07 MIL/uL (ref 3.87–5.11)
RDW: 13.7 % (ref 11.5–15.5)
WBC: 11.6 10*3/uL — ABNORMAL HIGH (ref 4.0–10.5)
nRBC: 0 % (ref 0.0–0.2)

## 2019-10-07 LAB — COMPREHENSIVE METABOLIC PANEL
ALT: 16 U/L (ref 0–44)
AST: 22 U/L (ref 15–41)
Albumin: 3.7 g/dL (ref 3.5–5.0)
Alkaline Phosphatase: 90 U/L (ref 38–126)
Anion gap: 9 (ref 5–15)
BUN: 14 mg/dL (ref 6–20)
CO2: 26 mmol/L (ref 22–32)
Calcium: 9.2 mg/dL (ref 8.9–10.3)
Chloride: 105 mmol/L (ref 98–111)
Creatinine, Ser: 0.93 mg/dL (ref 0.44–1.00)
GFR calc Af Amer: >60 mL/min (ref >60)
GFR calc non Af Amer: >60 mL/min (ref >60)
Glucose, Bld: 93 mg/dL (ref 70–99)
Potassium: 3.3 mmol/L — ABNORMAL LOW (ref 3.5–5.1)
Sodium: 140 mmol/L (ref 135–145)
Total Bilirubin: 0.5 mg/dL (ref 0.3–1.2)
Total Protein: 7.4 g/dL (ref 6.5–8.1)

## 2019-10-07 LAB — RESPIRATORY PANEL BY RT PCR (FLU A&B, COVID)
Influenza A by PCR: NEGATIVE
Influenza B by PCR: NEGATIVE
SARS Coronavirus 2 by RT PCR: NEGATIVE

## 2019-10-07 LAB — TROPONIN I (HIGH SENSITIVITY): Troponin I (High Sensitivity): 3 ng/L (ref <18)

## 2019-10-07 LAB — POC SARS CORONAVIRUS 2 AG: SARS Coronavirus 2 Ag: NEGATIVE

## 2019-10-07 LAB — LIPASE, BLOOD: Lipase: 26 U/L (ref 11–51)

## 2019-10-07 LAB — SARS CORONAVIRUS 2 (TAT 6-24 HRS): SARS Coronavirus 2: NEGATIVE

## 2019-10-07 LAB — FIBRIN DERIVATIVES D-DIMER (ARMC ONLY): Fibrin derivatives D-dimer (ARMC): 457.42 ng/mL (FEU) (ref 0.00–499.00)

## 2019-10-07 MED ORDER — IPRATROPIUM-ALBUTEROL 0.5-2.5 (3) MG/3ML IN SOLN
3.0000 mL | Freq: Once | RESPIRATORY_TRACT | Status: AC
  Administered 2019-10-07: 3 mL via RESPIRATORY_TRACT
  Filled 2019-10-07: qty 6

## 2019-10-07 MED ORDER — METHYLPREDNISOLONE SODIUM SUCC 125 MG IJ SOLR
125.0000 mg | Freq: Once | INTRAMUSCULAR | Status: AC
  Administered 2019-10-07: 06:00:00 125 mg via INTRAVENOUS
  Filled 2019-10-07: qty 2

## 2019-10-07 MED ORDER — PREDNISONE 20 MG PO TABS: 40.0000 mg | ORAL_TABLET | Freq: Every day | ORAL | 0 refills | Status: AC

## 2019-10-07 MED ORDER — SODIUM CHLORIDE 0.9 % IV BOLUS
1000.0000 mL | Freq: Once | INTRAVENOUS | Status: AC
  Administered 2019-10-07: 1000 mL via INTRAVENOUS

## 2019-10-07 MED ORDER — IPRATROPIUM-ALBUTEROL 0.5-2.5 (3) MG/3ML IN SOLN
3.0000 mL | Freq: Once | RESPIRATORY_TRACT | Status: AC
  Administered 2019-10-07: 3 mL via RESPIRATORY_TRACT

## 2019-10-07 MED ORDER — ACETAMINOPHEN 500 MG PO TABS
1000.0000 mg | ORAL_TABLET | Freq: Once | ORAL | Status: AC
  Administered 2019-10-07: 1000 mg via ORAL
  Filled 2019-10-07: qty 2

## 2019-10-07 MED ORDER — IOHEXOL 350 MG/ML SOLN
75.0000 mL | Freq: Once | INTRAVENOUS | Status: AC | PRN
  Administered 2019-10-07: 75 mL via INTRAVENOUS

## 2019-10-07 NOTE — ED Notes (Signed)
ED Provider at bedside.  Pt reports SOB, chest tight, chills, body aches, cough and cough, hx of sarcoidosis

## 2019-10-07 NOTE — ED Provider Notes (Signed)
[facility] Emergency Department Provider Note  ____________________________________________  Time seen: Approximately 5:08 AM  I have reviewed the triage vital signs and the nursing notes.   HISTORY  Chief Complaint Shortness of Breath and Generalized Body Aches   HPI Brittany Huynh is a 54 y.o. female with a history of sarcoidosis and asthma  who presents for evaluation of Covid-like symptoms.  Patient reports that her symptoms started yesterday.  She has a cough productive of green sputum, sore throat, body aches, chills, low-grade fever, mild diarrhea.  She is complaining of chest tightness and shortness of breath which is worse with ambulation but present constantly.  She has been using her inhaler at home with no significant relief.  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  Patient denies any personal or family history of blood clots, recent travel immobilization, leg pain or swelling, exogenous hormones.  She does endorse some streaks of blood in her sputum intermittently.  Past Medical History:  Diagnosis Date  . Anxiety   . Asthma   . Breast fibrocystic disorder 2013  . Hernia 2011   chest wall  . Lung mass 2011  . Sarcoidosis   . Shortness of breath    2011    Patient Active Problem List   Diagnosis Date Noted  . Asthma 12/06/2018  . Other atopic dermatitis 04/07/2018  . Breast pain, left 02/28/2018  . Essential hypertension 01/14/2018  . Other chest pain 01/14/2018  . Fatigue 01/14/2018  . Polyneuropathic pain 01/14/2018  . Acute vaginitis 01/14/2018  . Unspecified menopausal and perimenopausal disorder 01/14/2018  . Vitamin D deficiency 01/14/2018  . Dysuria 01/14/2018  . History of chickenpox 11/27/2017  . Shingles 11/27/2017  . 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 Shortness of breath 09/15/2017  . Edema 09/15/2017  . Recurrent major depressive disorder (HCC) 09/15/2017  . Migraine 09/15/2017  .  Inflammatory polyarthritis (HCC) 09/15/2017  . Myopathy 09/15/2017  . Long term current use of systemic steroids 09/15/2017  . Tachycardia, unspecified 09/15/2017  . Gastro-esophageal reflux disease without esophagitis 09/15/2017  . Bacterial intestinal infection 09/15/2017  . Hypersomnia, unspecified 09/15/2017  . Iron deficiency anemia 09/15/2017  . Neuralgia and neuritis 09/15/2017  . Restless leg syndrome 09/15/2017  . Generalized anxiety disorder 09/15/2017  . Fibrocystic breast disease 04/05/2013  . Screening for breast cancer 04/05/2013  . Posttraumatic hematoma of left breast 04/05/2013    Past Surgical History:  Procedure Laterality Date  . ABDOMINAL HYSTERECTOMY  2009  . BREAST BIOPSY Left 02/15/2018   PREDOMINANTLY MATURE ADIPOSE TISSUE WITH VERY FOCAL AREAS OF BENIGN   . chamberlain procedure   2011  . IRRIGATION AND DEBRIDEMENT HEMATOMA Left 03/07/2018   Procedure: IRRIGATION AND DEBRIDEMENT HEMATOMA-LEFT BREAST;  Surgeon: Earline Mayotte, MD;  Location: [facility] ORS;  Service: General;  Laterality: Left;  . LUNG BIOPSY  2011  . RESECTION RIBS EXTRAPLEURAL  95638    Prior to Admission medications   Medication Sig Start Date End Date Taking? Authorizing Provider  albuterol (PROVENTIL HFA;VENTOLIN HFA) 108 (90 Base) MCG/ACT inhaler Inhale 2 puffs into the lungs every 6 (six) hours as needed for wheezing or shortness of breath. 11/13/18  Yes Scarboro, Coralee North, NP  alendronate (FOSAMAX) 70 MG tablet Take 1 tablet (70 mg total) by mouth every 7 (seven) days. Take with a full glass of water on an empty stomach. 07/08/19  Yes Lyndon Code, MD  aspirin-acetaminophen-caffeine [facility] MIGRAINE)  250-250-65 MG per tablet Take 1 tablet by mouth every 6 (six) hours as needed for headache.    Yes [provider]  augmented betamethasone dipropionate (DIPROLENE-AF) 0.05 % ointment APPLY TO AFFECTED AREA TWICE A DAY Patient taking differently: Apply 1 application topically 2  (two) times daily.  09/02/19  Yes Lyndon Code, MD  celecoxib (CELEBREX) 200 MG capsule Take 1 capsule (200 mg total) by mouth 2 (two) times daily. Take one tab a day for inflammation Patient taking differently: Take 200 mg by mouth daily.  07/08/19  Yes Lyndon Code, MD  dicyclomine (BENTYL) 10 MG capsule TAKE 1 CAPSULE BY MOUTH EVERY DAY Patient taking differently: Take 10 mg by mouth daily.  06/24/19  Yes Scarboro, Coralee North, NP  DULoxetine (CYMBALTA) 30 MG capsule Take 1 capsule (30 mg total) by mouth daily. 07/30/19  Yes Lyndon Code, MD  mirtazapine (REMERON) 30 MG tablet Take 1 tablet (30 mg total) by mouth at bedtime. 09/30/19  Yes Scarboro, Coralee North, NP  predniSONE (DELTASONE) 1 MG tablet PT is tapering dose, currently taking 9mg  weekly and will step down each week by 1mg . Patient taking differently: Take 8 mg by mouth daily.  09/30/19  Yes Scarboro, Coralee North, NP  Vitamin D, Ergocalciferol, (DRISDOL) 1.25 MG (50000 UT) CAPS capsule Take 1 capsule (50,000 Units total) by mouth every 7 (seven) days. 06/14/19  Yes Scarboro, Coralee North, NP  zolpidem (AMBIEN) 10 MG tablet Take 1 tablet (10 mg total) by mouth at bedtime as needed for sleep. 09/30/19 11/29/19 Yes Scarboro, Coralee North, NP  predniSONE (DELTASONE) 20 MG tablet Take 2 tablets (40 mg total) by mouth daily for 5 days. 10/07/19 10/12/19  Concha Se, MD    Allergies Onion and Adhesive [tape]  Family History  Problem Relation Age of Onset  . Liver cancer Mother 56  . Bone cancer Maternal Aunt 60  . Breast cancer Maternal Aunt   . Breast cancer Paternal Aunt   . Bone cancer Paternal Aunt 27    Social History Social History   Tobacco Use  . Smoking status: Never Smoker  . Smokeless tobacco: Never Used  Substance Use Topics  . Alcohol use: Yes    Comment: on occassion  . Drug use: No    Review of Systems  Constitutional: + fever, chills, body aches Eyes: Negative for visual changes. ENT: + sore throat. Neck: No neck pain  Cardiovascular:  Negative for chest pain. + chest tightness Respiratory: + shortness of breath and cough Gastrointestinal: Negative for abdominal pain, vomiting. + diarrhea. Genitourinary: Negative for dysuria. Musculoskeletal: Negative for back pain. Skin: Negative for rash. Neurological: Negative for headaches, weakness or numbness. Psych: No SI or HI  ____________________________________________   PHYSICAL EXAM:  VITAL SIGNS: ED Triage Vitals [10/07/19 0450]  Enc Vitals Group     BP (!) 128/95     Pulse Rate (!) 134     Resp 20     Temp 99.2 F (37.3 C)     Temp Source Oral     SpO2 98 %     Weight 162 lb (73.5 kg)     Height 5\' 6"  (1.676 m)     Head Circumference      Peak Flow      Pain Score 7     Pain Loc      Pain Edu?      Excl. in GC?     Constitutional: Alert and oriented. Well appearing and  in no apparent distress. HEENT:      Head: Normocephalic and atraumatic.         Eyes: Conjunctivae are normal. Sclera is non-icteric.       Mouth/Throat: Mucous membranes are moist.       Neck: Supple with no signs of meningismus. Cardiovascular: Tachycardic with regular rhythm. No murmurs, gallops, or rubs. 2+ symmetrical distal pulses are present in all extremities. No JVD. Respiratory: Normal respiratory effort. Lungs are clear to auscultation bilaterally. No wheezes, crackles, or rhonchi.  Gastrointestinal: Soft, non tender, and non distended with positive bowel sounds. No rebound or guarding. Musculoskeletal: Nontender with normal range of motion in all extremities. No edema, cyanosis, or erythema of extremities. Neurologic: Normal speech and language. Face is symmetric. Moving all extremities. No gross focal neurologic deficits are appreciated. Skin: Skin is warm, dry and intact. No rash noted. Psychiatric: Mood and affect are normal. Speech and behavior are normal.  ____________________________________________   LABS (all labs ordered are listed, but only abnormal results are  displayed)  Labs Reviewed  CBC WITH DIFFERENTIAL/PLATELET - Abnormal; Notable for the following components:      Result Value   WBC 11.6 (*)    Neutro Abs 9.0 (*)    All other components within normal limits  COMPREHENSIVE METABOLIC PANEL - Abnormal; Notable for the following components:   Potassium 3.3 (*)    All other components within normal limits  RESPIRATORY PANEL BY RT PCR (FLU A&B, COVID)  SARS CORONAVIRUS 2 (TAT 6-24 HRS)  FIBRIN DERIVATIVES D-DIMER ([facility] ONLY)  LIPASE, BLOOD  POC SARS CORONAVIRUS 2 AG -  ED  POC SARS CORONAVIRUS 2 AG  TROPONIN I (HIGH SENSITIVITY)   ____________________________________________  EKG  none ____________________________________________  RADIOLOGY  I have personally reviewed the images performed during this visit and I agree with the Radiologist's read.   Interpretation by Radiologist:  CT Angio Chest PE W and/or Wo Contrast  Result Date: 10/07/2019 CLINICAL DATA:  Chest tightness, shortness of breath, myalgias EXAM: CT ANGIOGRAPHY CHEST WITH CONTRAST TECHNIQUE: Multidetector CT imaging of the chest was performed using the standard protocol during bolus administration of intravenous contrast. Multiplanar CT image reconstructions and MIPs were obtained to evaluate the vascular anatomy. CONTRAST:  76mL OMNIPAQUE IOHEXOL 350 MG/ML SOLN COMPARISON:  12/14/2013 FINDINGS: Cardiovascular: Pulmonary arteries appear patent. No significant filling defect or pulmonary embolus demonstrated by CTA. Intact thoracic aorta. Negative for aneurysm or dissection. Patent 3 vessel arch anatomy. Trace fluid in the superior pericardial recess noted. Normal heart size.  No pericardial or pleural effusion. Mediastinum/Nodes: No enlarged mediastinal, hilar, or axillary lymph nodes. Thyroid gland, trachea, and esophagus demonstrate no significant findings. Lungs/Pleura: Stable medial right upper lobe bronchovascular elongated and lobulated nodule with adjacent  pleuroparenchymal scarring, maximal diameter 16 mm, image 25 series 6. Nodule is stable dating back to 2015. Stable smoothly marginated inferior right upper lobe nodule measuring 9 mm, image 43 series 6. Previously described right lower lobe posterior subpleural nodule is less apparent because of adjacent atelectasis. Stable calcified granulomas in the mediastinum and hilar regions bilaterally. Dependent basilar atelectasis/hypoventilatory changes noted. No definite acute airspace process, collapse or consolidation. No interstitial process or edema. No pleural abnormality, effusion or pneumothorax. Trachea and central airways are patent. Upper Abdomen: No acute abnormality. Musculoskeletal: No acute or significant osseous finding. Review of the MIP images confirms the above findings. IMPRESSION: Negative for significant acute pulmonary embolus by CTA. Stable right upper lobe and right middle lobe nodules with areas  of parenchymal scarring dating back to 2015. Associated calcified mediastinal and hilar granulomas. Findings compatible with chronic sarcoidosis. No significant change or interval progression. No other superimposed acute chest process Bibasilar atelectasis Electronically Signed   By: Jerilynn Mages.  Shick M.D.   On: 10/07/2019 08:33   DG Chest Portable 1 View  Result Date: 10/07/2019 CLINICAL DATA:  Shortness of breath, chest tightness, chills body aches and cough. History of sarcoidosis EXAM: PORTABLE CHEST 1 VIEW COMPARISON:  Radiograph 03/17/2019 FINDINGS: Mixed streaky and patchy opacities are present towards the lung bases. Lung volumes are diminished. No convincing features of edema. No pneumothorax or effusion. The cardiomediastinal contours are unremarkable. No acute osseous or soft tissue abnormality. IMPRESSION: Mixed patchy in the setting of low lung volumes favor atelectasis though early infection could have a similar appearance in the appropriate clinical setting. Electronically Signed   By: Lovena Le M.D.   On: 10/07/2019 05:21      ____________________________________________   PROCEDURES  Procedure(s) performed: None Procedures Critical Care performed:  None ____________________________________________   INITIAL IMPRESSION / ASSESSMENT AND PLAN / ED COURSE  54 y.o. female with a history of sarcoidosis and asthma  who presents for evaluation of Covid-like symptoms since yesterday.  Patient has slightly increased work of breathing with respiratory rate of 20 but satting 97% or more with ambulation and at rest, she is moving good air with no crackles or wheezing.  Will give duo nebs and albuterol.  Will get chest x-ray, Covid swab, labs, D-dimer, and troponin.  Differential diagnosis including Covid versus influenza versus pneumonia versus viral illness versus PE.    Clinical Course as of Oct 06 2324  Mon Oct 07, 2019  6270 Rapid COVID antigen negative. Covid and Flu PCR pending. CXR with no infiltrate. Labs pending. HR improved with IVF although still slightly tachycardic in the setting of duonebs. Care transferred to Dr. Jari Pigg.    [CV]  0700 Labs with mild leukocytosis and left shift. Negative troponin and EKG with no signs of myocarditis. Negative d-dimer. Covid and Flu pending. Patient feels improved after duonebs   [CV]    Clinical Course User Index [CV] Alfred Levins [place], MD      As part of my medical decision making, I reviewed the following data within the [place] notes reviewed and incorporated, Labs reviewed , EKG interpreted , Old chart reviewed, Patient signed out to Dr. Jari Pigg, Radiograph reviewed , Notes from prior ED visits and [place] Controlled Substance Database   Please note:  Patient was evaluated in Emergency Department today for the symptoms described in the history of present illness. Patient was evaluated in the context of the global COVID-19 pandemic, which necessitated consideration that the patient might be at risk for  infection with the SARS-CoV-2 virus that causes COVID-19. Institutional protocols and algorithms that pertain to the evaluation of patients at risk for COVID-19 are in a state of rapid change based on information released by regulatory bodies including the CDC and federal and state organizations. These policies and algorithms were followed during the patient's care in the ED.  Some ED evaluations and interventions may be delayed as a result of limited staffing during the pandemic.   ____________________________________________   FINAL CLINICAL IMPRESSION(S) / ED DIAGNOSES   Final diagnoses:  Suspected COVID-19 virus infection      NEW MEDICATIONS STARTED DURING THIS VISIT:  ED Discharge Orders         Ordered    predniSONE ([place]) 20  MG tablet  Daily     10/07/19 0905           Note:  This document was prepared using Dragon voice recognition software and may include unintentional dictation errors.    Don Perking, [place], MD 10/07/19 [phone]

## 2019-10-07 NOTE — Discharge Instructions (Addendum)
Your coronavirus test was negative but I am still concerned that you may have coronavirus.  You stay quarantined at home.  We are giving you some steroids.  You can use your albuterol inhalers every 4-6 hours to help with shortness of breath.  Return to the ER if you develop worsening shortness of breath or any other concerns   IMPRESSION:  Negative for significant acute pulmonary embolus by CTA.   Stable right upper lobe and right middle lobe nodules with areas of  parenchymal scarring dating back to 2015. Associated calcified  mediastinal and hilar granulomas. Findings compatible with chronic  sarcoidosis. No significant change or interval progression.   No other superimposed acute chest process   Bibasilar atelectasis

## 2019-10-07 NOTE — ED Notes (Signed)
Resumed care from [place], [place]. Pt resting, states her pain is improved. NAD noted.

## 2019-10-07 NOTE — ED Provider Notes (Signed)
7:55 AM Assumed care for off going team.   Blood pressure (!) 128/95, pulse (!) 134, temperature 99.2 F (37.3 C), temperature source Oral, resp. rate 20, height 5\' 6"  (1.676 m), weight 73.5 kg, SpO2 98 %.  See their HPI for full report but in brief covid like symptoms, h/o sarcoidosis, sats 97%, got duoneb, steroids. POC negative. 2 hour test pending, labs.  Plan to d/c on steroids/inhalers if work-up is reassuring.  Interesting her coronavirus test was negative.  Patient still tachycardic to 110-1 15.  Although the D-dimer is negative I did discuss with patient that no test is perfect.  Given the continued tachycardia with not a great answer for why she is feeling short of breath I think it be reasonable to get a CT PE to rule out any other acute pathology.  If negative will send the 6 to 24-hour Covid test and treat her as a sarcoidosis flare  CT PE:  IMPRESSION:  Negative for significant acute pulmonary embolus by CTA.   Stable right upper lobe and right middle lobe nodules with areas of  parenchymal scarring dating back to 2015. Associated calcified  mediastinal and hilar granulomas. Findings compatible with chronic  sarcoidosis. No significant change or interval progression.   No other superimposed acute chest process   Bibasilar atelectasis   Reevaluated patient.  Heart rates come down to the 110s.  Patient states that in the past her heart rates have been slightly elevated in the low 100s to upper 90s.  This is not super abnormal for her.  Patient is feeling better after the fluids.  She already ambulated with the prior team and did not desaturate.  Discussed with patient that she can still have coronavirus and the test could just be a false negative.  Recommended quarantine.  Will give some steroids and encouraged her albuterol.  Discussed with patient that she can return for worsening shortness of breath or any other concerns  I discussed the provisional nature of ED diagnosis, the  treatment so far, the ongoing plan of care, follow up appointments and return precautions with the patient and any family or support people present. They expressed understanding and agreed with the plan, discharged home.       2016, MD 10/07/19 [phone]

## 2019-10-07 NOTE — ED Triage Notes (Signed)
Patient to ED for increasing shortness of breath, body aches and chills since yesterday. Patient is a Research scientist (physical sciences).

## 2019-10-07 NOTE — ED Notes (Signed)
Pt discharged home after verbalizing understanding of discharge instructions; nad noted. 

## 2019-10-09 ENCOUNTER — Encounter: Payer: Self-pay | Admitting: Adult Health

## 2019-10-09 ENCOUNTER — Ambulatory Visit: Payer: 59 | Admitting: Adult Health

## 2019-10-09 VITALS — Temp 97.4°F

## 2019-10-09 DIAGNOSIS — D860 Sarcoidosis of lung: Secondary | ICD-10-CM

## 2019-10-09 DIAGNOSIS — R5383 Other fatigue: Secondary | ICD-10-CM | POA: Diagnosis not present

## 2019-10-09 DIAGNOSIS — R0602 Shortness of breath: Secondary | ICD-10-CM | POA: Diagnosis not present

## 2019-10-09 DIAGNOSIS — I10 Essential (primary) hypertension: Secondary | ICD-10-CM | POA: Diagnosis not present

## 2019-10-09 MED ORDER — ALBUTEROL SULFATE (2.5 MG/3ML) 0.083% IN NEBU: 2.5000 mg | INHALATION_SOLUTION | Freq: Four times a day (QID) | RESPIRATORY_TRACT | 1 refills | Status: DC | PRN

## 2019-10-09 NOTE — Progress Notes (Signed)
[facility] [address] [place], [place] [address]  Internal MEDICINE  Telephone Visit  Patient Name: Brittany Huynh  [id number]  [id number]  Date of Service: 10/09/2019  I connected with the patient at 923 by telephone and verified the patients identity using two identifiers.   I discussed the limitations, risks, security and privacy concerns of performing an evaluation and management service by telephone and the availability of in person appointments. I also discussed with the patient that there may be a patient responsible charge related to the service.  The patient expressed understanding and agrees to proceed.    Chief Complaint  Patient presents with  . Telephone Assessment  . Telephone Screen  . Sinusitis  . Cough  . Hospitalization Follow-up    HPI  PT seen via video today for hospital follow up. She was complaining of increasing sob, body aches and chills. She was evaluated for covid three times. A EKG and a CTA were done which were negative for PE or other abnormalities.  She was sent home with prednisone dose pack, and told to use otc mucinex.  She reports she continues to have sore throat and cough.  She is 3 days into her prednisone pack, and she does feel some what better.  She does not have body aches, however she still has hot and cold flashes.      Current Medication: Outpatient Encounter Medications as of 10/09/2019  Medication Sig  . albuterol (PROVENTIL HFA;VENTOLIN HFA) 108 (90 Base) MCG/ACT inhaler Inhale 2 puffs into the lungs every 6 (six) hours as needed for wheezing or shortness of breath.  Marland Kitchen alendronate (FOSAMAX) 70 MG tablet Take 1 tablet (70 mg total) by mouth every 7 (seven) days. Take with a full glass of water on an empty stomach.  Marland Kitchen aspirin-acetaminophen-caffeine (EXCEDRIN MIGRAINE) 250-250-65 MG per tablet Take 1 tablet by mouth every 6 (six) hours as needed for headache.   . augmented betamethasone dipropionate (DIPROLENE-AF) 0.05 %  ointment APPLY TO AFFECTED AREA TWICE A DAY (Patient taking differently: Apply 1 application topically 2 (two) times daily. )  . celecoxib (CELEBREX) 200 MG capsule Take 1 capsule (200 mg total) by mouth 2 (two) times daily. Take one tab a day for inflammation (Patient taking differently: Take 200 mg by mouth daily. )  . dicyclomine (BENTYL) 10 MG capsule TAKE 1 CAPSULE BY MOUTH EVERY DAY (Patient taking differently: Take 10 mg by mouth daily. )  . DULoxetine (CYMBALTA) 30 MG capsule Take 1 capsule (30 mg total) by mouth daily.  . mirtazapine (REMERON) 30 MG tablet Take 1 tablet (30 mg total) by mouth at bedtime.  . predniSONE (DELTASONE) 1 MG tablet PT is tapering dose, currently taking 9mg  weekly and will step down each week by 1mg . (Patient taking differently: Take 8 mg by mouth daily. )  . predniSONE (DELTASONE) 20 MG tablet Take 2 tablets (40 mg total) by mouth daily for 5 days.  . Vitamin D, Ergocalciferol, (DRISDOL) 1.25 MG (50000 UT) CAPS capsule Take 1 capsule (50,000 Units total) by mouth every 7 (seven) days.  zolpidem (AMBIEN) 10 MG tablet Take 1 tablet (10 mg total) by mouth at bedtime as needed for sleep.   No facility-administered encounter medications on file as of 10/09/2019.    Surgical History: Past Surgical History:  Procedure Laterality Date  . ABDOMINAL HYSTERECTOMY  2009  . BREAST BIOPSY Left 02/15/2018   PREDOMINANTLY MATURE ADIPOSE TISSUE WITH VERY FOCAL AREAS OF BENIGN   .  chamberlain procedure   2011  . IRRIGATION AND DEBRIDEMENT HEMATOMA Left 03/07/2018   Procedure: IRRIGATION AND DEBRIDEMENT HEMATOMA-LEFT BREAST;  Surgeon: Robert Bellow, MD;  Location: [facility] ORS;  Service: General;  Laterality: Left;  . LUNG BIOPSY  2011  . RESECTION RIBS EXTRAPLEURAL  [id number]    Medical History: Past Medical History:  Diagnosis Date  . Anxiety   . Asthma   . Breast fibrocystic disorder 2013  . Hernia 2011   chest wall  . Lung mass 2011  . Sarcoidosis   . Shortness  of breath    2011    Family History: Family History  Problem Relation Age of Onset  . Liver cancer Mother 26  . Bone cancer Maternal Aunt 60  . Breast cancer Maternal Aunt   . Breast cancer Paternal Aunt   . Bone cancer Paternal Aunt 79    Social History   Socioeconomic History  . Marital status: Single    Spouse name: Not on file  . Number of children: Not on file  . Years of education: Not on file  . Highest education level: Not on file  Occupational History  . Not on file  Tobacco Use  . Smoking status: Never Smoker  . Smokeless tobacco: Never Used  Substance and Sexual Activity  . Alcohol use: Yes    Comment: on occassion  . Drug use: No  . Sexual activity: Not on file  Other Topics Concern  . Not on file  Social History Narrative  . Not on file   Social Determinants of Health   Financial Resource Strain:   . Difficulty of Paying Living Expenses: Not on file  Food Insecurity:   . Worried About Charity fundraiser in the Last Year: Not on file  . Ran Out of Food in the Last Year: Not on file  Transportation Needs:   . Lack of Transportation (Medical): Not on file  . Lack of Transportation (Non-Medical): Not on file  Physical Activity:   . Days of Exercise per Week: Not on file  . Minutes of Exercise per Session: Not on file  Stress:   . Feeling of Stress : Not on file  Social Connections:   . Frequency of Communication with Friends and Family: Not on file  . Frequency of Social Gatherings with Friends and Family: Not on file  . Attends Religious Services: Not on file  . Active Member of Clubs or Organizations: Not on file  . Attends Archivist Meetings: Not on file  . Marital Status: Not on file  Intimate Partner Violence:   . Fear of Current or Ex-Partner: Not on file  . Emotionally Abused: Not on file  . Physically Abused: Not on file  . Sexually Abused: Not on file      Review of Systems  Constitutional: Positive for chills and  fatigue. Negative for unexpected weight change.  HENT: Negative for congestion, rhinorrhea, sneezing and sore throat.   Eyes: Negative for photophobia, pain and redness.  Respiratory: Positive for cough. Negative for chest tightness and shortness of breath.   Cardiovascular: Negative for chest pain and palpitations.  Gastrointestinal: Negative for abdominal pain, constipation, diarrhea, nausea and vomiting.  Endocrine: Negative.   Genitourinary: Negative for dysuria and frequency.  Musculoskeletal: Negative for arthralgias, back pain, joint swelling and neck pain.  Skin: Negative for rash.  Allergic/Immunologic: Negative.   Neurological: Negative for tremors and numbness.  Hematological: Negative for adenopathy. Does not bruise/bleed easily.  Psychiatric/Behavioral:  Negative for behavioral problems and sleep disturbance. The patient is not nervous/anxious.     Vital Signs: Temp (!) 97.4 F (36.3 C)    Observation/Objective:  Ill appearing, NAD noted.   Assessment/Plan: 1. 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  Pt is feeling better with steroid pack.  Complete medications, continue to use inhalers.  Will get patient a nebulizer that she may due daily for a few days, and then use as needed.  Continue to use otc mucinex.  - For home use only DME Nebulizer machine  2. Shortness of breath Resolved, patient back to baseline  3. Hypertension, unspecified type Stable, continue medications as directed.  4. Fatigue, unspecified type Residual from sarcoid flare.  encouraged patient to rest, drink plenty of fluids.  General Counseling: anju sereno understanding of the findings of today's phone visit and agrees with plan of treatment. I have discussed any further diagnostic evaluation that may be needed or ordered today. We also reviewed her medications today. she has been encouraged to call the office with any questions or concerns that should arise related to todays  visit.    Orders Placed This Encounter  Procedures  . For home use only DME Nebulizer machine    No orders of the defined types were placed in this encounter.   Time spent: 20 Minutes    Blima Ledger [facility] Internal medicine

## 2019-10-15 ENCOUNTER — Other Ambulatory Visit: Payer: Self-pay | Admitting: Internal Medicine

## 2019-10-15 DIAGNOSIS — M2550 Pain in unspecified joint: Secondary | ICD-10-CM

## 2019-10-15 NOTE — Telephone Encounter (Signed)
Pt needs to take Celebrex 200 mg po qd NOT bid

## 2019-12-26 ENCOUNTER — Other Ambulatory Visit: Payer: Self-pay | Admitting: Internal Medicine

## 2020-01-07 ENCOUNTER — Encounter: Payer: Self-pay | Admitting: Emergency Medicine

## 2020-01-07 ENCOUNTER — Other Ambulatory Visit: Payer: Self-pay

## 2020-01-07 ENCOUNTER — Emergency Department: Payer: 59

## 2020-01-07 ENCOUNTER — Emergency Department
Admission: EM | Admit: 2020-01-07 | Discharge: 2020-01-07 | Disposition: A | Discharge status: Home / Self Care | Payer: 59 | Attending: Emergency Medicine | Admitting: Emergency Medicine

## 2020-01-07 DIAGNOSIS — J45909 Unspecified asthma, uncomplicated: Secondary | ICD-10-CM | POA: Diagnosis not present

## 2020-01-07 DIAGNOSIS — D869 Sarcoidosis, unspecified: Secondary | ICD-10-CM

## 2020-01-07 DIAGNOSIS — N644 Mastodynia: Secondary | ICD-10-CM | POA: Diagnosis not present

## 2020-01-07 LAB — URINALYSIS, COMPLETE (UACMP) WITH MICROSCOPIC
Bacteria, UA: NONE SEEN
Bilirubin Urine: NEGATIVE
Glucose, UA: NEGATIVE mg/dL
Hgb urine dipstick: NEGATIVE
Ketones, ur: NEGATIVE mg/dL
Leukocytes,Ua: NEGATIVE
Nitrite: NEGATIVE
Protein, ur: NEGATIVE mg/dL
Specific Gravity, Urine: 1.029 (ref 1.005–1.030)
pH: 5 (ref 5.0–8.0)

## 2020-01-07 LAB — COMPREHENSIVE METABOLIC PANEL
ALT: 18 U/L (ref 0–44)
AST: 20 U/L (ref 15–41)
Albumin: 4 g/dL (ref 3.5–5.0)
Alkaline Phosphatase: 93 U/L (ref 38–126)
Anion gap: 7 (ref 5–15)
BUN: 20 mg/dL (ref 6–20)
CO2: 26 mmol/L (ref 22–32)
Calcium: 9.4 mg/dL (ref 8.9–10.3)
Chloride: 107 mmol/L (ref 98–111)
Creatinine, Ser: 0.74 mg/dL (ref 0.44–1.00)
GFR calc Af Amer: >60 mL/min (ref >60)
GFR calc non Af Amer: >60 mL/min (ref >60)
Glucose, Bld: 85 mg/dL (ref 70–99)
Potassium: 3.9 mmol/L (ref 3.5–5.1)
Sodium: 140 mmol/L (ref 135–145)
Total Bilirubin: 0.6 mg/dL (ref 0.3–1.2)
Total Protein: 7.4 g/dL (ref 6.5–8.1)

## 2020-01-07 LAB — CBC
HCT: 40.6 % (ref 36.0–46.0)
Hemoglobin: 13 g/dL (ref 12.0–15.0)
MCH: 27.2 pg (ref 26.0–34.0)
MCHC: 32 g/dL (ref 30.0–36.0)
MCV: 84.9 fL (ref 80.0–100.0)
Platelets: 245 10*3/uL (ref 150–400)
RBC: 4.78 MIL/uL (ref 3.87–5.11)
RDW: 14.1 % (ref 11.5–15.5)
WBC: 7.6 10*3/uL (ref 4.0–10.5)
nRBC: 0 % (ref 0.0–0.2)

## 2020-01-07 LAB — TROPONIN I (HIGH SENSITIVITY): Troponin I (High Sensitivity): <2 ng/L (ref <18)

## 2020-01-07 LAB — LIPASE, BLOOD: Lipase: 23 U/L (ref 11–51)

## 2020-01-07 MED ORDER — OXYCODONE-ACETAMINOPHEN 5-325 MG PO TABS: 1.0000 | ORAL_TABLET | Freq: Two times a day (BID) | ORAL | 0 refills | Status: DC | PRN

## 2020-01-07 MED ORDER — PREDNISONE 50 MG PO TABS
ORAL_TABLET | ORAL | 0 refills | Status: DC
Start: 2020-01-07 — End: 2020-04-20

## 2020-01-07 NOTE — ED Triage Notes (Signed)
Pt arrived via POV with reports of bilateral breast pain and upper abdominal pain since last week.  Pt c/o nausea yesterday, denies and vomiting or diarrhea.   Pt c/o feeling like abdomen is swollen.  Pt states she has bilateral enlarged lymph nodes under axillae.

## 2020-01-07 NOTE — ED Provider Notes (Signed)
[facility] Emergency Department Provider Note       Time seen: ----------------------------------------- 4:11 PM on 01/07/2020 -----------------------------------------  I have reviewed the triage vital signs and the nursing notes.  HISTORY   Chief Complaint Breast Pain and Abdominal Pain    HPI Brittany Huynh is a 54 y.o. female with a history of anxiety, asthma, fibrocystic breast disease, hernia, lung mass, sarcoidosis who presents to the ED for multiple complaints.  Patient reports bilateral breast pain and upper abdominal pain since last week.  She complains of nausea yesterday, denies vomiting or diarrhea.  Abdomen feels swollen.  She states she has enlarged lymph nodes under her axilla bilaterally.  Discomfort is 6 out of 10.  Past Medical History:  Diagnosis Date  . Anxiety   . Asthma   . Breast fibrocystic disorder 2013  . Hernia 2011   chest wall  . Lung mass 2011  . Sarcoidosis   . Shortness of breath    2011    Patient Active Problem List   Diagnosis Date Noted  . Asthma 12/06/2018  . Other atopic dermatitis 04/07/2018  . Breast pain, left 02/28/2018  . Essential hypertension 01/14/2018  . Other chest pain 01/14/2018  . Fatigue 01/14/2018  . Polyneuropathic pain 01/14/2018  . Acute vaginitis 01/14/2018  . Unspecified menopausal and perimenopausal disorder 01/14/2018  . Vitamin D deficiency 01/14/2018  . Dysuria 01/14/2018  . History of chickenpox 11/27/2017  . Shingles 11/27/2017  . 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 Shortness of breath 09/15/2017  . Edema 09/15/2017  . Recurrent major depressive disorder (HCC) 09/15/2017  . Migraine 09/15/2017  . Inflammatory polyarthritis (HCC) 09/15/2017  . Myopathy 09/15/2017  . Long term current use of systemic steroids 09/15/2017  . Tachycardia, unspecified 09/15/2017  . Gastro-esophageal reflux disease without esophagitis 09/15/2017  . Bacterial intestinal infection  09/15/2017  . Hypersomnia, unspecified 09/15/2017  . Iron deficiency anemia 09/15/2017  . Neuralgia and neuritis 09/15/2017  . Restless leg syndrome 09/15/2017  . Generalized anxiety disorder 09/15/2017  . Fibrocystic breast disease 04/05/2013  . Screening for breast cancer 04/05/2013  . Posttraumatic hematoma of left breast 04/05/2013    Past Surgical History:  Procedure Laterality Date  . ABDOMINAL HYSTERECTOMY  2009  . BREAST BIOPSY Left 02/15/2018   PREDOMINANTLY MATURE ADIPOSE TISSUE WITH VERY FOCAL AREAS OF BENIGN   . chamberlain procedure   2011  . IRRIGATION AND DEBRIDEMENT HEMATOMA Left 03/07/2018   Procedure: IRRIGATION AND DEBRIDEMENT HEMATOMA-LEFT BREAST;  Surgeon: Earline Mayotte, MD;  Location: [facility] ORS;  Service: General;  Laterality: Left;  . LUNG BIOPSY  2011  . RESECTION RIBS EXTRAPLEURAL  [id number]    Allergies Onion and Adhesive [tape]  Social History Social History   Tobacco Use  . Smoking status: Never Smoker  . Smokeless tobacco: Never Used  Substance Use Topics  . Alcohol use: Yes    Comment: on occassion  . Drug use: No    Review of Systems Constitutional: Negative for fever. Cardiovascular: Negative for chest pain. Respiratory: Negative for shortness of breath. Gastrointestinal: Positive for abdominal pain, nausea Musculoskeletal: Negative for back pain.  Positive for breast pain Skin: Negative for rash.  Positive for adenopathy Neurological: Negative for headaches, focal weakness or numbness.  All systems negative/normal/unremarkable except as stated in the HPI  ____________________________________________   PHYSICAL EXAM:  VITAL SIGNS: ED Triage Vitals  Enc Vitals Group     BP 01/07/20 1511 (!) 155/96     Pulse  Rate 01/07/20 1511 (!) 106     Resp 01/07/20 1511 16     Temp 01/07/20 1511 98 F (36.7 C)     Temp Source 01/07/20 1511 Oral     SpO2 01/07/20 1511 100 %     Weight 01/07/20 1511 150 lb (68 kg)     Height 01/07/20 1511  5' 6.5" (1.689 m)     Head Circumference --      Peak Flow --      Pain Score 01/07/20 1520 6     Pain Loc --      Pain Edu? --      Excl. in GC? --     Constitutional: Alert and oriented. Well appearing and in no distress. Eyes: Conjunctivae are normal. Normal extraocular movements. Cardiovascular: Normal rate, regular rhythm. No murmurs, rubs, or gallops. Respiratory: Normal respiratory effort without tachypnea nor retractions. Breath sounds are clear and equal bilaterally. No wheezes/rales/rhonchi. Gastrointestinal: Soft and nontender. Normal bowel sounds Musculoskeletal: Nontender with normal range of motion in extremities. No lower extremity tenderness nor edema.  Mild bilateral adenopathy is noted Neurologic:  Normal speech and language. No gross focal neurologic deficits are appreciated.  Skin:  Skin is warm, dry and intact. No rash noted. Psychiatric: Mood and affect are normal. Speech and behavior are normal.  ____________________________________________  EKG: Interpreted by me.  Sinus rhythm with rate of 91 bpm, normal PR interval, normal QRS, normal QT  ____________________________________________  ED COURSE:  As part of my medical decision making, I reviewed the following data within the electronic MEDICAL RECORD NUMBER History obtained from family if available, nursing notes, old chart and ekg, as well as notes from prior ED visits. Patient presented for multiple complaints, we will assess with labs and imaging as indicated at this time.   Procedures  Brittany Huynh was evaluated in Emergency Department on 01/07/2020 for the symptoms described in the history of present illness. She was evaluated in the context of the global COVID-19 pandemic, which necessitated consideration that the patient might be at risk for infection with the SARS-CoV-2 virus that causes COVID-19. Institutional protocols and algorithms that pertain to the evaluation of patients at risk for COVID-19 are in  a state of rapid change based on information released by regulatory bodies including the CDC and federal and state organizations. These policies and algorithms were followed during the patient's care in the ED.  ____________________________________________   LABS (pertinent positives/negatives)  Labs Reviewed  URINALYSIS, COMPLETE (UACMP) WITH MICROSCOPIC - Abnormal; Notable for the following components:      Result Value   Color, Urine YELLOW (*)    APPearance HAZY (*)    All other components within normal limits  LIPASE, BLOOD  COMPREHENSIVE METABOLIC PANEL  CBC  TROPONIN I (HIGH SENSITIVITY)    RADIOLOGY Images were viewed by me  Acute abdominal series IMPRESSION: Negative abdominal radiographs.  No acute cardiopulmonary disease. ____________________________________________   DIFFERENTIAL DIAGNOSIS   Sarcoidosis, chronic pain, GERD, gastritis, gas pain, adenitis  FINAL ASSESSMENT AND PLAN  Sarcoidosis   Plan: The patient had presented for multiple complaints. Patient's labs are unremarkable. Patient's imaging was reassuring.  Symptoms likely secondary to sarcoidosis.  I will encourage her to take a steroid burst and then return to her normal dosing.  She is cleared for outpatient follow-up.   Ulice Dash, MD    Note: This note was generated in part or whole with voice recognition software. Voice recognition is usually quite accurate but  there are transcription errors that can and very often do occur. I apologize for any typographical errors that were not detected and corrected.     Earleen Newport, MD 01/07/20 1750

## 2020-01-07 NOTE — ED Notes (Signed)
Pt reports bilateral breast pain x 1 week. Pt states that she feel like her lymph nodes are swollen. Pt is also c/o abdominal tenderness when palpated. Pt reports some nausea yesterday but denies vomiting. Pt reports last BM was yesterday and was normal for her. Pt c/o fatigue. States that she has been having symptoms for about 1 week. Pt is in NAD. Pt was able to ambulate to treatment room without difficulty or distress.

## 2020-01-07 NOTE — ED Notes (Signed)
Pt signed paper discharge form 

## 2020-01-07 NOTE — ED Notes (Signed)
Pt transported to xray 

## 2020-01-16 ENCOUNTER — Telehealth: Payer: Self-pay

## 2020-01-16 NOTE — Telephone Encounter (Signed)
Confirmed appointment on 01/20/2020 and screened for covid. [facility] 

## 2020-01-20 ENCOUNTER — Ambulatory Visit: Payer: 59 | Admitting: Adult Health

## 2020-01-20 ENCOUNTER — Encounter: Payer: Self-pay | Admitting: Adult Health

## 2020-01-20 VITALS — BP 137/83 | HR 99 | Temp 97.5°F | Resp 16 | Ht 66.0 in | Wt 156.4 lb

## 2020-01-20 DIAGNOSIS — T50905A Adverse effect of unspecified drugs, medicaments and biological substances, initial encounter: Secondary | ICD-10-CM | POA: Diagnosis not present

## 2020-01-20 DIAGNOSIS — M818 Other osteoporosis without current pathological fracture: Secondary | ICD-10-CM | POA: Diagnosis not present

## 2020-01-20 DIAGNOSIS — M2550 Pain in unspecified joint: Secondary | ICD-10-CM

## 2020-01-20 DIAGNOSIS — G4700 Insomnia, unspecified: Secondary | ICD-10-CM

## 2020-01-20 DIAGNOSIS — R69 Illness, unspecified: Secondary | ICD-10-CM | POA: Diagnosis not present

## 2020-01-20 DIAGNOSIS — D860 Sarcoidosis of lung: Secondary | ICD-10-CM | POA: Diagnosis not present

## 2020-01-20 DIAGNOSIS — F339 Major depressive disorder, recurrent, unspecified: Secondary | ICD-10-CM | POA: Diagnosis not present

## 2020-01-20 DIAGNOSIS — Z1231 Encounter for screening mammogram for malignant neoplasm of breast: Secondary | ICD-10-CM | POA: Diagnosis not present

## 2020-01-20 MED ORDER — DULOXETINE HCL 30 MG PO CPEP: 30.0000 mg | ORAL_CAPSULE | Freq: Every day | ORAL | 3 refills | Status: DC

## 2020-01-20 MED ORDER — MIRTAZAPINE 30 MG PO TABS: 30.0000 mg | ORAL_TABLET | Freq: Every day | ORAL | 0 refills | Status: DC

## 2020-01-20 MED ORDER — PREDNISONE 10 MG PO TABS: 10.0000 mg | ORAL_TABLET | Freq: Every day | ORAL | 3 refills | Status: DC

## 2020-01-20 MED ORDER — ALENDRONATE SODIUM 70 MG PO TABS: 70.0000 mg | ORAL_TABLET | ORAL | 11 refills | Status: DC

## 2020-01-20 MED ORDER — CELECOXIB 200 MG PO CAPS: 200.0000 mg | ORAL_CAPSULE | Freq: Every day | ORAL | 3 refills | Status: DC

## 2020-01-20 MED ORDER — PREDNISONE 10 MG PO TABS: ORAL_TABLET | ORAL | 0 refills | Status: DC

## 2020-01-20 MED ORDER — ZOLPIDEM TARTRATE 10 MG PO TABS: 10.0000 mg | ORAL_TABLET | Freq: Every evening | ORAL | 1 refills | Status: DC | PRN

## 2020-01-20 MED ORDER — DICYCLOMINE HCL 10 MG PO CAPS: ORAL_CAPSULE | ORAL | 0 refills | Status: DC

## 2020-01-20 MED ORDER — ALBUTEROL SULFATE HFA 108 (90 BASE) MCG/ACT IN AERS: 2.0000 | INHALATION_SPRAY | Freq: Four times a day (QID) | RESPIRATORY_TRACT | 3 refills | Status: DC | PRN

## 2020-01-20 NOTE — Progress Notes (Signed)
[facility] [address] [place], [place] [address]  Internal MEDICINE  Office Visit Note  Patient Name: Brittany Huynh  [id number]  [id number]  Date of Service: 01/20/2020  Chief Complaint  Patient presents with  . Hospitalization Follow-up    labs and test results, abdominal pain, slight chest pain, frequent urination, frequent bowel movements  . Fatigue    very tired  . Medication Refill    HPI  Pt is here following up on labs that were drawn, and also because she was seen in the ED at [facility].  She was having breast and chest pain/discomfort.  Ultimately it was decided that she was having a sarcoidosis flare, and she was sent home with prednisone.  She continues to report some breast tenderness, swollen axillar lymph nodes, and abdominal pain.   Current Medication: Outpatient Encounter Medications as of 01/20/2020  Medication Sig  . albuterol (PROVENTIL HFA;VENTOLIN HFA) 108 (90 Base) MCG/ACT inhaler Inhale 2 puffs into the lungs every 6 (six) hours as needed for wheezing or shortness of breath.  Marland Kitchen albuterol (PROVENTIL) (2.5 MG/3ML) 0.083% nebulizer solution Take 3 mLs (2.5 mg total) by nebulization every 6 (six) hours as needed for wheezing or shortness of breath.  Marland Kitchen alendronate (FOSAMAX) 70 MG tablet Take 1 tablet (70 mg total) by mouth every 7 (seven) days. Take with a full glass of water on an empty stomach.  Marland Kitchen aspirin-acetaminophen-caffeine (EXCEDRIN MIGRAINE) 250-250-65 MG per tablet Take 1 tablet by mouth every 6 (six) hours as needed for headache.   . augmented betamethasone dipropionate (DIPROLENE-AF) 0.05 % ointment APPLY TO AFFECTED AREA TWICE A DAY (Patient taking differently: Apply 1 application topically 2 (two) times daily. )  . celecoxib (CELEBREX) 200 MG capsule Take 1 capsule (200 mg total) by mouth daily.  Marland Kitchen dicyclomine (BENTYL) 10 MG capsule TAKE 1 CAPSULE BY MOUTH EVERY DAY (Patient taking differently: Take 10 mg by mouth daily. )  .  DULoxetine (CYMBALTA) 30 MG capsule Take 1 capsule (30 mg total) by mouth daily.  . mirtazapine (REMERON) 30 MG tablet Take 1 tablet (30 mg total) by mouth at bedtime.  Marland Kitchen oxyCODONE-acetaminophen (PERCOCET) 5-325 MG tablet Take 1 tablet by mouth 2 (two) times daily as needed.  . predniSONE (DELTASONE) 1 MG tablet PT is tapering dose, currently taking 9mg  weekly and will step down each week by 1mg . (Patient taking differently: Take 8 mg by mouth daily. )  . predniSONE (DELTASONE) 50 MG tablet Take 1 tablet by mouth daily  . Vitamin D, Ergocalciferol, (DRISDOL) 1.25 MG (50000 UT) CAPS capsule Take 1 capsule (50,000 Units total) by mouth every 7 (seven) days.  zolpidem (AMBIEN) 10 MG tablet Take 1 tablet (10 mg total) by mouth at bedtime as needed for sleep.   No facility-administered encounter medications on file as of 01/20/2020.    Surgical History: Past Surgical History:  Procedure Laterality Date  . ABDOMINAL HYSTERECTOMY  2009  . BREAST BIOPSY Left 02/15/2018   PREDOMINANTLY MATURE ADIPOSE TISSUE WITH VERY FOCAL AREAS OF BENIGN   . chamberlain procedure   2011  . IRRIGATION AND DEBRIDEMENT HEMATOMA Left 03/07/2018   Procedure: IRRIGATION AND DEBRIDEMENT HEMATOMA-LEFT BREAST;  Surgeon: 2012, MD;  Location: [facility] ORS;  Service: General;  Laterality: Left;  . LUNG BIOPSY  2011  . RESECTION RIBS EXTRAPLEURAL  Earline [place]    Medical History: Past Medical History:  Diagnosis Date  . Anxiety   . Asthma   . Breast fibrocystic disorder  2013  . Hernia 2011   chest wall  . Lung mass 2011  . Sarcoidosis   . Shortness of breath    2011    Family History: Family History  Problem Relation Age of Onset  . Liver cancer Mother 65  . Bone cancer Maternal Aunt 60  . Breast cancer Maternal Aunt   . Breast cancer Paternal Aunt   . Bone cancer Paternal Aunt 35    Social History   Socioeconomic History  . Marital status: Single    Spouse name: Not on file  . Number of children:  Not on file  . Years of education: Not on file  . Highest education level: Not on file  Occupational History  . Not on file  Tobacco Use  . Smoking status: Never Smoker  . Smokeless tobacco: Never Used  Substance and Sexual Activity  . Alcohol use: Yes    Comment: on occassion  . Drug use: No  . Sexual activity: Not on file  Other Topics Concern  . Not on file  Social History Narrative  . Not on file   Social Determinants of Health   Financial Resource Strain:   . Difficulty of Paying Living Expenses:   Food Insecurity:   . Worried About Programme researcher, broadcasting/film/video in the Last Year:   . Barista in the Last Year:   Transportation Needs:   . Freight forwarder (Medical):   Marland Kitchen Lack of Transportation (Non-Medical):   Physical Activity:   . Days of Exercise per Week:   . Minutes of Exercise per Session:   Stress:   . Feeling of Stress :   Social Connections:   . Frequency of Communication with Friends and Family:   . Frequency of Social Gatherings with Friends and Family:   . Attends Religious Services:   . Active Member of Clubs or Organizations:   . Attends Banker Meetings:   Marland Kitchen Marital Status:   Intimate Partner Violence:   . Fear of Current or Ex-Partner:   . Emotionally Abused:   Marland Kitchen Physically Abused:   . Sexually Abused:       Review of Systems  Constitutional: Negative for chills, fatigue and unexpected weight change.  HENT: Negative for congestion, rhinorrhea, sneezing and sore throat.   Eyes: Negative for photophobia, pain and redness.  Respiratory: Negative for cough, chest tightness and shortness of breath.   Cardiovascular: Negative for chest pain and palpitations.  Gastrointestinal: Negative for abdominal pain, constipation, diarrhea, nausea and vomiting.  Endocrine: Negative.   Genitourinary: Negative for dysuria and frequency.  Musculoskeletal: Negative for arthralgias, back pain, joint swelling and neck pain.  Skin: Negative for  rash.  Allergic/Immunologic: Negative.   Neurological: Negative for tremors and numbness.  Hematological: Negative for adenopathy. Does not bruise/bleed easily.  Psychiatric/Behavioral: Negative for behavioral problems and sleep disturbance. The patient is not nervous/anxious.     Vital Signs: BP 137/83   Pulse 99   Temp (!) 97.5 F (36.4 C)   Resp 16   Ht 5\' 6"  (1.676 m)   Wt 156 lb 6.4 oz (70.9 kg)   SpO2 97%   BMI 25.24 kg/m    Physical Exam Vitals and nursing note reviewed.  Constitutional:      General: She is not in acute distress.    Appearance: She is well-developed. She is not diaphoretic.  HENT:     Head: Normocephalic and atraumatic.     Mouth/Throat:  Pharynx: No oropharyngeal exudate.  Eyes:     Pupils: Pupils are equal, round, and reactive to light.  Neck:     Thyroid: No thyromegaly.     Vascular: No JVD.     Trachea: No tracheal deviation.  Cardiovascular:     Rate and Rhythm: Normal rate and regular rhythm.     Heart sounds: Normal heart sounds. No murmur. No friction rub. No gallop.   Pulmonary:     Effort: Pulmonary effort is normal. No respiratory distress.     Breath sounds: Normal breath sounds. No wheezing or rales.  Chest:     Chest wall: No tenderness.  Abdominal:     Palpations: Abdomen is soft.     Tenderness: There is no abdominal tenderness. There is no guarding.  Musculoskeletal:        General: Normal range of motion.     Cervical back: Normal range of motion and neck supple.  Lymphadenopathy:     Cervical: No cervical adenopathy.  Skin:    General: Skin is warm and dry.  Neurological:     Mental Status: She is alert and oriented to person, place, and time.     Cranial Nerves: No cranial nerve deficit.  Psychiatric:        Behavior: Behavior normal.        Thought Content: Thought content normal.        Judgment: Judgment normal.    Assessment/Plan: 1. Encounter for screening mammogram for malignant neoplasm of  breast Have screening mammogram. - MM DIGITAL SCREENING BILATERAL; Future  2. Insomnia, unspecified type Use ambien as directed.   - zolpidem (AMBIEN) 10 MG tablet; Take 1 tablet (10 mg total) by mouth at bedtime as needed for sleep.  Dispense: 30 tablet; Refill: 1  3. Episode of recurrent major depressive disorder, unspecified depression episode severity ([place]) Continue to use remeron as before. Good control of symptoms  - mirtazapine (REMERON) 30 MG tablet; Take 1 tablet (30 mg total) by mouth at bedtime.  Dispense: 90 tablet; Refill: 0  4. Diffuse arthralgia Continue celebrex as discussed.  - celecoxib (CELEBREX) 200 MG capsule; Take 1 capsule (200 mg total) by mouth daily.  Dispense: 90 capsule; Refill: 3  5. Drug-induced osteoporosis Continue fosamax as directed.  - alendronate (FOSAMAX) 70 MG tablet; Take 1 tablet (70 mg total) by mouth every 7 (seven) days. Take with a full glass of water on an empty stomach.  Dispense: 4 tablet; Refill: 11  6. Sarcoidosis of lung ([place]) Continue current management. Use prednisone as directed.  - predniSONE (DELTASONE) 10 MG tablet; Take 1 tablet (10 mg total) by mouth daily with breakfast.  Dispense: 30 tablet; Refill: 3 - albuterol (VENTOLIN HFA) 108 (90 Base) MCG/ACT inhaler; Inhale 2 puffs into the lungs every 6 (six) hours as needed for wheezing or shortness of breath.  Dispense: 18 g; Refill: 3  General Counseling: Brittany Huynh verbalizes understanding of the findings of todays visit and agrees with plan of treatment. I have discussed any further diagnostic evaluation that may be needed or ordered today. We also reviewed her medications today. she has been encouraged to call the office with any questions or concerns that should arise related to todays visit.    No orders of the defined types were placed in this encounter.   No orders of the defined types were placed in this encounter.   Time spent:30 Minutes   This patient was seen by Orson Gear AGNP-C in Collaboration with  Dr Lavera Guise as a part of collaborative care agreement     Kendell Bane AGNP-C Internal medicine

## 2020-01-30 ENCOUNTER — Ambulatory Visit: Payer: 59 | Admitting: Adult Health

## 2020-01-31 ENCOUNTER — Other Ambulatory Visit: Payer: Self-pay | Admitting: Adult Health

## 2020-01-31 DIAGNOSIS — Z1231 Encounter for screening mammogram for malignant neoplasm of breast: Secondary | ICD-10-CM

## 2020-01-31 DIAGNOSIS — Z09 Encounter for follow-up examination after completed treatment for conditions other than malignant neoplasm: Secondary | ICD-10-CM

## 2020-02-24 ENCOUNTER — Other Ambulatory Visit: Payer: Self-pay

## 2020-02-24 ENCOUNTER — Ambulatory Visit (INDEPENDENT_AMBULATORY_CARE_PROVIDER_SITE_OTHER): Payer: 59 | Admitting: Adult Health

## 2020-02-24 ENCOUNTER — Encounter: Payer: Self-pay | Admitting: Nurse Practitioner

## 2020-02-24 VITALS — Resp 16 | Ht 66.0 in

## 2020-02-24 DIAGNOSIS — J0100 Acute maxillary sinusitis, unspecified: Secondary | ICD-10-CM | POA: Diagnosis not present

## 2020-02-24 MED ORDER — AMOXICILLIN 500 MG PO CAPS: 500.0000 mg | ORAL_CAPSULE | Freq: Two times a day (BID) | ORAL | 0 refills | Status: DC

## 2020-02-24 NOTE — Progress Notes (Signed)
[facility] [address] [place], [place] [address]  Internal MEDICINE  Telephone Visit  Patient Name: Brittany Huynh  [id number]  [id number]  Date of Service: 02/24/2020  I connected with the patient at [phone] by telephone and verified the patients identity using two identifiers.   I discussed the limitations, risks, security and privacy concerns of performing an evaluation and management service by telephone and the availability of in person appointments. I also discussed with the patient that there may be a patient responsible charge related to the service.  The patient expressed understanding and agrees to proceed.    Chief Complaint  Patient presents with  . Telephone Assessment  . Telephone Screen  . Nasal Congestion    green mucus, coughing, sore throat    HPI  Pt seen via telephone.  She reports chest congestion and coughing up green mucous. She has some laryngitis.  She does have some sore throat.  This has been present now for 4 days.  She has a decreased apetitie, and PND.    Current Medication: Outpatient Encounter Medications as of 02/24/2020  Medication Sig  . albuterol (PROVENTIL) (2.5 MG/3ML) 0.083% nebulizer solution Take 3 mLs (2.5 mg total) by nebulization every 6 (six) hours as needed for wheezing or shortness of breath.  Marland Kitchen albuterol (VENTOLIN HFA) 108 (90 Base) MCG/ACT inhaler Inhale 2 puffs into the lungs every 6 (six) hours as needed for wheezing or shortness of breath.  Marland Kitchen alendronate (FOSAMAX) 70 MG tablet Take 1 tablet (70 mg total) by mouth every 7 (seven) days. Take with a full glass of water on an empty stomach.  Marland Kitchen aspirin-acetaminophen-caffeine (EXCEDRIN MIGRAINE) 250-250-65 MG per tablet Take 1 tablet by mouth every 6 (six) hours as needed for headache.   . augmented betamethasone dipropionate (DIPROLENE-AF) 0.05 % ointment APPLY TO AFFECTED AREA TWICE A DAY (Patient taking differently: Apply 1 application topically 2 (two) times daily. )  .  celecoxib (CELEBREX) 200 MG capsule Take 1 capsule (200 mg total) by mouth daily.  Marland Kitchen dicyclomine (BENTYL) 10 MG capsule TAKE 1 CAPSULE BY MOUTH EVERY DAY  . DULoxetine (CYMBALTA) 30 MG capsule Take 1 capsule (30 mg total) by mouth daily.  . mirtazapine (REMERON) 30 MG tablet Take 1 tablet (30 mg total) by mouth at bedtime.  Marland Kitchen oxyCODONE-acetaminophen (PERCOCET) 5-325 MG tablet Take 1 tablet by mouth 2 (two) times daily as needed.  . predniSONE (DELTASONE) 1 MG tablet PT is tapering dose, currently taking 9mg  weekly and will step down each week by 1mg . (Patient taking differently: Take 8 mg by mouth daily. )  . predniSONE (DELTASONE) 10 MG tablet Use per dose pack  . predniSONE (DELTASONE) 10 MG tablet Take 1 tablet (10 mg total) by mouth daily with breakfast.  . predniSONE (DELTASONE) 50 MG tablet Take 1 tablet by mouth daily  . Vitamin D, Ergocalciferol, (DRISDOL) 1.25 MG (50000 UT) CAPS capsule Take 1 capsule (50,000 Units total) by mouth every 7 (seven) days.  zolpidem (AMBIEN) 10 MG tablet Take 1 tablet (10 mg total) by mouth at bedtime as needed for sleep.   No facility-administered encounter medications on file as of 02/24/2020.    Surgical History: Past Surgical History:  Procedure Laterality Date  . ABDOMINAL HYSTERECTOMY  2009  . BREAST BIOPSY Left 02/15/2018   PREDOMINANTLY MATURE ADIPOSE TISSUE WITH VERY FOCAL AREAS OF BENIGN   . chamberlain procedure   2011  . IRRIGATION AND DEBRIDEMENT HEMATOMA Left 03/07/2018   Procedure: IRRIGATION  AND DEBRIDEMENT HEMATOMA-LEFT BREAST;  Surgeon: Robert Bellow, MD;  Location: [facility] ORS;  Service: General;  Laterality: Left;  . LUNG BIOPSY  2011  . RESECTION RIBS EXTRAPLEURAL  [id number]    Medical History: Past Medical History:  Diagnosis Date  . Anxiety   . Asthma   . Breast fibrocystic disorder 2013  . Hernia 2011   chest wall  . Lung mass 2011  . Sarcoidosis   . Shortness of breath    2011    Family History: Family History   Problem Relation Age of Onset  . Liver cancer Mother 17  . Bone cancer Maternal Aunt 60  . Breast cancer Maternal Aunt   . Breast cancer Paternal Aunt   . Bone cancer Paternal Aunt 56    Social History   Socioeconomic History  . Marital status: Single    Spouse name: Not on file  . Number of children: Not on file  . Years of education: Not on file  . Highest education level: Not on file  Occupational History  . Not on file  Tobacco Use  . Smoking status: Never Smoker  . Smokeless tobacco: Never Used  Vaping Use  . Vaping Use: Never used  Substance and Sexual Activity  . Alcohol use: Yes    Comment: on occassion  . Drug use: No  . Sexual activity: Not on file  Other Topics Concern  . Not on file  Social History Narrative  . Not on file   Social Determinants of Health   Financial Resource Strain:   . Difficulty of Paying Living Expenses:   Food Insecurity:   . Worried About Charity fundraiser in the Last Year:   . Arboriculturist in the Last Year:   Transportation Needs:   . Film/video editor (Medical):   Marland Kitchen Lack of Transportation (Non-Medical):   Physical Activity:   . Days of Exercise per Week:   . Minutes of Exercise per Session:   Stress:   . Feeling of Stress :   Social Connections:   . Frequency of Communication with Friends and Family:   . Frequency of Social Gatherings with Friends and Family:   . Attends Religious Services:   . Active Member of Clubs or Organizations:   . Attends Archivist Meetings:   Marland Kitchen Marital Status:   Intimate Partner Violence:   . Fear of Current or Ex-Partner:   . Emotionally Abused:   Marland Kitchen Physically Abused:   . Sexually Abused:       Review of Systems  Constitutional: Negative for chills, fatigue and unexpected weight change.  HENT: Positive for postnasal drip and sinus pressure. Negative for congestion, rhinorrhea, sneezing and sore throat.   Eyes: Negative for photophobia, pain and redness.   Respiratory: Positive for cough and chest tightness. Negative for shortness of breath.   Cardiovascular: Negative for chest pain and palpitations.  Gastrointestinal: Negative for abdominal pain, constipation, diarrhea, nausea and vomiting.  Endocrine: Negative.   Genitourinary: Negative for dysuria and frequency.  Musculoskeletal: Negative for arthralgias, back pain, joint swelling and neck pain.  Skin: Negative for rash.  Allergic/Immunologic: Negative.   Neurological: Negative for tremors and numbness.  Hematological: Negative for adenopathy. Does not bruise/bleed easily.  Psychiatric/Behavioral: Negative for behavioral problems and sleep disturbance. The patient is not nervous/anxious.     Vital Signs: Resp 16   Ht 5\' 6"  (1.676 m)   BMI 25.24 kg/m    Observation/Objective: Ill sounding, losing  voice.  NAD noted.    Assessment/Plan: 1. Acute maxillary sinusitis, recurrence not specified Advised patient to take entire course of antibiotics as prescribed with food. Pt should return to clinic in 7-10 days if symptoms fail to improve or new symptoms develop.  - amoxicillin (AMOXIL) 500 MG capsule; Take 1 capsule (500 mg total) by mouth 2 (two) times daily.  Dispense: 20 capsule; Refill: 0  General Counseling: Ivee verbalizes understanding of the findings of today's phone visit and agrees with plan of treatment. I have discussed any further diagnostic evaluation that may be needed or ordered today. We also reviewed her medications today. she has been encouraged to call the office with any questions or concerns that should arise related to todays visit.    No orders of the defined types were placed in this encounter.   No orders of the defined types were placed in this encounter.   Time spent: 20 Minutes    Blima Ledger [facility] Internal medicine

## 2020-04-16 ENCOUNTER — Telehealth: Payer: Self-pay

## 2020-04-16 ENCOUNTER — Other Ambulatory Visit: Payer: Self-pay | Admitting: Adult Health

## 2020-04-16 NOTE — Telephone Encounter (Signed)
Confirmed office visit 8/23 

## 2020-04-20 ENCOUNTER — Other Ambulatory Visit: Payer: Self-pay

## 2020-04-20 ENCOUNTER — Encounter: Payer: Self-pay | Admitting: Adult Health

## 2020-04-20 ENCOUNTER — Ambulatory Visit: Payer: 59 | Admitting: Adult Health

## 2020-04-20 VITALS — BP 138/90 | HR 104 | Temp 97.4°F | Resp 16 | Ht 66.0 in | Wt 159.4 lb

## 2020-04-20 DIAGNOSIS — J45909 Unspecified asthma, uncomplicated: Secondary | ICD-10-CM

## 2020-04-20 DIAGNOSIS — R300 Dysuria: Secondary | ICD-10-CM

## 2020-04-20 DIAGNOSIS — F3341 Major depressive disorder, recurrent, in partial remission: Secondary | ICD-10-CM

## 2020-04-20 DIAGNOSIS — E559 Vitamin D deficiency, unspecified: Secondary | ICD-10-CM | POA: Diagnosis not present

## 2020-04-20 DIAGNOSIS — D860 Sarcoidosis of lung: Secondary | ICD-10-CM | POA: Diagnosis not present

## 2020-04-20 DIAGNOSIS — G4700 Insomnia, unspecified: Secondary | ICD-10-CM

## 2020-04-20 DIAGNOSIS — M2550 Pain in unspecified joint: Secondary | ICD-10-CM | POA: Diagnosis not present

## 2020-04-20 DIAGNOSIS — I10 Essential (primary) hypertension: Secondary | ICD-10-CM

## 2020-04-20 DIAGNOSIS — R3915 Urgency of urination: Secondary | ICD-10-CM

## 2020-04-20 DIAGNOSIS — K219 Gastro-esophageal reflux disease without esophagitis: Secondary | ICD-10-CM

## 2020-04-20 DIAGNOSIS — R69 Illness, unspecified: Secondary | ICD-10-CM | POA: Diagnosis not present

## 2020-04-20 DIAGNOSIS — F339 Major depressive disorder, recurrent, unspecified: Secondary | ICD-10-CM

## 2020-04-20 LAB — POCT URINALYSIS DIPSTICK (MANUAL)
Leukocytes, UA: NEGATIVE
Nitrite, UA: NEGATIVE
Poct Bilirubin: NEGATIVE
Poct Blood: NEGATIVE
Poct Glucose: NORMAL mg/dL
Poct Ketones: NEGATIVE
Poct Urobilinogen: NORMAL mg/dL
Spec Grav, UA: 1.01 (ref 1.010–1.025)
pH, UA: 5 (ref 5.0–8.0)

## 2020-04-20 MED ORDER — PREDNISONE 1 MG PO TABS: ORAL_TABLET | ORAL | 3 refills | Status: DC

## 2020-04-20 MED ORDER — DICYCLOMINE HCL 10 MG PO CAPS: ORAL_CAPSULE | ORAL | 0 refills | Status: DC

## 2020-04-20 MED ORDER — ZOLPIDEM TARTRATE 10 MG PO TABS: 10.0000 mg | ORAL_TABLET | Freq: Every evening | ORAL | 1 refills | Status: DC | PRN

## 2020-04-20 MED ORDER — MIRTAZAPINE 30 MG PO TABS: 30.0000 mg | ORAL_TABLET | Freq: Every day | ORAL | 0 refills | Status: DC

## 2020-04-20 MED ORDER — DULOXETINE HCL 30 MG PO CPEP: 30.0000 mg | ORAL_CAPSULE | Freq: Every day | ORAL | 1 refills | Status: DC

## 2020-04-20 MED ORDER — CELECOXIB 200 MG PO CAPS: 200.0000 mg | ORAL_CAPSULE | Freq: Every day | ORAL | 3 refills | Status: DC

## 2020-04-20 MED ORDER — PREDNISONE 10 MG PO TABS: 10.0000 mg | ORAL_TABLET | Freq: Every day | ORAL | 3 refills | Status: DC

## 2020-04-20 NOTE — Progress Notes (Signed)
[facility] [address] [place], [place] [address]  Internal MEDICINE  Office Visit Note  Patient Name: Brittany Huynh  [id number]  [id number]  Date of Service: 04/20/2020  Chief Complaint  Patient presents with  . Follow-up    3 month fup,breathing issues, wasn't able to get mammogram yet    HPI  PT is here for for 3 month follow up.  She reports overall she is at her baseline.  She continues to have multiple complaints.  These complaints include urinary urgency intermittently, 4-5 bowel movements some days.  She continues to have sob from her sarcoid.  She reports this is worse with walking or exertion.  She reports sweating and feeling bad when this happens.  She also reports only being able to sleep on left side, she gets headaches if she tries to sleep on her right side.    Current Medication: Outpatient Encounter Medications as of 04/20/2020  Medication Sig  . albuterol (PROVENTIL) (2.5 MG/3ML) 0.083% nebulizer solution Take 3 mLs (2.5 mg total) by nebulization every 6 (six) hours as needed for wheezing or shortness of breath.  Marland Kitchen albuterol (VENTOLIN HFA) 108 (90 Base) MCG/ACT inhaler Inhale 2 puffs into the lungs every 6 (six) hours as needed for wheezing or shortness of breath.  Marland Kitchen alendronate (FOSAMAX) 70 MG tablet Take 1 tablet (70 mg total) by mouth every 7 (seven) days. Take with a full glass of water on an empty stomach.  Marland Kitchen aspirin-acetaminophen-caffeine (EXCEDRIN MIGRAINE) 250-250-65 MG per tablet Take 1 tablet by mouth every 6 (six) hours as needed for headache.   . augmented betamethasone dipropionate (DIPROLENE-AF) 0.05 % ointment APPLY TO AFFECTED AREA TWICE A DAY (Patient taking differently: Apply 1 application topically 2 (two) times daily. )  . celecoxib (CELEBREX) 200 MG capsule Take 1 capsule (200 mg total) by mouth daily.  Marland Kitchen dicyclomine (BENTYL) 10 MG capsule TAKE 1 CAPSULE BY MOUTH EVERY DAY  . DULoxetine (CYMBALTA) 30 MG capsule Take 1 capsule (30  mg total) by mouth daily.  . mirtazapine (REMERON) 30 MG tablet Take 1 tablet (30 mg total) by mouth at bedtime.  . predniSONE (DELTASONE) 1 MG tablet PT is tapering dose, currently taking 12mg  weekly and will step down each week by 1mg .  . predniSONE (DELTASONE) 10 MG tablet Take 1 tablet (10 mg total) by mouth daily with breakfast.  . Vitamin D, Ergocalciferol, (DRISDOL) 1.25 MG (50000 UT) CAPS capsule Take 1 capsule (50,000 Units total) by mouth every 7 (seven) days.  . [DISCONTINUED] celecoxib (CELEBREX) 200 MG capsule Take 1 capsule (200 mg total) by mouth daily.  . [DISCONTINUED] dicyclomine (BENTYL) 10 MG capsule TAKE 1 CAPSULE BY MOUTH EVERY DAY  . [DISCONTINUED] DULoxetine (CYMBALTA) 30 MG capsule TAKE 1 CAPSULE BY MOUTH EVERY DAY  . [DISCONTINUED] mirtazapine (REMERON) 30 MG tablet Take 1 tablet (30 mg total) by mouth at bedtime.  . [DISCONTINUED] predniSONE (DELTASONE) 1 MG tablet PT is tapering dose, currently taking 9mg  weekly and will step down each week by 1mg . (Patient taking differently: Take 8 mg by mouth daily. )  . [DISCONTINUED] predniSONE (DELTASONE) 10 MG tablet Take 1 tablet (10 mg total) by mouth daily with breakfast.  . zolpidem (AMBIEN) 10 MG tablet Take 1 tablet (10 mg total) by mouth at bedtime as needed for sleep.  . [DISCONTINUED] amoxicillin (AMOXIL) 500 MG capsule Take 1 capsule (500 mg total) by mouth 2 (two) times daily. (Patient not taking: Reported on 04/20/2020)  . [DISCONTINUED] oxyCODONE-acetaminophen (  PERCOCET) 5-325 MG tablet Take 1 tablet by mouth 2 (two) times daily as needed. (Patient not taking: Reported on 04/20/2020)  . [DISCONTINUED] predniSONE (DELTASONE) 10 MG tablet Use per dose pack (Patient not taking: Reported on 04/20/2020)  . [DISCONTINUED] predniSONE (DELTASONE) 50 MG tablet Take 1 tablet by mouth daily (Patient not taking: Reported on 04/20/2020)  . [DISCONTINUED] zolpidem (AMBIEN) 10 MG tablet Take 1 tablet (10 mg total) by mouth at bedtime as  needed for sleep.   No facility-administered encounter medications on file as of 04/20/2020.    Surgical History: Past Surgical History:  Procedure Laterality Date  . ABDOMINAL HYSTERECTOMY  2009  . BREAST BIOPSY Left 02/15/2018   PREDOMINANTLY MATURE ADIPOSE TISSUE WITH VERY FOCAL AREAS OF BENIGN   . chamberlain procedure   2011  . IRRIGATION AND DEBRIDEMENT HEMATOMA Left 03/07/2018   Procedure: IRRIGATION AND DEBRIDEMENT HEMATOMA-LEFT BREAST;  Surgeon: Earline Mayotte, MD;  Location: [facility] ORS;  Service: General;  Laterality: Left;  . LUNG BIOPSY  2011  . RESECTION RIBS EXTRAPLEURAL  [id number]    Medical History: Past Medical History:  Diagnosis Date  . Anxiety   . Asthma   . Breast fibrocystic disorder 2013  . Hernia 2011   chest wall  . Lung mass 2011  . Sarcoidosis   . Shortness of breath    2011    Family History: Family History  Problem Relation Age of Onset  . Liver cancer Mother 76  . Bone cancer Maternal Aunt 60  . Breast cancer Maternal Aunt   . Breast cancer Paternal Aunt   . Bone cancer Paternal Aunt 97    Social History   Socioeconomic History  . Marital status: Single    Spouse name: Not on file  . Number of children: Not on file  . Years of education: Not on file  . Highest education level: Not on file  Occupational History  . Not on file  Tobacco Use  . Smoking status: Never Smoker  . Smokeless tobacco: Never Used  Vaping Use  . Vaping Use: Never used  Substance and Sexual Activity  . Alcohol use: Yes    Comment: on occassion  . Drug use: No  . Sexual activity: Not on file  Other Topics Concern  . Not on file  Social History Narrative  . Not on file   Social Determinants of Health   Financial Resource Strain:   . Difficulty of Paying Living Expenses: Not on file  Food Insecurity:   . Worried About Programme researcher, broadcasting/film/video in the Last Year: Not on file  . Ran Out of Food in the Last Year: Not on file  Transportation Needs:   . Lack of  Transportation (Medical): Not on file  . Lack of Transportation (Non-Medical): Not on file  Physical Activity:   . Days of Exercise per Week: Not on file  . Minutes of Exercise per Session: Not on file  Stress:   . Feeling of Stress : Not on file  Social Connections:   . Frequency of Communication with Friends and Family: Not on file  . Frequency of Social Gatherings with Friends and Family: Not on file  . Attends Religious Services: Not on file  . Active Member of Clubs or Organizations: Not on file  . Attends Banker Meetings: Not on file  . Marital Status: Not on file  Intimate Partner Violence:   . Fear of Current or Ex-Partner: Not on file  . Emotionally  Abused: Not on file  . Physically Abused: Not on file  . Sexually Abused: Not on file      Review of Systems  Constitutional: Negative for chills, fatigue and unexpected weight change.  HENT: Negative for congestion, rhinorrhea, sneezing and sore throat.   Eyes: Negative for photophobia, pain and redness.  Respiratory: Negative for cough, chest tightness and shortness of breath.   Cardiovascular: Negative for chest pain and palpitations.  Gastrointestinal: Negative for abdominal pain, constipation, diarrhea, nausea and vomiting.  Endocrine: Negative.   Genitourinary: Negative for dysuria and frequency.  Musculoskeletal: Negative for arthralgias, back pain, joint swelling and neck pain.  Skin: Negative for rash.  Allergic/Immunologic: Negative.   Neurological: Negative for tremors and numbness.  Hematological: Negative for adenopathy. Does not bruise/bleed easily.  Psychiatric/Behavioral: Negative for behavioral problems and sleep disturbance. The patient is not nervous/anxious.     Vital Signs: BP 138/90   Pulse (!) 104   Temp (!) 97.4 F (36.3 C)   Resp 16   Ht  (1.676 m)   Wt 159 lb 6.4 oz (72.3 kg)   SpO2 97%   BMI 25.73 kg/m    Physical Exam Vitals and nursing note reviewed.   Constitutional:      General: She is not in acute distress.    Appearance: She is well-developed. She is not diaphoretic.  HENT:     Head: Normocephalic and atraumatic.     Mouth/Throat:     Pharynx: No oropharyngeal exudate.  Eyes:     Pupils: Pupils are equal, round, and reactive to light.  Neck:     Thyroid: No thyromegaly.     Vascular: No JVD.     Trachea: No tracheal deviation.  Cardiovascular:     Rate and Rhythm: Normal rate and regular rhythm.     Heart sounds: Normal heart sounds. No murmur heard.  No friction rub. No gallop.   Pulmonary:     Effort: Pulmonary effort is normal. No respiratory distress.     Breath sounds: Normal breath sounds. No wheezing or rales.  Chest:     Chest wall: No tenderness.  Abdominal:     Palpations: Abdomen is soft.     Tenderness: There is no abdominal tenderness. There is no guarding.  Musculoskeletal:        General: Normal range of motion.     Cervical back: Normal range of motion and neck supple.  Lymphadenopathy:     Cervical: No cervical adenopathy.  Skin:    General: Skin is warm and dry.  Neurological:     Mental Status: She is alert and oriented to person, place, and time.     Cranial Nerves: No cranial nerve deficit.  Psychiatric:        Behavior: Behavior normal.        Thought Content: Thought content normal.        Judgment: Judgment normal.    Assessment/Plan: 1. Urinary urgency Protein in urine, otherwise clear. - POCT Urinalysis Dip Manual  2. Diffuse arthralgia Good results with celebrex, continue to folow.  - celecoxib (CELEBREX) 200 MG capsule; Take 1 capsule (200 mg total) by mouth daily.  Dispense: 90 capsule; Refill: 3  3. Sarcoidosis of lung (HCC) Continue to use prednisone as discussed.  - predniSONE (DELTASONE) 1 MG tablet; PT is tapering dose, currently taking  weekly and will step down each week by .  Dispense: 60 tablet; Refill: 3 - predniSONE (DELTASONE) 10 MG tablet; Take 1 tablet (  10  mg total) by mouth daily with breakfast.  Dispense: 30 tablet; Refill: 3  4. Insomnia, unspecified type Reviewed risks and possible side effects associated with taking opiates, benzodiazepines and other CNS depressants. Combination of these could cause dizziness and drowsiness. Advised patient not to drive or operate machinery when taking these medications, as patient's and other's life can be at risk and will have consequences. Patient verbalized understanding in this matter. Dependence and abuse for these drugs will be monitored closely. A Controlled substance policy and procedure is on file which allows [facility] to order a urine drug screen test at any visit. Patient understands and agrees with the plan - zolpidem (AMBIEN) 10 MG tablet; Take 1 tablet (10 mg total) by mouth at bedtime as needed for sleep.  Dispense: 30 tablet; Refill: 1  5. Episode of recurrent major depressive disorder, unspecified depression episode severity (HCC) Stable, continue with remeron as prescribed.  - mirtazapine (REMERON) 30 MG tablet; Take 1 tablet (30 mg total) by mouth at bedtime.  Dispense: 90 tablet; Refill: 0  6. Hypertension, unspecified type Controlled, continue current medications.   7. Vitamin D deficiency Continue to take Vit D weekly as prescribed.  8. Moderate asthma without complication, unspecified whether persistent No new symptoms.  Continue to monitor.   9. Recurrent major depressive disorder, in partial remission (HCC) - DULoxetine (CYMBALTA) 30 MG capsule; Take 1 capsule (30 mg total) by mouth daily.  Dispense: 90 capsule; Refill: 1  10. Gastro-esophageal reflux disease without esophagitis - dicyclomine (BENTYL) 10 MG capsule; TAKE 1 CAPSULE BY MOUTH EVERY DAY  Dispense: 90 capsule; Refill: 0  11. Dysuria Protein in urine - POCT Urinalysis Dip Manual  General Counseling: Lodena verbalizes understanding of the findings of todays visit and agrees with plan of treatment. I  have discussed any further diagnostic evaluation that may be needed or ordered today. We also reviewed her medications today. she has been encouraged to call the office with any questions or concerns that should arise related to todays visit.    Orders Placed This Encounter  Procedures  . POCT Urinalysis Dip Manual    Meds ordered this encounter  Medications  . predniSONE (DELTASONE) 1 MG tablet    Sig: PT is tapering dose, currently taking 12mg  weekly and will step down each week by 1mg .    Dispense:  60 tablet    Refill:  3  . predniSONE (DELTASONE) 10 MG tablet    Sig: Take 1 tablet (10 mg total) by mouth daily with breakfast.    Dispense:  30 tablet    Refill:  3  . mirtazapine (REMERON) 30 MG tablet    Sig: Take 1 tablet (30 mg total) by mouth at bedtime.    Dispense:  90 tablet    Refill:  0  . zolpidem (AMBIEN) 10 MG tablet    Sig: Take 1 tablet (10 mg total) by mouth at bedtime as needed for sleep.    Dispense:  30 tablet    Refill:  1  . celecoxib (CELEBREX) 200 MG capsule    Sig: Take 1 capsule (200 mg total) by mouth daily.    Dispense:  90 capsule    Refill:  3  . DULoxetine (CYMBALTA) 30 MG capsule    Sig: Take 1 capsule (30 mg total) by mouth daily.    Dispense:  90 capsule    Refill:  1  . dicyclomine (BENTYL) 10 MG capsule    Sig: TAKE 1  CAPSULE BY MOUTH EVERY DAY    Dispense:  90 capsule    Refill:  0    Time spent: 30 Minutes   This patient was seen by Blima Ledger AGNP-C in Collaboration with Dr Lyndon Code as a part of collaborative care agreement     [name] AGNP-C Internal medicine

## 2020-04-30 ENCOUNTER — Telehealth: Payer: Self-pay

## 2020-04-30 NOTE — Telephone Encounter (Signed)
LMOM for OV 9/7 

## 2020-05-05 ENCOUNTER — Ambulatory Visit: Payer: 59 | Admitting: Internal Medicine

## 2020-05-27 ENCOUNTER — Encounter: Payer: Self-pay | Admitting: Internal Medicine

## 2020-05-27 ENCOUNTER — Ambulatory Visit (INDEPENDENT_AMBULATORY_CARE_PROVIDER_SITE_OTHER): Payer: 59 | Admitting: Internal Medicine

## 2020-05-27 ENCOUNTER — Other Ambulatory Visit: Payer: Self-pay

## 2020-05-27 VITALS — BP 132/98 | HR 104 | Temp 97.2°F | Resp 16 | Ht 66.0 in | Wt 161.4 lb

## 2020-05-27 DIAGNOSIS — R69 Illness, unspecified: Secondary | ICD-10-CM | POA: Diagnosis not present

## 2020-05-27 DIAGNOSIS — Z124 Encounter for screening for malignant neoplasm of cervix: Secondary | ICD-10-CM

## 2020-05-27 DIAGNOSIS — Z113 Encounter for screening for infections with a predominantly sexual mode of transmission: Secondary | ICD-10-CM | POA: Diagnosis not present

## 2020-05-27 DIAGNOSIS — Z1231 Encounter for screening mammogram for malignant neoplasm of breast: Secondary | ICD-10-CM

## 2020-05-27 DIAGNOSIS — Z0001 Encounter for general adult medical examination with abnormal findings: Secondary | ICD-10-CM | POA: Diagnosis not present

## 2020-05-27 DIAGNOSIS — F3341 Major depressive disorder, recurrent, in partial remission: Secondary | ICD-10-CM | POA: Diagnosis not present

## 2020-05-27 DIAGNOSIS — R300 Dysuria: Secondary | ICD-10-CM

## 2020-05-27 DIAGNOSIS — E538 Deficiency of other specified B group vitamins: Secondary | ICD-10-CM | POA: Diagnosis not present

## 2020-05-27 DIAGNOSIS — D860 Sarcoidosis of lung: Secondary | ICD-10-CM

## 2020-05-27 DIAGNOSIS — M818 Other osteoporosis without current pathological fracture: Secondary | ICD-10-CM

## 2020-05-27 DIAGNOSIS — G4701 Insomnia due to medical condition: Secondary | ICD-10-CM | POA: Diagnosis not present

## 2020-05-27 DIAGNOSIS — T50905A Adverse effect of unspecified drugs, medicaments and biological substances, initial encounter: Secondary | ICD-10-CM

## 2020-05-27 LAB — POCT URINALYSIS DIPSTICK
Bilirubin, UA: POSITIVE
Blood, UA: NEGATIVE
Glucose, UA: NEGATIVE
Ketones, UA: POSITIVE
Leukocytes, UA: NEGATIVE
Nitrite, UA: NEGATIVE
Protein, UA: POSITIVE — AB
Spec Grav, UA: >=1.03 — AB (ref 1.010–1.025)
Urobilinogen, UA: NEGATIVE E.U./dL — AB
pH, UA: 5 (ref 5.0–8.0)

## 2020-05-27 NOTE — Progress Notes (Signed)
[facility] [address] [place], [place] [address]  Internal MEDICINE  Office Visit Note  Patient Name: Brittany Huynh  [id number]  [id number]  Date of Service: 06/01/2020  Chief Complaint  Patient presents with  . Annual Exam    breast are tender and hurt, tingeling and weakness in legs during the day, at night restless feeling in legs,has been very noticable in the last 3-4 weeks, urgency, frequency, pressure in bladder, back pain  . Quality Metric Gaps    HepC, TDAP, colonoscopy     HPI Pt is here for routine health maintenance examination. 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 She has done very well on combination of Cymbalta and Remeron. Pt does take Ambien prn only    Multiple symptoms of stress incontinence and will need be looked into. Pt is menopausal and is scheduled for mammogram. She has not been on HRT however on fosamax for osteoporosis  Current Medication: Outpatient Encounter Medications as of 05/27/2020  Medication Sig  . albuterol (PROVENTIL) (2.5 MG/3ML) 0.083% nebulizer solution Take 3 mLs (2.5 mg total) by nebulization every 6 (six) hours as needed for wheezing or shortness of breath.  Marland Kitchen albuterol (VENTOLIN HFA) 108 (90 Base) MCG/ACT inhaler Inhale 2 puffs into the lungs every 6 (six) hours as needed for wheezing or shortness of breath.  Marland Kitchen alendronate (FOSAMAX) 70 MG tablet Take 1 tablet (70 mg total) by mouth every 7 (seven) days. Take with a full glass of water on an empty stomach.  Marland Kitchen augmented betamethasone dipropionate (DIPROLENE-AF) 0.05 % ointment APPLY TO AFFECTED AREA TWICE A DAY (Patient taking differently: Apply 1 application topically 2 (two) times daily. )  . celecoxib (CELEBREX) 200 MG capsule Take 1 capsule (200 mg total) by mouth daily.  Marland Kitchen dicyclomine (BENTYL) 10 MG capsule TAKE 1 CAPSULE BY MOUTH EVERY DAY  . DULoxetine (CYMBALTA) 30 MG capsule Take 1 capsule (30 mg total) by mouth  daily.  . mirtazapine (REMERON) 30 MG tablet Take 1 tablet (30 mg total) by mouth at bedtime.  . predniSONE (DELTASONE) 1 MG tablet PT is tapering dose, currently taking 12mg  weekly and will step down each week by 1mg .  . predniSONE (DELTASONE) 10 MG tablet Take 1 tablet (10 mg total) by mouth daily with breakfast.  . Vitamin D, Ergocalciferol, (DRISDOL) 1.25 MG (50000 UT) CAPS capsule Take 1 capsule (50,000 Units total) by mouth every 7 (seven) days.  zolpidem (AMBIEN) 10 MG tablet Take 1 tablet (10 mg total) by mouth at bedtime as needed for sleep.  aspirin-acetaminophen-caffeine (EXCEDRIN MIGRAINE) 250-250-65 MG per tablet Take 1 tablet by mouth every 6 (six) hours as needed for headache.  (Patient not taking: Reported on 05/27/2020)  . [EXPIRED] cyanocobalamin ((VITAMIN B-12)) injection 1,000 mcg    No facility-administered encounter medications on file as of 05/27/2020.    Surgical History: Past Surgical History:  Procedure Laterality Date  . ABDOMINAL HYSTERECTOMY  2009  . BREAST BIOPSY Left 02/15/2018   PREDOMINANTLY MATURE ADIPOSE TISSUE WITH VERY FOCAL AREAS OF BENIGN   . chamberlain procedure   2011  . IRRIGATION AND DEBRIDEMENT HEMATOMA Left 03/07/2018   Procedure: IRRIGATION AND DEBRIDEMENT HEMATOMA-LEFT BREAST;  Surgeon: 2012, MD;  Location: [facility] ORS;  Service: General;  Laterality: Left;  . LUNG BIOPSY  2011  . RESECTION RIBS EXTRAPLEURAL  Earline Mayotte    Medical History: Past Medical History:  Diagnosis Date  . Anxiety   .  Asthma   . Breast fibrocystic disorder 2013  . Hernia 2011   chest wall  . Lung mass 2011  . Sarcoidosis   . Shortness of breath    2011    Family History: Family History  Problem Relation Age of Onset  . Liver cancer Mother 75  . Bone cancer Maternal Aunt 60  . Breast cancer Maternal Aunt   . Breast cancer Paternal Aunt   . Kidney cancer Father   . Bone cancer Paternal Aunt 17   Social History   Socioeconomic History  .  Marital status: Single    Spouse name: Not on file  . Number of children: Not on file  . Years of education: Not on file  . Highest education level: Not on file  Occupational History  . Not on file  Tobacco Use  . Smoking status: Never Smoker  . Smokeless tobacco: Never Used  Vaping Use  . Vaping Use: Never used  Substance and Sexual Activity  . Alcohol use: Yes    Comment: on occassion  . Drug use: No  . Sexual activity: Not on file  Other Topics Concern  . Not on file  Social History Narrative  . Not on file   Social Determinants of Health   Financial Resource Strain:   . Difficulty of Paying Living Expenses: Not on file  Food Insecurity:   . Worried About Programme researcher, broadcasting/film/video in the Last Year: Not on file  . Ran Out of Food in the Last Year: Not on file  Transportation Needs:   . Lack of Transportation (Medical): Not on file  . Lack of Transportation (Non-Medical): Not on file  Physical Activity:   . Days of Exercise per Week: Not on file  . Minutes of Exercise per Session: Not on file  Stress:   . Feeling of Stress : Not on file  Social Connections:   . Frequency of Communication with Friends and Family: Not on file  . Frequency of Social Gatherings with Friends and Family: Not on file  . Attends Religious Services: Not on file  . Active Member of Clubs or Organizations: Not on file  . Attends Banker Meetings: Not on file  . Marital Status: Not on file     Review of Systems  Constitutional: Negative for chills, diaphoresis and fatigue.  HENT: Negative for ear pain, postnasal drip and sinus pressure.   Eyes: Negative for photophobia, discharge, redness, itching and visual disturbance.  Respiratory: Negative for cough, shortness of breath and wheezing.   Cardiovascular: Negative for chest pain, palpitations and leg swelling.  Gastrointestinal: Negative for abdominal pain, constipation, diarrhea, nausea and vomiting.  Genitourinary: Positive for  dysuria and pelvic pain. Negative for flank pain.  Musculoskeletal: Positive for back pain. Negative for arthralgias, gait problem and neck pain.  Skin: Negative for color change.  Allergic/Immunologic: Negative for environmental allergies and food allergies.  Neurological: Positive for weakness. Negative for dizziness and headaches.  Hematological: Does not bruise/bleed easily.  Psychiatric/Behavioral: Negative for agitation, behavioral problems (depression) and hallucinations.     Vital Signs: BP (!) 132/98   Pulse (!) 104   Temp (!) 97.2 F (36.2 C)   Resp 16   Ht 5\' 6"  (1.676 m)   Wt 161 lb 6.4 oz (73.2 kg)   SpO2 99%   BMI 26.05 kg/m    Physical Exam Constitutional:      General: She is not in acute distress.    Appearance: She  is well-developed. She is not diaphoretic.  HENT:     Head: Normocephalic and atraumatic.     Mouth/Throat:     Pharynx: No oropharyngeal exudate.  Eyes:     Pupils: Pupils are equal, round, and reactive to light.  Neck:     Thyroid: No thyromegaly.     Vascular: No JVD.     Trachea: No tracheal deviation.  Cardiovascular:     Rate and Rhythm: Normal rate and regular rhythm.     Heart sounds: Normal heart sounds. No murmur heard.  No friction rub. No gallop.   Pulmonary:     Effort: Pulmonary effort is normal. No respiratory distress.     Breath sounds: No wheezing or rales.  Chest:     Chest wall: No tenderness.     Breasts:        Right: Normal.        Left: Normal.  Abdominal:     General: Bowel sounds are normal.     Palpations: Abdomen is soft.     Hernia: There is no hernia in the left inguinal area.  Genitourinary:    General: Normal vulva.     Exam position: Lithotomy position.     Labia:        Right: No lesion.        Left: No lesion.      Urethra: No prolapse or urethral swelling.  Musculoskeletal:        General: Normal range of motion.     Cervical back: Normal range of motion and neck supple.  Lymphadenopathy:      Cervical: No cervical adenopathy.     Lower Body: No left inguinal adenopathy.  Skin:    General: Skin is warm and dry.  Neurological:     Mental Status: She is alert and oriented to person, place, and time.     Cranial Nerves: No cranial nerve deficit.  Psychiatric:        Behavior: Behavior normal.        Thought Content: Thought content normal.        Judgment: Judgment normal.      LABS: Recent Results (from the past 2160 hour(s))  POCT Urinalysis Dip Manual     Status: None   Collection Time: 04/20/20  4:41 PM  Result Value Ref Range   Spec Grav, UA 1.010 1.010 - 1.025   pH, UA 5.0 5.0 - 8.0   Leukocytes, UA Negative Negative   Nitrite, UA Negative Negative   Poct Protein trace Negative, trace mg/dL   Poct Glucose Normal Normal mg/dL   Poct Ketones Negative Negative   Poct Urobilinogen Normal Normal mg/dL   Poct Bilirubin Negative Negative   Poct Blood Negative Negative, trace  IGP, Aptima HPV     Status: None   Collection Time: 05/27/20 10:30 AM  Result Value Ref Range   Interpretation NILM     Comment: NEGATIVE FOR INTRAEPITHELIAL LESION OR MALIGNANCY.   Category NIL     Comment: Negative for Intraepithelial Lesion   Adequacy SPHHR     Comment: Satisfactory for evaluation. No endocervical cells are present. This is consistent with a history of hysterectomy.    Clinician Provided ICD10 Comment     Comment: Z12.4   Performed by: Comment     Comment: Elie Confer, Cytotechnologist (ASCP)   Note: Comment     Comment: The Pap smear is a screening test designed to aid in the detection of premalignant and malignant conditions  of the uterine cervix.  It is not a diagnostic procedure and should not be used as the sole means of detecting cervical cancer.  Both false-positive and false-negative reports do occur.    Test Methodology Comment     Comment: This liquid based ThinPrep(R) pap test was screened with the use of an image guided system.    HPV Aptima  Negative Negative    Comment: This nucleic acid amplification test detects fourteen high-risk HPV types (16,18,31,33,35,39,45,51,52,56,58,59,66,68) without differentiation.   NuSwab Vaginitis Plus (VG+)     Status: None   Collection Time: 05/27/20 10:30 AM  Result Value Ref Range   Atopobium vaginae Low - 0 Score   BVAB 2 Low - 0 Score   Megasphaera 1 Low - 0 Score    Comment: Calculate total score by adding the 3 individual bacterial vaginosis (BV) marker scores together.  Total score is interpreted as follows: Total score 0-1: Indicates the absence of BV. Total score   2: Indeterminate for BV. Additional clinical                  data should be evaluated to establish a                  diagnosis. Total score 3-6: Indicates the presence of BV. This test was developed and its performance characteristics determined by [facility].  It has not been cleared or approved by the Food and Drug Administration.    Candida albicans, NAA Negative Negative   Candida glabrata, NAA Negative Negative   Trich vag by NAA Negative Negative   Chlamydia trachomatis, NAA Negative Negative   Neisseria gonorrhoeae, NAA Negative Negative  POCT Urinalysis Dipstick     Status: Abnormal   Collection Time: 05/27/20 11:43 AM  Result Value Ref Range   Color, UA     Clarity, UA     Glucose, UA Negative Negative   Bilirubin, UA positive    Ketones, UA positive    Spec Grav, UA >=1.030 (A) 1.010 - 1.025   Blood, UA negative    pH, UA 5.0 5.0 - 8.0   Protein, UA Positive (A) Negative   Urobilinogen, UA negative (A) 0.2 or 1.0 E.U./dL   Nitrite, UA negative    Leukocytes, UA Negative Negative   Appearance     Odor    UA/M w/rflx Culture, Routine     Status: Abnormal   Collection Time: 05/27/20 12:27 PM   Specimen: Urine   Urine  Result Value Ref Range   Specific Gravity, UA 1.025 1.005 - 1.030   pH, UA 5.5 5.0 - 7.5   Color, UA Yellow Yellow   Appearance Ur Cloudy (A) Clear   Leukocytes,UA Negative  Negative   Protein,UA Trace Negative/Trace   Glucose, UA Negative Negative   Ketones, UA Negative Negative   RBC, UA Negative Negative   Bilirubin, UA Negative Negative   Urobilinogen, Ur 0.2 0.2 - 1.0 mg/dL   Nitrite, UA Negative Negative   Microscopic Examination Comment     Comment: Microscopic follows if indicated.   Microscopic Examination See below:     Comment: Microscopic was indicated and was performed.   Urinalysis Reflex Comment     Comment: This specimen will not reflex to a Urine Culture.  Microscopic Examination     Status: Abnormal   Collection Time: 05/27/20 12:27 PM   Urine  Result Value Ref Range   WBC, UA 0-5 0 - 5 /hpf   RBC None seen  0 - 2 /hpf   Epithelial Cells (non renal) 0-10 0 - 10 /hpf   Casts None seen None seen /lpf   Crystals Present (A) N/A   Crystal Type Calcium Oxalate N/A   Bacteria, UA Few None seen/Few    Assessment/Plan: 1. Encounter for general adult medical examination with abnormal findings Update all PHM according to guidelines and age group  - Ambulatory referral to Gastroenterology - CBC with Differential/Platelet; Future - Lipid Panel With LDL/HDL Ratio; Future - TSH; Future - T4, free; Future - Comprehensive metabolic panel - W11 and Folate Panel - Fe+TIBC+Fer  2. Routine cervical smear No significant abnormality noticed  - IGP, Aptima HPV  3. Screen for sexually transmitted diseases - NuSwab Vaginitis Plus (VG+)  4. Recurrent major depressive disorder, in partial remission (HCC) Pt to continue on Cymbalta and Remeron   5. Visit for screening mammogram Exam is benign  - MM DIGITAL SCREENING BILATERAL; Future  6. B12 deficiency - B12 and Folate Panel - Fe+TIBC+Fer  7. Dysuria - POCT Urinalysis Dipstick - UA/M w/rflx Culture, Routine  8. 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 Insomnia due to medical condition Pt has some restless legs and insomnia, will wait  for ferritin  level   General Counseling: Val Eagle understanding of the findings of todays visit and agrees with plan of treatment. I have discussed any further diagnostic evaluation that may be needed or ordered today. We also reviewed her medications today. she has been encouraged to call the office with any questions or concerns that should arise related to todays visit. Orders Placed This Encounter  Procedures  . Microscopic Examination  . MM DIGITAL SCREENING BILATERAL  . CBC with Differential/Platelet  . Lipid Panel With LDL/HDL Ratio  . TSH  . T4, free  . Comprehensive metabolic panel  . B12 and Folate Panel  . Fe+TIBC+Fer  . UA/M w/rflx Culture, Routine  . NuSwab Vaginitis Plus (VG+)  . Ambulatory referral to Gastroenterology  . POCT Urinalysis Dipstick    Total time spent:35 Minutes  Time spent includes review of chart, medications, test results, and follow up plan with the patient.     Lyndon Code, MD  Internal Medicine

## 2020-05-28 ENCOUNTER — Encounter: Payer: 59 | Admitting: Internal Medicine

## 2020-05-28 LAB — MICROSCOPIC EXAMINATION
Casts: NONE SEEN /lpf
RBC, Urine: NONE SEEN /hpf (ref 0–2)

## 2020-05-28 LAB — UA/M W/RFLX CULTURE, ROUTINE
Bilirubin, UA: NEGATIVE
Glucose, UA: NEGATIVE
Ketones, UA: NEGATIVE
Leukocytes,UA: NEGATIVE
Nitrite, UA: NEGATIVE
RBC, UA: NEGATIVE
Specific Gravity, UA: 1.025 (ref 1.005–1.030)
Urobilinogen, Ur: 0.2 mg/dL (ref 0.2–1.0)
pH, UA: 5.5 (ref 5.0–7.5)

## 2020-05-30 LAB — NUSWAB VAGINITIS PLUS (VG+)
Candida albicans, NAA: NEGATIVE
Candida glabrata, NAA: NEGATIVE
Chlamydia trachomatis, NAA: NEGATIVE
Neisseria gonorrhoeae, NAA: NEGATIVE
Trich vag by NAA: NEGATIVE

## 2020-05-31 LAB — IGP, APTIMA HPV: HPV Aptima: NEGATIVE

## 2020-06-01 DIAGNOSIS — Z1231 Encounter for screening mammogram for malignant neoplasm of breast: Secondary | ICD-10-CM

## 2020-06-01 DIAGNOSIS — E538 Deficiency of other specified B group vitamins: Secondary | ICD-10-CM

## 2020-06-01 DIAGNOSIS — Z124 Encounter for screening for malignant neoplasm of cervix: Secondary | ICD-10-CM

## 2020-06-01 DIAGNOSIS — T50905A Adverse effect of unspecified drugs, medicaments and biological substances, initial encounter: Secondary | ICD-10-CM

## 2020-06-01 DIAGNOSIS — D860 Sarcoidosis of lung: Secondary | ICD-10-CM

## 2020-06-01 DIAGNOSIS — G4701 Insomnia due to medical condition: Secondary | ICD-10-CM

## 2020-06-01 DIAGNOSIS — Z113 Encounter for screening for infections with a predominantly sexual mode of transmission: Secondary | ICD-10-CM

## 2020-06-01 DIAGNOSIS — R69 Illness, unspecified: Secondary | ICD-10-CM | POA: Diagnosis not present

## 2020-06-01 DIAGNOSIS — F3341 Major depressive disorder, recurrent, in partial remission: Secondary | ICD-10-CM

## 2020-06-01 DIAGNOSIS — R300 Dysuria: Secondary | ICD-10-CM

## 2020-06-01 DIAGNOSIS — M818 Other osteoporosis without current pathological fracture: Secondary | ICD-10-CM | POA: Diagnosis not present

## 2020-06-01 DIAGNOSIS — Z0001 Encounter for general adult medical examination with abnormal findings: Secondary | ICD-10-CM | POA: Diagnosis not present

## 2020-06-01 MED ORDER — CYANOCOBALAMIN 1000 MCG/ML IJ SOLN
1000.0000 ug | Freq: Once | INTRAMUSCULAR | Status: AC
  Administered 2020-06-01: 1000 ug via INTRAMUSCULAR

## 2020-06-09 ENCOUNTER — Other Ambulatory Visit: Payer: Self-pay

## 2020-06-09 ENCOUNTER — Other Ambulatory Visit
Admission: RE | Admit: 2020-06-09 | Discharge: 2020-06-09 | Disposition: A | Discharge status: Home / Self Care | Payer: 59 | Attending: Internal Medicine | Admitting: Internal Medicine

## 2020-06-09 DIAGNOSIS — Z0001 Encounter for general adult medical examination with abnormal findings: Secondary | ICD-10-CM | POA: Insufficient documentation

## 2020-06-09 DIAGNOSIS — E538 Deficiency of other specified B group vitamins: Secondary | ICD-10-CM

## 2020-06-09 DIAGNOSIS — Z113 Encounter for screening for infections with a predominantly sexual mode of transmission: Secondary | ICD-10-CM

## 2020-06-09 DIAGNOSIS — Z1231 Encounter for screening mammogram for malignant neoplasm of breast: Secondary | ICD-10-CM

## 2020-06-09 DIAGNOSIS — G4701 Insomnia due to medical condition: Secondary | ICD-10-CM | POA: Insufficient documentation

## 2020-06-09 DIAGNOSIS — D860 Sarcoidosis of lung: Secondary | ICD-10-CM | POA: Diagnosis not present

## 2020-06-09 DIAGNOSIS — Z124 Encounter for screening for malignant neoplasm of cervix: Secondary | ICD-10-CM | POA: Diagnosis not present

## 2020-06-09 DIAGNOSIS — R300 Dysuria: Secondary | ICD-10-CM

## 2020-06-09 DIAGNOSIS — T50905A Adverse effect of unspecified drugs, medicaments and biological substances, initial encounter: Secondary | ICD-10-CM

## 2020-06-09 DIAGNOSIS — M818 Other osteoporosis without current pathological fracture: Secondary | ICD-10-CM

## 2020-06-09 DIAGNOSIS — F3341 Major depressive disorder, recurrent, in partial remission: Secondary | ICD-10-CM

## 2020-06-09 DIAGNOSIS — R69 Illness, unspecified: Secondary | ICD-10-CM | POA: Diagnosis not present

## 2020-06-09 LAB — CBC WITH DIFFERENTIAL/PLATELET
Abs Immature Granulocytes: 0.03 10*3/uL (ref 0.00–0.07)
Basophils Absolute: 0.1 10*3/uL (ref 0.0–0.1)
Basophils Relative: 1 %
Eosinophils Absolute: 0.1 10*3/uL (ref 0.0–0.5)
Eosinophils Relative: 1 %
HCT: 37.4 % (ref 36.0–46.0)
Hemoglobin: 11.9 g/dL — ABNORMAL LOW (ref 12.0–15.0)
Immature Granulocytes: 0 %
Lymphocytes Relative: 20 %
Lymphs Abs: 1.5 10*3/uL (ref 0.7–4.0)
MCH: 27 pg (ref 26.0–34.0)
MCHC: 31.8 g/dL (ref 30.0–36.0)
MCV: 84.8 fL (ref 80.0–100.0)
Monocytes Absolute: 0.5 10*3/uL (ref 0.1–1.0)
Monocytes Relative: 6 %
Neutro Abs: 5.4 10*3/uL (ref 1.7–7.7)
Neutrophils Relative %: 72 %
Platelets: 229 10*3/uL (ref 150–400)
RBC: 4.41 MIL/uL (ref 3.87–5.11)
RDW: 14.6 % (ref 11.5–15.5)
WBC: 7.5 10*3/uL (ref 4.0–10.5)
nRBC: 0 % (ref 0.0–0.2)

## 2020-06-09 LAB — LIPID PANEL
Cholesterol: 216 mg/dL — ABNORMAL HIGH (ref 0–200)
HDL: 66 mg/dL (ref >40)
LDL Cholesterol: 125 mg/dL — ABNORMAL HIGH (ref 0–99)
Total CHOL/HDL Ratio: 3.3 RATIO
Triglycerides: 126 mg/dL (ref <150)
VLDL: 25 mg/dL (ref 0–40)

## 2020-06-09 LAB — TSH: TSH: 0.487 u[IU]/mL (ref 0.350–4.500)

## 2020-06-09 LAB — T4, FREE: Free T4: 0.72 ng/dL (ref 0.61–1.12)

## 2020-06-10 NOTE — Progress Notes (Signed)
Labs reviewed and will be discussed on next visit

## 2020-06-15 ENCOUNTER — Ambulatory Visit
Admission: RE | Admit: 2020-06-15 | Discharge: 2020-06-15 | Disposition: A | Discharge status: Home / Self Care | Payer: 59 | Source: Ambulatory Visit | Attending: Adult Health | Admitting: Adult Health

## 2020-06-15 ENCOUNTER — Other Ambulatory Visit: Payer: Self-pay

## 2020-06-15 DIAGNOSIS — Z1231 Encounter for screening mammogram for malignant neoplasm of breast: Secondary | ICD-10-CM

## 2020-06-15 DIAGNOSIS — Z09 Encounter for follow-up examination after completed treatment for conditions other than malignant neoplasm: Secondary | ICD-10-CM | POA: Diagnosis not present

## 2020-06-15 DIAGNOSIS — N644 Mastodynia: Secondary | ICD-10-CM | POA: Diagnosis not present

## 2020-06-24 ENCOUNTER — Other Ambulatory Visit: Payer: Self-pay

## 2020-06-24 ENCOUNTER — Ambulatory Visit: Payer: 59 | Admitting: Internal Medicine

## 2020-06-24 ENCOUNTER — Encounter: Payer: Self-pay | Admitting: Internal Medicine

## 2020-06-24 VITALS — BP 142/92 | HR 95 | Temp 97.3°F | Resp 16 | Ht 66.5 in | Wt 165.2 lb

## 2020-06-24 DIAGNOSIS — I10 Essential (primary) hypertension: Secondary | ICD-10-CM | POA: Diagnosis not present

## 2020-06-24 DIAGNOSIS — N644 Mastodynia: Secondary | ICD-10-CM

## 2020-06-24 DIAGNOSIS — D860 Sarcoidosis of lung: Secondary | ICD-10-CM | POA: Diagnosis not present

## 2020-06-24 DIAGNOSIS — F3341 Major depressive disorder, recurrent, in partial remission: Secondary | ICD-10-CM

## 2020-06-24 DIAGNOSIS — E2839 Other primary ovarian failure: Secondary | ICD-10-CM

## 2020-06-24 DIAGNOSIS — Z1231 Encounter for screening mammogram for malignant neoplasm of breast: Secondary | ICD-10-CM

## 2020-06-24 DIAGNOSIS — R69 Illness, unspecified: Secondary | ICD-10-CM | POA: Diagnosis not present

## 2020-06-24 MED ORDER — BISOPROLOL-HYDROCHLOROTHIAZIDE 2.5-6.25 MG PO TABS: ORAL_TABLET | ORAL | 6 refills | Status: DC

## 2020-06-24 MED ORDER — AZITHROMYCIN 250 MG PO TABS: ORAL_TABLET | ORAL | 0 refills | Status: DC

## 2020-06-24 NOTE — Progress Notes (Signed)
[facility] [address] [place], [place] [address]  Internal MEDICINE  Office Visit Note  Patient Name: Brittany Huynh  [id number]  [id number]  Date of Service: 06/24/2020  Chief Complaint  Patient presents with  . Follow-up    tender breasts pt just had mammogram last week, lump on arm came after last appt, refill request  . Asthma  . Anxiety  . Quality Metric Gaps    colonoscopy  . Cough    since sunday    HPI Pt is here for routine follow up, was unable to go for BMD, pt continues to have breast pain/tenderness, her mammogram is normal, she is menopausal as well. She does have sarcoidosis, has been coughing and has chest congestion. She feels like pain is worse with breathing. She has a nodule on left fore arm. Has been feeling depressed, BP is elevated as well.   Current Medication: Outpatient Encounter Medications as of 06/24/2020  Medication Sig  . albuterol (PROVENTIL) (2.5 MG/3ML) 0.083% nebulizer solution Take 3 mLs (2.5 mg total) by nebulization every 6 (six) hours as needed for wheezing or shortness of breath.  Marland Kitchen albuterol (VENTOLIN HFA) 108 (90 Base) MCG/ACT inhaler Inhale 2 puffs into the lungs every 6 (six) hours as needed for wheezing or shortness of breath.  Marland Kitchen alendronate (FOSAMAX) 70 MG tablet Take 1 tablet (70 mg total) by mouth every 7 (seven) days. Take with a full glass of water on an empty stomach.  Marland Kitchen aspirin-acetaminophen-caffeine (EXCEDRIN MIGRAINE) 250-250-65 MG per tablet Take 1 tablet by mouth every 6 (six) hours as needed for headache.  (Patient not taking: Reported on 05/27/2020)  . augmented betamethasone dipropionate (DIPROLENE-AF) 0.05 % ointment APPLY TO AFFECTED AREA TWICE A DAY (Patient taking differently: Apply 1 application topically 2 (two) times daily. )  . azithromycin (ZITHROMAX) 250 MG tablet Take one tab a day for 10 days for uri  . bisoprolol-hydrochlorothiazide (ZIAC) 2.5-6.25 MG tablet Take one tab po qd for HTN  .  celecoxib (CELEBREX) 200 MG capsule Take 1 capsule (200 mg total) by mouth daily.  Marland Kitchen dicyclomine (BENTYL) 10 MG capsule TAKE 1 CAPSULE BY MOUTH EVERY DAY  . DULoxetine (CYMBALTA) 30 MG capsule Take 1 capsule (30 mg total) by mouth daily.  . mirtazapine (REMERON) 30 MG tablet Take 1 tablet (30 mg total) by mouth at bedtime.  . predniSONE (DELTASONE) 1 MG tablet PT is tapering dose, currently taking  weekly and will step down each week by .  . predniSONE (DELTASONE) 10 MG tablet Take 1 tablet (10 mg total) by mouth daily with breakfast.  . Vitamin D, Ergocalciferol, (DRISDOL) 1.25 MG (50000 UT) CAPS capsule Take 1 capsule (50,000 Units total) by mouth every 7 (seven) days.  Marland Kitchen zolpidem (AMBIEN) 10 MG tablet Take 1 tablet (10 mg total) by mouth at bedtime as needed for sleep.   No facility-administered encounter medications on file as of 06/24/2020.    Surgical History: Past Surgical History:  Procedure Laterality Date  . ABDOMINAL HYSTERECTOMY  2009  . BREAST BIOPSY Left 02/15/2018   PREDOMINANTLY MATURE ADIPOSE TISSUE WITH VERY FOCAL AREAS OF BENIGN   . BREAST EXCISIONAL BIOPSY    . chamberlain procedure   2011  . IRRIGATION AND DEBRIDEMENT HEMATOMA Left 03/07/2018   Procedure: IRRIGATION AND DEBRIDEMENT HEMATOMA-LEFT BREAST;  Surgeon: Earline Mayotte, MD;  Location: [facility] ORS;  Service: General;  Laterality: Left;  . LUNG BIOPSY  2011  . RESECTION RIBS EXTRAPLEURAL  [id number]  Medical History: Past Medical History:  Diagnosis Date  . Anxiety   . Asthma   . Breast fibrocystic disorder 2013  . Hernia 2011   chest wall  . Lung mass 2011  . Sarcoidosis   . Shortness of breath    2011    Family History: Family History  Problem Relation Age of Onset  . Liver cancer Mother 93  . Bone cancer Maternal Aunt 60  . Breast cancer Maternal Aunt   . Breast cancer Paternal Aunt   . Kidney cancer Father   . Bone cancer Paternal Aunt 56    Social History   Socioeconomic  History  . Marital status: Single    Spouse name: Not on file  . Number of children: Not on file  . Years of education: Not on file  . Highest education level: Not on file  Occupational History  . Not on file  Tobacco Use  . Smoking status: Never Smoker  . Smokeless tobacco: Never Used  Vaping Use  . Vaping Use: Never used  Substance and Sexual Activity  . Alcohol use: Yes    Comment: on occassion  . Drug use: No  . Sexual activity: Not on file  Other Topics Concern  . Not on file  Social History Narrative  . Not on file   Social Determinants of Health   Financial Resource Strain:   . Difficulty of Paying Living Expenses: Not on file  Food Insecurity:   . Worried About Programme researcher, broadcasting/film/video in the Last Year: Not on file  . Ran Out of Food in the Last Year: Not on file  Transportation Needs:   . Lack of Transportation (Medical): Not on file  . Lack of Transportation (Non-Medical): Not on file  Physical Activity:   . Days of Exercise per Week: Not on file  . Minutes of Exercise per Session: Not on file  Stress:   . Feeling of Stress : Not on file  Social Connections:   . Frequency of Communication with Friends and Family: Not on file  . Frequency of Social Gatherings with Friends and Family: Not on file  . Attends Religious Services: Not on file  . Active Member of Clubs or Organizations: Not on file  . Attends Banker Meetings: Not on file  . Marital Status: Not on file  Intimate Partner Violence:   . Fear of Current or Ex-Partner: Not on file  . Emotionally Abused: Not on file  . Physically Abused: Not on file  . Sexually Abused: Not on file     Review of Systems  Constitutional: Negative for chills, diaphoresis and fatigue.  HENT: Negative for ear pain, postnasal drip and sinus pressure.   Eyes: Negative for photophobia, discharge, redness, itching and visual disturbance.  Respiratory: Positive for cough. Negative for shortness of breath and  wheezing.   Cardiovascular: Negative for chest pain, palpitations and leg swelling.       Breast pain and tenderness  Elevated bp  Gastrointestinal: Negative for abdominal pain, constipation, diarrhea, nausea and vomiting.  Genitourinary: Negative for dysuria and flank pain.  Musculoskeletal: Negative for arthralgias, back pain, gait problem and neck pain.  Skin: Negative for color change.  Allergic/Immunologic: Negative for environmental allergies and food allergies.  Neurological: Negative for dizziness and headaches.  Hematological: Does not bruise/bleed easily.  Psychiatric/Behavioral: Negative for agitation, behavioral problems (depression) and hallucinations.    Vital Signs: BP (!) 142/92   Pulse 95   Temp (!) 97.3  F (36.3 C)   Resp 16   Ht 5' 6.5" (1.689 m)   Wt 165 lb 3.2 oz (74.9 kg)   SpO2 98%   BMI 26.26 kg/m    Physical Exam Constitutional:      General: She is not in acute distress.    Appearance: She is well-developed. She is not diaphoretic.  HENT:     Head: Normocephalic and atraumatic.     Mouth/Throat:     Pharynx: No oropharyngeal exudate.  Eyes:     Pupils: Pupils are equal, round, and reactive to light.  Neck:     Thyroid: No thyromegaly.     Vascular: No JVD.     Trachea: No tracheal deviation.  Cardiovascular:     Rate and Rhythm: Normal rate and regular rhythm.     Heart sounds: Normal heart sounds. No murmur heard.  No friction rub. No gallop.      Comments: Chest/wall/ breast tenderness  Pulmonary:     Effort: Pulmonary effort is normal. No respiratory distress.     Breath sounds: No wheezing or rales.  Chest:     Chest wall: No tenderness.  Abdominal:     General: Bowel sounds are normal.     Palpations: Abdomen is soft.  Musculoskeletal:        General: Normal range of motion.     Cervical back: Normal range of motion and neck supple.  Lymphadenopathy:     Cervical: No cervical adenopathy.  Skin:    General: Skin is warm and  dry.  Neurological:     Mental Status: She is alert and oriented to person, place, and time.     Cranial Nerves: No cranial nerve deficit.  Psychiatric:        Behavior: Behavior normal.        Thought Content: Thought content normal.        Judgment: Judgment normal.    Assessment/Plan: 1. 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 Pleurisy, will start azithromycin for cough ( acute bronchitis) continue prednisone as before, will need to go see pulmonary at [facility]  - azithromycin (ZITHROMAX) 250 MG tablet; Take one tab a day for 10 days for uri  Dispense: 10 tablet; Refill: 0, continue albuterol as before  - Ambulatory referral to Pulmonology  2. Breast tenderness in female Normal mammogram, tenderness is more chest wall than breast tissue, can be part of sarcoid. Will refer her out. Pt is on Celebrex  - Ambulatory referral to Pulmonology  3. Recurrent major depressive disorder, in partial remission (HCC) Continue Cymbalta and Remeron as before   4. Essential hypertension, benign Will need better BP control  - bisoprolol-hydrochlorothiazide (ZIAC) 2.5-6.25 MG tablet; Take one tab po qd for HTN  Dispense: 30 tablet; Refill: 6  6. Other primary ovarian failure - DG Bone Density  General Counseling: Lonisha verbalizes understanding of the findings of todays visit and agrees with plan of treatment. I have discussed any further diagnostic evaluation that may be needed or ordered today. We also reviewed her medications today. she has been encouraged to call the office with any questions or concerns that should arise related to todays visit.   Orders Placed This Encounter  Procedures  . Ambulatory referral to Pulmonology    Meds ordered this encounter  Medications  . bisoprolol-hydrochlorothiazide (ZIAC) 2.5-6.25 MG tablet    Sig: Take one tab po qd for HTN    Dispense:  30 tablet  Refill:  6  . azithromycin (ZITHROMAX) 250 MG  tablet    Sig: Take one tab a day for 10 days for uri    Dispense:  10 tablet    Refill:  0    Total time spent:30 Minutes Time spent includes review of chart, medications, test results, and follow up plan with the patient.      Dr Lyndon Code Internal medicine

## 2020-06-26 ENCOUNTER — Telehealth: Payer: Self-pay

## 2020-06-26 NOTE — Telephone Encounter (Signed)
Called to inform Pt that her referral to [facility] went through and she could call them to confirm or wait for their call. She said she would wait for them to call her.  Fax sent 06/26/20 @ 2:45

## 2020-08-10 ENCOUNTER — Other Ambulatory Visit: Payer: Self-pay

## 2020-08-11 ENCOUNTER — Other Ambulatory Visit: Payer: Self-pay

## 2020-08-11 ENCOUNTER — Ambulatory Visit (INDEPENDENT_AMBULATORY_CARE_PROVIDER_SITE_OTHER): Payer: 59 | Admitting: Gastroenterology

## 2020-08-11 ENCOUNTER — Encounter: Payer: Self-pay | Admitting: Gastroenterology

## 2020-08-11 VITALS — BP 166/92 | HR 94 | Temp 98.1°F | Ht 66.5 in | Wt 170.2 lb

## 2020-08-11 DIAGNOSIS — D509 Iron deficiency anemia, unspecified: Secondary | ICD-10-CM | POA: Diagnosis not present

## 2020-08-11 DIAGNOSIS — Z8639 Personal history of other endocrine, nutritional and metabolic disease: Secondary | ICD-10-CM | POA: Diagnosis not present

## 2020-08-11 DIAGNOSIS — K529 Noninfective gastroenteritis and colitis, unspecified: Secondary | ICD-10-CM

## 2020-08-11 MED ORDER — CLENPIQ 10-3.5-12 MG-GM -GM/160ML PO SOLN: 320.0000 mL | Freq: Once | ORAL | 0 refills | Status: AC

## 2020-08-11 NOTE — Progress Notes (Signed)
[name], MD [address]  [address]  [place], [place] [address]  Main: [phone]  Fax: [phone]    Gastroenterology Consultation  Referring Provider:     Lyndon Code, MD Primary Care Physician:  Lyndon Code, MD Primary Gastroenterologist:  Dr. [name] Reason for Consultation:     Chronic diarrhea, abdominal bloating        HPI:   Brittany Huynh is a 54 y.o. female referred by Dr. Welton Flakes, Shannan Harper, MD  for consultation & management of chronic diarrhea.  Patient reports approximately 2 months history of nonbloody watery bowel movements anywhere from [facility] stool scale 5-7, about 5-6 times a day, not necessarily postprandial associated with lower abdominal cramps as well as bloating.  She does report urgency but did not have any fecal incontinence.  Denies any weight loss.  She denies consumption of carbonated beverages or artificial sweeteners regularly.  She is noted to have chronic iron deficiency, ferritin less than 25 and recently mild anemia, hemoglobin 11.9.  Had B12 deficiency in 2019, was receiving B12 injections, until recently.  Her B12 levels were normal in 02/2019.  She does have history of chronic migraine headaches for which she takes Excedrin intermittently.  Patient does have history of sarcoidosis.  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 Anxiety   . Asthma   . Breast fibrocystic disorder 2013  . Hernia 2011   chest wall  . Lung mass 2011  . Sarcoidosis   . Shortness of breath    2011    Past Surgical History:  Procedure Laterality Date  . ABDOMINAL HYSTERECTOMY  2009  . BREAST BIOPSY Left 02/15/2018   PREDOMINANTLY MATURE ADIPOSE TISSUE WITH VERY FOCAL AREAS OF BENIGN   .  BREAST EXCISIONAL BIOPSY    . chamberlain procedure   2011  . IRRIGATION AND DEBRIDEMENT HEMATOMA Left 03/07/2018   Procedure: IRRIGATION AND DEBRIDEMENT HEMATOMA-LEFT BREAST;  Surgeon: Earline Mayotte, MD;  Location: [facility] ORS;  Service: General;  Laterality: Left;  . LUNG BIOPSY  2011  . RESECTION RIBS EXTRAPLEURAL  [id number]    Current Outpatient Medications:  .  albuterol (PROVENTIL) (2.5 MG/3ML) 0.083% nebulizer solution, Take 3 mLs (2.5 mg total) by nebulization every 6 (six) hours as needed for wheezing or shortness of breath., Disp: 150 mL, Rfl: 1 .  albuterol (VENTOLIN HFA) 108 (90 Base) MCG/ACT inhaler, Inhale 2 puffs into the lungs every 6 (six) hours as needed for wheezing or shortness of breath., Disp: 18 g, Rfl: 3 .  aspirin-acetaminophen-caffeine (EXCEDRIN MIGRAINE) 250-250-65 MG per tablet, Take 1 tablet by mouth every 6 (six) hours as needed for headache., Disp: , Rfl:  .  augmented betamethasone dipropionate (DIPROLENE-AF) 0.05 % ointment, APPLY TO AFFECTED AREA TWICE A DAY (Patient taking differently: Apply 1 application topically 2 (two) times daily.), Disp: 50 g, Rfl: 0 .  bisoprolol-hydrochlorothiazide (ZIAC) 2.5-6.25 MG tablet, Take one tab po qd for HTN, Disp: 30 tablet, Rfl: 6 .  celecoxib (CELEBREX) 200 MG capsule, Take 1 capsule (200 mg total) by mouth daily., Disp: 90 capsule, Rfl: 3 .  dicyclomine (BENTYL) 10 MG capsule, TAKE 1 CAPSULE BY MOUTH EVERY DAY, Disp: 90 capsule, Rfl: 0 .  DULoxetine (CYMBALTA) 30 MG capsule, Take  1 capsule (30 mg total) by mouth daily., Disp: 90 capsule, Rfl: 1 .  hydroxychloroquine (PLAQUENIL) 200 MG tablet, Take 200 mg by mouth 2 (two) times daily., Disp: , Rfl:  .  mirtazapine (REMERON) 30 MG tablet, Take 1 tablet (30 mg total) by mouth at bedtime., Disp: 90 tablet, Rfl: 0 .  predniSONE (DELTASONE) 1 MG tablet, PT is tapering dose, currently taking 12mg  weekly and will step down each week by 1mg ., Disp: 60 tablet, Rfl: 3 .  predniSONE  (DELTASONE) 10 MG tablet, Take 1 tablet (10 mg total) by mouth daily with breakfast., Disp: 30 tablet, Rfl: 3 .  zolpidem (AMBIEN) 10 MG tablet, Take 1 tablet (10 mg total) by mouth at bedtime as needed for sleep., Disp: 30 tablet, Rfl: 1   Family History  Problem Relation Age of Onset  . Liver cancer Mother 38  . Bone cancer Maternal Aunt 60  . Breast cancer Maternal Aunt   . Breast cancer Paternal Aunt   . Kidney cancer Father   . Bone cancer Paternal Aunt 53     Social History   Tobacco Use  . Smoking status: Never Smoker  . Smokeless tobacco: Never Used  Vaping Use  . Vaping Use: Never used  Substance Use Topics  . Alcohol use: Yes    Comment: on occassion  . Drug use: No    Allergies as of 08/11/2020 - Review Complete 08/11/2020  Allergen Reaction Noted  . 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 Adhesive [tape] Rash 07/03/2012    Review of Systems:    All systems reviewed and negative except where noted in HPI.   Physical Exam:  BP (!) 166/92 (BP Location: Left Arm, Patient Position: Sitting, Cuff Size: Normal)   Pulse 94   Temp 98.1 F (36.7 C) (Oral)   Ht 5' 6.5" (1.689 m)   Wt 170 lb 4 oz (77.2 kg)   BMI 27.07 kg/m  No LMP recorded. Patient has had a hysterectomy.  General:   Alert,  Well-developed, well-nourished, pleasant and cooperative in NAD Head:  Normocephalic and atraumatic. Eyes:  Sclera clear, no icterus.   Conjunctiva pink. Ears:  Normal auditory acuity. Nose:  No deformity, discharge, or lesions. Mouth:  No deformity or lesions,oropharynx pink & moist. Neck:  Supple; no masses or thyromegaly. Lungs:  Respirations even and unlabored.  Clear throughout to auscultation.   No wheezes, crackles, or rhonchi. No acute distress. Heart:  Regular rate and rhythm; no murmurs, clicks, rubs, or gallops. Abdomen:  Normal bowel sounds. Soft, non-tender and moderately distended, tympanic to percussion without masses, hepatosplenomegaly or hernias noted.  No  guarding or rebound tenderness.   Rectal: Not performed Msk:  Symmetrical without gross deformities. Good, equal movement & strength bilaterally. Pulses:  Normal pulses noted. Extremities:  No clubbing or edema.  No cyanosis. Neurologic:  Alert and oriented x3;  grossly normal neurologically. Skin:  Intact without significant lesions or rashes. No jaundice. Lymph Nodes:  No significant cervical adenopathy. Psych:  Alert and cooperative. Normal mood and affect.  Imaging Studies: No recent abdominal imaging  Assessment and Plan:   Brittany Huynh is a 54 y.o. pleasant female with history of pulmonary sarcoidosis, hypertension is seen in consultation for 2 months history of nonbloody diarrhea associated with abdominal bloating and cramps as well as history of B12 and iron deficiency anemia  Chronic diarrhea Recommend GI profile PCR to rule out infection, if this is negative Recommend EGD as well as colonoscopy with gastric and  duodenal biopsies, TI evaluation and colon biopsies  History of iron and B12 deficiency anemia Recommend EGD and colonoscopy for further evaluation Trial of fusion plus, samples provided   Follow up in 6 to 8 weeks   [name], MD

## 2020-08-19 LAB — GI PROFILE, STOOL, PCR

## 2020-08-31 ENCOUNTER — Other Ambulatory Visit
Admission: RE | Admit: 2020-08-31 | Discharge: 2020-08-31 | Disposition: A | Discharge status: Home / Self Care | Payer: 59 | Source: Ambulatory Visit | Attending: Gastroenterology | Admitting: Gastroenterology

## 2020-08-31 DIAGNOSIS — Z20822 Contact with and (suspected) exposure to covid-19: Secondary | ICD-10-CM | POA: Diagnosis not present

## 2020-08-31 DIAGNOSIS — Z01818 Encounter for other preprocedural examination: Secondary | ICD-10-CM | POA: Insufficient documentation

## 2020-09-01 ENCOUNTER — Encounter: Payer: Self-pay | Admitting: Gastroenterology

## 2020-09-01 DIAGNOSIS — D869 Sarcoidosis, unspecified: Secondary | ICD-10-CM | POA: Diagnosis not present

## 2020-09-01 DIAGNOSIS — R0789 Other chest pain: Secondary | ICD-10-CM | POA: Diagnosis not present

## 2020-09-01 DIAGNOSIS — Z23 Encounter for immunization: Secondary | ICD-10-CM | POA: Diagnosis not present

## 2020-09-01 DIAGNOSIS — Z7952 Long term (current) use of systemic steroids: Secondary | ICD-10-CM | POA: Diagnosis not present

## 2020-09-01 DIAGNOSIS — R5383 Other fatigue: Secondary | ICD-10-CM | POA: Diagnosis not present

## 2020-09-01 DIAGNOSIS — R0602 Shortness of breath: Secondary | ICD-10-CM | POA: Diagnosis not present

## 2020-09-01 DIAGNOSIS — G8929 Other chronic pain: Secondary | ICD-10-CM | POA: Diagnosis not present

## 2020-09-01 DIAGNOSIS — R059 Cough, unspecified: Secondary | ICD-10-CM | POA: Diagnosis not present

## 2020-09-01 DIAGNOSIS — R519 Headache, unspecified: Secondary | ICD-10-CM | POA: Diagnosis not present

## 2020-09-01 LAB — SARS CORONAVIRUS 2 (TAT 6-24 HRS): SARS Coronavirus 2: NEGATIVE

## 2020-09-02 ENCOUNTER — Telehealth: Payer: Self-pay

## 2020-09-02 ENCOUNTER — Ambulatory Visit
Admission: RE | Admit: 2020-09-02 | Discharge: 2020-09-02 | Disposition: A | Discharge status: Home / Self Care | Payer: 59 | Source: Ambulatory Visit | Attending: Gastroenterology | Admitting: Gastroenterology

## 2020-09-02 ENCOUNTER — Ambulatory Visit: Payer: 59 | Admitting: Certified Registered Nurse Anesthetist

## 2020-09-02 ENCOUNTER — Other Ambulatory Visit: Payer: Self-pay

## 2020-09-02 ENCOUNTER — Encounter
Admission: RE | Disposition: A | Discharge status: Home / Self Care | Payer: Self-pay | Source: Ambulatory Visit | Attending: Gastroenterology

## 2020-09-02 ENCOUNTER — Encounter: Payer: Self-pay | Admitting: Gastroenterology

## 2020-09-02 DIAGNOSIS — K222 Esophageal obstruction: Secondary | ICD-10-CM | POA: Insufficient documentation

## 2020-09-02 DIAGNOSIS — K449 Diaphragmatic hernia without obstruction or gangrene: Secondary | ICD-10-CM | POA: Diagnosis not present

## 2020-09-02 DIAGNOSIS — Z7952 Long term (current) use of systemic steroids: Secondary | ICD-10-CM | POA: Insufficient documentation

## 2020-09-02 DIAGNOSIS — K259 Gastric ulcer, unspecified as acute or chronic, without hemorrhage or perforation: Secondary | ICD-10-CM | POA: Diagnosis not present

## 2020-09-02 DIAGNOSIS — K5790 Diverticulosis of intestine, part unspecified, without perforation or abscess without bleeding: Secondary | ICD-10-CM | POA: Diagnosis not present

## 2020-09-02 DIAGNOSIS — Z91018 Allergy to other foods: Secondary | ICD-10-CM | POA: Insufficient documentation

## 2020-09-02 DIAGNOSIS — K219 Gastro-esophageal reflux disease without esophagitis: Secondary | ICD-10-CM | POA: Diagnosis not present

## 2020-09-02 DIAGNOSIS — D509 Iron deficiency anemia, unspecified: Secondary | ICD-10-CM | POA: Insufficient documentation

## 2020-09-02 DIAGNOSIS — K3189 Other diseases of stomach and duodenum: Secondary | ICD-10-CM | POA: Diagnosis not present

## 2020-09-02 DIAGNOSIS — Z79899 Other long term (current) drug therapy: Secondary | ICD-10-CM | POA: Insufficient documentation

## 2020-09-02 DIAGNOSIS — K5730 Diverticulosis of large intestine without perforation or abscess without bleeding: Secondary | ICD-10-CM | POA: Insufficient documentation

## 2020-09-02 DIAGNOSIS — Z791 Long term (current) use of non-steroidal anti-inflammatories (NSAID): Secondary | ICD-10-CM | POA: Insufficient documentation

## 2020-09-02 DIAGNOSIS — Z87892 Personal history of anaphylaxis: Secondary | ICD-10-CM | POA: Insufficient documentation

## 2020-09-02 DIAGNOSIS — J45909 Unspecified asthma, uncomplicated: Secondary | ICD-10-CM | POA: Diagnosis not present

## 2020-09-02 HISTORY — PX: ESOPHAGOGASTRODUODENOSCOPY (EGD) WITH PROPOFOL: SHX5813

## 2020-09-02 HISTORY — PX: COLONOSCOPY WITH PROPOFOL: SHX5780

## 2020-09-02 SURGERY — COLONOSCOPY WITH PROPOFOL
Anesthesia: General

## 2020-09-02 MED ORDER — PROPOFOL 500 MG/50ML IV EMUL
INTRAVENOUS | Status: DC | PRN
  Administered 2020-09-02: 150 ug/kg/min via INTRAVENOUS

## 2020-09-02 MED ORDER — LIDOCAINE HCL (CARDIAC) PF 100 MG/5ML IV SOSY
PREFILLED_SYRINGE | INTRAVENOUS | Status: DC | PRN
  Administered 2020-09-02: 50 mg via INTRAVENOUS

## 2020-09-02 MED ORDER — SODIUM CHLORIDE 0.9 % IV SOLN
INTRAVENOUS | Status: DC
  Administered 2020-09-02: 1000 mL via INTRAVENOUS

## 2020-09-02 MED ORDER — OMEPRAZOLE 40 MG PO CPDR: 40.0000 mg | DELAYED_RELEASE_CAPSULE | Freq: Every day | ORAL | 2 refills | Status: DC

## 2020-09-02 MED ORDER — PROPOFOL 10 MG/ML IV BOLUS
INTRAVENOUS | Status: DC | PRN
  Administered 2020-09-02: 80 mg via INTRAVENOUS
  Administered 2020-09-02: 20 mg via INTRAVENOUS

## 2020-09-02 MED ORDER — PROPOFOL 500 MG/50ML IV EMUL
INTRAVENOUS | Status: AC
  Filled 2020-09-02: qty 50

## 2020-09-02 MED ORDER — LIDOCAINE HCL (PF) 2 % IJ SOLN
INTRAMUSCULAR | Status: AC
  Filled 2020-09-02: qty 5

## 2020-09-02 NOTE — Anesthesia Procedure Notes (Signed)
Date/Time: 09/02/2020 9:21 AM Performed by: Ginger Carne, CRNA Pre-anesthesia Checklist: Patient identified, Emergency Drugs available, Suction available, Patient being monitored and Timeout performed Patient Re-evaluated:Patient Re-evaluated prior to induction Preoxygenation: Pre-oxygenation with 100% oxygen Induction Type: IV induction

## 2020-09-02 NOTE — Anesthesia Postprocedure Evaluation (Signed)
Anesthesia Post Note  Patient: Brittany Huynh  Procedure(s) Performed: COLONOSCOPY WITH PROPOFOL (N/A ) ESOPHAGOGASTRODUODENOSCOPY (EGD) WITH PROPOFOL (N/A )  Patient location during evaluation: Endoscopy Anesthesia Type: General Level of consciousness: awake and alert Pain management: pain level controlled Vital Signs Assessment: post-procedure vital signs reviewed and stable Respiratory status: spontaneous breathing, nonlabored ventilation, respiratory function stable and patient connected to nasal cannula oxygen Cardiovascular status: blood pressure returned to baseline and stable Postop Assessment: no apparent nausea or vomiting Anesthetic complications: no   No complications documented.   Last Vitals:  Vitals:   09/02/20 1013 09/02/20 1023  BP: (!) 160/104 (!) 183/102  Pulse:    Resp:    Temp:    SpO2:      Last Pain:  Vitals:   09/02/20 1023  TempSrc:   PainSc: 0-No pain                 Cleda Mccreedy Gerasimos Plotts

## 2020-09-02 NOTE — Anesthesia Preprocedure Evaluation (Signed)
Anesthesia Evaluation  Patient identified by MRN, date of birth, ID band Patient awake    Reviewed: Allergy & Precautions, H&P , NPO status , Patient's Chart, lab work & pertinent test results  History of Anesthesia Complications Negative for: history of anesthetic complications  Airway Mallampati: III  TM Distance: >3 FB Neck ROM: full    Dental  (+) Chipped   Pulmonary neg shortness of breath, asthma ,    Pulmonary exam normal        Cardiovascular Exercise Tolerance: Good hypertension, (-) angina(-) Past MI and (-) DOE Normal cardiovascular exam     Neuro/Psych  Headaches, PSYCHIATRIC DISORDERS  Neuromuscular disease    GI/Hepatic Neg liver ROS, GERD  Medicated and Controlled,  Endo/Other  negative endocrine ROS  Renal/GU negative Renal ROS  negative genitourinary   Musculoskeletal   Abdominal   Peds  Hematology negative hematology ROS (+)   Anesthesia Other Findings Past Medical History: No date: Anxiety No date: Asthma 2013: Breast fibrocystic disorder 2011: Hernia     Comment:  chest wall 2011: Lung mass No date: Sarcoidosis No date: Shortness of breath     Comment:  2011  Past Surgical History: 2009: ABDOMINAL HYSTERECTOMY 02/15/2018: BREAST BIOPSY; Left     Comment:  PREDOMINANTLY MATURE ADIPOSE TISSUE WITH VERY FOCAL               AREAS OF BENIGN  No date: BREAST EXCISIONAL BIOPSY 2011: chamberlain procedure  03/07/2018: IRRIGATION AND DEBRIDEMENT HEMATOMA; Left     Comment:  Procedure: IRRIGATION AND DEBRIDEMENT HEMATOMA-LEFT               BREAST;  Surgeon: Earline Mayotte, MD;  Location: [facility]              ORS;  Service: General;  Laterality: Left; 2011: LUNG BIOPSY 20113: RESECTION RIBS EXTRAPLEURAL  BMI    Body Mass Index: 26.47 kg/m      Reproductive/Obstetrics negative OB ROS                             Anesthesia Physical Anesthesia Plan  ASA:  III  Anesthesia Plan: General   Post-op Pain Management:    Induction: Intravenous  PONV Risk Score and Plan: Propofol infusion and TIVA  Airway Management Planned: Natural Airway and Nasal Cannula  Additional Equipment:   Intra-op Plan:   Post-operative Plan:   Informed Consent: I have reviewed the patients History and Physical, chart, labs and discussed the procedure including the risks, benefits and alternatives for the proposed anesthesia with the patient or authorized representative who has indicated his/her understanding and acceptance.     Dental Advisory Given  Plan Discussed with: Anesthesiologist, CRNA and Surgeon  Anesthesia Plan Comments: (Patient consented for risks of anesthesia including but not limited to:  - adverse reactions to medications - risk of airway placement if required - damage to eyes, teeth, lips or other oral mucosa - nerve damage due to positioning  - sore throat or hoarseness - Damage to heart, brain, nerves, lungs, other parts of body or loss of life  Patient voiced understanding.)        Anesthesia Quick Evaluation

## 2020-09-02 NOTE — Telephone Encounter (Signed)
Place referral

## 2020-09-02 NOTE — H&P (Signed)
Brittany Repress, MD [address]  [address]  [place], [place] [address]  Main: [phone]  Fax: [phone] Pager: [phone]  Primary Care Physician:  Brittany Code, MD Primary Gastroenterologist:  Dr. Arlyss Huynh  Pre-Procedure History & Physical: HPI:  Brittany Huynh is a 55 y.o. female is here for an endoscopy and colonoscopy.   Past Medical History:  Diagnosis Date  . Anxiety   . Asthma   . Breast fibrocystic disorder 2013  . Hernia 2011   chest wall  . Lung mass 2011  . Sarcoidosis   . Shortness of breath    2011    Past Surgical History:  Procedure Laterality Date  . ABDOMINAL HYSTERECTOMY  2009  . BREAST BIOPSY Left 02/15/2018   PREDOMINANTLY MATURE ADIPOSE TISSUE WITH VERY FOCAL AREAS OF BENIGN   . BREAST EXCISIONAL BIOPSY    . chamberlain procedure   2011  . IRRIGATION AND DEBRIDEMENT HEMATOMA Left 03/07/2018   Procedure: IRRIGATION AND DEBRIDEMENT HEMATOMA-LEFT BREAST;  Surgeon: Brittany Mayotte, MD;  Location: [facility] ORS;  Service: General;  Laterality: Left;  . LUNG BIOPSY  2011  . RESECTION RIBS EXTRAPLEURAL  [id number]    Prior to Admission medications   Medication Sig Start Date End Date Taking? Authorizing Provider  albuterol (PROVENTIL) (2.5 MG/3ML) 0.083% nebulizer solution Take 3 mLs (2.5 mg total) by nebulization every 6 (six) hours as needed for wheezing or shortness of breath. 10/09/19   Brittany Acosta, NP  albuterol (VENTOLIN HFA) 108 (90 Base) MCG/ACT inhaler Inhale 2 puffs into the lungs every 6 (six) hours as needed for wheezing or shortness of breath. 01/20/20   Brittany Acosta, NP  aspirin-acetaminophen-caffeine (EXCEDRIN MIGRAINE) [phone] MG per tablet Take 1 tablet by mouth every 6 (six) hours as needed for headache.    [provider]  augmented betamethasone dipropionate (DIPROLENE-AF) 0.05 % ointment APPLY TO AFFECTED AREA TWICE A DAY Patient taking differently: Apply 1 application topically 2 (two) times daily.  09/02/19   Brittany Code, MD  bisoprolol-hydrochlorothiazide [facility]) 2.5-6.25 MG tablet Take one tab po qd for HTN 06/24/20   Brittany Code, MD  celecoxib (CELEBREX) 200 MG capsule Take 1 capsule (200 mg total) by mouth daily. 04/20/20   Brittany Acosta, NP  dicyclomine (BENTYL) 10 MG capsule TAKE 1 CAPSULE BY MOUTH EVERY DAY 04/20/20   Brittany Acosta, NP  DULoxetine (CYMBALTA) 30 MG capsule Take 1 capsule (30 mg total) by mouth daily. 04/20/20   Brittany Acosta, NP  hydroxychloroquine (PLAQUENIL) 200 MG tablet Take 200 mg by mouth 2 (two) times daily. 02/25/20   [provider]  mirtazapine (REMERON) 30 MG tablet Take 1 tablet (30 mg total) by mouth at bedtime. 04/20/20   Brittany Acosta, NP  predniSONE (DELTASONE) 1 MG tablet PT is tapering dose, currently taking 12mg  weekly and will step down each week by 1mg . 04/20/20   Brittany Huynh, , NP  predniSONE (DELTASONE) 10 MG tablet Take 1 tablet (10 mg total) by mouth daily with breakfast. 04/20/20   Brittany North, NP  zolpidem (AMBIEN) 10 MG tablet Take 1 tablet (10 mg total) by mouth at bedtime as needed for sleep. 04/20/20 06/19/20  04/22/20, NP    Allergies as of 08/11/2020 - Review Complete 08/11/2020  Allergen Reaction Noted  . 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 Adhesive [tape] Rash 07/03/2012    Family History  Problem Relation Age of Onset  . Liver cancer  Mother 85  . Bone cancer Maternal Aunt 60  . Breast cancer Maternal Aunt   . Breast cancer Paternal Aunt   . Kidney cancer Father   . Bone cancer Paternal Aunt 109    Social History   Socioeconomic History  . Marital status: Divorced    Spouse name: Not on file  . Number of children: Not on file  . Years of education: Not on file  . Highest education level: Not on file  Occupational History  . Not on file  Tobacco Use  . Smoking status: Never Smoker  . Smokeless tobacco: Never Used  Vaping Use  . Vaping Use: Never used  Substance and Sexual Activity  .  Alcohol use: Yes    Comment: on occassion  . Drug use: No  . Sexual activity: Not on file  Other Topics Concern  . Not on file  Social History Narrative  . Not on file   Social Determinants of Health   Financial Resource Strain: Not on file  Food Insecurity: Not on file  Transportation Needs: Not on file  Physical Activity: Not on file  Stress: Not on file  Social Connections: Not on file  Intimate Partner Violence: Not on file    Review of Systems: See HPI, otherwise negative ROS  Physical Exam: BP (!) 155/94   Pulse 95   Temp (!) 96.4 F (35.8 C) (Temporal)   Resp 18   Ht 5\' 7"  (1.702 m)   Wt 76.7 kg   SpO2 100%   BMI 26.47 kg/m  General:   Alert,  pleasant and cooperative in NAD Head:  Normocephalic and atraumatic. Neck:  Supple; no masses or thyromegaly. Lungs:  Clear throughout to auscultation.    Heart:  Regular rate and rhythm. Abdomen:  Soft, nontender and nondistended. Normal bowel sounds, without guarding, and without rebound.   Neurologic:  Alert and  oriented x4;  grossly normal neurologically.  Impression/Plan: Brittany Huynh is here for an endoscopy and colonoscopy to be performed for chronic diarrhea, IDA  Risks, benefits, limitations, and alternatives regarding  endoscopy and colonoscopy have been reviewed with the patient.  Questions have been answered.  All parties agreeable.   Windy Canny, MD  09/02/2020, 9:14 AM

## 2020-09-02 NOTE — Op Note (Signed)
[facility] Gastroenterology Patient Name: Brittany Huynh Procedure Date: 09/02/2020 9:19 AM MRN: [id number] Account #: [id number] Date of Birth: 1965-10-02 Admit Type: Outpatient Age: 55 Room: [facility] ENDO ROOM 4 Gender: Female Note Status: Finalized Procedure:             Upper GI endoscopy Indications:           Unexplained iron deficiency anemia Providers:             Lin Landsman MD, MD Referring MD:          Angus Seller Medicines:             General Anesthesia Complications:         No immediate complications. Estimated blood loss: None. Procedure:             Pre-Anesthesia Assessment:                        - Prior to the procedure, a History and Physical was                         performed, and patient medications and allergies were                         reviewed. The patient is competent. The risks and                         benefits of the procedure and the sedation options and                         risks were discussed with the patient. All questions                         were answered and informed consent was obtained.                         Patient identification and proposed procedure were                         verified by the physician, the nurse, the                         anesthesiologist, the anesthetist and the technician                         in the pre-procedure area in the procedure room in the                         endoscopy suite. Mental Status Examination: alert and                         oriented. Airway Examination: normal oropharyngeal                         airway and neck mobility. Respiratory Examination:                         clear to auscultation. CV Examination: normal.  Prophylactic Antibiotics: The patient does not require                         prophylactic antibiotics. Prior Anticoagulants: The                         patient has taken no previous anticoagulant or                          antiplatelet agents. ASA Grade Assessment: III - A                         patient with severe systemic disease. After reviewing                         the risks and benefits, the patient was deemed in                         satisfactory condition to undergo the procedure. The                         anesthesia plan was to use general anesthesia.                         Immediately prior to administration of medications,                         the patient was re-assessed for adequacy to receive                         sedatives. The heart rate, respiratory rate, oxygen                         saturations, blood pressure, adequacy of pulmonary                         ventilation, and response to care were monitored                         throughout the procedure. The physical status of the                         patient was re-assessed after the procedure.                        The Endoscope was introduced through the mouth, and                         advanced to the second part of duodenum. The upper GI                         endoscopy was accomplished without difficulty. The                         patient tolerated the procedure well. The patient                         tolerated the procedure well. Findings:  The duodenal bulb and second portion of the duodenum were normal.       Biopsies for histology were taken with a cold forceps for evaluation of       celiac disease.      Multiple dispersed 5 mm erosions with no bleeding and no stigmata of       recent bleeding were found in the gastric antrum and in the prepyloric       region of the stomach. Biopsies were taken with a cold forceps for       Helicobacter pylori testing.      The cardia, gastric fundus and gastric body were normal. Biopsies were       taken with a cold forceps for Helicobacter pylori testing.      A small hiatal hernia was present.      Esophagogastric landmarks were identified: the  gastroesophageal junction       was found at 36 cm from the incisors.      A widely patent and non-obstructing Schatzki ring was found at the       gastroesophageal junction. Impression:            - Normal duodenal bulb and second portion of the                         duodenum. Biopsied.                        - Erosive gastropathy with no bleeding and no stigmata                         of recent bleeding. Biopsied.                        - Normal cardia, gastric fundus and gastric body.                         Biopsied.                        - Small hiatal hernia.                        - Esophagogastric landmarks identified.                        - Widely patent and non-obstructing Schatzki ring. Recommendation:        - Await pathology results.                        - Follow an antireflux regimen.                        - Use Prilosec (omeprazole) 40 mg PO BID for 2 months.                        - No ibuprofen, naproxen, or other non-steroidal                         anti-inflammatory drugs. Procedure Code(s):     --- Professional ---  64403, Esophagogastroduodenoscopy, flexible,                         transoral; with biopsy, single or multiple Diagnosis Code(s):     --- Professional ---                        K31.89, Other diseases of stomach and duodenum                        K44.9, Diaphragmatic hernia without obstruction or                         gangrene                        K22.2, Esophageal obstruction                        D50.9, Iron deficiency anemia, unspecified CPT copyright 2019 American Medical Association. All rights reserved. The codes documented in this report are preliminary and upon coder review may  be revised to meet current compliance requirements. Dr. Libby Maw Toney Reil MD, MD 09/02/2020 9:40:28 AM This report has been signed electronically. Number of Addenda: 0 Note Initiated On: 09/02/2020 9:19 AM Estimated  Blood Loss:  Estimated blood loss: none.      [facility]

## 2020-09-02 NOTE — Op Note (Signed)
[facility] Gastroenterology Patient Name: Brittany Huynh Procedure Date: 09/02/2020 9:40 AM MRN: [id number] Account #: [id number] Date of Birth: Jun 04, 1966 Admit Type: Outpatient Age: 55 Room: [facility] ENDO ROOM 4 Gender: Female Note Status: Finalized Procedure:             Colonoscopy Indications:           Unexplained iron deficiency anemia Providers:             Toney Reil MD, MD Medicines:             General Anesthesia Complications:         No immediate complications. Estimated blood loss: None. Procedure:             Pre-Anesthesia Assessment:                        - Prior to the procedure, a History and Physical was                         performed, and patient medications and allergies were                         reviewed. The patient is competent. The risks and                         benefits of the procedure and the sedation options and                         risks were discussed with the patient. All questions                         were answered and informed consent was obtained.                         Patient identification and proposed procedure were                         verified by the physician, the nurse, the                         anesthesiologist, the anesthetist and the technician                         in the pre-procedure area in the procedure room in the                         endoscopy suite. Mental Status Examination: alert and                         oriented. Airway Examination: normal oropharyngeal                         airway and neck mobility. Respiratory Examination:                         clear to auscultation. CV Examination: normal.                         Prophylactic Antibiotics: The patient does not require  prophylactic antibiotics. Prior Anticoagulants: The                         patient has taken no previous anticoagulant or                         antiplatelet agents. ASA Grade  Assessment: III - A                         patient with severe systemic disease. After reviewing                         the risks and benefits, the patient was deemed in                         satisfactory condition to undergo the procedure. The                         anesthesia plan was to use general anesthesia.                         Immediately prior to administration of medications,                         the patient was re-assessed for adequacy to receive                         sedatives. The heart rate, respiratory rate, oxygen                         saturations, blood pressure, adequacy of pulmonary                         ventilation, and response to care were monitored                         throughout the procedure. The physical status of the                         patient was re-assessed after the procedure.                        After obtaining informed consent, the colonoscope was                         passed under direct vision. Throughout the procedure,                         the patient's blood pressure, pulse, and oxygen                         saturations were monitored continuously. The                         Colonoscope was introduced through the anus and                         advanced to the the terminal ileum, with  identification of the appendiceal orifice and IC                         valve. The colonoscopy was performed with moderate                         difficulty due to inadequate bowel prep and                         significant looping. Successful completion of the                         procedure was aided by applying abdominal pressure.                         The patient tolerated the procedure well. The quality                         of the bowel preparation was fair. Findings:      The perianal and digital rectal examinations were normal. Pertinent       negatives include normal sphincter tone and no palpable  rectal lesions.      The terminal ileum appeared normal.      Multiple diverticula were found in the sigmoid colon and descending       colon.      The entire examined colon appeared normal. Impression:            - Preparation of the colon was fair.                        - The examined portion of the ileum was normal.                        - Diverticulosis in the sigmoid colon and in the                         descending colon.                        - The entire examined colon is normal.                        - No specimens collected. Recommendation:        - Discharge patient to home (with escort).                        - Resume previous diet today.                        - Continue present medications.                        - Repeat colonoscopy in 5 years for screening purposes. Procedure Code(s):     --- Professional ---                        [phone], Colonoscopy, flexible; diagnostic, including                         collection of specimen(s) by  brushing or washing, when                         performed (separate procedure) Diagnosis Code(s):     --- Professional ---                        D50.9, Iron deficiency anemia, unspecified                        K57.30, Diverticulosis of large intestine without                         perforation or abscess without bleeding CPT copyright 2019 American Medical Association. All rights reserved. The codes documented in this report are preliminary and upon coder review may  be revised to meet current compliance requirements. Dr. Libby Maw Toney Reil MD, MD 09/02/2020 10:02:02 AM This report has been signed electronically. Number of Addenda: 0 Note Initiated On: 09/02/2020 9:40 AM Scope Withdrawal Time: 0 hours 10 minutes 22 seconds  Total Procedure Duration: 0 hours 14 minutes 45 seconds  Estimated Blood Loss:  Estimated blood loss: none.      [facility]

## 2020-09-02 NOTE — Telephone Encounter (Signed)
-----   Message from Toney Reil, MD sent at 09/02/2020 10:16 AM EST ----- Regarding: referral to hematology Please refer her to Hematology for IDA  RV

## 2020-09-02 NOTE — Transfer of Care (Signed)
Immediate Anesthesia Transfer of Care Note  Patient: Brittany Huynh  Procedure(s) Performed: COLONOSCOPY WITH PROPOFOL (N/A ) ESOPHAGOGASTRODUODENOSCOPY (EGD) WITH PROPOFOL (N/A )  Patient Location: PACU  Anesthesia Type:General  Level of Consciousness: awake and drowsy  Airway & Oxygen Therapy: Patient Spontanous Breathing  Post-op Assessment: Report given to RN and Post -op Vital signs reviewed and stable  Post vital signs: Reviewed and stable  Last Vitals:  Vitals Value Taken Time  BP 135/93 09/02/20 1005  Temp 36.4 C 09/02/20 1003  Pulse 86 09/02/20 1005  Resp 17 09/02/20 1005  SpO2 100 % 09/02/20 1005  Vitals shown include unvalidated device data.  Last Pain:  Vitals:   09/02/20 1003  TempSrc: Temporal  PainSc: Asleep         Complications: No complications documented.

## 2020-09-03 LAB — SURGICAL PATHOLOGY

## 2020-09-07 ENCOUNTER — Encounter: Payer: Self-pay | Admitting: Gastroenterology

## 2020-09-07 DIAGNOSIS — J8410 Pulmonary fibrosis, unspecified: Secondary | ICD-10-CM | POA: Diagnosis not present

## 2020-09-10 ENCOUNTER — Inpatient Hospital Stay: Payer: 59

## 2020-09-10 ENCOUNTER — Encounter: Payer: Self-pay | Admitting: Hospice and Palliative Medicine

## 2020-09-10 ENCOUNTER — Other Ambulatory Visit: Payer: Self-pay

## 2020-09-10 ENCOUNTER — Ambulatory Visit: Payer: 59 | Admitting: Hospice and Palliative Medicine

## 2020-09-10 ENCOUNTER — Inpatient Hospital Stay: Payer: 59 | Admitting: Oncology

## 2020-09-10 VITALS — Temp 101.1°F | Ht 66.0 in | Wt 160.0 lb

## 2020-09-10 DIAGNOSIS — U071 COVID-19: Secondary | ICD-10-CM | POA: Diagnosis not present

## 2020-09-10 DIAGNOSIS — D860 Sarcoidosis of lung: Secondary | ICD-10-CM | POA: Diagnosis not present

## 2020-09-10 MED ORDER — BENZONATATE 100 MG PO CAPS: 100.0000 mg | ORAL_CAPSULE | Freq: Two times a day (BID) | ORAL | 0 refills | Status: DC | PRN

## 2020-09-10 MED ORDER — AZITHROMYCIN 250 MG PO TABS: ORAL_TABLET | ORAL | 0 refills | Status: DC

## 2020-09-10 NOTE — Progress Notes (Signed)
[facility] [address] [place], [place] [address]  Internal MEDICINE  Telephone Visit  Patient Name: Brittany Huynh  [id number]  [id number]  Date of Service: 09/10/2020  I connected with the patient at 1010 by telephone and verified the patients identity using two identifiers.   I discussed the limitations, risks, security and privacy concerns of performing an evaluation and management service by telephone and the availability of in person appointments. I also discussed with the patient that there may be a patient responsible charge related to the service.  The patient expressed understanding and agrees to proceed.    Chief Complaint  Patient presents with  . Telephone Assessment    [phone]   . Telephone Screen  . Nasal Congestion    Symptoms Start yesterday  . Diarrhea  . Nausea  . Sinusitis  . Generalized Body Aches    HPI Patient is being seen via virtual context for sick visit Tested positive for COVID Tuesday, c/o chills, body aches, diarrhea, sore throat, loss of taste and smell She is coughing which is productive and some shortness of breath History of sarcoidosis--seen by [facility], strongly encouraged her to taper down her daily Prednisone--currently taking 12 mg daily  She has been using her nebulizer for shortness of breath and coughing Has been febrile Unable to monitor her oxygen levels at home--does not have pulse oximetry   Current Medication: Outpatient Encounter Medications as of 09/10/2020  Medication Sig  . albuterol (PROVENTIL) (2.5 MG/3ML) 0.083% nebulizer solution Take 3 mLs (2.5 mg total) by nebulization every 6 (six) hours as needed for wheezing or shortness of breath.  Marland Kitchen albuterol (VENTOLIN HFA) 108 (90 Base) MCG/ACT inhaler Inhale 2 puffs into the lungs every 6 (six) hours as needed for wheezing or shortness of breath.  Marland Kitchen aspirin-acetaminophen-caffeine (EXCEDRIN MIGRAINE) 250-250-65 MG per tablet Take 1 tablet by mouth  every 6 (six) hours as needed for headache.  Marland Kitchen azithromycin (ZITHROMAX Z-PAK) 250 MG tablet Take two 250 mg tablets on day one followed by one 250 mg tablet each day for four days.  . benzonatate (TESSALON) 100 MG capsule Take 1 capsule (100 mg total) by mouth 2 (two) times daily as needed for cough.  . bisoprolol-hydrochlorothiazide (ZIAC) 2.5-6.25 MG tablet Take one tab po qd for HTN  . celecoxib (CELEBREX) 200 MG capsule Take 1 capsule (200 mg total) by mouth daily.  Marland Kitchen dicyclomine (BENTYL) 10 MG capsule TAKE 1 CAPSULE BY MOUTH EVERY DAY  . DULoxetine (CYMBALTA) 30 MG capsule Take 1 capsule (30 mg total) by mouth daily.  . hydroxychloroquine (PLAQUENIL) 200 MG tablet Take 200 mg by mouth 2 (two) times daily.  . mirtazapine (REMERON) 30 MG tablet Take 1 tablet (30 mg total) by mouth at bedtime.  Marland Kitchen omeprazole (PRILOSEC) 40 MG capsule Take 1 capsule (40 mg total) by mouth daily before breakfast.  . predniSONE (DELTASONE) 1 MG tablet PT is tapering dose, currently taking 12mg  weekly and will step down each week by 1mg .  . predniSONE (DELTASONE) 10 MG tablet Take 1 tablet (10 mg total) by mouth daily with breakfast.  . augmented betamethasone dipropionate (DIPROLENE-AF) 0.05 % ointment APPLY TO AFFECTED AREA TWICE A DAY (Patient taking differently: Apply 1 application topically 2 (two) times daily.)  . zolpidem (AMBIEN) 10 MG tablet Take 1 tablet (10 mg total) by mouth at bedtime as needed for sleep.   No facility-administered encounter medications on file as of 09/10/2020.    Surgical History: Past Surgical  History:  Procedure Laterality Date  . ABDOMINAL HYSTERECTOMY  2009  . BREAST BIOPSY Left 02/15/2018   PREDOMINANTLY MATURE ADIPOSE TISSUE WITH VERY FOCAL AREAS OF BENIGN   . BREAST EXCISIONAL BIOPSY    . chamberlain procedure   2011  . COLONOSCOPY WITH PROPOFOL N/A 09/02/2020   Procedure: COLONOSCOPY WITH PROPOFOL;  Surgeon: Toney Reil, MD;  Location: [facility] ENDOSCOPY;  Service:  Gastroenterology;  Laterality: N/A;  . ESOPHAGOGASTRODUODENOSCOPY (EGD) WITH PROPOFOL N/A 09/02/2020   Procedure: ESOPHAGOGASTRODUODENOSCOPY (EGD) WITH PROPOFOL;  Surgeon: Toney Reil, MD;  Location: [facility] ENDOSCOPY;  Service: Gastroenterology;  Laterality: N/A;  . IRRIGATION AND DEBRIDEMENT HEMATOMA Left 03/07/2018   Procedure: IRRIGATION AND DEBRIDEMENT HEMATOMA-LEFT BREAST;  Surgeon: Earline Mayotte, MD;  Location: [facility] ORS;  Service: General;  Laterality: Left;  . LUNG BIOPSY  2011  . RESECTION RIBS EXTRAPLEURAL  [id number]    Medical History: Past Medical History:  Diagnosis Date  . Anxiety   . Asthma   . Breast fibrocystic disorder 2013  . Hernia 2011   chest wall  . Lung mass 2011  . Sarcoidosis   . Shortness of breath    2011    Family History: Family History  Problem Relation Age of Onset  . Liver cancer Mother 37  . Bone cancer Maternal Aunt 60  . Breast cancer Maternal Aunt   . Breast cancer Paternal Aunt   . Kidney cancer Father   . Bone cancer Paternal Aunt 62    Social History   Socioeconomic History  . Marital status: Divorced    Spouse name: Not on file  . Number of children: Not on file  . Years of education: Not on file  . Highest education level: Not on file  Occupational History  . Not on file  Tobacco Use  . Smoking status: Never Smoker  . Smokeless tobacco: Never Used  Vaping Use  . Vaping Use: Never used  Substance and Sexual Activity  . Alcohol use: Yes    Comment: on occassion  . Drug use: No  . Sexual activity: Not on file  Other Topics Concern  . Not on file  Social History Narrative  . Not on file   Social Determinants of Health   Financial Resource Strain: Not on file  Food Insecurity: Not on file  Transportation Needs: Not on file  Physical Activity: Not on file  Stress: Not on file  Social Connections: Not on file  Intimate Partner Violence: Not on file   Review of Systems  Constitutional: Positive for chills,  fatigue and fever. Negative for diaphoresis.  HENT: Positive for congestion. Negative for ear pain, postnasal drip and sinus pressure.   Eyes: Negative for photophobia, discharge, redness, itching and visual disturbance.  Respiratory: Positive for cough and shortness of breath. Negative for wheezing.   Cardiovascular: Negative for chest pain, palpitations and leg swelling.  Gastrointestinal: Positive for diarrhea. Negative for abdominal pain, constipation, nausea and vomiting.  Genitourinary: Negative for dysuria and flank pain.  Musculoskeletal: Negative for arthralgias, back pain, gait problem and neck pain.  Skin: Negative for color change.  Allergic/Immunologic: Negative for environmental allergies and food allergies.  Neurological: Negative for dizziness and headaches.  Hematological: Does not bruise/bleed easily.  Psychiatric/Behavioral: Negative for agitation, behavioral problems (depression) and hallucinations.    Vital Signs: Temp (!) 101.1 F (38.4 C)   Ht 5\' 6"  (1.676 m)   Wt 160 lb (72.6 kg)   BMI 25.82 kg/m    Observation/Objective: Respiratory  distress not observed. Coughing and congestion in voice noted,  Assessment/Plan: 1. Acute COVID-19 May use Tessalon for relief of coughing Encouraged to drink plenty of fluids and stay hydrated, control fevers and body aches with acetaminophen Also discussed prone positioning as she can tolerate Strongly encouraged to purchase pulse oximeter to be able to monitor oxygen levels, advised to go to hospital if levels drop below 90% - benzonatate (TESSALON) 100 MG capsule; Take 1 capsule (100 mg total) by mouth 2 (two) times daily as needed for cough.  Dispense: 20 capsule; Refill: 0  2. 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  Dispense: 6 each; Refill: 0  General Counseling: Brittany Huynh verbalizes understanding of the findings of today's phone visit and agrees with plan of treatment. I have discussed any further diagnostic evaluation that may be needed or ordered today. We also reviewed her medications today. she has been encouraged to call the office with any questions or concerns that should arise related to todays visit.   Meds ordered this encounter  Medications  . azithromycin (ZITHROMAX Z-PAK) 250 MG tablet    Sig: Take two 250 mg tablets on day one followed by one 250 mg tablet each day for four days.    Dispense:  6 each    Refill:  0  . benzonatate (TESSALON) 100 MG capsule    Sig: Take 1 capsule (100 mg total) by mouth 2 (two) times daily as needed for cough.    Dispense:  20 capsule    Refill:  0     Time spent: 30 Minutes Time spent includes review of chart, medications, test results and follow-up plan with the patient.  Lubertha Basque Brittany Huynh AGNP-C Internal medicine

## 2020-09-11 ENCOUNTER — Telehealth: Payer: Self-pay

## 2020-09-11 ENCOUNTER — Other Ambulatory Visit: Payer: Self-pay | Admitting: Hospice and Palliative Medicine

## 2020-09-11 DIAGNOSIS — R059 Cough, unspecified: Secondary | ICD-10-CM

## 2020-09-11 NOTE — Telephone Encounter (Signed)
Called PT to see how she was doing she stated she's still feeling bad, has a cough, chills, body aches and short of breath as per Ladona Ridgel ordered chest X-Ray advised PT to go get the xray when she feels able too.

## 2020-09-12 ENCOUNTER — Emergency Department: Payer: 59

## 2020-09-12 ENCOUNTER — Encounter: Payer: Self-pay | Admitting: Emergency Medicine

## 2020-09-12 ENCOUNTER — Other Ambulatory Visit: Payer: Self-pay

## 2020-09-12 DIAGNOSIS — J1282 Pneumonia due to coronavirus disease 2019: Secondary | ICD-10-CM | POA: Diagnosis present

## 2020-09-12 DIAGNOSIS — D869 Sarcoidosis, unspecified: Secondary | ICD-10-CM | POA: Diagnosis present

## 2020-09-12 DIAGNOSIS — M069 Rheumatoid arthritis, unspecified: Secondary | ICD-10-CM | POA: Diagnosis present

## 2020-09-12 DIAGNOSIS — U071 COVID-19: Principal | ICD-10-CM | POA: Diagnosis present

## 2020-09-12 DIAGNOSIS — Z7952 Long term (current) use of systemic steroids: Secondary | ICD-10-CM

## 2020-09-12 DIAGNOSIS — K219 Gastro-esophageal reflux disease without esophagitis: Secondary | ICD-10-CM | POA: Diagnosis present

## 2020-09-12 DIAGNOSIS — Z7982 Long term (current) use of aspirin: Secondary | ICD-10-CM

## 2020-09-12 DIAGNOSIS — J90 Pleural effusion, not elsewhere classified: Secondary | ICD-10-CM | POA: Diagnosis not present

## 2020-09-12 DIAGNOSIS — Z79899 Other long term (current) drug therapy: Secondary | ICD-10-CM

## 2020-09-12 DIAGNOSIS — F32A Depression, unspecified: Secondary | ICD-10-CM | POA: Diagnosis present

## 2020-09-12 DIAGNOSIS — J45909 Unspecified asthma, uncomplicated: Secondary | ICD-10-CM | POA: Diagnosis present

## 2020-09-12 DIAGNOSIS — R0602 Shortness of breath: Secondary | ICD-10-CM | POA: Diagnosis not present

## 2020-09-12 DIAGNOSIS — F411 Generalized anxiety disorder: Secondary | ICD-10-CM | POA: Diagnosis present

## 2020-09-12 DIAGNOSIS — I10 Essential (primary) hypertension: Secondary | ICD-10-CM | POA: Diagnosis present

## 2020-09-12 LAB — BASIC METABOLIC PANEL
Anion gap: 11 (ref 5–15)
BUN: 10 mg/dL (ref 6–20)
CO2: 21 mmol/L — ABNORMAL LOW (ref 22–32)
Calcium: 8.6 mg/dL — ABNORMAL LOW (ref 8.9–10.3)
Chloride: 105 mmol/L (ref 98–111)
Creatinine, Ser: 0.82 mg/dL (ref 0.44–1.00)
GFR, Estimated: >60 mL/min (ref >60)
Glucose, Bld: 89 mg/dL (ref 70–99)
Potassium: 3.5 mmol/L (ref 3.5–5.1)
Sodium: 137 mmol/L (ref 135–145)

## 2020-09-12 LAB — CBC
HCT: 43.8 % (ref 36.0–46.0)
Hemoglobin: 14.2 g/dL (ref 12.0–15.0)
MCH: 26.8 pg (ref 26.0–34.0)
MCHC: 32.4 g/dL (ref 30.0–36.0)
MCV: 82.6 fL (ref 80.0–100.0)
Platelets: 149 10*3/uL — ABNORMAL LOW (ref 150–400)
RBC: 5.3 MIL/uL — ABNORMAL HIGH (ref 3.87–5.11)
RDW: 14.1 % (ref 11.5–15.5)
WBC: 4.8 10*3/uL (ref 4.0–10.5)
nRBC: 0 % (ref 0.0–0.2)

## 2020-09-12 MED ORDER — ACETAMINOPHEN 500 MG PO TABS
1000.0000 mg | ORAL_TABLET | Freq: Once | ORAL | Status: AC
  Administered 2020-09-12: 1000 mg via ORAL
  Filled 2020-09-12: qty 2

## 2020-09-12 NOTE — ED Triage Notes (Signed)
Pt reports she was diagnosed with COVID on 1/4 and her PCP told her to come to ED if oxygen levels dropped below 90 and tonight pt had reading at home of 74%. Pt has Sarcoidosis. Pt in triage at 99% room air.

## 2020-09-13 ENCOUNTER — Inpatient Hospital Stay
Admission: EM | Admit: 2020-09-13 | Discharge: 2020-09-14 | DRG: 177 | Disposition: A | Discharge status: Home / Self Care | Payer: 59 | Attending: Internal Medicine | Admitting: Internal Medicine

## 2020-09-13 DIAGNOSIS — J1282 Pneumonia due to coronavirus disease 2019: Secondary | ICD-10-CM

## 2020-09-13 DIAGNOSIS — M064 Inflammatory polyarthropathy: Secondary | ICD-10-CM

## 2020-09-13 DIAGNOSIS — M069 Rheumatoid arthritis, unspecified: Secondary | ICD-10-CM | POA: Diagnosis not present

## 2020-09-13 DIAGNOSIS — I10 Essential (primary) hypertension: Secondary | ICD-10-CM | POA: Diagnosis present

## 2020-09-13 DIAGNOSIS — K219 Gastro-esophageal reflux disease without esophagitis: Secondary | ICD-10-CM

## 2020-09-13 DIAGNOSIS — F32A Depression, unspecified: Secondary | ICD-10-CM | POA: Diagnosis present

## 2020-09-13 DIAGNOSIS — Z7952 Long term (current) use of systemic steroids: Secondary | ICD-10-CM | POA: Diagnosis not present

## 2020-09-13 DIAGNOSIS — F411 Generalized anxiety disorder: Secondary | ICD-10-CM | POA: Diagnosis present

## 2020-09-13 DIAGNOSIS — D869 Sarcoidosis, unspecified: Secondary | ICD-10-CM | POA: Diagnosis not present

## 2020-09-13 DIAGNOSIS — D860 Sarcoidosis of lung: Secondary | ICD-10-CM | POA: Diagnosis not present

## 2020-09-13 DIAGNOSIS — U071 COVID-19: Secondary | ICD-10-CM | POA: Diagnosis not present

## 2020-09-13 DIAGNOSIS — R69 Illness, unspecified: Secondary | ICD-10-CM | POA: Diagnosis not present

## 2020-09-13 DIAGNOSIS — Z7982 Long term (current) use of aspirin: Secondary | ICD-10-CM | POA: Diagnosis not present

## 2020-09-13 DIAGNOSIS — J45909 Unspecified asthma, uncomplicated: Secondary | ICD-10-CM | POA: Diagnosis not present

## 2020-09-13 DIAGNOSIS — Z79899 Other long term (current) drug therapy: Secondary | ICD-10-CM | POA: Diagnosis not present

## 2020-09-13 LAB — FIBRINOGEN: Fibrinogen: 468 mg/dL (ref 210–475)

## 2020-09-13 LAB — HEPATIC FUNCTION PANEL
ALT: 26 U/L (ref 0–44)
AST: 30 U/L (ref 15–41)
Albumin: 3.4 g/dL — ABNORMAL LOW (ref 3.5–5.0)
Alkaline Phosphatase: 60 U/L (ref 38–126)
Bilirubin, Direct: <0.1 mg/dL (ref 0.0–0.2)
Total Bilirubin: 0.4 mg/dL (ref 0.3–1.2)
Total Protein: 7 g/dL (ref 6.5–8.1)

## 2020-09-13 LAB — FIBRIN DERIVATIVES D-DIMER (ARMC ONLY): Fibrin derivatives D-dimer (ARMC): 468.32 ng/mL (FEU) (ref 0.00–499.00)

## 2020-09-13 LAB — LIPASE, BLOOD: Lipase: 35 U/L (ref 11–51)

## 2020-09-13 LAB — LACTATE DEHYDROGENASE: LDH: 262 U/L — ABNORMAL HIGH (ref 98–192)

## 2020-09-13 LAB — BRAIN NATRIURETIC PEPTIDE: B Natriuretic Peptide: 14.9 pg/mL (ref 0.0–100.0)

## 2020-09-13 LAB — HIV ANTIBODY (ROUTINE TESTING W REFLEX): HIV Screen 4th Generation wRfx: NONREACTIVE

## 2020-09-13 LAB — FERRITIN: Ferritin: 54 ng/mL (ref 11–307)

## 2020-09-13 LAB — TROPONIN I (HIGH SENSITIVITY): Troponin I (High Sensitivity): 5 ng/L (ref <18)

## 2020-09-13 MED ORDER — SODIUM CHLORIDE 0.9 % IV SOLN
100.0000 mg | Freq: Every day | INTRAVENOUS | Status: DC
  Administered 2020-09-14: 100 mg via INTRAVENOUS
  Filled 2020-09-13: qty 20

## 2020-09-13 MED ORDER — PANTOPRAZOLE SODIUM 40 MG IV SOLR
40.0000 mg | Freq: Once | INTRAVENOUS | Status: AC
  Administered 2020-09-13: 40 mg via INTRAVENOUS
  Filled 2020-09-13: qty 40

## 2020-09-13 MED ORDER — ZINC SULFATE 220 (50 ZN) MG PO CAPS
220.0000 mg | ORAL_CAPSULE | Freq: Every day | ORAL | Status: DC
  Administered 2020-09-13 – 2020-09-14 (×2): 220 mg via ORAL
  Filled 2020-09-13 (×2): qty 1

## 2020-09-13 MED ORDER — ASPIRIN EC 81 MG PO TBEC
81.0000 mg | DELAYED_RELEASE_TABLET | Freq: Every day | ORAL | Status: DC
  Administered 2020-09-13 – 2020-09-14 (×2): 81 mg via ORAL
  Filled 2020-09-13 (×2): qty 1

## 2020-09-13 MED ORDER — BENZONATATE 100 MG PO CAPS: 100.0000 mg | ORAL_CAPSULE | Freq: Two times a day (BID) | ORAL | Status: DC | PRN

## 2020-09-13 MED ORDER — SODIUM CHLORIDE 0.9 % IV SOLN: 100.0000 mg | Freq: Every day | INTRAVENOUS | Status: DC

## 2020-09-13 MED ORDER — SODIUM CHLORIDE 0.9 % IV SOLN
200.0000 mg | Freq: Once | INTRAVENOUS | Status: AC
  Administered 2020-09-13: 200 mg via INTRAVENOUS
  Filled 2020-09-13: qty 200

## 2020-09-13 MED ORDER — HYDROXYCHLOROQUINE SULFATE 200 MG PO TABS
200.0000 mg | ORAL_TABLET | Freq: Two times a day (BID) | ORAL | Status: DC
  Administered 2020-09-13 – 2020-09-14 (×3): 200 mg via ORAL
  Filled 2020-09-13 (×4): qty 1

## 2020-09-13 MED ORDER — ASCORBIC ACID 500 MG PO TABS
500.0000 mg | ORAL_TABLET | Freq: Every day | ORAL | Status: DC
  Administered 2020-09-13 – 2020-09-14 (×2): 500 mg via ORAL
  Filled 2020-09-13 (×2): qty 1

## 2020-09-13 MED ORDER — GUAIFENESIN-DM 100-10 MG/5ML PO SYRP: 10.0000 mL | ORAL_SOLUTION | ORAL | Status: DC | PRN

## 2020-09-13 MED ORDER — ASPIRIN-ACETAMINOPHEN-CAFFEINE 250-250-65 MG PO TABS
1.0000 | ORAL_TABLET | Freq: Four times a day (QID) | ORAL | Status: DC | PRN
  Filled 2020-09-13: qty 1

## 2020-09-13 MED ORDER — SODIUM CHLORIDE 0.9 % IV SOLN: 200.0000 mg | Freq: Once | INTRAVENOUS | Status: DC

## 2020-09-13 MED ORDER — DICYCLOMINE HCL 10 MG PO CAPS: 10.0000 mg | ORAL_CAPSULE | Freq: Three times a day (TID) | ORAL | Status: DC | PRN

## 2020-09-13 MED ORDER — METHYLPREDNISOLONE SODIUM SUCC 125 MG IJ SOLR
1.0000 mg/kg | Freq: Two times a day (BID) | INTRAMUSCULAR | Status: DC
  Administered 2020-09-13 – 2020-09-14 (×3): 72.5 mg via INTRAVENOUS
  Filled 2020-09-13 (×3): qty 2

## 2020-09-13 MED ORDER — SODIUM CHLORIDE 0.9 % IV SOLN: INTRAVENOUS | Status: DC

## 2020-09-13 MED ORDER — ZOLPIDEM TARTRATE 5 MG PO TABS: 5.0000 mg | ORAL_TABLET | Freq: Every evening | ORAL | Status: DC | PRN

## 2020-09-13 MED ORDER — FAMOTIDINE 20 MG PO TABS
20.0000 mg | ORAL_TABLET | Freq: Two times a day (BID) | ORAL | Status: DC
  Administered 2020-09-13 – 2020-09-14 (×3): 20 mg via ORAL
  Filled 2020-09-13 (×3): qty 1

## 2020-09-13 MED ORDER — ALBUTEROL SULFATE HFA 108 (90 BASE) MCG/ACT IN AERS
2.0000 | INHALATION_SPRAY | Freq: Four times a day (QID) | RESPIRATORY_TRACT | Status: DC | PRN
  Filled 2020-09-13: qty 6.7

## 2020-09-13 MED ORDER — PANTOPRAZOLE SODIUM 40 MG PO TBEC: 40.0000 mg | DELAYED_RELEASE_TABLET | Freq: Once | ORAL | Status: DC

## 2020-09-13 MED ORDER — TRIAMCINOLONE ACETONIDE 0.5 % EX OINT
TOPICAL_OINTMENT | Freq: Two times a day (BID) | CUTANEOUS | Status: DC
  Filled 2020-09-13: qty 15

## 2020-09-13 MED ORDER — MIRTAZAPINE 15 MG PO TABS
30.0000 mg | ORAL_TABLET | Freq: Every day | ORAL | Status: DC
  Administered 2020-09-13: 30 mg via ORAL
  Filled 2020-09-13: qty 2

## 2020-09-13 MED ORDER — DULOXETINE HCL 30 MG PO CPEP
30.0000 mg | ORAL_CAPSULE | Freq: Every day | ORAL | Status: DC
Start: 2020-09-13 — End: 2020-09-14
  Administered 2020-09-13 – 2020-09-14 (×2): 30 mg via ORAL
  Filled 2020-09-13 (×2): qty 1

## 2020-09-13 MED ORDER — PREDNISONE 20 MG PO TABS: 50.0000 mg | ORAL_TABLET | Freq: Every day | ORAL | Status: DC

## 2020-09-13 MED ORDER — ONDANSETRON HCL 4 MG PO TABS: 4.0000 mg | ORAL_TABLET | Freq: Four times a day (QID) | ORAL | Status: DC | PRN

## 2020-09-13 MED ORDER — METHYLPREDNISOLONE SODIUM SUCC 125 MG IJ SOLR
125.0000 mg | Freq: Once | INTRAMUSCULAR | Status: AC
  Administered 2020-09-13: 125 mg via INTRAVENOUS
  Filled 2020-09-13: qty 2

## 2020-09-13 MED ORDER — LIDOCAINE VISCOUS HCL 2 % MT SOLN
15.0000 mL | Freq: Once | OROMUCOSAL | Status: AC
  Administered 2020-09-13: 15 mL via ORAL
  Filled 2020-09-13: qty 15

## 2020-09-13 MED ORDER — TRAZODONE HCL 50 MG PO TABS: 25.0000 mg | ORAL_TABLET | Freq: Every evening | ORAL | Status: DC | PRN

## 2020-09-13 MED ORDER — PANTOPRAZOLE SODIUM 40 MG PO TBEC
40.0000 mg | DELAYED_RELEASE_TABLET | Freq: Every day | ORAL | Status: DC
  Administered 2020-09-13 – 2020-09-14 (×2): 40 mg via ORAL
  Filled 2020-09-13 (×2): qty 1

## 2020-09-13 MED ORDER — HYDROCOD POLST-CPM POLST ER 10-8 MG/5ML PO SUER: 5.0000 mL | Freq: Two times a day (BID) | ORAL | Status: DC | PRN

## 2020-09-13 MED ORDER — ALUM & MAG HYDROXIDE-SIMETH 200-200-20 MG/5ML PO SUSP
30.0000 mL | Freq: Once | ORAL | Status: AC
  Administered 2020-09-13: 30 mL via ORAL
  Filled 2020-09-13: qty 30

## 2020-09-13 MED ORDER — ONDANSETRON HCL 4 MG/2ML IJ SOLN: 4.0000 mg | Freq: Four times a day (QID) | INTRAMUSCULAR | Status: DC | PRN

## 2020-09-13 MED ORDER — BISOPROLOL-HYDROCHLOROTHIAZIDE 2.5-6.25 MG PO TABS
1.0000 | ORAL_TABLET | Freq: Every day | ORAL | Status: DC
  Administered 2020-09-13 – 2020-09-14 (×2): 1 via ORAL
  Filled 2020-09-13 (×2): qty 1

## 2020-09-13 MED ORDER — MAGNESIUM HYDROXIDE 400 MG/5ML PO SUSP: 30.0000 mL | Freq: Every day | ORAL | Status: DC | PRN

## 2020-09-13 MED ORDER — VITAMIN D 25 MCG (1000 UNIT) PO TABS
1000.0000 [IU] | ORAL_TABLET | Freq: Every day | ORAL | Status: DC
  Administered 2020-09-13 – 2020-09-14 (×2): 1000 [IU] via ORAL
  Filled 2020-09-13 (×2): qty 1

## 2020-09-13 MED ORDER — GUAIFENESIN ER 600 MG PO TB12
600.0000 mg | ORAL_TABLET | Freq: Two times a day (BID) | ORAL | Status: DC
  Administered 2020-09-13 – 2020-09-14 (×3): 600 mg via ORAL
  Filled 2020-09-13 (×3): qty 1

## 2020-09-13 MED ORDER — ENOXAPARIN SODIUM 40 MG/0.4ML ~~LOC~~ SOLN
40.0000 mg | SUBCUTANEOUS | Status: DC
  Administered 2020-09-13 – 2020-09-14 (×2): 40 mg via SUBCUTANEOUS
  Filled 2020-09-13 (×2): qty 0.4

## 2020-09-13 NOTE — ED Notes (Signed)
At dinner time, pt did not want meal that was sent, dining called and brought pt a second dinner. Pt states that meal was much better. Pt ate 80% of dinner.

## 2020-09-13 NOTE — H&P (Signed)
[facility]   PATIENT NAME: Brittany Huynh    MR#:  [id number]  DATE OF BIRTH:  03-23-1966  DATE OF ADMISSION:  09/13/2020  PRIMARY CARE PHYSICIAN: Lyndon Code, MD   REQUESTING/REFERRING PHYSICIAN: Ward, Layla Maw, DO  CHIEF COMPLAINT:   Chief Complaint  Patient presents with  . Oxygen Concern    HISTORY OF PRESENT ILLNESS:  Brittany Huynh  is a 55 y.o. female with a known history of sarcoidosis, asthma, and anxiety, as well as recently diagnosed COVID-19 on Tuesday, who presented to the emergency room with acute onset of worsening dyspnea on exertion with associated fever and dry cough that started on Wednesday with associated rhinorrhea and nasal congestion, diarrhea and fever that was up to 101 with chills, loss of taste and smell as well as generalized fatigue and tiredness.  She denied any nausea or vomiting.  She has not been vaccinated for COVID-19.  She has been having mild chest pain with deep breathing and cough as well as mild epigastric pain with cough.  The patient stated that her pulse oximetry dropped to 70% at home on room air for about 30 minutes which prompted her to come to the ER.  Upon presentation to the ER, temperature was 100.3 and heart rate was 115 with otherwise normal vital signs with pulse oximetry of 90% on room air.  Labs revealed potassium of 3.5 and otherwise CMP and CBC where unremarkable.  Chest x-ray showed patchy airspace opacities at both lung bases, left greater than right, consistent with atypical viral pneumonia.  The patient was given a gram of p.o. Tylenol, GI cocktail, 40 mg of IV Protonix, 125 mg of IV Solu-Medrol and IV remdesivir.  She will be admitted to a medical monitored bed for further evaluation and management.  PAST MEDICAL HISTORY:   Past Medical History:  Diagnosis Date  . Anxiety   . Asthma   . Breast fibrocystic disorder 2013  . Hernia 2011   chest wall  . Lung mass 2011  . Sarcoidosis   . Shortness of breath     2011  -Rheumatoid arthritis.  PAST SURGICAL HISTORY:   Past Surgical History:  Procedure Laterality Date  . ABDOMINAL HYSTERECTOMY  2009  . BREAST BIOPSY Left 02/15/2018   PREDOMINANTLY MATURE ADIPOSE TISSUE WITH VERY FOCAL AREAS OF BENIGN   . BREAST EXCISIONAL BIOPSY    . chamberlain procedure   2011  . COLONOSCOPY WITH PROPOFOL N/A 09/02/2020   Procedure: COLONOSCOPY WITH PROPOFOL;  Surgeon: Toney Reil, MD;  Location: [facility] ENDOSCOPY;  Service: Gastroenterology;  Laterality: N/A;  . ESOPHAGOGASTRODUODENOSCOPY (EGD) WITH PROPOFOL N/A 09/02/2020   Procedure: ESOPHAGOGASTRODUODENOSCOPY (EGD) WITH PROPOFOL;  Surgeon: Toney Reil, MD;  Location: [facility] ENDOSCOPY;  Service: Gastroenterology;  Laterality: N/A;  . IRRIGATION AND DEBRIDEMENT HEMATOMA Left 03/07/2018   Procedure: IRRIGATION AND DEBRIDEMENT HEMATOMA-LEFT BREAST;  Surgeon: Earline Mayotte, MD;  Location: [facility] ORS;  Service: General;  Laterality: Left;  . LUNG BIOPSY  2011  . RESECTION RIBS EXTRAPLEURAL  [id number]    SOCIAL HISTORY:   Social History   Tobacco Use  . Smoking status: Never Smoker  . Smokeless tobacco: Never Used  Substance Use Topics  . Alcohol use: Yes    Comment: on occassion    FAMILY HISTORY:   Family History  Problem Relation Age of Onset  . Liver cancer Mother 56  . Bone cancer Maternal Aunt 60  . Breast cancer Maternal Aunt   .  Breast cancer Paternal Aunt   . Kidney cancer Father   . Bone cancer Paternal Aunt 41    DRUG ALLERGIES:   Allergies  Allergen Reactions  . Onion Anaphylaxis  . Adhesive [Tape] Rash    REVIEW OF SYSTEMS:   ROS As per history of present illness. All pertinent systems were reviewed above. Constitutional, HEENT, cardiovascular, respiratory, GI, GU, musculoskeletal, neuro, psychiatric, endocrine, integumentary and hematologic systems were reviewed and are otherwise negative/unremarkable except for positive findings mentioned above in the  HPI.   MEDICATIONS AT HOME:   Prior to Admission medications   Medication Sig Start Date End Date Taking? Authorizing Provider  albuterol (PROVENTIL) (2.5 MG/3ML) 0.083% nebulizer solution Take 3 mLs (2.5 mg total) by nebulization every 6 (six) hours as needed for wheezing or shortness of breath. 10/09/19   [name], NP  albuterol (VENTOLIN HFA) 108 (90 Base) MCG/ACT inhaler Inhale 2 puffs into the lungs every 6 (six) hours as needed for wheezing or shortness of breath. 01/20/20   [name], NP  aspirin-acetaminophen-caffeine (EXCEDRIN MIGRAINE) [phone] MG per tablet Take 1 tablet by mouth every 6 (six) hours as needed for headache.    [provider]  augmented betamethasone dipropionate (DIPROLENE-AF) 0.05 % ointment APPLY TO AFFECTED AREA TWICE A DAY Patient taking differently: Apply 1 application topically 2 (two) times daily. 09/02/19   Lyndon Code, MD  azithromycin (ZITHROMAX Z-PAK) 250 MG tablet Take two 250 mg tablets on day one followed by one 250 mg tablet each day for four days. 09/10/20   Theotis Burrow, NP  benzonatate (TESSALON) 100 MG capsule Take 1 capsule (100 mg total) by mouth 2 (two) times daily as needed for cough. 09/10/20   Theotis Burrow, NP  bisoprolol-hydrochlorothiazide [facility]) 2.5-6.25 MG tablet Take one tab po qd for HTN 06/24/20   Lyndon Code, MD  celecoxib (CELEBREX) 200 MG capsule Take 1 capsule (200 mg total) by mouth daily. 04/20/20   [name], NP  dicyclomine (BENTYL) 10 MG capsule TAKE 1 CAPSULE BY MOUTH EVERY DAY 04/20/20   [name], NP  DULoxetine (CYMBALTA) 30 MG capsule Take 1 capsule (30 mg total) by mouth daily. 04/20/20   [name], NP  hydroxychloroquine (PLAQUENIL) 200 MG tablet Take 200 mg by mouth 2 (two) times daily. 02/25/20   [provider]  mirtazapine (REMERON) 30 MG tablet Take 1 tablet (30 mg total) by mouth at bedtime. 04/20/20   [name], NP  omeprazole (PRILOSEC) 40 MG capsule  Take 1 capsule (40 mg total) by mouth daily before breakfast. 09/02/20 10/02/20  Toney Reil, MD  predniSONE (DELTASONE) 1 MG tablet PT is tapering dose, currently taking 12mg  weekly and will step down each week by 1mg . 04/20/20   [place], , NP  predniSONE (DELTASONE) 10 MG tablet Take 1 tablet (10 mg total) by mouth daily with breakfast. 04/20/20   Coralee North, NP  zolpidem (AMBIEN) 10 MG tablet Take 1 tablet (10 mg total) by mouth at bedtime as needed for sleep. 04/20/20 06/19/20  04/22/20, NP      VITAL SIGNS:  Blood pressure (!) 132/91, pulse 93, temperature 100.3 F (37.9 C), temperature source Oral, resp. rate 19, SpO2 97 %.  PHYSICAL EXAMINATION:  Physical Exam  GENERAL:  55 y.o.-year-old female patient lying in the bed with respiratory distress with conversational dyspnea. EYES: Pupils equal, round, reactive to light and accommodation. No scleral icterus. Extraocular muscles  intact.  HEENT: Head atraumatic, normocephalic. Oropharynx and nasopharynx clear.  NECK:  Supple, no jugular venous distention. No thyroid enlargement, no tenderness.  LUNGS: Diminished bibasal breath sounds with bibasal crackles. CARDIOVASCULAR: Regular rate and rhythm, S1, S2 normal. No murmurs, rubs, or gallops.  ABDOMEN: Soft, nondistended, nontender. Bowel sounds present. No organomegaly or mass.  EXTREMITIES: No pedal edema, cyanosis, or clubbing.  NEUROLOGIC: Cranial nerves II through XII are intact. Muscle strength 5/5 in all extremities. Sensation intact. Gait not checked.  PSYCHIATRIC: The patient is alert and oriented x 3.  Normal affect and good eye contact. SKIN: No obvious rash, lesion, or ulcer.   LABORATORY PANEL:   CBC Recent Labs  Lab 09/12/20 2300  WBC 4.8  HGB 14.2  HCT 43.8  PLT 149*   ------------------------------------------------------------------------------------------------------------------  Chemistries  Recent Labs  Lab 09/12/20 2300  NA 137  K  3.5  CL 105  CO2 21*  GLUCOSE 89  BUN 10  CREATININE 0.82  CALCIUM 8.6*  AST 30  ALT 26  ALKPHOS 60  BILITOT 0.4   ------------------------------------------------------------------------------------------------------------------  Cardiac Enzymes No results for input(s): TROPONINI in the last 168 hours. ------------------------------------------------------------------------------------------------------------------  RADIOLOGY:  DG Chest 2 View  Result Date: 09/12/2020 CLINICAL DATA:  Shortness of breath, COVID positive EXAM: CHEST - 2 VIEW COMPARISON:  October 07, 2019 FINDINGS: The heart size and mediastinal contours are within normal limits. Patchy airspace opacity is seen at the periphery of the left lung base and a small focus of patchy airspace opacity at the right lung base. A small left pleural effusion is seen. No acute osseous abnormality. IMPRESSION: Patchy airspace opacities at both lung bases, left greater than right, likely consistent with atypical viral pneumonia. Small left pleural effusion Electronically Signed   By: Jonna Clark M.D.   On: 09/12/2020 23:25      IMPRESSION AND PLAN:   1.  Multifocal pneumonia secondary to COVID-19. -The patient will be admitted to an isolation monitored bed with droplet and contact precautions. -Given multifocal pneumonia we will empirically place the patient on IV Rocephin and Zithromax for possible bacterial superinfection only with elevated Procalcitonin. -The patient will be placed on scheduled Mucinex and as needed Tussionex. -We will avoid nebulization as much as we can, give bronchodilator MDI if needed, and with deterioration of oxygenation try to avoid BiPAP/CPAP if possible.    -Will obtain sputum Gram stain culture and sensitivity and follow blood cultures. -O2 protocol will be followed. -We will follow CRP, ferritin, LDH and D-dimer. -Will follow manual differential for ANC/ALC ratio as well as follow troponin I and  daily CBC with manual differential and CMP. - Will place the patient on IV Remdesivir and IV steroid therapy with IV Solu-Medrol with elevated inflammatory markers. -The patient will be placed on vitamin D3, vitamin C, zinc sulfate, p.o. Pepcid and aspirin.  2.  Sarcoidosis with mild exacerbation secondary to #1 - We will continue steroid therapy with IV Solu-Medrol. - The patient will be placed on bronchodilator therapy by MDI.  3.  GERD. - We will continue PPI therapy.  4.  Rheumatoid arthritis. - We will continue Plaquenil.  5.  Depression. - We will continue Cymbalta.  6.  DVT prophylaxis. -Subcutaneous Lovenox.   All the records are reviewed and case discussed with ED provider. The plan of care was discussed in details with the patient (and family). I answered all questions. The patient agreed to proceed with the above mentioned plan. Further management will depend upon  hospital course.   CODE STATUS: Full code  Status is: Inpatient  Remains inpatient appropriate because:Ongoing diagnostic testing needed not appropriate for outpatient work up, Unsafe d/c plan, IV treatments appropriate due to intensity of illness or inability to take PO and Inpatient level of care appropriate due to severity of illness   Dispo: The patient is from: Home              Anticipated d/c is to: Home              Anticipated d/c date is: 3 days              Patient currently is not medically stable to d/c.   TOTAL TIME TAKING CARE OF THIS PATIENT: 55 minutes.    Hannah Beat M.D on 09/13/2020 at 4:07 AM  [facility]   From 7 PM-7 AM, contact night-coverage [email or web address]  CC: Primary care physician; Lyndon Code, MD

## 2020-09-13 NOTE — Progress Notes (Signed)
Remdesivir - Pharmacy Brief Note   A/P:  Remdesivir 200 mg IVPB once followed by 100 mg IVPB daily x 4 days.   Scott Hall, PharmD Clinical Pharmacist  

## 2020-09-13 NOTE — ED Provider Notes (Signed)
[facility] Emergency Department Provider Note   ____________________________________________   Event Date/Time   First MD Initiated Contact with Patient 09/13/20 [phone]     (approximate)  I have reviewed the triage vital signs and the nursing notes.   HISTORY  Chief Complaint Oxygen Concern    HPI Brittany Huynh is a 55 y.o. female with history of sarcoidosis on prednisone, asthma who presents to the emergency department with shortness of breath.  Diagnosed with COVID-19 on Tuesday, January 11.  Has had fevers, cough, congestion.  Feeling short of breath currently.  States at home at rest her oxygen saturation was in the 70s for approximately 30 minutes and that is what prompted her to come to the ED.  She does not wear oxygen chronically.     Past Medical History:  Diagnosis Date  . Anxiety   . Asthma   . Breast fibrocystic disorder 2013  . Hernia 2011   chest wall  . Lung mass 2011  . Sarcoidosis   . Shortness of breath    2011    Patient Active Problem List   Diagnosis Date Noted  . Arthralgia 08/20/2019  . Osteopenia of lumbar spine 08/20/2019  . Asthma 12/06/2018  . Other atopic dermatitis 04/07/2018  . Breast pain, left 02/28/2018  . Numbness and tingling 02/12/2018  . Essential hypertension 01/14/2018  . Other chest pain 01/14/2018  . Fatigue 01/14/2018  . Polyneuropathic pain 01/14/2018  . Acute vaginitis 01/14/2018  . Unspecified menopausal and perimenopausal disorder 01/14/2018  . Vitamin D deficiency 01/14/2018  . Dysuria 01/14/2018  . History of chickenpox 11/27/2017  . Shingles 11/27/2017  . 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 Shortness of breath 09/15/2017  . Edema 09/15/2017  . Recurrent major depressive disorder (HCC) 09/15/2017  . Migraine 09/15/2017  . Inflammatory polyarthritis (HCC) 09/15/2017  . Myopathy 09/15/2017  . Long term current use of systemic steroids 09/15/2017  . Tachycardia, unspecified  09/15/2017  . Gastro-esophageal reflux disease without esophagitis 09/15/2017  . Bacterial intestinal infection 09/15/2017  . Hypersomnia, unspecified 09/15/2017  . Iron deficiency anemia 09/15/2017  . Neuralgia and neuritis 09/15/2017  . Restless leg syndrome 09/15/2017  . Generalized anxiety disorder 09/15/2017  . Fibrocystic breast disease 04/05/2013  . Screening for breast cancer 04/05/2013  . Posttraumatic hematoma of left breast 04/05/2013    Past Surgical History:  Procedure Laterality Date  . ABDOMINAL HYSTERECTOMY  2009  . BREAST BIOPSY Left 02/15/2018   PREDOMINANTLY MATURE ADIPOSE TISSUE WITH VERY FOCAL AREAS OF BENIGN   . BREAST EXCISIONAL BIOPSY    . chamberlain procedure   2011  . COLONOSCOPY WITH PROPOFOL N/A 09/02/2020   Procedure: COLONOSCOPY WITH PROPOFOL;  Surgeon: Toney Reil, MD;  Location: [facility] ENDOSCOPY;  Service: Gastroenterology;  Laterality: N/A;  . ESOPHAGOGASTRODUODENOSCOPY (EGD) WITH PROPOFOL N/A 09/02/2020   Procedure: ESOPHAGOGASTRODUODENOSCOPY (EGD) WITH PROPOFOL;  Surgeon: Toney Reil, MD;  Location: [facility] ENDOSCOPY;  Service: Gastroenterology;  Laterality: N/A;  . IRRIGATION AND DEBRIDEMENT HEMATOMA Left 03/07/2018   Procedure: IRRIGATION AND DEBRIDEMENT HEMATOMA-LEFT BREAST;  Surgeon: Earline Mayotte, MD;  Location: [facility] ORS;  Service: General;  Laterality: Left;  . LUNG BIOPSY  2011  . RESECTION RIBS EXTRAPLEURAL  10626    Prior to Admission medications   Medication Sig Start Date End Date Taking? Authorizing Provider  albuterol (PROVENTIL) (2.5 MG/3ML) 0.083% nebulizer solution Take 3 mLs (2.5 mg total) by nebulization every 6 (six) hours as needed for wheezing or shortness  of breath. 10/09/19   [name], NP  albuterol (VENTOLIN HFA) 108 (90 Base) MCG/ACT inhaler Inhale 2 puffs into the lungs every 6 (six) hours as needed for wheezing or shortness of breath. 01/20/20   [name], NP  aspirin-acetaminophen-caffeine  (EXCEDRIN MIGRAINE) [phone] MG per tablet Take 1 tablet by mouth every 6 (six) hours as needed for headache.    [provider]  augmented betamethasone dipropionate (DIPROLENE-AF) 0.05 % ointment APPLY TO AFFECTED AREA TWICE A DAY Patient taking differently: Apply 1 application topically 2 (two) times daily. 09/02/19   Lyndon Code, MD  azithromycin (ZITHROMAX Z-PAK) 250 MG tablet Take two 250 mg tablets on day one followed by one 250 mg tablet each day for four days. 09/10/20   Theotis Burrow, NP  benzonatate (TESSALON) 100 MG capsule Take 1 capsule (100 mg total) by mouth 2 (two) times daily as needed for cough. 09/10/20   Theotis Burrow, NP  bisoprolol-hydrochlorothiazide [facility]) 2.5-6.25 MG tablet Take one tab po qd for HTN 06/24/20   Lyndon Code, MD  celecoxib (CELEBREX) 200 MG capsule Take 1 capsule (200 mg total) by mouth daily. 04/20/20   [name], NP  dicyclomine (BENTYL) 10 MG capsule TAKE 1 CAPSULE BY MOUTH EVERY DAY 04/20/20   [name], NP  DULoxetine (CYMBALTA) 30 MG capsule Take 1 capsule (30 mg total) by mouth daily. 04/20/20   [name], NP  hydroxychloroquine (PLAQUENIL) 200 MG tablet Take 200 mg by mouth 2 (two) times daily. 02/25/20   [provider]  mirtazapine (REMERON) 30 MG tablet Take 1 tablet (30 mg total) by mouth at bedtime. 04/20/20   [name], NP  omeprazole (PRILOSEC) 40 MG capsule Take 1 capsule (40 mg total) by mouth daily before breakfast. 09/02/20 10/02/20  Toney Reil, MD  predniSONE (DELTASONE) 1 MG tablet PT is tapering dose, currently taking 12mg  weekly and will step down each week by 1mg . 04/20/20   Scarboro, Coralee North, NP  predniSONE (DELTASONE) 10 MG tablet Take 1 tablet (10 mg total) by mouth daily with breakfast. 04/20/20   [name], NP  zolpidem (AMBIEN) 10 MG tablet Take 1 tablet (10 mg total) by mouth at bedtime as needed for sleep. 04/20/20 06/19/20  [name], NP    Allergies Onion and  Adhesive [tape]  Family History  Problem Relation Age of Onset  . Liver cancer Mother 61  . Bone cancer Maternal Aunt 60  . Breast cancer Maternal Aunt   . Breast cancer Paternal Aunt   . Kidney cancer Father   . Bone cancer Paternal Aunt 66    Social History Social History   Tobacco Use  . Smoking status: Never Smoker  . Smokeless tobacco: Never Used  Vaping Use  . Vaping Use: Never used  Substance Use Topics  . Alcohol use: Yes    Comment: on occassion  . Drug use: No    Review of Systems  Constitutional: + fever/chills Eyes: No visual changes. ENT: No sore throat. Cardiovascular: Denies chest pain. Respiratory: + shortness of breath. Gastrointestinal: No abdominal pain.  No nausea, no vomiting.  No diarrhea.  No constipation. Genitourinary: Negative for dysuria. Musculoskeletal: Negative for back pain. Skin: Negative for rash. Neurological: Negative for headaches, focal weakness or numbness.   ____________________________________________   PHYSICAL EXAM:  VITAL SIGNS: ED Triage Vitals [09/12/20 2254]  Enc Vitals Group     BP 133/89  Pulse Rate (!) 115     Resp 18     Temp 100.3 F (37.9 C)     Temp Source Oral     SpO2 99 %     Weight      Height      Head Circumference      Peak Flow      Pain Score      Pain Loc      Pain Edu?      Excl. in GC?     Constitutional: Alert and oriented. Well appearing and in no acute distress. Eyes: Conjunctivae are normal. PERRL. EOMI. Head: Atraumatic. Nose: No congestion/rhinnorhea. Mouth/Throat: Mucous membranes are moist.  Oropharynx non-erythematous. Neck: No stridor.   Cardiovascular: Normal rate, regular rhythm. Grossly normal heart sounds.  Good peripheral circulation. Respiratory: Normal respiratory effort.  No retractions. Lungs CTAB. Gastrointestinal: Soft and nontender. No distention. No abdominal bruits. No CVA tenderness. Musculoskeletal: No lower extremity tenderness nor edema.  No joint  effusions. Neurologic:  Normal speech and language. No gross focal neurologic deficits are appreciated. No gait instability. Skin:  Skin is warm, dry and intact. No rash noted. Psychiatric: Mood and affect are normal. Speech and behavior are normal.  ____________________________________________   LABS (all labs ordered are listed, but only abnormal results are displayed)  Labs Reviewed  CBC - Abnormal; Notable for the following components:      Result Value   RBC 5.30 (*)    Platelets 149 (*)    All other components within normal limits  BASIC METABOLIC PANEL - Abnormal; Notable for the following components:   CO2 21 (*)    Calcium 8.6 (*)    All other components within normal limits   ____________________________________________  EKG  None ____________________________________________  RADIOLOGY  ED MD interpretation: Bilateral viral pneumonia.  Official radiology report(s): DG Chest 2 View  Result Date: 09/12/2020 CLINICAL DATA:  Shortness of breath, COVID positive EXAM: CHEST - 2 VIEW COMPARISON:  October 07, 2019 FINDINGS: The heart size and mediastinal contours are within normal limits. Patchy airspace opacity is seen at the periphery of the left lung base and a small focus of patchy airspace opacity at the right lung base. A small left pleural effusion is seen. No acute osseous abnormality. IMPRESSION: Patchy airspace opacities at both lung bases, left greater than right, likely consistent with atypical viral pneumonia. Small left pleural effusion Electronically Signed   By: Jonna Clark M.D.   On: 09/12/2020 23:25    ____________________________________________   PROCEDURES  Procedure(s) performed: None  Procedures  Critical Care performed: No  ____________________________________________   INITIAL IMPRESSION / ASSESSMENT AND PLAN / ED COURSE  As part of my medical decision making, I reviewed the following data within the electronic MEDICAL RECORD NUMBER Nursing  notes reviewed and incorporated, Labs reviewed and show no acute abnormality, Radiograph reviewed and shows bilateral atypical viral pneumonia and Notes from prior ED visits     Patient here with COVID-pneumonia.  Reports oxygen saturations of 74% at home for approximately 30 minutes.  Satting 99% on room air at rest here without respiratory distress or increased work of breathing.  Will obtain ambulatory sat.  Labs reassuring.    Patient able to ambulate here without hypoxia, respiratory distress or significant increased work of breathing.  Based on only 1 low pulse ox at home, I do not feel patient needs to be admitted at this time but did discuss that things could change/worsen and she may need  to come back to the hospital if symptoms return.  She is outside treatment window for IV remdesivir or MAB as an outpatient.  3:05 AM When I had discussion with patient she seemed comfortable with plan for discharge home but now her daughter is in the room and has the patient's sister on speaker phone.  They do not feel comfortable with patient going home given oxygen saturation in the 70s at home, sarcoidosis with immunosuppression and given bad weather that is supposed to start this morning.  We will start Solu-Medrol and remdesivir and discuss with hospitalist.  They are requesting admission.  She also now tells me she is having upper abdominal pain that is consistent with pain from her gastric ulcers.  Hemoglobin is 14.  Will give GI cocktail, Protonix.  Abdominal exam benign.  Will add on LFTs and lipase.   3:57 AM Discussed patient's case with hospitalist, Dr. Arville Care.  I have recommended admission and patient (and family if present) agree with this plan. Admitting physician will place admission orders.   I reviewed all nursing notes, vitals, pertinent previous records and reviewed/interpreted all EKGs, lab and urine results, imaging (as  available).   ____________________________________________   FINAL CLINICAL IMPRESSION(S) / ED DIAGNOSES  Final diagnoses:  Pneumonia due to COVID-19 virus     ED Discharge Orders    None       Note:  This document was prepared using Dragon voice recognition software and may include unintentional dictation errors.    Redell Bhandari, Layla Maw, DO 09/13/20 [phone]

## 2020-09-13 NOTE — ED Notes (Signed)
Patient ambulated by this RN while on pulse ox. Patient's O2 remained 99% while ambulating and HR remained in high 90's. Patient's sats decreased to 96% upon changing position from lying to sitting, and increased to 100% after sitting on edge of bed for 5 minutes.

## 2020-09-13 NOTE — ED Notes (Signed)
Report to [place], rn.

## 2020-09-13 NOTE — Progress Notes (Signed)
No charge progress note.  Brittany Huynh  is a 55 y.o. female with a known history of sarcoidosis, asthma, and anxiety, as well as recently diagnosed COVID-19 on Tuesday, who presented to the emergency room with acute onset of worsening dyspnea on exertion with associated fever and dry cough that started on Wednesday with associated rhinorrhea and nasal congestion, diarrhea and fever that was up to 101 with chills, loss of taste and smell as well as generalized fatigue and tiredness.  She denied any nausea or vomiting.  She has not been vaccinated for COVID-19.   D-dimer within normal limit, chest x-ray with patchy airspace opacities at both lung bases, left greater than right, more consistent with atypical/viral pneumonia.  She was saturating well on room air.  She was started on remdesivir and steroid.  Continue current management and if remained on room air tomorrow can be discharged tomorrow to complete remdesivir as an outpatient.

## 2020-09-14 DIAGNOSIS — U071 COVID-19: Principal | ICD-10-CM

## 2020-09-14 DIAGNOSIS — J1282 Pneumonia due to coronavirus disease 2019: Secondary | ICD-10-CM

## 2020-09-14 LAB — C-REACTIVE PROTEIN
CRP: 0.7 mg/dL (ref <1.0)
CRP: 0.5 mg/dL (ref <1.0)

## 2020-09-14 LAB — COMPREHENSIVE METABOLIC PANEL
ALT: 20 U/L (ref 0–44)
AST: 25 U/L (ref 15–41)
Albumin: 3 g/dL — ABNORMAL LOW (ref 3.5–5.0)
Alkaline Phosphatase: 54 U/L (ref 38–126)
Anion gap: 8 (ref 5–15)
BUN: 14 mg/dL (ref 6–20)
CO2: 23 mmol/L (ref 22–32)
Calcium: 8.1 mg/dL — ABNORMAL LOW (ref 8.9–10.3)
Chloride: 110 mmol/L (ref 98–111)
Creatinine, Ser: 0.68 mg/dL (ref 0.44–1.00)
GFR, Estimated: >60 mL/min (ref >60)
Glucose, Bld: 127 mg/dL — ABNORMAL HIGH (ref 70–99)
Potassium: 3.6 mmol/L (ref 3.5–5.1)
Sodium: 141 mmol/L (ref 135–145)
Total Bilirubin: 0.4 mg/dL (ref 0.3–1.2)
Total Protein: 6.4 g/dL — ABNORMAL LOW (ref 6.5–8.1)

## 2020-09-14 LAB — CBC WITH DIFFERENTIAL/PLATELET
Abs Immature Granulocytes: 0.01 10*3/uL (ref 0.00–0.07)
Basophils Absolute: 0 10*3/uL (ref 0.0–0.1)
Basophils Relative: 0 %
Eosinophils Absolute: 0 10*3/uL (ref 0.0–0.5)
Eosinophils Relative: 0 %
HCT: 39 % (ref 36.0–46.0)
Hemoglobin: 12.8 g/dL (ref 12.0–15.0)
Immature Granulocytes: 0 %
Lymphocytes Relative: 25 %
Lymphs Abs: 0.8 10*3/uL (ref 0.7–4.0)
MCH: 27.1 pg (ref 26.0–34.0)
MCHC: 32.8 g/dL (ref 30.0–36.0)
MCV: 82.6 fL (ref 80.0–100.0)
Monocytes Absolute: 0.2 10*3/uL (ref 0.1–1.0)
Monocytes Relative: 6 %
Neutro Abs: 2.3 10*3/uL (ref 1.7–7.7)
Neutrophils Relative %: 69 %
Platelets: 138 10*3/uL — ABNORMAL LOW (ref 150–400)
RBC: 4.72 MIL/uL (ref 3.87–5.11)
RDW: 13.8 % (ref 11.5–15.5)
WBC: 3.4 10*3/uL — ABNORMAL LOW (ref 4.0–10.5)
nRBC: 0 % (ref 0.0–0.2)

## 2020-09-14 LAB — PROCALCITONIN: Procalcitonin: <0.1 ng/mL

## 2020-09-14 LAB — FERRITIN: Ferritin: 60 ng/mL (ref 11–307)

## 2020-09-14 LAB — FIBRIN DERIVATIVES D-DIMER (ARMC ONLY): Fibrin derivatives D-dimer (ARMC): 389.14 ng/mL (FEU) (ref 0.00–499.00)

## 2020-09-14 MED ORDER — BETAMETHASONE DIPROPIONATE AUG 0.05 % EX OINT: 1.0000 "application " | TOPICAL_OINTMENT | Freq: Two times a day (BID) | CUTANEOUS | 0 refills | Status: DC

## 2020-09-14 MED ORDER — ASPIRIN 81 MG PO TBEC: 81.0000 mg | DELAYED_RELEASE_TABLET | Freq: Every day | ORAL | 11 refills | Status: DC

## 2020-09-14 MED ORDER — VITAMIN D3 25 MCG PO TABS: 1000.0000 [IU] | ORAL_TABLET | Freq: Every day | ORAL | 0 refills | Status: DC

## 2020-09-14 MED ORDER — PREDNISONE 50 MG PO TABS: 50.0000 mg | ORAL_TABLET | Freq: Every day | ORAL | 0 refills | Status: AC

## 2020-09-14 MED ORDER — ZINC SULFATE 220 (50 ZN) MG PO CAPS: 220.0000 mg | ORAL_CAPSULE | Freq: Every day | ORAL | 0 refills | Status: DC

## 2020-09-14 MED ORDER — ASCORBIC ACID 500 MG PO TABS: 500.0000 mg | ORAL_TABLET | Freq: Every day | ORAL | 0 refills | Status: DC

## 2020-09-14 NOTE — ED Notes (Signed)
No signature pad, unable to obtain signature. Pt verbally acknowledges d/c instructions

## 2020-09-14 NOTE — Discharge Instructions (Signed)
You are scheduled for a Remdesivir infusion on 1/18 at 12:30 pm. Please come to [address] [facility], you will see a COVID infusion banner by the road.  Enter there and turn left.  There are marked spaces for Infusion. Call the number on the sign or [phone] and someone will come out and bring you inside. If someone is driving you please come to the same area and call the number and someone will come outside to get you. Thank you!      You were seen in the emergency department with shortness of breath.  Your chest x-ray did show pneumonia secondary to COVID-19.  You do not need antibiotics.  Your vital signs here are reassuring with normal oxygen saturations at rest and with ambulation.  I do not feel at this time that you need admission to the hospital recommend you continue to watch her oxygen levels closely at home.  If you have persistent oxygen levels less than 90, please return to the emergency department.  If you have chest pain, blue lips or blue fingertips, vomiting and cannot stop, confusion, please return to the emergency department.

## 2020-09-14 NOTE — ED Notes (Signed)
Lab called to get morning bloodwork, stated they will come and collect when available.

## 2020-09-14 NOTE — Progress Notes (Addendum)
The patient is scheduled for a Remdesivir infusion on 09/15/20 at 12:30 pm. Have the patient come to [address] [facility], they will see a COVID infusion banner by the road.  Enter there and turn left. There are marked spaces for Infusion.  Call the number on the sign or [phone] and someone will come out and bring them inside.  If someone is driving them, have them come to the same area and call the number and someone will come outside to get them. Thank you!

## 2020-09-14 NOTE — ED Notes (Signed)
This RN attempted to collect morning labs with no success.

## 2020-09-14 NOTE — Discharge Summary (Signed)
Physician Discharge Summary  Brittany Huynh ZLD:[id number] DOB: 03-20-1966 DOA: 09/13/2020  PCP: Brittany Code, MD  Admit date: 09/13/2020 Discharge date: 09/14/2020  Admitted From: Home Disposition:  Home  Recommendations for Outpatient Follow-up:  1. Follow up with PCP in 1-2 weeks 2. Please obtain BMP/CBC in one week 3. Please follow up on the following pending results:None  Home Health:No Equipment/Devices: None Discharge Condition: Stable Huynh STATUS: Full Diet recommendation: Heart Healthy / Carb Modified  Brief/Interim Summary: [name], asthma, and anxiety, [name], who presented to the emergency room with acute onset of worsening dyspnea on exertion with associated fever anddrycoughthat started on Wednesday with associated rhinorrhea and nasal congestion, diarrhea and fever that was up to 101 with chills, loss of taste and smell as well as generalized fatigue and tiredness. She denied any nausea or vomiting. She has not been vaccinated for COVID-19. chest x-ray with patchy airspace opacities at both lung bases, left greater than right, more consistent with atypical/viral pneumonia.  She was saturating well on room air.  Inflammatory markers within normal limit.  She was started on remdesivir and steroid for her risk factors.  She received 2 doses of remdesivir and will get her third dose as an outpatient tomorrow at infusion center. She will continue with prednisone 50 mg for 1 more week followed by resuming her normal dose.  She will continue the rest of her home meds and follow-up with her primary care provider.  Discharge Diagnoses:  Active Problems:   Pneumonia due to COVID-19 virus   Discharge Instructions  Discharge Instructions    Diet - low sodium heart healthy   Complete by: As directed    Discharge instructions   Complete by: As directed    It was  pleasure taking care of you. Please go to infusion center as directed for 1 more dose of remdesivir tomorrow. Start taking your home dose of prednisone after finishing high dose of 50 mg daily for 1 week. Continue taking rest of your medications and supplements. Keep yourself well-hydrated. Quarantine yourself for 1 week. Follow-up with your primary care provider.   Increase activity slowly   Complete by: As directed      Allergies as of 09/14/2020      Reactions   Onion Anaphylaxis   Adhesive [tape] Rash      Medication List    STOP taking these medications   azithromycin 250 MG tablet Commonly known as: Zithromax Z-Pak     TAKE these medications   albuterol (2.5 MG/3ML) 0.083% nebulizer solution Commonly known as: PROVENTIL Take 3 mLs (2.5 mg total) by nebulization every 6 (six) hours as needed for wheezing or shortness of breath.   albuterol 108 (90 Base) MCG/ACT inhaler Commonly known as: VENTOLIN HFA Inhale 2 puffs into the lungs every 6 (six) hours as needed for wheezing or shortness of breath.   ascorbic acid 500 MG tablet Commonly known as: VITAMIN C Take 1 tablet (500 mg total) by mouth daily.   aspirin 81 MG EC tablet Take 1 tablet (81 mg total) by mouth daily. Swallow whole.   aspirin-acetaminophen-caffeine 250-250-65 MG tablet Commonly known as: EXCEDRIN MIGRAINE Take 1 tablet by mouth every 6 (six) hours as needed for headache.   augmented betamethasone dipropionate 0.05 % ointment Commonly known as: DIPROLENE-AF Apply 1 application topically 2 (two) times daily.   benzonatate 100 MG capsule Commonly known as: TESSALON Take 1 capsule (100  mg total) by mouth 2 (two) times daily as needed for cough.   bisoprolol-hydrochlorothiazide 2.5-6.25 MG tablet Commonly known as: ZIAC Take one tab po qd for HTN   celecoxib 200 MG capsule Commonly known as: CELEBREX Take 1 capsule (200 mg total) by mouth daily.   dicyclomine 10 MG capsule Commonly known as:  BENTYL TAKE 1 CAPSULE BY MOUTH EVERY DAY   DULoxetine 30 MG capsule Commonly known as: CYMBALTA Take 1 capsule (30 mg total) by mouth daily.   hydroxychloroquine 200 MG tablet Commonly known as: PLAQUENIL Take 200 mg by mouth 2 (two) times daily.   mirtazapine 30 MG tablet Commonly known as: REMERON Take 1 tablet (30 mg total) by mouth at bedtime.   omeprazole 40 MG capsule Commonly known as: PRILOSEC Take 1 capsule (40 mg total) by mouth daily before breakfast.   predniSONE 10 MG tablet Commonly known as: DELTASONE Take 1 tablet (10 mg total) by mouth daily with breakfast. What changed: Another medication with the same name was changed. Make sure you understand how and when to take each.   predniSONE 50 MG tablet Commonly known as: DELTASONE Take 1 tablet (50 mg total) by mouth daily for 7 days. Start taking on: September 16, 2020 What changed:   medication strength  how much to take  how to take this  when to take this  additional instructions  These instructions start on September 16, 2020. If you are unsure what to do until then, ask your doctor or other care provider.   Vitamin D3 25 MCG tablet Commonly known as: Vitamin D Take 1 tablet (1,000 Units total) by mouth daily.   zinc sulfate 220 (50 Zn) MG capsule Take 1 capsule (220 mg total) by mouth daily.   zolpidem 10 MG tablet Commonly known as: AMBIEN Take 1 tablet (10 mg total) by mouth at bedtime as needed for sleep.       Follow-up Information    Brittany Code, MD.   Specialty: Internal Medicine Why: As needed Contact information: [address] [place] [address] [phone]              Allergies  Allergen Reactions  . Onion Anaphylaxis  . Adhesive [Tape] Rash    Consultations:  None  Procedures/Studies: DG Chest 2 View  Result Date: 09/12/2020 CLINICAL DATA:  Shortness of breath, COVID positive EXAM: CHEST - 2 VIEW COMPARISON:  October 07, 2019 FINDINGS: The heart size  and mediastinal contours are within normal limits. Patchy airspace opacity is seen at the periphery of the left lung base and a small focus of patchy airspace opacity at the right lung base. A small left pleural effusion is seen. No acute osseous abnormality. IMPRESSION: Patchy airspace opacities at both lung bases, left greater than right, likely consistent with atypical viral pneumonia. Small left pleural effusion Electronically Signed   By: Jonna Clark M.D.   On: 09/12/2020 23:25     Subjective: Patient was feeling better when seen today.  Ready to go home.  She agrees to get 1 more dose of remdesivir as an outpatient.  Appointment was arranged.  Discharge Exam: Vitals:   09/14/20 0250 09/14/20 0600  BP: 134/88 116/79  Pulse: 75 87  Resp: 20 18  Temp:    SpO2: 94% 95%   Vitals:   09/13/20 1843 09/13/20 2157 09/14/20 0250 09/14/20 0600  BP: 135/83 (!) 148/93 134/88 116/79  Pulse: 88 83 75 87  Resp: Temp:  TempSrc:      SpO2: 97% 95% 94% 95%    General: Pt is alert, awake, not in acute distress Cardiovascular: RRR, S1/S2 +, no rubs, no gallops Respiratory: CTA bilaterally, no wheezing, no rhonchi Abdominal: Soft, NT, ND, bowel sounds + Extremities: no edema, no cyanosis   The results of significant diagnostics from this hospitalization (including imaging, microbiology, ancillary and laboratory) are listed below for reference.    Microbiology: No results found for this or any previous visit (from the past 240 hour(s)).   Labs: BNP (last 3 results) Recent Labs    09/13/20 0434  BNP 14.9   Basic Metabolic Panel: Recent Labs  Lab 09/12/20 2300 09/14/20 0703  NA 137 141  K 3.5 3.6  CL 105 110  CO2 21* 23  GLUCOSE 89 127*  BUN 10 14  CREATININE 0.82 0.68  CALCIUM 8.6* 8.1*   Liver Function Tests: Recent Labs  Lab 09/12/20 2300 09/14/20 0703  AST 30 25  ALT 26 20  ALKPHOS 60 54  BILITOT 0.4 0.4  PROT 7.0 6.4*  ALBUMIN 3.4* 3.0*    Recent Labs  Lab 09/12/20 2300  LIPASE 35   No results for input(s): AMMONIA in the last 168 hours. CBC: Recent Labs  Lab 09/12/20 2300 09/14/20 0703  WBC 4.8 3.4*  NEUTROABS  --  2.3  HGB 14.2 12.8  HCT 43.8 39.0  MCV 82.6 82.6  PLT 149* 138*   Cardiac Enzymes: No results for input(s): CKTOTAL, CKMB, CKMBINDEX, TROPONINI in the last 168 hours. BNP: Invalid input(s): POCBNP CBG: No results for input(s): GLUCAP in the last 168 hours. D-Dimer No results for input(s): DDIMER in the last 72 hours. Hgb A1c No results for input(s): HGBA1C in the last 72 hours. Lipid Profile No results for input(s): CHOL, HDL, LDLCALC, TRIG, CHOLHDL, LDLDIRECT in the last 72 hours. Thyroid function studies No results for input(s): TSH, T4TOTAL, T3FREE, THYROIDAB in the last 72 hours.  Invalid input(s): FREET3 Anemia work up Recent Labs    09/13/20 0430 09/14/20 0703  FERRITIN 54 60   Urinalysis    Component Value Date/Time   COLORURINE YELLOW (A) 01/07/2020 1532   APPEARANCEUR Cloudy (A) 05/27/2020 1227   LABSPEC 1.029 01/07/2020 1532   LABSPEC 1.019 02/10/2014 1524   PHURINE 5.0 01/07/2020 1532   GLUCOSEU Negative 05/27/2020 1227   GLUCOSEU Negative 02/10/2014 1524   HGBUR NEGATIVE 01/07/2020 1532   BILIRUBINUR Negative 05/27/2020 1227   BILIRUBINUR positive 05/27/2020 1143   BILIRUBINUR Negative 02/10/2014 1524   KETONESUR NEGATIVE 01/07/2020 1532   PROTEINUR Trace 05/27/2020 1227   PROTEINUR Positive (A) 05/27/2020 1143   PROTEINUR NEGATIVE 01/07/2020 1532   UROBILINOGEN negative (A) 05/27/2020 1143   NITRITE Negative 05/27/2020 1227   NITRITE negative 05/27/2020 1143   NITRITE NEGATIVE 01/07/2020 1532   LEUKOCYTESUR Negative 05/27/2020 1227   LEUKOCYTESUR Negative 05/27/2020 1143   LEUKOCYTESUR NEGATIVE 01/07/2020 1532   LEUKOCYTESUR Negative 02/10/2014 1524   Sepsis Labs Invalid input(s): PROCALCITONIN,  WBC,  LACTICIDVEN Microbiology No results found for this  or any previous visit (from the past 240 hour(s)).  Time coordinating discharge: Over 30 minutes  SIGNED:  Arnetha Courser, MD  [facility] 09/14/2020, 10:57 AM  If 7PM-7AM, please contact night-coverage [email or web address]  This record has been created using Conservation officer, historic buildings. Errors have been sought and corrected,but may not always be located. Such creation errors do not reflect on the standard of care.

## 2020-09-15 ENCOUNTER — Ambulatory Visit (HOSPITAL_COMMUNITY)
Admission: RE | Admit: 2020-09-15 | Discharge: 2020-09-15 | Disposition: A | Discharge status: Home / Self Care | Payer: 59 | Source: Ambulatory Visit | Attending: Pulmonary Disease | Admitting: Pulmonary Disease

## 2020-09-15 ENCOUNTER — Ambulatory Visit (HOSPITAL_COMMUNITY): Payer: 59

## 2020-09-15 ENCOUNTER — Telehealth: Payer: Self-pay

## 2020-09-15 DIAGNOSIS — U071 COVID-19: Secondary | ICD-10-CM | POA: Insufficient documentation

## 2020-09-15 DIAGNOSIS — J1289 Other viral pneumonia: Secondary | ICD-10-CM | POA: Insufficient documentation

## 2020-09-15 MED ORDER — DIPHENHYDRAMINE HCL 50 MG/ML IJ SOLN: 50.0000 mg | Freq: Once | INTRAMUSCULAR | Status: DC | PRN

## 2020-09-15 MED ORDER — FAMOTIDINE IN NACL 20-0.9 MG/50ML-% IV SOLN: 20.0000 mg | Freq: Once | INTRAVENOUS | Status: DC | PRN

## 2020-09-15 MED ORDER — ALBUTEROL SULFATE HFA 108 (90 BASE) MCG/ACT IN AERS: 2.0000 | INHALATION_SPRAY | Freq: Once | RESPIRATORY_TRACT | Status: DC | PRN

## 2020-09-15 MED ORDER — SODIUM CHLORIDE 0.9 % IV SOLN: INTRAVENOUS | Status: DC | PRN

## 2020-09-15 MED ORDER — SODIUM CHLORIDE 0.9 % IV SOLN
100.0000 mg | Freq: Once | INTRAVENOUS | Status: AC
  Administered 2020-09-15: 100 mg via INTRAVENOUS
  Filled 2020-09-15: qty 20

## 2020-09-15 MED ORDER — METHYLPREDNISOLONE SODIUM SUCC 125 MG IJ SOLR: 125.0000 mg | Freq: Once | INTRAMUSCULAR | Status: DC | PRN

## 2020-09-15 MED ORDER — EPINEPHRINE 0.3 MG/0.3ML IJ SOAJ: 0.3000 mg | Freq: Once | INTRAMUSCULAR | Status: DC | PRN

## 2020-09-15 NOTE — Progress Notes (Signed)
  Diagnosis: COVID-19  Physician: Dr. Shan Levans   Procedure: [facility] Med: remdesivir infusion - Provided patient with remdesivir fact sheet for patients, parents and caregivers prior to infusion.  Complications: No immediate complications noted.  Discharge: Discharged home   Gregary Signs 09/15/2020

## 2020-09-15 NOTE — Discharge Instructions (Signed)
10 Things You Can Do to Manage Your COVID-19 Symptoms at Home °If you have possible or confirmed COVID-19: °1. Stay home except to get medical care. °2. Monitor your symptoms carefully. If your symptoms get worse, call your healthcare provider immediately. °3. Get rest and stay hydrated. °4. If you have a medical appointment, call the healthcare provider ahead of time and tell them that you have or may have COVID-19. °5. For medical emergencies, call 911 and notify the dispatch personnel that you have or may have COVID-19. °6. Cover your cough and sneezes with a tissue or use the inside of your elbow. °7. Wash your hands often with soap and water for at least 20 seconds or clean your hands with an alcohol-based hand sanitizer that contains at least 60% alcohol. °8. As much as possible, stay in a specific room and away from other people in your home. Also, you should use a separate bathroom, if available. If you need to be around other people in or outside of the home, wear a mask. °9. Avoid sharing personal items with other people in your household, like dishes, towels, and bedding. °10. Clean all surfaces that are touched often, like counters, tabletops, and doorknobs. Use household cleaning sprays or wipes according to the label instructions. °cdc.gov/coronavirus °03/13/2020 °This information is not intended to replace advice given to you by your health care provider. Make sure you discuss any questions you have with your health care provider. °Document Revised: 06/29/2020 Document Reviewed: 06/29/2020 °Elsevier Patient Education © 2021 Elsevier Inc. °If you have any questions or concerns after the infusion please call the Advanced Practice Provider on call at [phone]. This number is ONLY intended for your use regarding questions or concerns about the infusion post-treatment side-effects.  Please do not provide this number to others for use. For return to work notes please contact your primary care provider.   ° °If someone you know is interested in receiving treatment please have them contact their MD for a referral or visit [email or web address] ° ° ° °

## 2020-09-15 NOTE — Telephone Encounter (Signed)
Thanks, I will look into this.

## 2020-09-17 ENCOUNTER — Ambulatory Visit: Payer: 59 | Admitting: Hospice and Palliative Medicine

## 2020-09-17 ENCOUNTER — Telehealth: Payer: Self-pay

## 2020-09-17 ENCOUNTER — Encounter: Payer: Self-pay | Admitting: Internal Medicine

## 2020-09-17 VITALS — HR 101 | Temp 98.4°F | Ht 66.5 in | Wt 160.0 lb

## 2020-09-17 DIAGNOSIS — J1282 Pneumonia due to coronavirus disease 2019: Secondary | ICD-10-CM | POA: Diagnosis not present

## 2020-09-17 DIAGNOSIS — D869 Sarcoidosis, unspecified: Secondary | ICD-10-CM

## 2020-09-17 DIAGNOSIS — R0902 Hypoxemia: Secondary | ICD-10-CM | POA: Diagnosis not present

## 2020-09-17 DIAGNOSIS — U071 COVID-19: Secondary | ICD-10-CM | POA: Diagnosis not present

## 2020-09-17 MED ORDER — LEVOFLOXACIN 750 MG PO TABS: 750.0000 mg | ORAL_TABLET | Freq: Every day | ORAL | 0 refills | Status: DC

## 2020-09-17 NOTE — Telephone Encounter (Signed)
Gave [facility] for Oxygen. Brittany Huynh

## 2020-09-17 NOTE — Progress Notes (Signed)
[facility] [address] [place], [place] [address]  Internal MEDICINE  Telephone Visit  Patient Name: Brittany Huynh  [id number]  [id number]  Date of Service: 09/17/2020  Transitional Care Management  I connected with the patient at 0922 by telephone and verified the patients identity using two identifiers.   I discussed the limitations, risks, security and privacy concerns of performing an evaluation and management service by telephone and the availability of in person appointments. I also discussed with the patient that there may be a patient responsible charge related to the service.  The patient expressed understanding and agrees to proceed.    Chief Complaint  Patient presents with  . Telephone Assessment    [id number]  . Telephone Screen  . Hospitalization Follow-up    Covid pneumonia   . Cough  . Shortness of Breath    HPI Patient is being seen for hospital follow-up Hospitalized 09/13/2020-09/14/2020 for COVID-19 pneumonia with history of sarcoidosis CXR 1/15 Patchy airspace opacities at both lung bases, left greater than right, more consistent with atypical/viral pneumonia  Dropping oxygen saturations into the 70%'s with ambulation prompted her to be seen in emergency department Treated with Remdesivir and prednisone--discharged on 50 mg prednisone for 1 week followed by resuming her previous dosing  Today she is not feeling any better She is complaining of shortness of breath, chest pressure, coughing and fatigue With exertion her dyspnea worsens  She has been monitoring her SpO2 levels at home--resting SpO2 90% with ambulation today her SpO2 levels dropped to 84%   Current Medication: Outpatient Encounter Medications as of 09/17/2020  Medication Sig Note  . albuterol (PROVENTIL) (2.5 MG/3ML) 0.083% nebulizer solution Take 3 mLs (2.5 mg total) by nebulization every 6 (six) hours as needed for wheezing or shortness of breath.   Marland Kitchen albuterol (VENTOLIN  HFA) 108 (90 Base) MCG/ACT inhaler Inhale 2 puffs into the lungs every 6 (six) hours as needed for wheezing or shortness of breath.   Marland Kitchen ascorbic acid (VITAMIN C) 500 MG tablet Take 1 tablet (500 mg total) by mouth daily.   Marland Kitchen aspirin EC 81 MG EC tablet Take 1 tablet (81 mg total) by mouth daily. Swallow whole.   Marland Kitchen aspirin-acetaminophen-caffeine (EXCEDRIN MIGRAINE) 250-250-65 MG per tablet Take 1 tablet by mouth every 6 (six) hours as needed for headache.   . augmented betamethasone dipropionate (DIPROLENE-AF) 0.05 % ointment Apply 1 application topically 2 (two) times daily.   . benzonatate (TESSALON) 100 MG capsule Take 1 capsule (100 mg total) by mouth 2 (two) times daily as needed for cough.   . bisoprolol-hydrochlorothiazide (ZIAC) 2.5-6.25 MG tablet Take one tab po qd for HTN   . celecoxib (CELEBREX) 200 MG capsule Take 1 capsule (200 mg total) by mouth daily.   . cholecalciferol (VITAMIN D) 25 MCG tablet Take 1 tablet (1,000 Units total) by mouth daily.   Marland Kitchen dicyclomine (BENTYL) 10 MG capsule TAKE 1 CAPSULE BY MOUTH EVERY DAY   . DULoxetine (CYMBALTA) 30 MG capsule Take 1 capsule (30 mg total) by mouth daily.   . hydroxychloroquine (PLAQUENIL) 200 MG tablet Take 200 mg by mouth 2 (two) times daily.   Marland Kitchen levofloxacin (LEVAQUIN) 750 MG tablet Take 1 tablet (750 mg total) by mouth daily.   . mirtazapine (REMERON) 30 MG tablet Take 1 tablet (30 mg total) by mouth at bedtime.   Marland Kitchen omeprazole (PRILOSEC) 40 MG capsule Take 1 capsule (40 mg total) by mouth daily before breakfast.   . predniSONE (DELTASONE)  10 MG tablet Take 1 tablet (10 mg total) by mouth daily with breakfast. 09/13/2020: Pt takes 2 tablets with a 10 mg tablet for total dose of 12 mg  . predniSONE (DELTASONE) 50 MG tablet Take 1 tablet (50 mg total) by mouth daily for 7 days.   Marland Kitchen zinc sulfate 220 (50 Zn) MG capsule Take 1 capsule (220 mg total) by mouth daily.   Marland Kitchen zolpidem (AMBIEN) 10 MG tablet Take 1 tablet (10 mg total) by mouth at  bedtime as needed for sleep.    No facility-administered encounter medications on file as of 09/17/2020.    Surgical History: Past Surgical History:  Procedure Laterality Date  . ABDOMINAL HYSTERECTOMY  2009  . BREAST BIOPSY Left 02/15/2018   PREDOMINANTLY MATURE ADIPOSE TISSUE WITH VERY FOCAL AREAS OF BENIGN   . BREAST EXCISIONAL BIOPSY    . chamberlain procedure   2011  . COLONOSCOPY WITH PROPOFOL N/A 09/02/2020   Procedure: COLONOSCOPY WITH PROPOFOL;  Surgeon: Toney Reil, MD;  Location: [facility] ENDOSCOPY;  Service: Gastroenterology;  Laterality: N/A;  . ESOPHAGOGASTRODUODENOSCOPY (EGD) WITH PROPOFOL N/A 09/02/2020   Procedure: ESOPHAGOGASTRODUODENOSCOPY (EGD) WITH PROPOFOL;  Surgeon: Toney Reil, MD;  Location: [facility] ENDOSCOPY;  Service: Gastroenterology;  Laterality: N/A;  . IRRIGATION AND DEBRIDEMENT HEMATOMA Left 03/07/2018   Procedure: IRRIGATION AND DEBRIDEMENT HEMATOMA-LEFT BREAST;  Surgeon: Earline Mayotte, MD;  Location: [facility] ORS;  Service: General;  Laterality: Left;  . LUNG BIOPSY  2011  . RESECTION RIBS EXTRAPLEURAL  [id number]    Medical History: Past Medical History:  Diagnosis Date  . Anxiety   . Asthma   . Breast fibrocystic disorder 2013  . Hernia 2011   chest wall  . Lung mass 2011  . Sarcoidosis   . Shortness of breath    2011    Family History: Family History  Problem Relation Age of Onset  . Liver cancer Mother 61  . Bone cancer Maternal Aunt 60  . Breast cancer Maternal Aunt   . Breast cancer Paternal Aunt   . Kidney cancer Father   . Bone cancer Paternal Aunt 61    Social History   Socioeconomic History  . Marital status: Divorced    Spouse name: Not on file  . Number of children: Not on file  . Years of education: Not on file  . Highest education level: Not on file  Occupational History  . Not on file  Tobacco Use  . Smoking status: Never Smoker  . Smokeless tobacco: Never Used  Vaping Use  . Vaping Use: Never used   Substance and Sexual Activity  . Alcohol use: Yes    Comment: on occassion  . Drug use: No  . Sexual activity: Not on file  Other Topics Concern  . Not on file  Social History Narrative  . Not on file   Social Determinants of Health   Financial Resource Strain: Not on file  Food Insecurity: Not on file  Transportation Needs: Not on file  Physical Activity: Not on file  Stress: Not on file  Social Connections: Not on file  Intimate Partner Violence: Not on file      Review of Systems  Constitutional: Positive for fatigue and fever. Negative for chills and diaphoresis.  HENT: Positive for congestion and voice change. Negative for ear pain, postnasal drip and sinus pressure.   Eyes: Negative for photophobia, discharge, redness, itching and visual disturbance.  Respiratory: Positive for cough, shortness of breath and wheezing.   Cardiovascular:  Negative for chest pain, palpitations and leg swelling.  Gastrointestinal: Negative for abdominal pain, constipation, diarrhea, nausea and vomiting.  Genitourinary: Negative for dysuria and flank pain.  Musculoskeletal: Negative for arthralgias, back pain, gait problem and neck pain.  Skin: Negative for color change.  Allergic/Immunologic: Negative for environmental allergies and food allergies.  Neurological: Positive for headaches. Negative for dizziness.  Hematological: Does not bruise/bleed easily.  Psychiatric/Behavioral: Negative for agitation, behavioral problems (depression) and hallucinations.    Vital Signs: Pulse (!) 101   Temp 98.4 F (36.9 C)   Ht 5' 6.5" (1.689 m)   Wt 160 lb (72.6 kg)   SpO2 90%   BMI 25.44 kg/m    Observation/Objective: Able to answer questions appropriately, appears acutely ill, no acute distress/respiratory distress observed.  Assessment/Plan: 1. Pneumonia due to COVID-19 virus Start Levaquin for development of COVID pneumonia with history of sarcoidosis Increase fluid intake, treat  fevers with acetaminophen/ibuprfen, encouraged to take Vitamin C, D and Zinc - levofloxacin (LEVAQUIN) 750 MG tablet; Take 1 tablet (750 mg total) by mouth daily.  Dispense: 7 tablet; Refill: 0  2. Sarcoidosis Low threshold for hospitalization, monitor SpO2 levels closely Will check on her daily to monitor symptoms and progression  3. Hypoxia Resting SpO2 levels 90%, SpO2 levels with ambulation 84% Start on 2LPM via nasal cannula for acute hypoxia related to development of COVID pneumonia with underlying sarcoidosis - For home use only DME oxygen  General Counseling: Brittany Huynh verbalizes understanding of the findings of today's phone visit and agrees with plan of treatment. I have discussed any further diagnostic evaluation that may be needed or ordered today. We also reviewed her medications today. she has been encouraged to call the office with any questions or concerns that should arise related to todays visit.    Orders Placed This Encounter  Procedures  . For home use only DME oxygen    Meds ordered this encounter  Medications  . levofloxacin (LEVAQUIN) 750 MG tablet    Sig: Take 1 tablet (750 mg total) by mouth daily.    Dispense:  7 tablet    Refill:  0     Time spent: 30 Minutes Time spent includes review of chart, medications, test results and follow-up plan with the patient.  Lubertha Basque Oswell Say AGNP-C Internal medicine

## 2020-09-18 DIAGNOSIS — R0902 Hypoxemia: Secondary | ICD-10-CM | POA: Diagnosis not present

## 2020-09-18 DIAGNOSIS — D860 Sarcoidosis of lung: Secondary | ICD-10-CM | POA: Diagnosis not present

## 2020-09-25 ENCOUNTER — Telehealth: Payer: Self-pay

## 2020-09-25 NOTE — Telephone Encounter (Signed)
Patient disability paperwork completed and ready for pick up. Brittany Huynh

## 2020-09-29 ENCOUNTER — Encounter: Payer: Self-pay | Admitting: Gastroenterology

## 2020-09-29 ENCOUNTER — Encounter: Payer: Self-pay | Admitting: Oncology

## 2020-09-29 ENCOUNTER — Other Ambulatory Visit: Payer: Self-pay

## 2020-09-29 ENCOUNTER — Ambulatory Visit: Payer: 59 | Admitting: Gastroenterology

## 2020-09-29 ENCOUNTER — Inpatient Hospital Stay: Payer: 59 | Attending: Oncology | Admitting: Oncology

## 2020-09-29 ENCOUNTER — Inpatient Hospital Stay: Payer: 59

## 2020-09-29 VITALS — BP 104/70 | HR 87 | Temp 97.9°F | Ht 66.5 in | Wt 167.5 lb

## 2020-09-29 VITALS — BP 119/85 | HR 79 | Temp 98.8°F | Resp 16 | Ht 66.5 in | Wt 166.7 lb

## 2020-09-29 DIAGNOSIS — R140 Abdominal distension (gaseous): Secondary | ICD-10-CM | POA: Diagnosis not present

## 2020-09-29 DIAGNOSIS — R1010 Upper abdominal pain, unspecified: Secondary | ICD-10-CM | POA: Diagnosis not present

## 2020-09-29 DIAGNOSIS — R195 Other fecal abnormalities: Secondary | ICD-10-CM

## 2020-09-29 DIAGNOSIS — Z8616 Personal history of COVID-19: Secondary | ICD-10-CM

## 2020-09-29 DIAGNOSIS — R3915 Urgency of urination: Secondary | ICD-10-CM | POA: Diagnosis not present

## 2020-09-29 DIAGNOSIS — D696 Thrombocytopenia, unspecified: Secondary | ICD-10-CM

## 2020-09-29 DIAGNOSIS — R197 Diarrhea, unspecified: Secondary | ICD-10-CM | POA: Diagnosis not present

## 2020-09-29 DIAGNOSIS — D509 Iron deficiency anemia, unspecified: Secondary | ICD-10-CM

## 2020-09-29 DIAGNOSIS — R300 Dysuria: Secondary | ICD-10-CM | POA: Insufficient documentation

## 2020-09-29 DIAGNOSIS — Z79899 Other long term (current) drug therapy: Secondary | ICD-10-CM | POA: Insufficient documentation

## 2020-09-29 LAB — URINALYSIS, COMPLETE (UACMP) WITH MICROSCOPIC
Bacteria, UA: NONE SEEN
Bilirubin Urine: NEGATIVE
Glucose, UA: NEGATIVE mg/dL
Hgb urine dipstick: NEGATIVE
Ketones, ur: NEGATIVE mg/dL
Leukocytes,Ua: NEGATIVE
Nitrite: NEGATIVE
Protein, ur: NEGATIVE mg/dL
Specific Gravity, Urine: 1.019 (ref 1.005–1.030)
pH: 5 (ref 5.0–8.0)

## 2020-09-29 NOTE — Progress Notes (Signed)
Pt states that after colonoscopy she no longer has loose bowels , she has formed stools now. She also said that she has numbness and tingling feet and hands. She uses a heating pad on feet every night. She feels that she gets urge to urinate and she has to go fast to avoid the feeling she will have an accident. She sometimes has chest pain on exertion. She sleeps good if she takes her sleep med

## 2020-09-29 NOTE — Progress Notes (Signed)
Hematology/Oncology Consult note [facility] Telephone:([phone] [phone]  Patient Care Team: Lyndon Code, MD as PCP - General (Internal Medicine) Kieth Brightly, MD as Consulting Physician (General Surgery)   Name of the patient: Brittany Huynh  [id number]  [birth year]    Reason for referral-history of iron deficiency   Referring physician-Dr. [name]  Date of visit: 09/29/20   History of presenting illness-patient is a 55 year old female who was seen by Dr. [name] for symptoms of diarrhea and early satiety.  She underwent a colonoscopy which was unremarkable.  EGD showed scattered gastric erosions which were biopsied and was negative for H. pylori and celiac disease.  Negative for malignancy.She has a history of iron deficiency anemia and has therefore been referred to [place].  Most recent CBC from 09/14/2020 showed white count of 3.4, H&H of 12.8/39 with an MCV of 82 and a platelet count of 138.  Over the last 6 years her CBC fluctuates between 12-15.  Iron study showed a normal ferritin of 60.  Patient tested positive for Covid in mid January 2022.  Patient presently reports fatigue.  She did require oxygen following her Covid infection but is presently off oxygen.  She reports urinary frequency and incomplete sensation of emptying.  Reports generalized breast tenderness  ECOG PS- 1     Review of systems- Review of Systems  Constitutional: Positive for malaise/fatigue. Negative for chills, fever and weight loss.  HENT: Negative for congestion, ear discharge and nosebleeds.   Eyes: Negative for blurred vision.  Respiratory: Negative for cough, hemoptysis, sputum production, shortness of breath and wheezing.   Cardiovascular: Negative for chest pain, palpitations, orthopnea and claudication.  Gastrointestinal: Negative for abdominal pain, blood in stool, constipation, diarrhea, heartburn, melena, nausea and vomiting.       Early satiety   Genitourinary: Positive for urgency. Negative for dysuria, flank pain, frequency and hematuria.  Musculoskeletal: Negative for back pain, joint pain and myalgias.  Skin: Negative for rash.  Neurological: Negative for dizziness, tingling, focal weakness, seizures, weakness and headaches.  Endo/Heme/Allergies: Does not bruise/bleed easily.  Psychiatric/Behavioral: Negative for depression and suicidal ideas. The patient does not have insomnia.     Allergies  Allergen Reactions  . Onion Anaphylaxis  . Adhesive [Tape] Rash    Patient Active Problem List   Diagnosis Date Noted  . Pneumonia due to COVID-19 virus 09/13/2020  . Arthralgia 08/20/2019  . Osteopenia of lumbar spine 08/20/2019  . Asthma 12/06/2018  . Other atopic dermatitis 04/07/2018  . Breast pain, left 02/28/2018  . Numbness and tingling 02/12/2018  . Essential hypertension 01/14/2018  . Other chest pain 01/14/2018  . Fatigue 01/14/2018  . Polyneuropathic pain 01/14/2018  . Acute vaginitis 01/14/2018  . Unspecified menopausal and perimenopausal disorder 01/14/2018  . Vitamin D deficiency 01/14/2018  . Dysuria 01/14/2018  . History of chickenpox 11/27/2017  . Shingles 11/27/2017  . 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 Shortness of breath 09/15/2017  . Edema 09/15/2017  . Recurrent major depressive disorder (HCC) 09/15/2017  . Migraine 09/15/2017  . Inflammatory polyarthritis (HCC) 09/15/2017  . Myopathy 09/15/2017  . Long term current use of systemic steroids 09/15/2017  . Tachycardia, unspecified 09/15/2017  . Gastro-esophageal reflux disease without esophagitis 09/15/2017  . Hypersomnia, unspecified 09/15/2017  . Iron deficiency anemia 09/15/2017  . Neuralgia and neuritis 09/15/2017  . Restless leg syndrome 09/15/2017  . Generalized anxiety disorder 09/15/2017  . Fibrocystic breast disease 04/05/2013  . Screening for breast cancer 04/05/2013  .  Posttraumatic hematoma of left breast 04/05/2013      Past Medical History:  Diagnosis Date  . Anemia   . Anxiety   . Asthma   . Breast fibrocystic disorder 2013  . Hernia 2011   chest wall  . Lung mass 2011  . Sarcoidosis   . Shortness of breath    2011     Past Surgical History:  Procedure Laterality Date  . ABDOMINAL HYSTERECTOMY  2009  . BREAST BIOPSY Left 02/15/2018   PREDOMINANTLY MATURE ADIPOSE TISSUE WITH VERY FOCAL AREAS OF BENIGN   . BREAST EXCISIONAL BIOPSY    . chamberlain procedure   2011  . COLONOSCOPY WITH PROPOFOL N/A 09/02/2020   Procedure: COLONOSCOPY WITH PROPOFOL;  Surgeon: Toney Reil, MD;  Location: [facility] ENDOSCOPY;  Service: Gastroenterology;  Laterality: N/A;  . ESOPHAGOGASTRODUODENOSCOPY (EGD) WITH PROPOFOL N/A 09/02/2020   Procedure: ESOPHAGOGASTRODUODENOSCOPY (EGD) WITH PROPOFOL;  Surgeon: Toney Reil, MD;  Location: [facility] ENDOSCOPY;  Service: Gastroenterology;  Laterality: N/A;  . IRRIGATION AND DEBRIDEMENT HEMATOMA Left 03/07/2018   Procedure: IRRIGATION AND DEBRIDEMENT HEMATOMA-LEFT BREAST;  Surgeon: Earline Mayotte, MD;  Location: [facility] ORS;  Service: General;  Laterality: Left;  . LUNG BIOPSY  2011  . RESECTION RIBS EXTRAPLEURAL  73220    Social History   Socioeconomic History  . Marital status: Divorced    Spouse name: Not on file  . Number of children: Not on file  . Years of education: Not on file  . Highest education level: Not on file  Occupational History  . Not on file  Tobacco Use  . Smoking status: Never Smoker  . Smokeless tobacco: Never Used  Vaping Use  . Vaping Use: Never used  Substance and Sexual Activity  . Alcohol use: Yes    Comment: on occassion  . Drug use: No  . Sexual activity: Not on file  Other Topics Concern  . Not on file  Social History Narrative  . Not on file   Social Determinants of Health   Financial Resource Strain: Not on file  Food Insecurity: Not on file  Transportation Needs: Not on file  Physical Activity: Not on file   Stress: Not on file  Social Connections: Not on file  Intimate Partner Violence: Not on file     Family History  Problem Relation Age of Onset  . Liver cancer Mother 80  . Bone cancer Maternal Aunt 60  . Breast cancer Maternal Aunt   . Breast cancer Paternal Aunt   . Kidney cancer Father   . Bone cancer Paternal Aunt 69     Current Outpatient Medications:  .  acetaminophen (TYLENOL) 500 MG tablet, Take 500 mg by mouth every 6 (six) hours as needed., Disp: , Rfl:  .  albuterol (PROVENTIL) (2.5 MG/3ML) 0.083% nebulizer solution, Take 3 mLs (2.5 mg total) by nebulization every 6 (six) hours as needed for wheezing or shortness of breath., Disp: 150 mL, Rfl: 1 .  albuterol (VENTOLIN HFA) 108 (90 Base) MCG/ACT inhaler, Inhale 2 puffs into the lungs every 6 (six) hours as needed for wheezing or shortness of breath., Disp: 18 g, Rfl: 3 .  ascorbic acid (VITAMIN C) 500 MG tablet, Take 1 tablet (500 mg total) by mouth daily., Disp: 90 tablet, Rfl: 0 .  aspirin EC 81 MG EC tablet, Take 1 tablet (81 mg total) by mouth daily. Swallow whole., Disp: 30 tablet, Rfl: 11 .  benzonatate (TESSALON) 100 MG capsule, Take 1 capsule (100 mg total)  by mouth 2 (two) times daily as needed for cough., Disp: 20 capsule, Rfl: 0 .  bisoprolol-hydrochlorothiazide (ZIAC) 2.5-6.25 MG tablet, Take one tab po qd for HTN, Disp: 30 tablet, Rfl: 6 .  celecoxib (CELEBREX) 200 MG capsule, Take 1 capsule (200 mg total) by mouth daily., Disp: 90 capsule, Rfl: 3 .  cholecalciferol (VITAMIN D) 25 MCG tablet, Take 1 tablet (1,000 Units total) by mouth daily., Disp: 90 tablet, Rfl: 0 .  dicyclomine (BENTYL) 10 MG capsule, TAKE 1 CAPSULE BY MOUTH EVERY DAY, Disp: 90 capsule, Rfl: 0 .  DULoxetine (CYMBALTA) 30 MG capsule, Take 1 capsule (30 mg total) by mouth daily., Disp: 90 capsule, Rfl: 1 .  hydroxychloroquine (PLAQUENIL) 200 MG tablet, Take 200 mg by mouth 2 (two) times daily., Disp: , Rfl:  .  mirtazapine (REMERON) 30 MG tablet,  Take 1 tablet (30 mg total) by mouth at bedtime., Disp: 90 tablet, Rfl: 0 .  omeprazole (PRILOSEC) 40 MG capsule, Take 1 capsule (40 mg total) by mouth daily before breakfast., Disp: 30 capsule, Rfl: 2 .  predniSONE (DELTASONE) 1 MG tablet, Take 2 mg by mouth daily with breakfast. Each day with fod and in combination of 10 mg prednisone for a total dose every  day 12 mg, Disp: , Rfl:  .  predniSONE (DELTASONE) 10 MG tablet, Take 1 tablet (10 mg total) by mouth daily with breakfast., Disp: 30 tablet, Rfl: 3 .  zinc sulfate 220 (50 Zn) MG capsule, Take 1 capsule (220 mg total) by mouth daily., Disp: 90 capsule, Rfl: 0 .  zolpidem (AMBIEN) 10 MG tablet, Take 1 tablet (10 mg total) by mouth at bedtime as needed for sleep., Disp: 30 tablet, Rfl: 1   Physical exam:  Vitals:   09/29/20 1341  BP: 119/85  Pulse: 79  Resp: 16  Temp: 98.8 F (37.1 C)  TempSrc: Oral  Weight: 166 lb 11.2 oz (75.6 kg)  Height: 5' 6.5" (1.689 m)   Physical Exam HENT:     Head: Normocephalic and atraumatic.  Eyes:     Extraocular Movements: EOM normal.     Pupils: Pupils are equal, round, and reactive to light.  Cardiovascular:     Rate and Rhythm: Normal rate and regular rhythm.     Heart sounds: Normal heart sounds.  Pulmonary:     Effort: Pulmonary effort is normal.     Breath sounds: Normal breath sounds.  Abdominal:     General: Bowel sounds are normal.     Palpations: Abdomen is soft.  Musculoskeletal:     Cervical back: Normal range of motion.  Lymphadenopathy:     Comments: No palpable cervical, supraclavicular, axillary or inguinal adenopathy   Skin:    General: Skin is warm and dry.  Neurological:     Mental Status: She is alert and oriented to person, place, and time.   Breast exam: No palpable masses in bilateral breast.  No palpable bilateral axillary adenopathy.    CMP Latest Ref Rng & Units 09/14/2020  Glucose 70 - 99 mg/dL 161(W)  BUN 6 - 20 mg/dL 14  Creatinine 9.60 - 4.54 mg/dL  0.98  Sodium 119 - 147 mmol/L 141  Potassium 3.5 - 5.1 mmol/L 3.6  Chloride 98 - 111 mmol/L 110  CO2 22 - 32 mmol/L 23  Calcium 8.9 - 10.3 mg/dL 8.1(L)  Total Protein 6.5 - 8.1 g/dL 6.4(L)  Total Bilirubin 0.3 - 1.2 mg/dL 0.4  Alkaline Phos 38 - 126 U/L 54  AST  15 - 41 U/L 25  ALT 0 - 44 U/L 20   CBC Latest Ref Rng & Units 09/14/2020  WBC 4.0 - 10.5 K/uL 3.4(L)  Hemoglobin 12.0 - 15.0 g/dL 01.6  Hematocrit 01.0 - 46.0 % 39.0  Platelets 150 - 400 K/uL 138(L)    No images are attached to the encounter.  DG Chest 2 View  Result Date: 09/12/2020 CLINICAL DATA:  Shortness of breath, COVID positive EXAM: CHEST - 2 VIEW COMPARISON:  October 07, 2019 FINDINGS: The heart size and mediastinal contours are within normal limits. Patchy airspace opacity is seen at the periphery of the left lung base and a small focus of patchy airspace opacity at the right lung base. A small left pleural effusion is seen. No acute osseous abnormality. IMPRESSION: Patchy airspace opacities at both lung bases, left greater than right, likely consistent with atypical viral pneumonia. Small left pleural effusion Electronically Signed   By: Jonna Clark M.D.   On: 09/12/2020 23:25    Assessment and plan- Patient is a 55 y.o. female referred for history of iron deficiency  Most recent CBC showed an H&H of 12.8/39.  She is not overtly anemic.  No evidence of microcytosis.  Ferritin levels were normal at 60 although they could be sometimes falsely normal in the setting of Covid.  Mild thrombocytopenia and leukopenia could also be seen in the setting of post Covid.  She has a baseline history of sarcoidosis as well.  At this time there is no overt evidence of iron deficiency and she is still recovering from her Covid diagnosis.  I would like to repeat her CBC ferritin and iron studies B12 TSH and reticulocyte count in about 1 month's time and see her thereafter for a video visit  Patient reports urinary urgency and  incomplete sensation of bladder emptying.  Will check urinalysis and urine culture today.   Thank you for this kind referral and the opportunity to participate in the care of this patient   Visit Diagnosis 1. Dysuria   2. Iron deficiency anemia, unspecified iron deficiency anemia type     Dr. Owens Shark, MD, MPH [facility] [phone] 09/29/2020 4:15 PM

## 2020-09-29 NOTE — Progress Notes (Signed)
Brittany Repress, MD [address]  [address]  [place], [place] [address]  Main: [phone]  Fax: [phone]    Gastroenterology Consultation  Referring Provider:     Lyndon Code, MD Primary Care Physician:  Lyndon Code, MD Primary Gastroenterologist:  Dr. Arlyss Huynh Reason for Consultation:     Chronic diarrhea, abdominal bloating        HPI:   Brittany Huynh is a 55 y.o. female referred by Dr. Welton Flakes, Shannan Harper, MD  for consultation & management of chronic diarrhea.  Patient reports approximately 2 months history of nonbloody watery bowel movements anywhere from [facility] stool scale 5-7, about 5-6 times a day, not necessarily postprandial associated with lower abdominal cramps as well as bloating.  She does report urgency but did not have any fecal incontinence.  Denies any weight loss.  She denies consumption of carbonated beverages or artificial sweeteners regularly.  She is noted to have chronic iron deficiency, ferritin less than 25 and recently mild anemia, hemoglobin 11.9.  Had B12 deficiency in 2019, was receiving B12 injections, until recently.  Her B12 levels were normal in 02/2019.  She does have history of chronic migraine headaches for which she takes Excedrin intermittently.  Patient does have history of sarcoidosis.  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  She is currently on omeprazole 40 mg once a day which does help with her upper GI symptoms partially.  She reports ongoing abdominal bloating and upper abdominal discomfort.  She reports that she is no longer experiencing diarrhea, however her stools are mushy and foul-smelling, on Bristol stool scale 5.  She was seen by Dr. Smith Robert for iron deficiency.  Her most recent ferritin levels are normal.  She is currently taking over-the-counter iron  supplements.  She is also taking over-the-counter B12 supplements.  Her weight is stable.  Patient was recently hospitalized in January secondary to Covid pneumonia, treated with 11 days where and prednisone Patient reports that she had multiple deaths in her family within last 1 year.  She does not have good sleep  NSAIDs: Excedrin for migraine headaches  Antiplts/Anticoagulants/Anti thrombotics: None  GI Procedures:  Upper endoscopy 09/02/2020 - Normal duodenal bulb and second portion of the duodenum. Biopsied. - Erosive gastropathy with no bleeding and no stigmata of recent bleeding. Biopsied. - Normal cardia, gastric fundus and gastric body. Biopsied. - Small hiatal hernia. - Esophagogastric landmarks identified. - Widely patent and non-obstructing Schatzki ring.  DIAGNOSIS:  A. DUODENUM; COLD BIOPSY:  - ENTERIC MUCOSA WITH PRESERVED VILLOUS ARCHITECTURE AND NO SIGNIFICANT  HISTOPATHOLOGIC CHANGE.  - NEGATIVE FOR FEATURES OF CELIAC, DYSPLASIA, AND MALIGNANCY.   B. STOMACH; COLD BIOPSY:  - GASTRIC ANTRAL AND OXYNTIC MUCOSA WITH FEATURES OF MILD REACTIVE  GASTROPATHY.  - NEGATIVE FOR H. PYLORI, DYSPLASIA, AND MALIGNANCY.   Colonoscopy 09/02/2020 - Preparation of the colon was fair. - The examined portion of the ileum was normal. - Diverticulosis in the sigmoid colon and in the descending colon. - The entire examined colon is normal. - No specimens collected. She denies family history of GI malignancy  Past Medical History:  Diagnosis Date  . Anemia   . Anxiety   . Asthma   . Breast fibrocystic disorder 2013  . Hernia 2011   chest wall  . Lung mass 2011  . Sarcoidosis   .  Shortness of breath    2011    Past Surgical History:  Procedure Laterality Date  . ABDOMINAL HYSTERECTOMY  2009  . BREAST BIOPSY Left 02/15/2018   PREDOMINANTLY MATURE ADIPOSE TISSUE WITH VERY FOCAL AREAS OF BENIGN   . BREAST EXCISIONAL BIOPSY    . chamberlain procedure   2011  . COLONOSCOPY  WITH PROPOFOL N/A 09/02/2020   Procedure: COLONOSCOPY WITH PROPOFOL;  Surgeon: Toney Reil, MD;  Location: [facility] ENDOSCOPY;  Service: Gastroenterology;  Laterality: N/A;  . ESOPHAGOGASTRODUODENOSCOPY (EGD) WITH PROPOFOL N/A 09/02/2020   Procedure: ESOPHAGOGASTRODUODENOSCOPY (EGD) WITH PROPOFOL;  Surgeon: Toney Reil, MD;  Location: [facility] ENDOSCOPY;  Service: Gastroenterology;  Laterality: N/A;  . IRRIGATION AND DEBRIDEMENT HEMATOMA Left 03/07/2018   Procedure: IRRIGATION AND DEBRIDEMENT HEMATOMA-LEFT BREAST;  Surgeon: Earline Mayotte, MD;  Location: [facility] ORS;  Service: General;  Laterality: Left;  . LUNG BIOPSY  2011  . RESECTION RIBS EXTRAPLEURAL  [id number]    Current Outpatient Medications:  .  acetaminophen (TYLENOL) 500 MG tablet, Take 500 mg by mouth every 6 (six) hours as needed., Disp: , Rfl:  .  albuterol (PROVENTIL) (2.5 MG/3ML) 0.083% nebulizer solution, Take 3 mLs (2.5 mg total) by nebulization every 6 (six) hours as needed for wheezing or shortness of breath., Disp: 150 mL, Rfl: 1 .  albuterol (VENTOLIN HFA) 108 (90 Base) MCG/ACT inhaler, Inhale 2 puffs into the lungs every 6 (six) hours as needed for wheezing or shortness of breath., Disp: 18 g, Rfl: 3 .  ascorbic acid (VITAMIN C) 500 MG tablet, Take 1 tablet (500 mg total) by mouth daily., Disp: 90 tablet, Rfl: 0 .  aspirin EC 81 MG EC tablet, Take 1 tablet (81 mg total) by mouth daily. Swallow whole., Disp: 30 tablet, Rfl: 11 .  benzonatate (TESSALON) 100 MG capsule, Take 1 capsule (100 mg total) by mouth 2 (two) times daily as needed for cough., Disp: 20 capsule, Rfl: 0 .  bisoprolol-hydrochlorothiazide (ZIAC) 2.5-6.25 MG tablet, Take one tab po qd for HTN, Disp: 30 tablet, Rfl: 6 .  celecoxib (CELEBREX) 200 MG capsule, Take 1 capsule (200 mg total) by mouth daily., Disp: 90 capsule, Rfl: 3 .  cholecalciferol (VITAMIN D) 25 MCG tablet, Take 1 tablet (1,000 Units total) by mouth daily., Disp: 90 tablet, Rfl: 0 .   dicyclomine (BENTYL) 10 MG capsule, TAKE 1 CAPSULE BY MOUTH EVERY DAY, Disp: 90 capsule, Rfl: 0 .  DULoxetine (CYMBALTA) 30 MG capsule, Take 1 capsule (30 mg total) by mouth daily., Disp: 90 capsule, Rfl: 1 .  hydroxychloroquine (PLAQUENIL) 200 MG tablet, Take 200 mg by mouth 2 (two) times daily., Disp: , Rfl:  .  mirtazapine (REMERON) 30 MG tablet, Take 1 tablet (30 mg total) by mouth at bedtime., Disp: 90 tablet, Rfl: 0 .  omeprazole (PRILOSEC) 40 MG capsule, Take 1 capsule (40 mg total) by mouth daily before breakfast., Disp: 30 capsule, Rfl: 2 .  predniSONE (DELTASONE) 1 MG tablet, Take 2 mg by mouth daily with breakfast. Each day with fod and in combination of 10 mg prednisone for a total dose every  day 12 mg, Disp: , Rfl:  .  predniSONE (DELTASONE) 10 MG tablet, Take 1 tablet (10 mg total) by mouth daily with breakfast., Disp: 30 tablet, Rfl: 3 .  zinc sulfate 220 (50 Zn) MG capsule, Take 1 capsule (220 mg total) by mouth daily., Disp: 90 capsule, Rfl: 0 .  zolpidem (AMBIEN) 10 MG tablet, Take 1 tablet (10 mg total) by  mouth at bedtime as needed for sleep., Disp: 30 tablet, Rfl: 1   Family History  Problem Relation Age of Onset  . Liver cancer Mother 30  . Bone cancer Maternal Aunt 60  . Breast cancer Maternal Aunt   . Breast cancer Paternal Aunt   . Kidney cancer Father   . Bone cancer Paternal Aunt 30     Social History   Tobacco Use  . Smoking status: Never Smoker  . Smokeless tobacco: Never Used  Vaping Use  . Vaping Use: Never used  Substance Use Topics  . Alcohol use: Yes    Comment: on occassion  . Drug use: No    Allergies as of 09/29/2020 - Review Complete 09/29/2020  Allergen Reaction Noted  . 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 Adhesive [tape] Rash 07/03/2012    Review of Systems:    All systems reviewed and negative except where noted in HPI.   Physical Exam:  BP 104/70 (BP Location: Left Arm, Patient Position: Sitting, Cuff Size: Normal)   Pulse 87   Temp  97.9 F (36.6 C) (Oral)   Ht 5' 6.5" (1.689 m)   Wt 167 lb 8 oz (76 kg)   BMI 26.63 kg/m  No LMP recorded. Patient has had a hysterectomy.  General:   Alert,  Well-developed, well-nourished, pleasant and cooperative in NAD Head:  Normocephalic and atraumatic. Eyes:  Sclera clear, no icterus.   Conjunctiva pink. Ears:  Normal auditory acuity. Nose:  No deformity, discharge, or lesions. Mouth:  No deformity or lesions,oropharynx pink & moist. Neck:  Supple; no masses or thyromegaly. Lungs:  Respirations even and unlabored.  Clear throughout to auscultation.   No wheezes, crackles, or rhonchi. No acute distress. Heart:  Regular rate and rhythm; no murmurs, clicks, rubs, or gallops. Abdomen:  Normal bowel sounds. Soft, mild epigastric tenderness and moderately distended, tympanic to percussion without masses, hepatosplenomegaly or hernias noted.  No guarding or rebound tenderness.   Rectal: Not performed Msk:  Symmetrical without gross deformities. Good, equal movement & strength bilaterally. Pulses:  Normal pulses noted. Extremities:  No clubbing or edema.  No cyanosis. Neurologic:  Alert and oriented x3;  grossly normal neurologically. Skin:  Intact without significant lesions or rashes. No jaundice. Psych:  Alert and cooperative. Normal mood and affect.  Imaging Studies: No recent abdominal imaging  Assessment and Plan:   SHEKINAH PITONES is a 55 y.o. pleasant female with history of pulmonary sarcoidosis, hypertension is seen in consultation for 3 months history of nonbloody diarrhea associated with abdominal bloating and cramps as well as history of B12 and iron deficiency anemia.    Chronic diarrhea: It has resolved, however stools are mushy and foul-smelling Abdominal bloating and upper abdominal pain GI profile PCR is negative EGD revealed mild gastric erosions, with gastric biopsies revealed acute gastritis, no evidence of H. pylori and duodenal biopsies are negative,  colonoscopy revealed normal TI and colon  Check H. pylori IgG and treat if positive Check pancreatic fecal elastase levels If above work-up is negative, recommend 2 weeks course of rifaximin or metronidazole to empirically treat small intestine bacterial overgrowth If her symptoms are persistent, recommend CT abdomen and pelvis to evaluate for upper abdominal pain  History of iron and B12 deficiency anemia: Currently resolved EGD and colonoscopy are unremarkable Continue iron and B12 supplements    Follow up in 2 to 3 months   Brittany Repress, MD

## 2020-10-01 ENCOUNTER — Ambulatory Visit: Payer: 59 | Admitting: Hospice and Palliative Medicine

## 2020-10-01 ENCOUNTER — Encounter: Payer: Self-pay | Admitting: Hospice and Palliative Medicine

## 2020-10-01 VITALS — BP 133/83 | HR 94 | Temp 97.5°F | Resp 16 | Ht 66.5 in | Wt 166.4 lb

## 2020-10-01 DIAGNOSIS — F3341 Major depressive disorder, recurrent, in partial remission: Secondary | ICD-10-CM

## 2020-10-01 DIAGNOSIS — I10 Essential (primary) hypertension: Secondary | ICD-10-CM | POA: Diagnosis not present

## 2020-10-01 DIAGNOSIS — R0602 Shortness of breath: Secondary | ICD-10-CM | POA: Diagnosis not present

## 2020-10-01 DIAGNOSIS — Z8616 Personal history of COVID-19: Secondary | ICD-10-CM | POA: Diagnosis not present

## 2020-10-01 DIAGNOSIS — G4700 Insomnia, unspecified: Secondary | ICD-10-CM | POA: Diagnosis not present

## 2020-10-01 DIAGNOSIS — D860 Sarcoidosis of lung: Secondary | ICD-10-CM | POA: Diagnosis not present

## 2020-10-01 DIAGNOSIS — M2550 Pain in unspecified joint: Secondary | ICD-10-CM | POA: Diagnosis not present

## 2020-10-01 DIAGNOSIS — F339 Major depressive disorder, recurrent, unspecified: Secondary | ICD-10-CM

## 2020-10-01 DIAGNOSIS — K219 Gastro-esophageal reflux disease without esophagitis: Secondary | ICD-10-CM | POA: Diagnosis not present

## 2020-10-01 DIAGNOSIS — R69 Illness, unspecified: Secondary | ICD-10-CM | POA: Diagnosis not present

## 2020-10-01 LAB — URINE CULTURE: Culture: 40000 — AB

## 2020-10-01 MED ORDER — DICYCLOMINE HCL 10 MG PO CAPS
ORAL_CAPSULE | ORAL | 0 refills | Status: DC
Start: 2020-10-01 — End: 2020-12-30

## 2020-10-01 MED ORDER — ALBUTEROL SULFATE (2.5 MG/3ML) 0.083% IN NEBU: 2.5000 mg | INHALATION_SOLUTION | Freq: Four times a day (QID) | RESPIRATORY_TRACT | 1 refills | Status: DC | PRN

## 2020-10-01 MED ORDER — ZOLPIDEM TARTRATE 10 MG PO TABS: 10.0000 mg | ORAL_TABLET | Freq: Every evening | ORAL | 1 refills | Status: DC | PRN

## 2020-10-01 MED ORDER — PREDNISONE 1 MG PO TABS: 2.0000 mg | ORAL_TABLET | Freq: Every day | ORAL | 2 refills | Status: DC

## 2020-10-01 MED ORDER — CELECOXIB 200 MG PO CAPS: 200.0000 mg | ORAL_CAPSULE | Freq: Every day | ORAL | 3 refills | Status: DC

## 2020-10-01 MED ORDER — BISOPROLOL-HYDROCHLOROTHIAZIDE 2.5-6.25 MG PO TABS
ORAL_TABLET | ORAL | 6 refills | Status: DC
Start: 2020-10-01 — End: 2020-11-17

## 2020-10-01 MED ORDER — MIRTAZAPINE 30 MG PO TABS: 30.0000 mg | ORAL_TABLET | Freq: Every day | ORAL | 0 refills | Status: DC

## 2020-10-01 MED ORDER — PREDNISONE 10 MG PO TABS: 10.0000 mg | ORAL_TABLET | Freq: Every day | ORAL | 3 refills | Status: DC

## 2020-10-01 MED ORDER — DULOXETINE HCL 30 MG PO CPEP: 30.0000 mg | ORAL_CAPSULE | Freq: Every day | ORAL | 1 refills | Status: DC

## 2020-10-01 NOTE — Progress Notes (Signed)
[facility] [address] [place], [place] [address]  Internal MEDICINE  Office Visit Note  Patient Name: Brittany Huynh  [id number]  [id number]  Date of Service: 10/05/2020  Chief Complaint  Patient presents with  . Follow-up  . Anxiety  . Abdominal Pain  . Shortness of Breath    HPI Patient is here for routine follow-up Recovering from COVID19 infection--history of sarcoidosis Has been wearing supplemental oxygen due to hypoxia with exertion noted from our last visit Today, she says her oxygen levels have returned to normal, no longer requiring supplemental oxygen She continues to be short of breath with exertion and has fatigue and lack of energy throughout the day Has been working on building up her strength--has gone to the store just to walk around for exercise  Has been seen by [facility] in the past for history of sarcoidosis but was not happy with their services--has not had follow-up in several months  History of iron deficiency--followed by hematology, last visit labs were stable, not requiring treatment at that time  Requesting refills on her medication  Inquiring about when she may return to work  Current Medication: Outpatient Encounter Medications as of 10/01/2020  Medication Sig Note  . acetaminophen (TYLENOL) 500 MG tablet Take 500 mg by mouth every 6 (six) hours as needed. 09/29/2020: Can take 1 or 2 at a time  . albuterol (PROVENTIL) (2.5 MG/3ML) 0.083% nebulizer solution Take 3 mLs (2.5 mg total) by nebulization every 6 (six) hours as needed for wheezing or shortness of breath.   Marland Kitchen albuterol (VENTOLIN HFA) 108 (90 Base) MCG/ACT inhaler Inhale 2 puffs into the lungs every 6 (six) hours as needed for wheezing or shortness of breath.   Marland Kitchen ascorbic acid (VITAMIN C) 500 MG tablet Take 1 tablet (500 mg total) by mouth daily.   Marland Kitchen aspirin EC 81 MG EC tablet Take 1 tablet (81 mg total) by mouth daily. Swallow whole.   . bisoprolol-hydrochlorothiazide  (ZIAC) 2.5-6.25 MG tablet Take one tab po qd for HTN   . celecoxib (CELEBREX) 200 MG capsule Take 1 capsule (200 mg total) by mouth daily.   . cholecalciferol (VITAMIN D) 25 MCG tablet Take 1 tablet (1,000 Units total) by mouth daily.   Marland Kitchen dicyclomine (BENTYL) 10 MG capsule TAKE 1 CAPSULE BY MOUTH EVERY DAY   . DULoxetine (CYMBALTA) 30 MG capsule Take 1 capsule (30 mg total) by mouth daily.   . hydroxychloroquine (PLAQUENIL) 200 MG tablet Take 200 mg by mouth 2 (two) times daily.   . mirtazapine (REMERON) 30 MG tablet Take 1 tablet (30 mg total) by mouth at bedtime.   Marland Kitchen omeprazole (PRILOSEC) 40 MG capsule Take 1 capsule (40 mg total) by mouth daily before breakfast.   . predniSONE (DELTASONE) 1 MG tablet Take 2 tablets (2 mg total) by mouth daily with breakfast. Each day with fod and in combination of 10 mg prednisone for a total dose every  day 12 mg   . predniSONE (DELTASONE) 10 MG tablet Take 1 tablet (10 mg total) by mouth daily with breakfast.   . zinc sulfate 220 (50 Zn) MG capsule Take 1 capsule (220 mg total) by mouth daily.   Marland Kitchen zolpidem (AMBIEN) 10 MG tablet Take 1 tablet (10 mg total) by mouth at bedtime as needed for sleep.   . [DISCONTINUED] albuterol (PROVENTIL) (2.5 MG/3ML) 0.083% nebulizer solution Take 3 mLs (2.5 mg total) by nebulization every 6 (six) hours as needed for wheezing or shortness of  breath.   . [DISCONTINUED] benzonatate (TESSALON) 100 MG capsule Take 1 capsule (100 mg total) by mouth 2 (two) times daily as needed for cough.   . [DISCONTINUED] bisoprolol-hydrochlorothiazide (ZIAC) 2.5-6.25 MG tablet Take one tab po qd for HTN   . [DISCONTINUED] celecoxib (CELEBREX) 200 MG capsule Take 1 capsule (200 mg total) by mouth daily.   . [DISCONTINUED] dicyclomine (BENTYL) 10 MG capsule TAKE 1 CAPSULE BY MOUTH EVERY DAY   . [DISCONTINUED] DULoxetine (CYMBALTA) 30 MG capsule Take 1 capsule (30 mg total) by mouth daily.   . [DISCONTINUED] mirtazapine (REMERON) 30 MG tablet Take 1  tablet (30 mg total) by mouth at bedtime.   . [DISCONTINUED] predniSONE (DELTASONE) 1 MG tablet Take 2 mg by mouth daily with breakfast. Each day with fod and in combination of 10 mg prednisone for a total dose every  day 12 mg   . [DISCONTINUED] predniSONE (DELTASONE) 10 MG tablet Take 1 tablet (10 mg total) by mouth daily with breakfast. 09/13/2020: Pt takes 2 tablets with a 10 mg tablet for total dose of 12 mg  . [DISCONTINUED] zolpidem (AMBIEN) 10 MG tablet Take 1 tablet (10 mg total) by mouth at bedtime as needed for sleep.    No facility-administered encounter medications on file as of 10/01/2020.    Surgical History: Past Surgical History:  Procedure Laterality Date  . ABDOMINAL HYSTERECTOMY  2009  . BREAST BIOPSY Left 02/15/2018   PREDOMINANTLY MATURE ADIPOSE TISSUE WITH VERY FOCAL AREAS OF BENIGN   . BREAST EXCISIONAL BIOPSY    . chamberlain procedure   2011  . COLONOSCOPY WITH PROPOFOL N/A 09/02/2020   Procedure: COLONOSCOPY WITH PROPOFOL;  Surgeon: Toney Reil, MD;  Location: [facility] ENDOSCOPY;  Service: Gastroenterology;  Laterality: N/A;  . ESOPHAGOGASTRODUODENOSCOPY (EGD) WITH PROPOFOL N/A 09/02/2020   Procedure: ESOPHAGOGASTRODUODENOSCOPY (EGD) WITH PROPOFOL;  Surgeon: Toney Reil, MD;  Location: [facility] ENDOSCOPY;  Service: Gastroenterology;  Laterality: N/A;  . IRRIGATION AND DEBRIDEMENT HEMATOMA Left 03/07/2018   Procedure: IRRIGATION AND DEBRIDEMENT HEMATOMA-LEFT BREAST;  Surgeon: Earline Mayotte, MD;  Location: [facility] ORS;  Service: General;  Laterality: Left;  . LUNG BIOPSY  2011  . RESECTION RIBS EXTRAPLEURAL  956[address]    Medical History: Past Medical History:  Diagnosis Date  . Anemia   . Anxiety   . Asthma   . Breast fibrocystic disorder 2013  . Hernia 2011   chest wall  . Lung mass 2011  . Sarcoidosis   . Shortness of breath    2011    Family History: Family History  Problem Relation Age of Onset  . Liver cancer Mother 78  . Bone cancer Maternal  Aunt 60  . Breast cancer Maternal Aunt   . Breast cancer Paternal Aunt   . Kidney cancer Father   . Bone cancer Paternal Aunt 64    Social History   Socioeconomic History  . Marital status: Divorced    Spouse name: Not on file  . Number of children: Not on file  . Years of education: Not on file  . Highest education level: Not on file  Occupational History  . Not on file  Tobacco Use  . Smoking status: Never Smoker  . Smokeless tobacco: Never Used  Vaping Use  . Vaping Use: Never used  Substance and Sexual Activity  . Alcohol use: Yes    Comment: on occassion  . Drug use: No  . Sexual activity: Not on file  Other Topics Concern  . Not on  file  Social History Narrative  . Not on file   Social Determinants of Health   Financial Resource Strain: Not on file  Food Insecurity: Not on file  Transportation Needs: Not on file  Physical Activity: Not on file  Stress: Not on file  Social Connections: Not on file  Intimate Partner Violence: Not on file    Review of Systems  Constitutional: Positive for fatigue. Negative for chills and diaphoresis.  HENT: Negative for ear pain, postnasal drip and sinus pressure.   Eyes: Negative for photophobia, discharge, redness, itching and visual disturbance.  Respiratory: Positive for shortness of breath. Negative for cough and wheezing.   Cardiovascular: Negative for chest pain, palpitations and leg swelling.  Gastrointestinal: Negative for abdominal pain, constipation, diarrhea, nausea and vomiting.  Genitourinary: Negative for dysuria and flank pain.  Musculoskeletal: Negative for arthralgias, back pain, gait problem and neck pain.  Skin: Negative for color change.  Allergic/Immunologic: Negative for environmental allergies and food allergies.  Neurological: Negative for dizziness and headaches.  Hematological: Does not bruise/bleed easily.  Psychiatric/Behavioral: Negative for agitation, behavioral problems (depression) and  hallucinations.    Vital Signs: BP 133/83   Pulse 94   Temp (!) 97.5 F (36.4 C)   Resp 16   Ht 5' 6.5" (1.689 m)   Wt 166 lb 6.4 oz (75.5 kg)   SpO2 97%   BMI 26.46 kg/m    Physical Exam Vitals reviewed.  Constitutional:      Appearance: Normal appearance. She is well-developed and normal weight.  Cardiovascular:     Rate and Rhythm: Normal rate and regular rhythm.     Pulses: Normal pulses.     Heart sounds: Normal heart sounds.  Pulmonary:     Effort: Pulmonary effort is normal.     Breath sounds: Normal breath sounds.  Abdominal:     General: Abdomen is flat.  Musculoskeletal:        General: Normal range of motion.     Cervical back: Normal range of motion.  Skin:    General: Skin is warm.  Neurological:     General: No focal deficit present.     Mental Status: She is alert and oriented to person, place, and time. Mental status is at baseline.  Psychiatric:        Mood and Affect: Mood normal.        Behavior: Behavior normal.        Thought Content: Thought content normal.        Judgment: Judgment normal.    Assessment/Plan: 1. Sarcoidosis of lung (HCC) SpO2 remains stable on RA during 6 minute walk--no longer needs supplemental oxygen Requesting refills of albuterol as well as prednisone Will need follow-up with pulmonary - 6 minute walk - albuterol (PROVENTIL) (2.5 MG/3ML) 0.083% nebulizer solution; Take 3 mLs (2.5 mg total) by nebulization every 6 (six) hours as needed for wheezing or shortness of breath.  Dispense: 150 mL; Refill: 1 - predniSONE (DELTASONE) 1 MG tablet; Take 2 tablets (2 mg total) by mouth daily with breakfast. Each day with fod and in combination of 10 mg prednisone for a total dose every  day 12 mg  Dispense: 30 tablet; Refill: 2 - predniSONE (DELTASONE) 10 MG tablet; Take 1 tablet (10 mg total) by mouth daily with breakfast.  Dispense: 30 tablet; Refill: 3  2. Personal history of COVID-19 Symptoms slowly improving Spoke with her  employer and will update FMLA to allow her to return to work part time  for the next month and then reevaluate Shortness of breath improving--if worsens or continues to be an issue consider CT scan Advised to slowly build-up her strength and endurance  3. Essential hypertension, benign BP and HR well controlled today--continue to monitor - bisoprolol-hydrochlorothiazide (ZIAC) 2.5-6.25 MG tablet; Take one tab po qd for HTN  Dispense: 30 tablet; Refill: 6  4. SOB (shortness of breath) - 6 minute walk  5. Diffuse arthralgia Stable--requesting refills - celecoxib (CELEBREX) 200 MG capsule; Take 1 capsule (200 mg total) by mouth daily.  Dispense: 90 capsule; Refill: 3  6. Gastro-esophageal reflux disease without esophagitis Stable, requesting refills Will need to consider PPI therapy - dicyclomine (BENTYL) 10 MG capsule; TAKE 1 CAPSULE BY MOUTH EVERY DAY  Dispense: 90 capsule; Refill: 0  7. Recurrent major depressive disorder, in partial remission (HCC) Stable, requesting refills - DULoxetine (CYMBALTA) 30 MG capsule; Take 1 capsule (30 mg total) by mouth daily.  Dispense: 90 capsule; Refill: 1  8. Insomnia, unspecified type Stable requesting refills--Ambien used as needed for ongoing insomnia if not controlled with Remeron--will need adjustment in therapy - mirtazapine (REMERON) 30 MG tablet; Take 1 tablet (30 mg total) by mouth at bedtime.  Dispense: 90 tablet; Refill: 0 - zolpidem (AMBIEN) 10 MG tablet; Take 1 tablet (10 mg total) by mouth at bedtime as needed for sleep.  Dispense: 30 tablet; Refill: 1  General Counseling: Brittany Huynh verbalizes understanding of the findings of todays visit and agrees with plan of treatment. I have discussed any further diagnostic evaluation that may be needed or ordered today. We also reviewed her medications today. she has been encouraged to call the office with any questions or concerns that should arise related to todays visit.    Orders Placed This  Encounter  Procedures  . 6 minute walk    Meds ordered this encounter  Medications  . albuterol (PROVENTIL) (2.5 MG/3ML) 0.083% nebulizer solution    Sig: Take 3 mLs (2.5 mg total) by nebulization every 6 (six) hours as needed for wheezing or shortness of breath.    Dispense:  150 mL    Refill:  1  . bisoprolol-hydrochlorothiazide (ZIAC) 2.5-6.25 MG tablet    Sig: Take one tab po qd for HTN    Dispense:  30 tablet    Refill:  6  . celecoxib (CELEBREX) 200 MG capsule    Sig: Take 1 capsule (200 mg total) by mouth daily.    Dispense:  90 capsule    Refill:  3  . dicyclomine (BENTYL) 10 MG capsule    Sig: TAKE 1 CAPSULE BY MOUTH EVERY DAY    Dispense:  90 capsule    Refill:  0  . DULoxetine (CYMBALTA) 30 MG capsule    Sig: Take 1 capsule (30 mg total) by mouth daily.    Dispense:  90 capsule    Refill:  1  . mirtazapine (REMERON) 30 MG tablet    Sig: Take 1 tablet (30 mg total) by mouth at bedtime.    Dispense:  90 tablet    Refill:  0  . predniSONE (DELTASONE) 1 MG tablet    Sig: Take 2 tablets (2 mg total) by mouth daily with breakfast. Each day with fod and in combination of 10 mg prednisone for a total dose every  day 12 mg    Dispense:  30 tablet    Refill:  2  . predniSONE (DELTASONE) 10 MG tablet    Sig: Take 1 tablet (10 mg total)  by mouth daily with breakfast.    Dispense:  30 tablet    Refill:  3  . zolpidem (AMBIEN) 10 MG tablet    Sig: Take 1 tablet (10 mg total) by mouth at bedtime as needed for sleep.    Dispense:  30 tablet    Refill:  1    Time spent: 30 Minutes Time spent includes review of chart, medications, test results and follow-up plan with the patient.  This patient was seen by Leeanne Deed AGNP-C in Collaboration with Dr Lyndon Code as a part of collaborative care agreement     Lubertha Basque. Kitiara Hintze AGNP-C Internal medicine

## 2020-10-02 ENCOUNTER — Telehealth: Payer: Self-pay

## 2020-10-02 NOTE — Telephone Encounter (Signed)
Gave [employer] orders for oxygen d.c. and pick up equipment and faxed updated forms for return to work part time. Dellas Guard

## 2020-10-05 ENCOUNTER — Encounter: Payer: Self-pay | Admitting: Hospice and Palliative Medicine

## 2020-10-14 ENCOUNTER — Telehealth: Payer: Self-pay

## 2020-10-14 NOTE — Telephone Encounter (Signed)
Patient picked up work notes and copies of disability papers. 09/08/20---10/04/20 Return part time 10/04/20--11/02/20 Return full time 11/02/20. Brittany Huynh

## 2020-10-20 ENCOUNTER — Encounter: Payer: Self-pay | Admitting: Internal Medicine

## 2020-10-20 ENCOUNTER — Ambulatory Visit: Payer: 59 | Admitting: Internal Medicine

## 2020-10-20 ENCOUNTER — Other Ambulatory Visit: Payer: Self-pay

## 2020-10-20 VITALS — BP 135/82 | HR 80 | Temp 97.3°F | Resp 16 | Ht 66.5 in | Wt 168.2 lb

## 2020-10-20 DIAGNOSIS — Z8616 Personal history of COVID-19: Secondary | ICD-10-CM | POA: Diagnosis not present

## 2020-10-20 DIAGNOSIS — K580 Irritable bowel syndrome with diarrhea: Secondary | ICD-10-CM

## 2020-10-20 DIAGNOSIS — Z7952 Long term (current) use of systemic steroids: Secondary | ICD-10-CM | POA: Diagnosis not present

## 2020-10-20 DIAGNOSIS — M2550 Pain in unspecified joint: Secondary | ICD-10-CM

## 2020-10-20 DIAGNOSIS — D860 Sarcoidosis of lung: Secondary | ICD-10-CM | POA: Diagnosis not present

## 2020-10-20 NOTE — Progress Notes (Signed)
[facility] [address] [place], [place] [address]  Internal MEDICINE  Office Visit Note  Patient Name: Brittany Huynh  [id number]  [id number]  Date of Service: 10/21/2020  Chief Complaint  Patient presents with  . Follow-up    Still gets out of breath walking up and down stairs or long distances, chest hurts when pt is short of breath  . Anemia  . Anxiety  . Asthma    HPI  Pt is here for follow up, slowly recovering from COVID pneumonia, she is not on oxygen therapy anymore. Gradual walking is improving. Pt is also c/o diarrhea, has dx of IBS treated Viberzi however expensive. BP is slightly elevated initially however improved afterwards. C/O vague abdominal discomfort under her ribs. Feels down at times due to her sarcoid. Has been maintained on steroids, asthma is well controlled  Pt has been seen by Rheumatology for ongoing Arthralgia and pain, maintained Plaquenil initiated in December, eye exam for high risk meds and for silent uveitis sarcoidosis   Current Medication: Outpatient Encounter Medications as of 10/20/2020  Medication Sig Note  . acetaminophen (TYLENOL) 500 MG tablet Take 500 mg by mouth every 6 (six) hours as needed. 09/29/2020: Can take 1 or 2 at a time  . albuterol (PROVENTIL) (2.5 MG/3ML) 0.083% nebulizer solution Take 3 mLs (2.5 mg total) by nebulization every 6 (six) hours as needed for wheezing or shortness of breath.   Marland Kitchen albuterol (VENTOLIN HFA) 108 (90 Base) MCG/ACT inhaler Inhale 2 puffs into the lungs every 6 (six) hours as needed for wheezing or shortness of breath.   Marland Kitchen ascorbic acid (VITAMIN C) 500 MG tablet Take 1 tablet (500 mg total) by mouth daily.   Marland Kitchen aspirin EC 81 MG EC tablet Take 1 tablet (81 mg total) by mouth daily. Swallow whole.   . bisoprolol-hydrochlorothiazide (ZIAC) 2.5-6.25 MG tablet Take one tab po qd for HTN   . celecoxib (CELEBREX) 200 MG capsule Take 1 capsule (200 mg total) by mouth daily.   . cholecalciferol (VITAMIN  D) 25 MCG tablet Take 1 tablet (1,000 Units total) by mouth daily.   Marland Kitchen dicyclomine (BENTYL) 10 MG capsule TAKE 1 CAPSULE BY MOUTH EVERY DAY   . DULoxetine (CYMBALTA) 30 MG capsule Take 1 capsule (30 mg total) by mouth daily.   . hydroxychloroquine (PLAQUENIL) 200 MG tablet Take 200 mg by mouth 2 (two) times daily.   . mirtazapine (REMERON) 30 MG tablet Take 1 tablet (30 mg total) by mouth at bedtime.   Marland Kitchen omeprazole (PRILOSEC) 40 MG capsule Take 1 capsule (40 mg total) by mouth daily before breakfast.   . predniSONE (DELTASONE) 1 MG tablet Take 2 tablets (2 mg total) by mouth daily with breakfast. Each day with fod and in combination of 10 mg prednisone for a total dose every  day 12 mg   . predniSONE (DELTASONE) 10 MG tablet Take 1 tablet (10 mg total) by mouth daily with breakfast.   . zinc sulfate 220 (50 Zn) MG capsule Take 1 capsule (220 mg total) by mouth daily.   Marland Kitchen zolpidem (AMBIEN) 10 MG tablet Take 1 tablet (10 mg total) by mouth at bedtime as needed for sleep.    No facility-administered encounter medications on file as of 10/20/2020.    Surgical History: Past Surgical History:  Procedure Laterality Date  . ABDOMINAL HYSTERECTOMY  2009  . BREAST BIOPSY Left 02/15/2018   PREDOMINANTLY MATURE ADIPOSE TISSUE WITH VERY FOCAL AREAS OF BENIGN   .  BREAST EXCISIONAL BIOPSY    . chamberlain procedure   2011  . COLONOSCOPY WITH PROPOFOL N/A 09/02/2020   Procedure: COLONOSCOPY WITH PROPOFOL;  Surgeon: Toney Reil, MD;  Location: [facility] ENDOSCOPY;  Service: Gastroenterology;  Laterality: N/A;  . ESOPHAGOGASTRODUODENOSCOPY (EGD) WITH PROPOFOL N/A 09/02/2020   Procedure: ESOPHAGOGASTRODUODENOSCOPY (EGD) WITH PROPOFOL;  Surgeon: Toney Reil, MD;  Location: [facility] ENDOSCOPY;  Service: Gastroenterology;  Laterality: N/A;  . IRRIGATION AND DEBRIDEMENT HEMATOMA Left 03/07/2018   Procedure: IRRIGATION AND DEBRIDEMENT HEMATOMA-LEFT BREAST;  Surgeon: Earline Mayotte, MD;  Location: [facility] ORS;   Service: General;  Laterality: Left;  . LUNG BIOPSY  2011  . RESECTION RIBS EXTRAPLEURAL  [id number]    Medical History: Past Medical History:  Diagnosis Date  . Anemia   . Anxiety   . Asthma   . Breast fibrocystic disorder 2013  . Hernia 2011   chest wall  . Lung mass 2011  . Sarcoidosis   . Shortness of breath    2011    Family History: Family History  Problem Relation Age of Onset  . Liver cancer Mother 26  . Bone cancer Maternal Aunt 60  . Breast cancer Maternal Aunt   . Breast cancer Paternal Aunt   . Kidney cancer Father   . Bone cancer Paternal Aunt 75    Social History   Socioeconomic History  . Marital status: Divorced    Spouse name: Not on file  . Number of children: Not on file  . Years of education: Not on file  . Highest education level: Not on file  Occupational History  . Not on file  Tobacco Use  . Smoking status: Never Smoker  . Smokeless tobacco: Never Used  Vaping Use  . Vaping Use: Never used  Substance and Sexual Activity  . Alcohol use: Yes    Comment: on occassion  . Drug use: No  . Sexual activity: Not on file  Other Topics Concern  . Not on file  Social History Narrative  . Not on file   Social Determinants of Health   Financial Resource Strain: Not on file  Food Insecurity: Not on file  Transportation Needs: Not on file  Physical Activity: Not on file  Stress: Not on file  Social Connections: Not on file  Intimate Partner Violence: Not on file      Review of Systems  Constitutional: Negative for chills, diaphoresis and fatigue.  HENT: Negative for ear pain, postnasal drip and sinus pressure.   Eyes: Negative for photophobia, discharge, redness, itching and visual disturbance.  Respiratory: Positive for shortness of breath. Negative for cough and wheezing.   Cardiovascular: Negative for chest pain, palpitations and leg swelling.  Gastrointestinal: Positive for abdominal distention and diarrhea. Negative for abdominal  pain, constipation, nausea and vomiting.  Genitourinary: Negative for dysuria and flank pain.  Musculoskeletal: Negative for arthralgias, back pain, gait problem and neck pain.  Skin: Negative for color change.  Allergic/Immunologic: Negative for environmental allergies and food allergies.  Neurological: Negative for dizziness and headaches.  Hematological: Does not bruise/bleed easily.  Psychiatric/Behavioral: Negative for agitation, behavioral problems (depression) and hallucinations.    Vital Signs: BP 135/82 Comment: 140/96  Pulse 80   Temp (!) 97.3 F (36.3 C)   Resp 16   Ht 5' 6.5" (1.689 m)   Wt 168 lb 3.2 oz (76.3 kg)   SpO2 98%   BMI 26.74 kg/m    Physical Exam Constitutional:      General: She is  not in acute distress.    Appearance: She is well-developed. She is not diaphoretic.  HENT:     Head: Normocephalic and atraumatic.     Mouth/Throat:     Pharynx: No oropharyngeal exudate.  Eyes:     Pupils: Pupils are equal, round, and reactive to light.  Neck:     Thyroid: No thyromegaly.     Vascular: No JVD.     Trachea: No tracheal deviation.  Cardiovascular:     Rate and Rhythm: Normal rate and regular rhythm.     Heart sounds: Normal heart sounds. No murmur heard. No friction rub. No gallop.   Pulmonary:     Effort: Pulmonary effort is normal. No respiratory distress.     Breath sounds: No wheezing or rales.  Chest:     Chest wall: No tenderness.  Abdominal:     General: Bowel sounds are normal.     Palpations: Abdomen is soft.  Musculoskeletal:        General: Normal range of motion.     Cervical back: Normal range of motion and neck supple.  Lymphadenopathy:     Cervical: No cervical adenopathy.  Skin:    General: Skin is warm and dry.  Neurological:     Mental Status: She is alert and oriented to person, place, and time.     Cranial Nerves: No cranial nerve deficit.  Psychiatric:        Behavior: Behavior normal.        Thought Content: Thought  content normal.        Judgment: Judgment normal.      Assessment/Plan: 1. Personal history of COVID-19 Improving gradually. Continue to monitor. Encouraged light exercises   2. Sarcoidosis of lung (HCC)  pulmonary sarcoidosis diagnosed in 2011, steroid-dependent  3. Irritable bowel syndrome with diarrhea dietary changes, try lomotil prn, Viberzi is expensive   4. Long term current use of systemic steroids Discussed about /prednisone goal is to taper off. On steroids since 2011 Long-term side effect discussed including cataracts glaucoma, myopathy, cushingoid  5. Diffuse arthralgia A lot better Normal rheumatoid factor anti-CCP. No synovitis  Continue Plaquenil initiated in December. Baseline eye exam to check for Plaquenil but also for silent uveitis sarcoidosis. On chronic prednisone 10 mg Normal RF/ANA (MAY, SEPT, 2020) Normal CCP (DEc, 2020) Celebrex 200 mg Cymbalta     General Counseling: Gurbani verbalizes understanding of the findings of todays visit and agrees with plan of treatment. I have discussed any further diagnostic evaluation that may be needed or ordered today. We also reviewed her medications today. she has been encouraged to call the office with any questions or concerns that should arise related to todays visit.  Total time spent: 30 Minutes Time spent includes review of chart, medications, test results, and follow up plan with the patient.   [place] Controlled Substance Database was reviewed by me.   Dr Lyndon Code Internal medicine

## 2020-10-21 ENCOUNTER — Encounter: Payer: Self-pay | Admitting: Internal Medicine

## 2020-10-22 ENCOUNTER — Encounter: Payer: Self-pay | Admitting: Internal Medicine

## 2020-10-23 NOTE — Telephone Encounter (Signed)
Please review

## 2020-10-23 NOTE — Telephone Encounter (Signed)
Can u find out how is her pulse

## 2020-10-26 ENCOUNTER — Ambulatory Visit
Admission: RE | Admit: 2020-10-26 | Discharge: 2020-10-26 | Disposition: A | Discharge status: Home / Self Care | Payer: 59 | Attending: Hospice and Palliative Medicine | Admitting: Hospice and Palliative Medicine

## 2020-10-26 ENCOUNTER — Other Ambulatory Visit: Payer: Self-pay

## 2020-10-26 ENCOUNTER — Ambulatory Visit
Admission: RE | Admit: 2020-10-26 | Discharge: 2020-10-26 | Disposition: A | Discharge status: Home / Self Care | Payer: 59 | Source: Ambulatory Visit | Attending: Hospice and Palliative Medicine | Admitting: Hospice and Palliative Medicine

## 2020-10-26 DIAGNOSIS — R059 Cough, unspecified: Secondary | ICD-10-CM | POA: Insufficient documentation

## 2020-10-26 DIAGNOSIS — J189 Pneumonia, unspecified organism: Secondary | ICD-10-CM | POA: Diagnosis not present

## 2020-10-26 DIAGNOSIS — R0602 Shortness of breath: Secondary | ICD-10-CM | POA: Diagnosis not present

## 2020-10-26 DIAGNOSIS — R0789 Other chest pain: Secondary | ICD-10-CM | POA: Diagnosis not present

## 2020-10-27 ENCOUNTER — Ambulatory Visit: Payer: 59 | Admitting: Internal Medicine

## 2020-11-02 ENCOUNTER — Other Ambulatory Visit: Payer: Self-pay

## 2020-11-04 ENCOUNTER — Other Ambulatory Visit: Payer: Self-pay

## 2020-11-04 DIAGNOSIS — D860 Sarcoidosis of lung: Secondary | ICD-10-CM

## 2020-11-04 MED ORDER — PREDNISONE 1 MG PO TABS: 2.0000 mg | ORAL_TABLET | Freq: Every day | ORAL | 2 refills | Status: DC

## 2020-11-09 ENCOUNTER — Inpatient Hospital Stay: Payer: 59 | Attending: Oncology

## 2020-11-09 DIAGNOSIS — Z79899 Other long term (current) drug therapy: Secondary | ICD-10-CM | POA: Diagnosis not present

## 2020-11-09 DIAGNOSIS — E611 Iron deficiency: Secondary | ICD-10-CM | POA: Insufficient documentation

## 2020-11-09 DIAGNOSIS — R5383 Other fatigue: Secondary | ICD-10-CM | POA: Diagnosis not present

## 2020-11-09 DIAGNOSIS — D509 Iron deficiency anemia, unspecified: Secondary | ICD-10-CM

## 2020-11-09 LAB — VITAMIN B12: Vitamin B-12: 1034 pg/mL — ABNORMAL HIGH (ref 180–914)

## 2020-11-09 LAB — CBC WITH DIFFERENTIAL/PLATELET
Abs Immature Granulocytes: 0.07 10*3/uL (ref 0.00–0.07)
Basophils Absolute: 0 10*3/uL (ref 0.0–0.1)
Basophils Relative: 1 %
Eosinophils Absolute: 0 10*3/uL (ref 0.0–0.5)
Eosinophils Relative: 1 %
HCT: 40.9 % (ref 36.0–46.0)
Hemoglobin: 13.1 g/dL (ref 12.0–15.0)
Immature Granulocytes: 1 %
Lymphocytes Relative: 16 %
Lymphs Abs: 1.3 10*3/uL (ref 0.7–4.0)
MCH: 27.3 pg (ref 26.0–34.0)
MCHC: 32 g/dL (ref 30.0–36.0)
MCV: 85.2 fL (ref 80.0–100.0)
Monocytes Absolute: 0.5 10*3/uL (ref 0.1–1.0)
Monocytes Relative: 6 %
Neutro Abs: 6.1 10*3/uL (ref 1.7–7.7)
Neutrophils Relative %: 75 %
Platelets: 218 10*3/uL (ref 150–400)
RBC: 4.8 MIL/uL (ref 3.87–5.11)
RDW: 14.4 % (ref 11.5–15.5)
WBC: 8 10*3/uL (ref 4.0–10.5)
nRBC: 0 % (ref 0.0–0.2)

## 2020-11-09 LAB — IRON AND TIBC
Iron: 48 ug/dL (ref 28–170)
Saturation Ratios: 14 % (ref 10.4–31.8)
TIBC: 353 ug/dL (ref 250–450)
UIBC: 305 ug/dL

## 2020-11-09 LAB — FOLATE: Folate: 15.4 ng/mL (ref >5.9)

## 2020-11-09 LAB — RETICULOCYTES
Immature Retic Fract: 12.3 % (ref 2.3–15.9)
RBC.: 4.87 MIL/uL (ref 3.87–5.11)
Retic Count, Absolute: 84.7 10*3/uL (ref 19.0–186.0)
Retic Ct Pct: 1.7 % (ref 0.4–3.1)

## 2020-11-09 LAB — FERRITIN: Ferritin: 16 ng/mL (ref 11–307)

## 2020-11-09 LAB — TSH: TSH: 0.434 u[IU]/mL (ref 0.350–4.500)

## 2020-11-10 ENCOUNTER — Encounter: Payer: Self-pay | Admitting: Oncology

## 2020-11-10 ENCOUNTER — Inpatient Hospital Stay (HOSPITAL_BASED_OUTPATIENT_CLINIC_OR_DEPARTMENT_OTHER): Payer: 59 | Admitting: Oncology

## 2020-11-10 DIAGNOSIS — D509 Iron deficiency anemia, unspecified: Secondary | ICD-10-CM | POA: Diagnosis not present

## 2020-11-10 NOTE — Progress Notes (Signed)
Pt states that she has pressure when she urinates, she has upper abdomen and very tender right under her breast just touching it. She drinks good, eats good. Her stools are formed but sometimes it hits her to go to bathroom it gives you no notice and loose with some form.

## 2020-11-12 NOTE — Progress Notes (Signed)
I connected with Brittany Huynh on 11/12/20 at  2:45 PM EDT by video enabled telemedicine visit and verified that I am speaking with the correct person using two identifiers.   I discussed the limitations, risks, security and privacy concerns of performing an evaluation and management service by telemedicine and the availability of in-person appointments. I also discussed with the patient that there may be a patient responsible charge related to this service. The patient expressed understanding and agreed to proceed.  Other persons participating in the visit and their role in the encounter:  none  Patient's location:  home Provider's location:  work  Stage manager Complaint: Routine follow-up of iron deficiency anemia  History of present illness: -patient is a 55 year old female who was seen by Dr. [name] for symptoms of diarrhea and early satiety.  She underwent a colonoscopy which was unremarkable.  EGD showed scattered gastric erosions which were biopsied and was negative for H. pylori and celiac disease.  Negative for malignancy.She has a history of iron deficiency anemia and has therefore been referred to [place].  Most recent CBC from 09/14/2020 showed white count of 3.4, H&H of 12.8/39 with an MCV of 82 and a platelet count of 138.  Over the last 6 years her CBC fluctuates between 12-15.  Iron study showed a normal ferritin of 60.  Patient tested positive for Covid in mid January 2022.  Patient presently reports fatigue.  She did require oxygen following her Covid infection but is presently off oxygen.   Interval history patient reports ongoing fatigue.  Denies other complaints at this time   Review of Systems  Constitutional: Positive for malaise/fatigue. Negative for chills, fever and weight loss.  HENT: Negative for congestion, ear discharge and nosebleeds.   Eyes: Negative for blurred vision.  Respiratory: Negative for cough, hemoptysis, sputum production, shortness of breath and wheezing.    Cardiovascular: Negative for chest pain, palpitations, orthopnea and claudication.  Gastrointestinal: Negative for abdominal pain, blood in stool, constipation, diarrhea, heartburn, melena, nausea and vomiting.  Genitourinary: Negative for dysuria, flank pain, frequency, hematuria and urgency.  Musculoskeletal: Negative for back pain, joint pain and myalgias.  Skin: Negative for rash.  Neurological: Negative for dizziness, tingling, focal weakness, seizures, weakness and headaches.  Endo/Heme/Allergies: Does not bruise/bleed easily.  Psychiatric/Behavioral: Negative for depression and suicidal ideas. The patient does not have insomnia.     Allergies  Allergen Reactions  . Onion Anaphylaxis  . Adhesive [Tape] Rash    Past Medical History:  Diagnosis Date  . Anemia   . Anxiety   . Asthma   . Breast fibrocystic disorder 2013  . Hernia 2011   chest wall  . Lung mass 2011  . Sarcoidosis   . Shortness of breath    2011    Past Surgical History:  Procedure Laterality Date  . ABDOMINAL HYSTERECTOMY  2009  . BREAST BIOPSY Left 02/15/2018   PREDOMINANTLY MATURE ADIPOSE TISSUE WITH VERY FOCAL AREAS OF BENIGN   . BREAST EXCISIONAL BIOPSY    . chamberlain procedure   2011  . COLONOSCOPY WITH PROPOFOL N/A 09/02/2020   Procedure: COLONOSCOPY WITH PROPOFOL;  Surgeon: Toney Reil, MD;  Location: [facility] ENDOSCOPY;  Service: Gastroenterology;  Laterality: N/A;  . ESOPHAGOGASTRODUODENOSCOPY (EGD) WITH PROPOFOL N/A 09/02/2020   Procedure: ESOPHAGOGASTRODUODENOSCOPY (EGD) WITH PROPOFOL;  Surgeon: Toney Reil, MD;  Location: [facility] ENDOSCOPY;  Service: Gastroenterology;  Laterality: N/A;  . IRRIGATION AND DEBRIDEMENT HEMATOMA Left 03/07/2018   Procedure: IRRIGATION AND DEBRIDEMENT HEMATOMA-LEFT BREAST;  Surgeon:  Earline Mayotte, MD;  Location: [facility] ORS;  Service: General;  Laterality: Left;  . LUNG BIOPSY  2011  . RESECTION RIBS EXTRAPLEURAL  [address]    Social History    Socioeconomic History  . Marital status: Divorced    Spouse name: Not on file  . Number of children: Not on file  . Years of education: Not on file  . Highest education level: Not on file  Occupational History  . Not on file  Tobacco Use  . Smoking status: Never Smoker  . Smokeless tobacco: Never Used  Vaping Use  . Vaping Use: Never used  Substance and Sexual Activity  . Alcohol use: Yes    Comment: on occassion  . Drug use: No  . Sexual activity: Not on file  Other Topics Concern  . Not on file  Social History Narrative  . Not on file   Social Determinants of Health   Financial Resource Strain: Not on file  Food Insecurity: Not on file  Transportation Needs: Not on file  Physical Activity: Not on file  Stress: Not on file  Social Connections: Not on file  Intimate Partner Violence: Not on file    Family History  Problem Relation Age of Onset  . Liver cancer Mother 60  . Bone cancer Maternal Aunt 60  . Breast cancer Maternal Aunt   . Breast cancer Paternal Aunt   . Kidney cancer Father   . Bone cancer Paternal Aunt 12     Current Outpatient Medications:  .  acetaminophen (TYLENOL) 500 MG tablet, Take 500 mg by mouth every 6 (six) hours as needed., Disp: , Rfl:  .  albuterol (PROVENTIL) (2.5 MG/3ML) 0.083% nebulizer solution, Take 3 mLs (2.5 mg total) by nebulization every 6 (six) hours as needed for wheezing or shortness of breath., Disp: 150 mL, Rfl: 1 .  albuterol (VENTOLIN HFA) 108 (90 Base) MCG/ACT inhaler, Inhale 2 puffs into the lungs every 6 (six) hours as needed for wheezing or shortness of breath., Disp: 18 g, Rfl: 3 .  ascorbic acid (VITAMIN C) 500 MG tablet, Take 1 tablet (500 mg total) by mouth daily., Disp: 90 tablet, Rfl: 0 .  aspirin EC 81 MG EC tablet, Take 1 tablet (81 mg total) by mouth daily. Swallow whole., Disp: 30 tablet, Rfl: 11 .  bisoprolol-hydrochlorothiazide (ZIAC) 2.5-6.25 MG tablet, Take one tab po qd for HTN, Disp: 30 tablet, Rfl:  6 .  celecoxib (CELEBREX) 200 MG capsule, Take 1 capsule (200 mg total) by mouth daily., Disp: 90 capsule, Rfl: 3 .  cholecalciferol (VITAMIN D) 25 MCG tablet, Take 1 tablet (1,000 Units total) by mouth daily., Disp: 90 tablet, Rfl: 0 .  dicyclomine (BENTYL) 10 MG capsule, TAKE 1 CAPSULE BY MOUTH EVERY DAY, Disp: 90 capsule, Rfl: 0 .  DULoxetine (CYMBALTA) 30 MG capsule, Take 1 capsule (30 mg total) by mouth daily., Disp: 90 capsule, Rfl: 1 .  hydroxychloroquine (PLAQUENIL) 200 MG tablet, Take 200 mg by mouth 2 (two) times daily., Disp: , Rfl:  .  mirtazapine (REMERON) 30 MG tablet, Take 1 tablet (30 mg total) by mouth at bedtime., Disp: 90 tablet, Rfl: 0 .  omeprazole (PRILOSEC) 40 MG capsule, Take 1 capsule (40 mg total) by mouth daily before breakfast., Disp: 30 capsule, Rfl: 2 .  predniSONE (DELTASONE) 1 MG tablet, Take 2 tablets (2 mg total) by mouth daily with breakfast. Each day with fod and in combination of 10 mg prednisone for a total dose every  day 12 mg, Disp: 30 tablet, Rfl: 2 .  predniSONE (DELTASONE) 10 MG tablet, Take 1 tablet (10 mg total) by mouth daily with breakfast., Disp: 30 tablet, Rfl: 3 .  zinc sulfate 220 (50 Zn) MG capsule, Take 1 capsule (220 mg total) by mouth daily., Disp: 90 capsule, Rfl: 0 .  zolpidem (AMBIEN) 10 MG tablet, Take 1 tablet (10 mg total) by mouth at bedtime as needed for sleep., Disp: 30 tablet, Rfl: 1  DG Chest 2 View  Result Date: 10/26/2020 CLINICAL DATA:  Short of breath, chest tightness, COVID 16 September 2020 EXAM: CHEST - 2 VIEW COMPARISON:  09/12/2020 FINDINGS: Frontal and lateral views of the chest demonstrate an unremarkable cardiac silhouette. Interval clearing of the bibasilar airspace disease seen previously. No focal consolidation, effusion, or pneumothorax. No acute bony abnormalities. IMPRESSION: 1. Interval clearance of the bibasilar pneumonia seen previously. No acute intrathoracic process. Electronically Signed   By: Sharlet Salina M.D.    On: 10/26/2020 23:56    No images are attached to the encounter.   CMP Latest Ref Rng & Units 09/14/2020  Glucose 70 - 99 mg/dL 354(S)  BUN 6 - 20 mg/dL 14  Creatinine 5.68 - 1.27 mg/dL 5.17  Sodium 001 - 749 mmol/L 141  Potassium 3.5 - 5.1 mmol/L 3.6  Chloride 98 - 111 mmol/L 110  CO2 22 - 32 mmol/L 23  Calcium 8.9 - 10.3 mg/dL 8.1(L)  Total Protein 6.5 - 8.1 g/dL 6.4(L)  Total Bilirubin 0.3 - 1.2 mg/dL 0.4  Alkaline Phos 38 - 126 U/L 54  AST 15 - 41 U/L 25  ALT 0 - 44 U/L 20   CBC Latest Ref Rng & Units 11/09/2020  WBC 4.0 - 10.5 K/uL 8.0  Hemoglobin 12.0 - 15.0 g/dL 44.9  Hematocrit 67.5 - 46.0 % 40.9  Platelets 150 - 400 K/uL 218     Observation/objective: Appears in no acute distress there was a video visit today.  Breathing is nonlabored  Assessment and plan: Patient is a 55 year old female referred for history of iron deficiency  Presently her H&H is 13.1/40.9 and she is not anemic.  However her ferritin levels are low at 16.  Iron studies showed normal TIBC and normal serum iron.  B12 levels are also normal.  We discussed trial of IV iron to see if it would improve her fatigue but given that she is not anemic it may not help her.  She is willing to try oral iron at this time.  We will repeat CBC ferritin and iron studies in 3 in 6 months and I will see her in 6 months.  If fatigue persists can ferritin levels remain low we can consider IV iron down the line  Follow-up instructions: As about  I discussed the assessment and treatment plan with the patient. The patient was provided an opportunity to ask questions and all were answered. The patient agreed with the plan and demonstrated an understanding of the instructions.   The patient was advised to call back or seek an in-person evaluation if the symptoms worsen or if the condition fails to improve as anticipated.    Visit Diagnosis: 1. Iron deficiency anemia, unspecified iron deficiency anemia type     Dr.  Owens Shark, MD, MPH [facility] at [facility] Tel- [phone] 11/12/2020 8:53 AM

## 2020-11-17 ENCOUNTER — Other Ambulatory Visit: Payer: Self-pay

## 2020-11-17 ENCOUNTER — Encounter: Payer: Self-pay | Admitting: Internal Medicine

## 2020-11-17 ENCOUNTER — Ambulatory Visit: Payer: 59 | Admitting: Internal Medicine

## 2020-11-17 VITALS — BP 160/90 | HR 102 | Temp 98.3°F | Resp 16 | Ht 66.0 in | Wt 173.0 lb

## 2020-11-17 DIAGNOSIS — G4700 Insomnia, unspecified: Secondary | ICD-10-CM

## 2020-11-17 DIAGNOSIS — D860 Sarcoidosis of lung: Secondary | ICD-10-CM

## 2020-11-17 DIAGNOSIS — I10 Essential (primary) hypertension: Secondary | ICD-10-CM | POA: Diagnosis not present

## 2020-11-17 DIAGNOSIS — E8809 Other disorders of plasma-protein metabolism, not elsewhere classified: Secondary | ICD-10-CM

## 2020-11-17 DIAGNOSIS — R7309 Other abnormal glucose: Secondary | ICD-10-CM | POA: Diagnosis not present

## 2020-11-17 MED ORDER — ZOLPIDEM TARTRATE 10 MG PO TABS
10.0000 mg | ORAL_TABLET | Freq: Every evening | ORAL | 1 refills | Status: DC | PRN
Start: 2020-11-17 — End: 2021-02-01

## 2020-11-17 MED ORDER — BISOPROLOL-HYDROCHLOROTHIAZIDE 10-6.25 MG PO TABS: ORAL_TABLET | ORAL | 1 refills | Status: DC

## 2020-11-17 NOTE — Progress Notes (Signed)
[facility] [address] [place], [place] [address]  Internal MEDICINE  Office Visit Note  Patient Name: Brittany Huynh  [id number]  [id number]  Date of Service: 12/03/2020  Chief Complaint  Patient presents with  . Hypertension  . Sarcoidosis    Of lung    HPI Pt is here for routine follow up 1. Has been seen for ongoing symptoms of fatigue and sob after COVID pneumonia. Improving  2. BP continues to be elevated, she is taking 3 of bisoprolol ( 7.5 mg)  3. Recent labs did show low albumin and abnormal glucose  4. Does  Have sarcoid and on chronic steroids. Has seen [facility] and was advised steroids taper  5. Depression is under better control however requires Ambien for sleep   6. concerned about her wt gain  7. Has seen Rheumatology and was prescribed Plaquenil  Current Medication: Outpatient Encounter Medications as of 11/17/2020  Medication Sig Note  . acetaminophen (TYLENOL) 500 MG tablet Take 500 mg by mouth every 6 (six) hours as needed. 09/29/2020: Can take 1 or 2 at a time  . albuterol (PROVENTIL) (2.5 MG/3ML) 0.083% nebulizer solution Take 3 mLs (2.5 mg total) by nebulization every 6 (six) hours as needed for wheezing or shortness of breath.   Marland Kitchen albuterol (VENTOLIN HFA) 108 (90 Base) MCG/ACT inhaler Inhale 2 puffs into the lungs every 6 (six) hours as needed for wheezing or shortness of breath.   Marland Kitchen ascorbic acid (VITAMIN C) 500 MG tablet Take 1 tablet (500 mg total) by mouth daily.   Marland Kitchen aspirin EC 81 MG EC tablet Take 1 tablet (81 mg total) by mouth daily. Swallow whole.   . bisoprolol-hydrochlorothiazide (ZIAC) 10-6.25 MG tablet Take one tab po qam for BP   . celecoxib (CELEBREX) 200 MG capsule Take 1 capsule (200 mg total) by mouth daily.   . cholecalciferol (VITAMIN D) 25 MCG tablet Take 1 tablet (1,000 Units total) by mouth daily.   Marland Kitchen dicyclomine (BENTYL) 10 MG capsule TAKE 1 CAPSULE BY MOUTH EVERY DAY   . DULoxetine (CYMBALTA) 30 MG capsule Take 1  capsule (30 mg total) by mouth daily.   . hydroxychloroquine (PLAQUENIL) 200 MG tablet Take 200 mg by mouth 2 (two) times daily.   . mirtazapine (REMERON) 30 MG tablet Take 1 tablet (30 mg total) by mouth at bedtime.   . predniSONE (DELTASONE) 1 MG tablet Take 2 tablets (2 mg total) by mouth daily with breakfast. Each day with fod and in combination of 10 mg prednisone for a total dose every  day 12 mg   . predniSONE (DELTASONE) 10 MG tablet Take 1 tablet (10 mg total) by mouth daily with breakfast.   . zinc sulfate 220 (50 Zn) MG capsule Take 1 capsule (220 mg total) by mouth daily.   . [DISCONTINUED] bisoprolol-hydrochlorothiazide (ZIAC) 2.5-6.25 MG tablet Take one tab po qd for HTN (Patient taking differently: 2 tablets. Take two tabs po qd for HTN)   . [DISCONTINUED] zolpidem (AMBIEN) 10 MG tablet Take 1 tablet (10 mg total) by mouth at bedtime as needed for sleep.   Marland Kitchen omeprazole (PRILOSEC) 40 MG capsule Take 1 capsule (40 mg total) by mouth daily before breakfast.   . zolpidem (AMBIEN) 10 MG tablet Take 1 tablet (10 mg total) by mouth at bedtime as needed for sleep.    No facility-administered encounter medications on file as of 11/17/2020.    Surgical History: Past Surgical History:  Procedure Laterality Date  .  ABDOMINAL HYSTERECTOMY  2009  . BREAST BIOPSY Left 02/15/2018   PREDOMINANTLY MATURE ADIPOSE TISSUE WITH VERY FOCAL AREAS OF BENIGN   . BREAST EXCISIONAL BIOPSY    . chamberlain procedure   2011  . COLONOSCOPY WITH PROPOFOL N/A 09/02/2020   Procedure: COLONOSCOPY WITH PROPOFOL;  Surgeon: Toney Reil, MD;  Location: [facility] ENDOSCOPY;  Service: Gastroenterology;  Laterality: N/A;  . ESOPHAGOGASTRODUODENOSCOPY (EGD) WITH PROPOFOL N/A 09/02/2020   Procedure: ESOPHAGOGASTRODUODENOSCOPY (EGD) WITH PROPOFOL;  Surgeon: Toney Reil, MD;  Location: [facility] ENDOSCOPY;  Service: Gastroenterology;  Laterality: N/A;  . IRRIGATION AND DEBRIDEMENT HEMATOMA Left 03/07/2018   Procedure:  IRRIGATION AND DEBRIDEMENT HEMATOMA-LEFT BREAST;  Surgeon: Earline Mayotte, MD;  Location: [facility] ORS;  Service: General;  Laterality: Left;  . LUNG BIOPSY  2011  . RESECTION RIBS EXTRAPLEURAL  [id number]    Medical History: Past Medical History:  Diagnosis Date  . Anemia   . Anxiety   . Asthma   . Breast fibrocystic disorder 2013  . Hernia 2011   chest wall  . Lung mass 2011  . Sarcoidosis   . Shortness of breath    2011    Family History: Family History  Problem Relation Age of Onset  . Liver cancer Mother 39  . Bone cancer Maternal Aunt 60  . Breast cancer Maternal Aunt   . Breast cancer Paternal Aunt   . Kidney cancer Father   . Bone cancer Paternal Aunt 59    Social History   Socioeconomic History  . Marital status: Divorced    Spouse name: Not on file  . Number of children: Not on file  . Years of education: Not on file  . Highest education level: Not on file  Occupational History  . Not on file  Tobacco Use  . Smoking status: Never Smoker  . Smokeless tobacco: Never Used  Vaping Use  . Vaping Use: Never used  Substance and Sexual Activity  . Alcohol use: Yes    Comment: on occassion  . Drug use: No  . Sexual activity: Not on file  Other Topics Concern  . Not on file  Social History Narrative  . Not on file   Social Determinants of Health   Financial Resource Strain: Not on file  Food Insecurity: Not on file  Transportation Needs: Not on file  Physical Activity: Not on file  Stress: Not on file  Social Connections: Not on file  Intimate Partner Violence: Not on file      Review of Systems  Constitutional: Positive for fatigue. Negative for chills, diaphoresis and fever.  HENT: Negative for congestion, ear pain, mouth sores, postnasal drip and sinus pressure.   Eyes: Negative for photophobia, discharge, redness, itching and visual disturbance.  Respiratory: Positive for shortness of breath. Negative for cough and wheezing.   Cardiovascular:  Negative for chest pain, palpitations and leg swelling.  Gastrointestinal: Negative for abdominal pain, constipation, diarrhea, nausea and vomiting.  Genitourinary: Negative for dysuria and flank pain.  Musculoskeletal: Positive for arthralgias. Negative for back pain, gait problem and neck pain.  Skin: Negative for color change.  Allergic/Immunologic: Negative for environmental allergies and food allergies.  Neurological: Negative for dizziness and headaches.  Hematological: Does not bruise/bleed easily.  Psychiatric/Behavioral: Negative.  Negative for agitation, behavioral problems (depression) and hallucinations.    Vital Signs: BP (!) 160/90   Pulse (!) 102   Temp 98.3 F (36.8 C)   Resp 16   Ht 5\' 6"  (1.676 m)  Wt 173 lb (78.5 kg)   SpO2 97%   BMI 27.92 kg/m    Physical Exam Constitutional:      General: She is not in acute distress.    Appearance: She is well-developed. She is not diaphoretic.  HENT:     Head: Normocephalic and atraumatic.     Mouth/Throat:     Pharynx: No oropharyngeal exudate.  Eyes:     Pupils: Pupils are equal, round, and reactive to light.  Neck:     Thyroid: No thyromegaly.     Vascular: No JVD.     Trachea: No tracheal deviation.  Cardiovascular:     Rate and Rhythm: Normal rate and regular rhythm.     Heart sounds: Normal heart sounds. No murmur heard. No friction rub. No gallop.   Pulmonary:     Effort: Pulmonary effort is normal. No respiratory distress.     Breath sounds: No wheezing or rales.  Chest:     Chest wall: No tenderness.  Abdominal:     General: Bowel sounds are normal.     Palpations: Abdomen is soft.  Musculoskeletal:        General: Normal range of motion.     Cervical back: Normal range of motion and neck supple.  Lymphadenopathy:     Cervical: No cervical adenopathy.  Skin:    General: Skin is warm and dry.  Neurological:     Mental Status: She is alert and oriented to person, place, and time.     Cranial  Nerves: No cranial nerve deficit.  Psychiatric:        Behavior: Behavior normal.        Thought Content: Thought content normal.        Judgment: Judgment normal.      Assessment/Plan: 1. Benign hypertension Since BP continues to be elevated, will increase her Ziac to 10/6.25 mg po qd.might need second agent if has proteinuria, will check microalbuminuria on next visit   2. Insomnia, unspecified type Continues to have insomnia, will refill ambien  - zolpidem (AMBIEN) 10 MG tablet; Take 1 tablet (10 mg total) by mouth at bedtime as needed for sleep.  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 ?? Arthralgias seen by Rheumatology was given plaquenil   4. Abnormal glucose Can be due to chronic steroids, will taper steroids and start Rybelsus on next visit ( will help with wt loss)   5. Hypoalbuminemia Recent labs showed low protien/ albumin, will need further diagnostics   General Counseling: zeena starkel understanding of the findings of todays visit and agrees with plan of treatment. I have discussed any further diagnostic evaluation that may be needed or ordered today. We also reviewed her medications today. she has been encouraged to call the office with any questions or concerns that should arise related to todays visit.  Meds ordered this encounter  Medications  . zolpidem (AMBIEN) 10 MG tablet    Sig: Take 1 tablet (10 mg total) by mouth at bedtime as needed for sleep.    Dispense:  30 tablet    Refill:  1  . bisoprolol-hydrochlorothiazide (ZIAC) 10-6.25 MG tablet    Sig: Take one tab po qam for BP    Dispense:  30 tablet    Refill:  1    Total time spent:35 Minutes Time spent includes review of chart, medications, test results, and follow up plan with the patient.  [place] Controlled Substance Database was reviewed by me.   Dr Lavera Guise Internal medicine

## 2020-12-06 ENCOUNTER — Other Ambulatory Visit: Payer: Self-pay | Admitting: Gastroenterology

## 2020-12-07 ENCOUNTER — Telehealth: Payer: Self-pay

## 2020-12-07 NOTE — Telephone Encounter (Signed)
Spoke to pt and informed her that Dr Welton Flakes said to go down to 10 mg of prednisone one day and 5 mg the next day.  She advised she spoke to pt's pulmonary dr from [facility] and they wanted her to do the changes.  Pt stated she will try it but last time she did that she couldn't hardly walk.  But she advised she will do the change

## 2020-12-07 NOTE — Telephone Encounter (Signed)
-----   Message from Lyndon Code, MD sent at 12/07/2020  2:56 PM EDT ----- Advise pt she needs to go down to 10 mg tab one day and 5 mg the next day until next visit, I spoke with her pulmonologist at [facility] and they want her to do that ----- Message ----- From: Loura Back, CMA Sent: 12/03/2020   3:48 PM EDT To: Lyndon Code, MD  Spoke to pt and she is taking 12 mg daily 1-10mg  and 2-1 mg of prednisone  ----- Message ----- From: Lyndon Code, MD Sent: 12/03/2020  10:23 AM EDT To: Loura Back, CMA  Can u talk to me about her please, deltasone

## 2020-12-08 ENCOUNTER — Other Ambulatory Visit: Payer: Self-pay | Admitting: Gastroenterology

## 2020-12-10 ENCOUNTER — Encounter: Payer: Self-pay | Admitting: Gastroenterology

## 2020-12-10 ENCOUNTER — Ambulatory Visit: Payer: 59 | Admitting: Gastroenterology

## 2020-12-10 ENCOUNTER — Other Ambulatory Visit: Payer: Self-pay | Admitting: Internal Medicine

## 2020-12-10 ENCOUNTER — Other Ambulatory Visit: Payer: Self-pay

## 2020-12-10 VITALS — BP 124/81 | HR 72 | Temp 98.1°F | Ht 66.0 in | Wt 169.1 lb

## 2020-12-10 DIAGNOSIS — R140 Abdominal distension (gaseous): Secondary | ICD-10-CM

## 2020-12-10 NOTE — Progress Notes (Signed)
[name], MD [address]  [address]  [place], [place] [address]  Main: [phone]  Fax: [phone]    Gastroenterology Consultation  Referring Provider:     Lyndon Code, MD Primary Care Physician:  Lyndon Code, MD Primary Gastroenterologist:  Dr. [name] Reason for Consultation:     Chronic diarrhea, abdominal bloating        HPI:   Brittany Huynh is a 55 y.o. female referred by Dr. Welton Flakes, Shannan Harper, MD  for consultation & management of chronic diarrhea.  Patient reports approximately 2 months history of nonbloody watery bowel movements anywhere from [facility] stool scale 5-7, about 5-6 times a day, not necessarily postprandial associated with lower abdominal cramps as well as bloating.  She does report urgency but did not have any fecal incontinence.  Denies any weight loss.  She denies consumption of carbonated beverages or artificial sweeteners regularly.  She is noted to have chronic iron deficiency, ferritin less than 25 and recently mild anemia, hemoglobin 11.9.  Had B12 deficiency in 2019, was receiving B12 injections, until recently.  Her B12 levels were normal in 02/2019.  She does have history of chronic migraine headaches for which she takes Excedrin intermittently.  Patient does have history of sarcoidosis.  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  She is currently on omeprazole 40 mg once a day which does help with her upper GI symptoms partially.  She reports ongoing abdominal bloating and upper abdominal discomfort.  She reports that she is no longer experiencing diarrhea, however her stools are mushy and foul-smelling, on Bristol stool scale 5.  She was seen by Dr. Smith Robert for iron deficiency.  Her most recent ferritin levels are normal.  She is currently taking over-the-counter iron  supplements.  She is also taking over-the-counter B12 supplements.  Her weight is stable.  Patient was recently hospitalized in January secondary to Covid pneumonia, treated with 11 days where and prednisone Patient reports that she had multiple deaths in her family within last 1 year.  She does not have good sleep  Follow-up visit 12/10/2020 Patient has ongoing abdominal bloating associated with discomfort, it is generalized.  She denies any localized abdominal pain.  She does report only intermittent diarrhea.  Her weight is overall stable.  She reports that her bloating is persistent even if she does not eat anything all day.  She does have severe lactose intolerance, and tries to eliminate lactose-containing foods.  She does acknowledge chewing sugar-free gum in order to keep her mouth moist.  She did not get pancreatic fecal elastase levels done.  NSAIDs: Excedrin for migraine headaches  Antiplts/Anticoagulants/Anti thrombotics: None  GI Procedures:  Upper endoscopy 09/02/2020 - Normal duodenal bulb and second portion of the duodenum. Biopsied. - Erosive gastropathy with no bleeding and no stigmata of recent bleeding. Biopsied. - Normal cardia, gastric fundus and gastric body. Biopsied. - Small hiatal hernia. - Esophagogastric landmarks identified. - Widely patent and non-obstructing Schatzki ring.  DIAGNOSIS:  A. DUODENUM; COLD BIOPSY:  - ENTERIC MUCOSA WITH PRESERVED VILLOUS ARCHITECTURE AND NO SIGNIFICANT  HISTOPATHOLOGIC CHANGE.  - NEGATIVE FOR FEATURES OF CELIAC, DYSPLASIA, AND MALIGNANCY.   B. STOMACH; COLD BIOPSY:  - GASTRIC ANTRAL AND OXYNTIC MUCOSA WITH FEATURES OF MILD REACTIVE  GASTROPATHY.  - NEGATIVE FOR H. PYLORI, DYSPLASIA, AND MALIGNANCY.  Colonoscopy 09/02/2020 - Preparation of the colon was fair. - The examined portion of the ileum was normal. - Diverticulosis in the sigmoid colon and in the descending colon. - The entire examined colon is normal. - No specimens  collected. She denies family history of GI malignancy  Past Medical History:  Diagnosis Date  . Anemia   . Anxiety   . Asthma   . Breast fibrocystic disorder 2013  . Hernia 2011   chest wall  . Lung mass 2011  . Sarcoidosis   . Shortness of breath    2011    Past Surgical History:  Procedure Laterality Date  . ABDOMINAL HYSTERECTOMY  2009  . BREAST BIOPSY Left 02/15/2018   PREDOMINANTLY MATURE ADIPOSE TISSUE WITH VERY FOCAL AREAS OF BENIGN   . BREAST EXCISIONAL BIOPSY    . chamberlain procedure   2011  . COLONOSCOPY WITH PROPOFOL N/A 09/02/2020   Procedure: COLONOSCOPY WITH PROPOFOL;  Surgeon: Toney Reil, MD;  Location: [facility] ENDOSCOPY;  Service: Gastroenterology;  Laterality: N/A;  . ESOPHAGOGASTRODUODENOSCOPY (EGD) WITH PROPOFOL N/A 09/02/2020   Procedure: ESOPHAGOGASTRODUODENOSCOPY (EGD) WITH PROPOFOL;  Surgeon: Toney Reil, MD;  Location: [facility] ENDOSCOPY;  Service: Gastroenterology;  Laterality: N/A;  . IRRIGATION AND DEBRIDEMENT HEMATOMA Left 03/07/2018   Procedure: IRRIGATION AND DEBRIDEMENT HEMATOMA-LEFT BREAST;  Surgeon: Earline Mayotte, MD;  Location: [facility] ORS;  Service: General;  Laterality: Left;  . LUNG BIOPSY  2011  . RESECTION RIBS EXTRAPLEURAL  [id number]    Current Outpatient Medications:  .  acetaminophen (TYLENOL) 500 MG tablet, Take 500 mg by mouth every 6 (six) hours as needed., Disp: , Rfl:  .  albuterol (PROVENTIL) (2.5 MG/3ML) 0.083% nebulizer solution, Take 3 mLs (2.5 mg total) by nebulization every 6 (six) hours as needed for wheezing or shortness of breath., Disp: 150 mL, Rfl: 1 .  albuterol (VENTOLIN HFA) 108 (90 Base) MCG/ACT inhaler, Inhale 2 puffs into the lungs every 6 (six) hours as needed for wheezing or shortness of breath., Disp: 18 g, Rfl: 3 .  ascorbic acid (VITAMIN C) 500 MG tablet, Take 1 tablet (500 mg total) by mouth daily., Disp: 90 tablet, Rfl: 0 .  aspirin EC 81 MG EC tablet, Take 1 tablet (81 mg total) by mouth daily. Swallow  whole., Disp: 30 tablet, Rfl: 11 .  bisoprolol-hydrochlorothiazide (ZIAC) 10-6.25 MG tablet, TAKE 1 TABLET BY MOUTH EVERY DAY IN THE MORNING FOR BLOOD PRESSURE, Disp: 30 tablet, Rfl: 1 .  celecoxib (CELEBREX) 200 MG capsule, Take 1 capsule (200 mg total) by mouth daily., Disp: 90 capsule, Rfl: 3 .  dicyclomine (BENTYL) 10 MG capsule, TAKE 1 CAPSULE BY MOUTH EVERY DAY, Disp: 90 capsule, Rfl: 0 .  DULoxetine (CYMBALTA) 30 MG capsule, Take 1 capsule (30 mg total) by mouth daily., Disp: 90 capsule, Rfl: 1 .  hydroxychloroquine (PLAQUENIL) 200 MG tablet, Take 200 mg by mouth 2 (two) times daily., Disp: , Rfl:  .  mirtazapine (REMERON) 30 MG tablet, Take 1 tablet (30 mg total) by mouth at bedtime., Disp: 90 tablet, Rfl: 0 .  predniSONE (DELTASONE) 1 MG tablet, Take 2 tablets (2 mg total) by mouth daily with breakfast. Each day with fod and in combination of 10 mg prednisone for a total dose every  day 12 mg, Disp: 30 tablet, Rfl: 2 .  predniSONE (DELTASONE) 10 MG tablet, Take 1 tablet (10 mg total) by mouth daily with breakfast., Disp: 30 tablet, Rfl: 3 .  zinc sulfate 220 (50 Zn) MG capsule,  Take 1 capsule (220 mg total) by mouth daily., Disp: 90 capsule, Rfl: 0 .  zolpidem (AMBIEN) 10 MG tablet, Take 1 tablet (10 mg total) by mouth at bedtime as needed for sleep., Disp: 30 tablet, Rfl: 1   Family History  Problem Relation Age of Onset  . Liver cancer Mother 23  . Bone cancer Maternal Aunt 60  . Breast cancer Maternal Aunt   . Breast cancer Paternal Aunt   . Kidney cancer Father   . Bone cancer Paternal Aunt 28     Social History   Tobacco Use  . Smoking status: Never Smoker  . Smokeless tobacco: Never Used  Vaping Use  . Vaping Use: Never used  Substance Use Topics  . Alcohol use: Yes    Comment: on occassion  . Drug use: No    Allergies as of 12/10/2020 - Review Complete 12/10/2020  Allergen Reaction Noted  . 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 Adhesive [tape] Rash 07/03/2012     Review of Systems:    All systems reviewed and negative except where noted in HPI.   Physical Exam:  BP 124/81 (BP Location: Left Arm, Patient Position: Sitting, Cuff Size: Normal)   Pulse 72   Temp 98.1 F (36.7 C) (Oral)   Ht  (1.676 m)   Wt 169 lb 2 oz (76.7 kg)   BMI 27.30 kg/m  No LMP recorded. Patient has had a hysterectomy.  General:   Alert,  Well-developed, well-nourished, pleasant and cooperative in NAD Head:  Normocephalic and atraumatic. Eyes:  Sclera clear, no icterus.   Conjunctiva pink. Ears:  Normal auditory acuity. Nose:  No deformity, discharge, or lesions. Mouth:  No deformity or lesions,oropharynx pink & moist. Neck:  Supple; no masses or thyromegaly. Lungs:  Respirations even and unlabored.  Clear throughout to auscultation.   No wheezes, crackles, or rhonchi. No acute distress. Heart:  Regular rate and rhythm; no murmurs, clicks, rubs, or gallops. Abdomen:  Normal bowel sounds. Soft, generalized abdominal distention, moderate, tympanic to percussion without masses, hepatosplenomegaly or hernias noted.  No guarding or rebound tenderness.   Rectal: Not performed Msk:  Symmetrical without gross deformities. Good, equal movement & strength bilaterally. Pulses:  Normal pulses noted. Extremities:  No clubbing or edema.  No cyanosis. Neurologic:  Alert and oriented x3;  grossly normal neurologically. Skin:  Intact without significant lesions or rashes. No jaundice. Psych:  Alert and cooperative. Normal mood and affect.  Imaging Studies: No recent abdominal imaging  Assessment and Plan:   KAYLINE SHEER is a 55 y.o. pleasant female with history of pulmonary sarcoidosis, hypertension is seen in consultation for 3 months history of nonbloody diarrhea associated with abdominal bloating and cramps as well as history of B12 and iron deficiency anemia.    Chronic diarrhea: It has resolved, however stools are mushy and foul-smelling Abdominal bloating and  upper abdominal pain GI profile PCR is negative EGD revealed mild gastric erosions, with gastric biopsies revealed acute gastritis, no evidence of H. pylori and duodenal biopsies are negative, colonoscopy revealed normal TI and colon  Check H. pylori IgG and treat if positive Check pancreatic fecal elastase levels Also, have discussed regarding trial of probiotics containing Saccharomyces, lactobacillus and Bifidobacterium Empiric trial of pancreatic enzymes, samples provided Completely eliminate chewing gum If above work-up is negative, recommend 2 weeks course of rifaximin or metronidazole to empirically treat small intestine bacterial overgrowth If her symptoms are persistent, recommend CT abdomen and pelvis to evaluate for upper abdominal  pain  History of iron and B12 deficiency anemia: Currently resolved EGD and colonoscopy are unremarkable Patient is not taking iron supplements, fusion samples provided since her ferritin levels are low    Follow up in 2 to 3 months   [name], MD

## 2020-12-10 NOTE — Patient Instructions (Addendum)
Gave Creon samples and Fusion plus samples.  Creon: Take 2 tablets with the first bite of each meal and 1 tablet with the first bite of each snack

## 2020-12-12 LAB — H. PYLORI ANTIBODY, IGG: H. pylori, IgG AbS: 0.18 Index Value (ref 0.00–0.79)

## 2020-12-16 ENCOUNTER — Other Ambulatory Visit: Payer: Self-pay

## 2020-12-16 ENCOUNTER — Other Ambulatory Visit: Payer: Self-pay | Admitting: Gastroenterology

## 2020-12-16 ENCOUNTER — Encounter: Payer: Self-pay | Admitting: Hospice and Palliative Medicine

## 2020-12-16 ENCOUNTER — Ambulatory Visit: Payer: Managed Care, Other (non HMO) | Admitting: Hospice and Palliative Medicine

## 2020-12-16 VITALS — BP 136/74 | HR 64 | Temp 98.0°F | Resp 16 | Ht 66.0 in | Wt 171.2 lb

## 2020-12-16 DIAGNOSIS — R5383 Other fatigue: Secondary | ICD-10-CM

## 2020-12-16 DIAGNOSIS — R630 Anorexia: Secondary | ICD-10-CM

## 2020-12-16 DIAGNOSIS — D860 Sarcoidosis of lung: Secondary | ICD-10-CM

## 2020-12-16 DIAGNOSIS — R140 Abdominal distension (gaseous): Secondary | ICD-10-CM

## 2020-12-16 DIAGNOSIS — I10 Essential (primary) hypertension: Secondary | ICD-10-CM

## 2020-12-16 DIAGNOSIS — K529 Noninfective gastroenteritis and colitis, unspecified: Secondary | ICD-10-CM | POA: Diagnosis not present

## 2020-12-16 NOTE — Progress Notes (Signed)
[facility] [address] [place], [place] [address]  Internal MEDICINE  Office Visit Note  Patient Name: Brittany Huynh  [id number]  [id number]  Date of Service: 12/21/2020  Chief Complaint  Patient presents with  . Asthma  . Anxiety  . Anemia  . Follow-up  . Hypertension  . Sarcoidosis    Of the lungs    HPI Patient is here for routine follow-up Reviewed how she has been taking her prednisone Known history of sarcoidosis, has been seen by [facility] pulmonology in the past--told to start weaning down her use of prednisone She is currently only taking 5 mg of prednisone daily  Had a fall last Wednesday--seen by urgent care, xray's completed, was told she spraied her ankle Felt that her "legs just gave out" and weakness Recent vitamin b12 and iron studies normal  Over the last few weeks has noticed a decline in her appetite, only able to eat about one meal per day but feels that she forces herself to eat that one meal Has been seen by GI whom gave her samples of Creon to try and advised to avoid chewing gum Has been hesitant to try Creon as she has read the potential side effects associated with medication Continues to have frequent diarrhea EGD--mild gastric erosions, negative biopsies, H. Pylori negative Normal colonoscopy Recommended possible CT abdomen if symptoms persisted   Current Medication: Outpatient Encounter Medications as of 12/16/2020  Medication Sig Note  . acetaminophen (TYLENOL) 500 MG tablet Take 500 mg by mouth every 6 (six) hours as needed. 09/29/2020: Can take 1 or 2 at a time  . albuterol (PROVENTIL) (2.5 MG/3ML) 0.083% nebulizer solution Take 3 mLs (2.5 mg total) by nebulization every 6 (six) hours as needed for wheezing or shortness of breath.   Marland Kitchen albuterol (VENTOLIN HFA) 108 (90 Base) MCG/ACT inhaler Inhale 2 puffs into the lungs every 6 (six) hours as needed for wheezing or shortness of breath.   Marland Kitchen ascorbic acid (VITAMIN C) 500 MG tablet Take  1 tablet (500 mg total) by mouth daily.   Marland Kitchen aspirin EC 81 MG EC tablet Take 1 tablet (81 mg total) by mouth daily. Swallow whole.   . bisoprolol-hydrochlorothiazide (ZIAC) 10-6.25 MG tablet TAKE 1 TABLET BY MOUTH EVERY DAY IN THE MORNING FOR BLOOD PRESSURE   . celecoxib (CELEBREX) 200 MG capsule Take 1 capsule (200 mg total) by mouth daily.   Marland Kitchen dicyclomine (BENTYL) 10 MG capsule TAKE 1 CAPSULE BY MOUTH EVERY DAY   . DULoxetine (CYMBALTA) 30 MG capsule Take 1 capsule (30 mg total) by mouth daily.   . hydroxychloroquine (PLAQUENIL) 200 MG tablet Take 200 mg by mouth 2 (two) times daily.   . mirtazapine (REMERON) 30 MG tablet Take 1 tablet (30 mg total) by mouth at bedtime.   . predniSONE (DELTASONE) 1 MG tablet Take 2 tablets (2 mg total) by mouth daily with breakfast. Each day with fod and in combination of 10 mg prednisone for a total dose every  day 12 mg   . predniSONE (DELTASONE) 10 MG tablet Take 1 tablet (10 mg total) by mouth daily with breakfast.   . zinc sulfate 220 (50 Zn) MG capsule Take 1 capsule (220 mg total) by mouth daily.   Marland Kitchen zolpidem (AMBIEN) 10 MG tablet Take 1 tablet (10 mg total) by mouth at bedtime as needed for sleep.    No facility-administered encounter medications on file as of 12/16/2020.    Surgical History: Past Surgical History:  Procedure Laterality Date  . ABDOMINAL HYSTERECTOMY  2009  . BREAST BIOPSY Left 02/15/2018   PREDOMINANTLY MATURE ADIPOSE TISSUE WITH VERY FOCAL AREAS OF BENIGN   . BREAST EXCISIONAL BIOPSY    . chamberlain procedure   2011  . COLONOSCOPY WITH PROPOFOL N/A 09/02/2020   Procedure: COLONOSCOPY WITH PROPOFOL;  Surgeon: Toney Reil, MD;  Location: [facility] ENDOSCOPY;  Service: Gastroenterology;  Laterality: N/A;  . ESOPHAGOGASTRODUODENOSCOPY (EGD) WITH PROPOFOL N/A 09/02/2020   Procedure: ESOPHAGOGASTRODUODENOSCOPY (EGD) WITH PROPOFOL;  Surgeon: Toney Reil, MD;  Location: [facility] ENDOSCOPY;  Service: Gastroenterology;  Laterality:  N/A;  . IRRIGATION AND DEBRIDEMENT HEMATOMA Left 03/07/2018   Procedure: IRRIGATION AND DEBRIDEMENT HEMATOMA-LEFT BREAST;  Surgeon: Earline Mayotte, MD;  Location: [facility] ORS;  Service: General;  Laterality: Left;  . LUNG BIOPSY  2011  . RESECTION RIBS EXTRAPLEURAL  [id number]    Medical History: Past Medical History:  Diagnosis Date  . Anemia   . Anxiety   . Asthma   . Breast fibrocystic disorder 2013  . Edema 09/15/2017  . Hernia 2011   chest wall  . Iron deficiency anemia 09/15/2017  . Lung mass 2011  . Sarcoidosis   . Shortness of breath    2011  . Tachycardia, unspecified 09/15/2017    Family History: Family History  Problem Relation Age of Onset  . Liver cancer Mother 58  . Bone cancer Maternal Aunt 60  . Breast cancer Maternal Aunt   . Breast cancer Paternal Aunt   . Kidney cancer Father   . Bone cancer Paternal Aunt 60    Social History   Socioeconomic History  . Marital status: Divorced    Spouse name: Not on file  . Number of children: Not on file  . Years of education: Not on file  . Highest education level: Not on file  Occupational History  . Not on file  Tobacco Use  . Smoking status: Never Smoker  . Smokeless tobacco: Never Used  Vaping Use  . Vaping Use: Never used  Substance and Sexual Activity  . Alcohol use: Yes    Comment: on occassion  . Drug use: No  . Sexual activity: Not on file  Other Topics Concern  . Not on file  Social History Narrative  . Not on file   Social Determinants of Health   Financial Resource Strain: Not on file  Food Insecurity: Not on file  Transportation Needs: Not on file  Physical Activity: Not on file  Stress: Not on file  Social Connections: Not on file  Intimate Partner Violence: Not on file      Review of Systems  Constitutional: Negative for chills, diaphoresis and fatigue.  HENT: Negative for ear pain, postnasal drip and sinus pressure.   Eyes: Negative for photophobia, discharge, redness, itching  and visual disturbance.  Respiratory: Negative for cough, shortness of breath and wheezing.   Cardiovascular: Negative for chest pain, palpitations and leg swelling.  Gastrointestinal: Positive for diarrhea. Negative for abdominal pain, constipation, nausea and vomiting.       Lack of appetite  Genitourinary: Negative for dysuria and flank pain.  Musculoskeletal: Negative for arthralgias, back pain, gait problem and neck pain.  Skin: Negative for color change.  Allergic/Immunologic: Negative for environmental allergies and food allergies.  Neurological: Positive for weakness. Negative for dizziness and headaches.  Hematological: Does not bruise/bleed easily.  Psychiatric/Behavioral: Negative for agitation, behavioral problems (depression) and hallucinations.    Vital Signs: BP 136/74   Pulse 64  Temp 98 F (36.7 C)   Resp 16   Ht 5\' 6"  (1.676 m)   Wt 171 lb 3.2 oz (77.7 kg)   SpO2 99%   BMI 27.63 kg/m    Physical Exam Vitals reviewed.  Constitutional:      Appearance: Normal appearance. She is normal weight.  Cardiovascular:     Rate and Rhythm: Normal rate and regular rhythm.     Pulses: Normal pulses.     Heart sounds: Normal heart sounds.  Pulmonary:     Effort: Pulmonary effort is normal.     Breath sounds: Normal breath sounds.  Abdominal:     General: Abdomen is flat.     Palpations: Abdomen is soft.  Musculoskeletal:        General: Normal range of motion.     Cervical back: Normal range of motion.  Skin:    General: Skin is warm.  Neurological:     General: No focal deficit present.     Mental Status: She is alert and oriented to person, place, and time. Mental status is at baseline.  Psychiatric:        Mood and Affect: Mood normal.        Thought Content: Thought content normal.        Judgment: Judgment normal.     Assessment/Plan: 1. Abdominal distension (gaseous) Will review CT results and adjust therapy as indicated Has followed  recommendations from GI without symptom improvement - CT Abdomen Pelvis Wo Contrast; Future  2. Loss of appetite Will review CT results and adjust therapy as indicated Has followed recommendations from GI without symptom improvement - CT Abdomen Pelvis Wo Contrast; Future  3. Chronic diarrhea Will review CT results and adjust therapy as indicated Has followed recommendations from GI without symptom improvement - CT Abdomen Pelvis Wo Contrast; Future  4. Sarcoidosis of lung (HCC) Continue with prednisone taper as recommended, will need pulmonary follow-up  5. Benign hypertension BP and HR remain well controlled on present management, continue to monitor  6. Other fatigue - CBC w/Diff/Platelet - Comprehensive Metabolic Panel (CMET) - Lipid Panel With LDL/HDL Ratio - TSH + free T4  General Counseling: Zaylie verbalizes understanding of the findings of todays visit and agrees with plan of treatment. I have discussed any further diagnostic evaluation that may be needed or ordered today. We also reviewed her medications today. she has been encouraged to call the office with any questions or concerns that should arise related to todays visit.    Orders Placed This Encounter  Procedures  . CT Abdomen Pelvis Wo Contrast  . CBC w/Diff/Platelet  . Comprehensive Metabolic Panel (CMET)  . Lipid Panel With LDL/HDL Ratio  . TSH + free T4    Time spent:30 Minutes Time spent includes review of chart, medications, test results and follow-up plan with the patient.  This patient was seen by Olegario Messier AGNP-C in Collaboration with Dr Leeanne Deed as a part of collaborative care agreement     Lyndon Code. Khup Sapia AGNP-C Internal medicine

## 2020-12-21 ENCOUNTER — Encounter: Payer: Self-pay | Admitting: Hospice and Palliative Medicine

## 2020-12-21 ENCOUNTER — Encounter: Payer: Self-pay | Admitting: Gastroenterology

## 2020-12-21 LAB — PANCREATIC ELASTASE, FECAL: Pancreatic Elastase, Fecal: 361 ug Elast./g (ref >200)

## 2020-12-22 ENCOUNTER — Telehealth: Payer: Self-pay

## 2020-12-22 MED ORDER — RIFAXIMIN 550 MG PO TABS: 550.0000 mg | ORAL_TABLET | Freq: Two times a day (BID) | ORAL | 0 refills | Status: AC

## 2020-12-22 NOTE — Telephone Encounter (Signed)
Sent medication to [facility]. 

## 2020-12-22 NOTE — Telephone Encounter (Signed)
Patient advised of CT scheduled at [facility] on 01-06-21 at 8:00a.

## 2020-12-24 ENCOUNTER — Other Ambulatory Visit: Payer: Self-pay | Admitting: Internal Medicine

## 2020-12-30 ENCOUNTER — Other Ambulatory Visit: Payer: Self-pay | Admitting: Hospice and Palliative Medicine

## 2020-12-30 DIAGNOSIS — F3341 Major depressive disorder, recurrent, in partial remission: Secondary | ICD-10-CM

## 2020-12-30 DIAGNOSIS — K219 Gastro-esophageal reflux disease without esophagitis: Secondary | ICD-10-CM

## 2020-12-30 DIAGNOSIS — G4700 Insomnia, unspecified: Secondary | ICD-10-CM

## 2021-01-04 ENCOUNTER — Telehealth: Payer: Self-pay

## 2021-01-04 ENCOUNTER — Other Ambulatory Visit: Payer: Self-pay | Admitting: Hospice and Palliative Medicine

## 2021-01-04 DIAGNOSIS — D860 Sarcoidosis of lung: Secondary | ICD-10-CM

## 2021-01-04 NOTE — Telephone Encounter (Signed)
Placed prescription for labs in envelope up front for pt to pick up to have labs done at hospital

## 2021-01-05 NOTE — Telephone Encounter (Signed)
LMOM for pt to call back on pred rx.  Pt needs to f-up pulmonary

## 2021-01-06 ENCOUNTER — Other Ambulatory Visit
Admission: RE | Admit: 2021-01-06 | Discharge: 2021-01-06 | Disposition: A | Discharge status: Home / Self Care | Payer: Managed Care, Other (non HMO) | Source: Home / Self Care | Attending: Internal Medicine | Admitting: Internal Medicine

## 2021-01-06 ENCOUNTER — Telehealth: Payer: Self-pay

## 2021-01-06 ENCOUNTER — Other Ambulatory Visit: Payer: Self-pay

## 2021-01-06 ENCOUNTER — Ambulatory Visit
Admission: RE | Admit: 2021-01-06 | Discharge: 2021-01-06 | Disposition: A | Discharge status: Home / Self Care | Payer: Managed Care, Other (non HMO) | Source: Ambulatory Visit | Attending: Hospice and Palliative Medicine | Admitting: Hospice and Palliative Medicine

## 2021-01-06 DIAGNOSIS — R140 Abdominal distension (gaseous): Secondary | ICD-10-CM | POA: Diagnosis present

## 2021-01-06 DIAGNOSIS — E079 Disorder of thyroid, unspecified: Secondary | ICD-10-CM | POA: Insufficient documentation

## 2021-01-06 DIAGNOSIS — R630 Anorexia: Secondary | ICD-10-CM

## 2021-01-06 DIAGNOSIS — R6889 Other general symptoms and signs: Secondary | ICD-10-CM | POA: Insufficient documentation

## 2021-01-06 DIAGNOSIS — E756 Lipid storage disorder, unspecified: Secondary | ICD-10-CM | POA: Insufficient documentation

## 2021-01-06 DIAGNOSIS — K529 Noninfective gastroenteritis and colitis, unspecified: Secondary | ICD-10-CM | POA: Diagnosis present

## 2021-01-06 DIAGNOSIS — R5383 Other fatigue: Secondary | ICD-10-CM | POA: Insufficient documentation

## 2021-01-06 DIAGNOSIS — E0781 Sick-euthyroid syndrome: Secondary | ICD-10-CM | POA: Insufficient documentation

## 2021-01-06 LAB — CBC WITH DIFFERENTIAL/PLATELET
Abs Immature Granulocytes: 0.01 10*3/uL (ref 0.00–0.07)
Basophils Absolute: 0.1 10*3/uL (ref 0.0–0.1)
Basophils Relative: 1 %
Eosinophils Absolute: 0.5 10*3/uL (ref 0.0–0.5)
Eosinophils Relative: 10 %
HCT: 40.7 % (ref 36.0–46.0)
Hemoglobin: 13.1 g/dL (ref 12.0–15.0)
Immature Granulocytes: 0 %
Lymphocytes Relative: 45 %
Lymphs Abs: 2.1 10*3/uL (ref 0.7–4.0)
MCH: 27.6 pg (ref 26.0–34.0)
MCHC: 32.2 g/dL (ref 30.0–36.0)
MCV: 85.7 fL (ref 80.0–100.0)
Monocytes Absolute: 0.5 10*3/uL (ref 0.1–1.0)
Monocytes Relative: 10 %
Neutro Abs: 1.6 10*3/uL — ABNORMAL LOW (ref 1.7–7.7)
Neutrophils Relative %: 34 %
Platelets: 182 10*3/uL (ref 150–400)
RBC: 4.75 MIL/uL (ref 3.87–5.11)
RDW: 13.2 % (ref 11.5–15.5)
WBC: 4.7 10*3/uL (ref 4.0–10.5)
nRBC: 0 % (ref 0.0–0.2)

## 2021-01-06 LAB — COMPREHENSIVE METABOLIC PANEL
ALT: 18 U/L (ref 0–44)
AST: 20 U/L (ref 15–41)
Albumin: 3.6 g/dL (ref 3.5–5.0)
Alkaline Phosphatase: 71 U/L (ref 38–126)
Anion gap: 9 (ref 5–15)
BUN: 13 mg/dL (ref 6–20)
CO2: 28 mmol/L (ref 22–32)
Calcium: 9.3 mg/dL (ref 8.9–10.3)
Chloride: 104 mmol/L (ref 98–111)
Creatinine, Ser: 0.83 mg/dL (ref 0.44–1.00)
GFR, Estimated: >60 mL/min (ref >60)
Glucose, Bld: 86 mg/dL (ref 70–99)
Potassium: 3.2 mmol/L — ABNORMAL LOW (ref 3.5–5.1)
Sodium: 141 mmol/L (ref 135–145)
Total Bilirubin: 0.7 mg/dL (ref 0.3–1.2)
Total Protein: 6.9 g/dL (ref 6.5–8.1)

## 2021-01-06 LAB — LIPID PANEL
Cholesterol: 227 mg/dL — ABNORMAL HIGH (ref 0–200)
HDL: 55 mg/dL (ref >40)
LDL Cholesterol: 142 mg/dL — ABNORMAL HIGH (ref 0–99)
Total CHOL/HDL Ratio: 4.1 RATIO
Triglycerides: 152 mg/dL — ABNORMAL HIGH (ref <150)
VLDL: 30 mg/dL (ref 0–40)

## 2021-01-06 LAB — T4, FREE: Free T4: 0.92 ng/dL (ref 0.61–1.12)

## 2021-01-06 LAB — TSH: TSH: 1.596 u[IU]/mL (ref 0.350–4.500)

## 2021-01-06 NOTE — Telephone Encounter (Signed)
Pt called that she on prednisone 5 mg not on prednisone 1 mg refused pres for prednisone 1 mg

## 2021-01-10 ENCOUNTER — Other Ambulatory Visit: Payer: Self-pay

## 2021-01-10 MED ORDER — POTASSIUM CHLORIDE ER 10 MEQ PO TBCR: EXTENDED_RELEASE_TABLET | ORAL | 3 refills | Status: DC

## 2021-02-01 ENCOUNTER — Ambulatory Visit: Payer: Managed Care, Other (non HMO) | Admitting: Internal Medicine

## 2021-02-01 ENCOUNTER — Other Ambulatory Visit: Payer: Self-pay

## 2021-02-01 ENCOUNTER — Encounter: Payer: Self-pay | Admitting: Internal Medicine

## 2021-02-01 VITALS — BP 140/92 | HR 73 | Temp 98.2°F | Resp 16 | Ht 66.0 in | Wt 164.8 lb

## 2021-02-01 DIAGNOSIS — L309 Dermatitis, unspecified: Secondary | ICD-10-CM | POA: Diagnosis not present

## 2021-02-01 DIAGNOSIS — D860 Sarcoidosis of lung: Secondary | ICD-10-CM

## 2021-02-01 DIAGNOSIS — E782 Mixed hyperlipidemia: Secondary | ICD-10-CM

## 2021-02-01 DIAGNOSIS — M359 Systemic involvement of connective tissue, unspecified: Secondary | ICD-10-CM

## 2021-02-01 DIAGNOSIS — J45909 Unspecified asthma, uncomplicated: Secondary | ICD-10-CM | POA: Diagnosis not present

## 2021-02-01 DIAGNOSIS — I10 Essential (primary) hypertension: Secondary | ICD-10-CM

## 2021-02-01 DIAGNOSIS — G4700 Insomnia, unspecified: Secondary | ICD-10-CM | POA: Diagnosis not present

## 2021-02-01 MED ORDER — PREDNISONE 5 MG PO TABS: 5.0000 mg | ORAL_TABLET | Freq: Every day | ORAL | 2 refills | Status: DC

## 2021-02-01 MED ORDER — BETAMETHASONE DIPROPIONATE AUG 0.05 % EX OINT: TOPICAL_OINTMENT | Freq: Two times a day (BID) | CUTANEOUS | 0 refills | Status: DC

## 2021-02-01 MED ORDER — DOXYCYCLINE HYCLATE 100 MG PO TABS: 100.0000 mg | ORAL_TABLET | Freq: Two times a day (BID) | ORAL | 0 refills | Status: DC

## 2021-02-01 MED ORDER — AMLODIPINE BESYLATE 2.5 MG PO TABS: ORAL_TABLET | ORAL | 1 refills | Status: DC

## 2021-02-01 MED ORDER — FLOVENT HFA 110 MCG/ACT IN AERO: 2.0000 | INHALATION_SPRAY | Freq: Two times a day (BID) | RESPIRATORY_TRACT | 12 refills | Status: DC

## 2021-02-01 MED ORDER — HYDROXYCHLOROQUINE SULFATE 200 MG PO TABS: 200.0000 mg | ORAL_TABLET | Freq: Two times a day (BID) | ORAL | 1 refills | Status: DC

## 2021-02-01 MED ORDER — ZOLPIDEM TARTRATE 10 MG PO TABS: 10.0000 mg | ORAL_TABLET | Freq: Every evening | ORAL | 1 refills | Status: DC | PRN

## 2021-02-01 NOTE — Progress Notes (Signed)
[facility] [address] [place], [place] [address]  Internal MEDICINE  Office Visit Note  Patient Name: Brittany Huynh  [id number]  [id number]  Date of Service: 02/10/2021  Chief Complaint  Patient presents with   Follow-up    Review labs and ct results, pt has a rash on left arm    HPI  Pt is here for routine follow up.  She is complaining of chest congestion coughing wheezing, she is also having greenish sputum production, patient is here to review her labs and CT abdomen results follow-up which has the following findings IMPRESSION: 1.  No acute process in the abdomen or pelvis. 2.  Possible constipation. 3. Small bowel diverticula, without evidence of complicating inflammation. Patient is complaining of dry skin and rash since tapered down dose of prednisone. She is also not seen by rheumatology since her the previous physician retired to be like to see her, sleep continues to be a problem Noted from the previous visit . Has been seen for ongoing symptoms of fatigue and sob after COVID pneumonia. Improving . BP continues to be elevated, patient on bisoprolol 10 mg once a day . Recent labs did show low albumin and abnormal glucose.  Abnormal lipid profile is present . Does  Have sarcoid and on chronic steroids. Has seen [facility] and was advised steroids taper . Depression is under better control however requires Ambien for sleep   . concerned about her wt gain . Has seen Rheumatology and was prescribed Plaquenil    Current Medication: Outpatient Encounter Medications as of 02/01/2021  Medication Sig Note   acetaminophen (TYLENOL) 500 MG tablet Take 500 mg by mouth every 6 (six) hours as needed. 09/29/2020: Can take 1 or 2 at a time   albuterol (PROVENTIL) (2.5 MG/3ML) 0.083% nebulizer solution Take 3 mLs (2.5 mg total) by nebulization every 6 (six) hours as needed for wheezing or shortness of breath.    albuterol (VENTOLIN HFA) 108 (90 Base) MCG/ACT  inhaler Inhale 2 puffs into the lungs every 6 (six) hours as needed for wheezing or shortness of breath.    amLODipine (NORVASC) 2.5 MG tablet Take one tab po qhs for blood pressure    ascorbic acid (VITAMIN C) 500 MG tablet Take 1 tablet (500 mg total) by mouth daily.    aspirin EC 81 MG EC tablet Take 1 tablet (81 mg total) by mouth daily. Swallow whole.    augmented betamethasone dipropionate (DIPROLENE) 0.05 % ointment Apply topically 2 (two) times daily.    bisoprolol-hydrochlorothiazide (ZIAC) 10-6.25 MG tablet TAKE 1 TABLET BY MOUTH EVERY DAY IN THE MORNING FOR BLOOD PRESSURE    celecoxib (CELEBREX) 200 MG capsule Take 1 capsule (200 mg total) by mouth daily.    dicyclomine (BENTYL) 10 MG capsule TAKE 1 CAPSULE BY MOUTH EVERY DAY    doxycycline (VIBRA-TABS) 100 MG tablet Take 1 tablet (100 mg total) by mouth 2 (two) times daily.    DULoxetine (CYMBALTA) 30 MG capsule TAKE 1 CAPSULE BY MOUTH EVERY DAY    fluticasone (FLOVENT HFA) 110 MCG/ACT inhaler Inhale 2 puffs into the lungs 2 (two) times daily.    hydroxychloroquine (PLAQUENIL) 200 MG tablet Take 1 tablet (200 mg total) by mouth 2 (two) times daily.    mirtazapine (REMERON) 30 MG tablet TAKE 1 TABLET BY MOUTH AT BEDTIME.    potassium chloride (KLOR-CON) 10 MEQ tablet Take 1 tablet by mouth on Mon, wed, and Fri    predniSONE (DELTASONE) 5 MG  tablet Take 1 tablet (5 mg total) by mouth daily with breakfast.    zinc sulfate 220 (50 Zn) MG capsule Take 1 capsule (220 mg total) by mouth daily.    [DISCONTINUED] hydroxychloroquine (PLAQUENIL) 200 MG tablet Take 200 mg by mouth 2 (two) times daily.    [DISCONTINUED] predniSONE (DELTASONE) 1 MG tablet Take 2 tablets (2 mg total) by mouth daily with breakfast. Each day with fod and in combination of 10 mg prednisone for a total dose every  day 12 mg    [DISCONTINUED] predniSONE (DELTASONE) 10 MG tablet Take 1 tablet (10 mg total) by mouth daily with breakfast.    zolpidem (AMBIEN) 10 MG tablet  Take 1 tablet (10 mg total) by mouth at bedtime as needed for sleep.    [DISCONTINUED] zolpidem (AMBIEN) 10 MG tablet Take 1 tablet (10 mg total) by mouth at bedtime as needed for sleep.    No facility-administered encounter medications on file as of 02/01/2021.    Surgical History: Past Surgical History:  Procedure Laterality Date   ABDOMINAL HYSTERECTOMY  2009   BREAST BIOPSY Left 02/15/2018   PREDOMINANTLY MATURE ADIPOSE TISSUE WITH VERY FOCAL AREAS OF BENIGN    BREAST EXCISIONAL BIOPSY     chamberlain procedure   2011   COLONOSCOPY WITH PROPOFOL N/A 09/02/2020   Procedure: COLONOSCOPY WITH PROPOFOL;  Surgeon: Toney Reil, MD;  Location: [facility] ENDOSCOPY;  Service: Gastroenterology;  Laterality: N/A;   ESOPHAGOGASTRODUODENOSCOPY (EGD) WITH PROPOFOL N/A 09/02/2020   Procedure: ESOPHAGOGASTRODUODENOSCOPY (EGD) WITH PROPOFOL;  Surgeon: Toney Reil, MD;  Location: [facility] ENDOSCOPY;  Service: Gastroenterology;  Laterality: N/A;   IRRIGATION AND DEBRIDEMENT HEMATOMA Left 03/07/2018   Procedure: IRRIGATION AND DEBRIDEMENT HEMATOMA-LEFT BREAST;  Surgeon: Earline Mayotte, MD;  Location: [facility] ORS;  Service: General;  Laterality: Left;   LUNG BIOPSY  2011   RESECTION RIBS EXTRAPLEURAL  [id number]    Medical History: Past Medical History:  Diagnosis Date   Anemia    Anxiety    Asthma    Breast fibrocystic disorder 2013   Edema 09/15/2017   Hernia 2011   chest wall   Iron deficiency anemia 09/15/2017   Lung mass 2011   Sarcoidosis    Shortness of breath    2011   Tachycardia, unspecified 09/15/2017    Family History: Family History  Problem Relation Age of Onset   Liver cancer Mother 66   Bone cancer Maternal Aunt 60   Breast cancer Maternal Aunt    Breast cancer Paternal Aunt    Kidney cancer Father    Bone cancer Paternal Aunt 59    Social History   Socioeconomic History   Marital status: Divorced    Spouse name: Not on file   Number of children: Not on file   Years  of education: Not on file   Highest education level: Not on file  Occupational History   Not on file  Tobacco Use   Smoking status: Never   Smokeless tobacco: Never  Vaping Use   Vaping Use: Never used  Substance and Sexual Activity   Alcohol use: Yes    Comment: on occassion   Drug use: No   Sexual activity: Not on file  Other Topics Concern   Not on file  Social History Narrative   Not on file   Social Determinants of Health   Financial Resource Strain: Not on file  Food Insecurity: Not on file  Transportation Needs: Not on file  Physical Activity: Not on  file  Stress: Not on file  Social Connections: Not on file  Intimate Partner Violence: Not on file      Review of Systems  Constitutional:  Positive for fatigue. Negative for chills and unexpected weight change.  HENT:  Negative for congestion, rhinorrhea, sneezing and sore throat.   Eyes:  Negative for redness.  Respiratory:  Negative for cough, chest tightness and shortness of breath.   Cardiovascular:  Negative for chest pain and palpitations.  Gastrointestinal:  Negative for abdominal pain, constipation, diarrhea, nausea and vomiting.  Genitourinary:  Negative for dysuria and frequency.  Musculoskeletal:  Positive for arthralgias. Negative for back pain, joint swelling and neck pain.  Skin:  Positive for rash.  Neurological: Negative.  Negative for tremors and numbness.  Hematological:  Negative for adenopathy. Does not bruise/bleed easily.  Psychiatric/Behavioral:  Negative for behavioral problems (Depression), sleep disturbance and suicidal ideas. The patient is not nervous/anxious.    Vital Signs: BP (!) 140/92   Pulse 73   Temp 98.2 F (36.8 C)   Resp 16   Ht 5\' 6"  (1.676 m)   Wt 164 lb 12.8 oz (74.8 kg)   SpO2 96%   BMI 26.60 kg/m    Physical Exam Constitutional:      General: She is not in acute distress.    Appearance: She is well-developed. She is not diaphoretic.  HENT:     Head:  Normocephalic and atraumatic.     Mouth/Throat:     Pharynx: No oropharyngeal exudate.  Eyes:     Pupils: Pupils are equal, round, and reactive to light.  Neck:     Thyroid: No thyromegaly.     Vascular: No JVD.     Trachea: No tracheal deviation.  Cardiovascular:     Rate and Rhythm: Normal rate and regular rhythm.     Heart sounds: Normal heart sounds. No murmur heard.   No friction rub. No gallop.  Pulmonary:     Effort: No respiratory distress.     Breath sounds: Wheezing and rhonchi present. No rales.  Chest:     Chest wall: No tenderness.  Abdominal:     General: Bowel sounds are normal.     Palpations: Abdomen is soft.  Musculoskeletal:        General: Normal range of motion.     Cervical back: Normal range of motion and neck supple.  Lymphadenopathy:     Cervical: No cervical adenopathy.  Skin:    General: Skin is warm and dry.  Neurological:     Mental Status: She is alert and oriented to person, place, and time.     Cranial Nerves: No cranial nerve deficit.  Psychiatric:        Behavior: Behavior normal.        Thought Content: Thought content normal.        Judgment: Judgment normal.       Assessment/Plan: 1. Acute asthmatic bronchitis Patient is seems to be acutely sick and has been having exacerbation of either asthma or sarcoid we will start with Doxy and Flovent and patient will continue to do her prednisone taper as before - doxycycline (VIBRA-TABS) 100 MG tablet; Take 1 tablet (100 mg total) by mouth 2 (two) times daily.  Dispense: 20 tablet; Refill: 0 - fluticasone (FLOVENT HFA) 110 MCG/ACT inhaler; Inhale 2 puffs into the lungs 2 (two) times daily.  Dispense: 3 each; Refill: 12  2. Insomnia, unspecified type Continue Ambien and Remeron as before - zolpidem (AMBIEN) 10  MG tablet; Take 1 tablet (10 mg total) by mouth at bedtime as needed for sleep.  Dispense: 30 tablet; Refill: 1  3. Connective tissue disease, undifferentiated (HCC) Patient will  call her rheumatology office for further management of her connective tissue disease I do not see a specific diagnosis in her chart at this point patient was also instructed to have eye examination every 6 months for surveillance of her Plaquenil - hydroxychloroquine (PLAQUENIL) 200 MG tablet; Take 1 tablet (200 mg total) by mouth 2 (two) times daily.  Dispense: 180 tablet; Refill: 1  4. Eczema, unspecified type Does have eczema type of rash on her hands and elbow creases we will start a low-dose to probably - augmented betamethasone dipropionate (DIPROLENE) 0.05 % ointment; Apply topically 2 (two) times daily.  Dispense: 45 g; Refill: 0  5. Sarcoidosis of lung (HCC) Continue to monitor and taper her prednisone as before might need a CT chest - predniSONE (DELTASONE) 5 MG tablet; Take 1 tablet (5 mg total) by mouth daily with breakfast.  Dispense: 30 tablet; Refill: 2  6. Essential hypertension, benign Patient continues to have elevated blood pressures she is on bisoprolol 10 mg once a day we will add Norvasc 2.5 mg for better blood pressure control - amLODipine (NORVASC) 2.5 MG tablet; Take one tab po qhs for blood pressure  Dispense: 90 tablet; Refill: 1  7. Mixed hyperlipidemia Patient continues to have elevated cholesterol and abnormal lipid profile we will hold on statin at the moment due to excessive fatigue until patient is seen by rheumatology Patient will however change her diet and add some lifestyle modification to her diet  General Counseling: Val Eagle understanding of the findings of todays visit and agrees with plan of treatment. I have discussed any further diagnostic evaluation that may be needed or ordered today. We also reviewed her medications today. she has been encouraged to call the office with any questions or concerns that should arise related to todays visit.    No orders of the defined types were placed in this encounter.   Meds ordered this encounter   Medications   hydroxychloroquine (PLAQUENIL) 200 MG tablet    Sig: Take 1 tablet (200 mg total) by mouth 2 (two) times daily.    Dispense:  180 tablet    Refill:  1   predniSONE (DELTASONE) 5 MG tablet    Sig: Take 1 tablet (5 mg total) by mouth daily with breakfast.    Dispense:  30 tablet    Refill:  2   doxycycline (VIBRA-TABS) 100 MG tablet    Sig: Take 1 tablet (100 mg total) by mouth 2 (two) times daily.    Dispense:  20 tablet    Refill:  0   augmented betamethasone dipropionate (DIPROLENE) 0.05 % ointment    Sig: Apply topically 2 (two) times daily.    Dispense:  45 g    Refill:  0   fluticasone (FLOVENT HFA) 110 MCG/ACT inhaler    Sig: Inhale 2 puffs into the lungs 2 (two) times daily.    Dispense:  3 each    Refill:  12   amLODipine (NORVASC) 2.5 MG tablet    Sig: Take one tab po qhs for blood pressure    Dispense:  90 tablet    Refill:  1   zolpidem (AMBIEN) 10 MG tablet    Sig: Take 1 tablet (10 mg total) by mouth at bedtime as needed for sleep.    Dispense:  30  tablet    Refill:  1    Total time spent:35 Minutes Time spent includes review of chart, medications, test results, and follow up plan with the patient.   [place] Controlled Substance Database was reviewed by me.   Dr Lyndon Code Internal medicine

## 2021-02-19 ENCOUNTER — Inpatient Hospital Stay: Payer: Managed Care, Other (non HMO) | Attending: Oncology

## 2021-03-08 ENCOUNTER — Ambulatory Visit: Payer: Managed Care, Other (non HMO) | Admitting: Nurse Practitioner

## 2021-03-08 ENCOUNTER — Other Ambulatory Visit: Payer: Self-pay

## 2021-03-08 ENCOUNTER — Encounter: Payer: Self-pay | Admitting: Nurse Practitioner

## 2021-03-08 VITALS — BP 133/86 | HR 82 | Temp 98.4°F | Resp 16 | Ht 67.0 in | Wt 165.2 lb

## 2021-03-08 DIAGNOSIS — M359 Systemic involvement of connective tissue, unspecified: Secondary | ICD-10-CM

## 2021-03-08 DIAGNOSIS — D860 Sarcoidosis of lung: Secondary | ICD-10-CM | POA: Diagnosis not present

## 2021-03-08 DIAGNOSIS — E782 Mixed hyperlipidemia: Secondary | ICD-10-CM | POA: Diagnosis not present

## 2021-03-08 DIAGNOSIS — K5792 Diverticulitis of intestine, part unspecified, without perforation or abscess without bleeding: Secondary | ICD-10-CM

## 2021-03-08 DIAGNOSIS — I10 Essential (primary) hypertension: Secondary | ICD-10-CM

## 2021-03-08 MED ORDER — METRONIDAZOLE 500 MG PO TABS: 500.0000 mg | ORAL_TABLET | Freq: Three times a day (TID) | ORAL | 0 refills | Status: DC

## 2021-03-08 MED ORDER — LEVOFLOXACIN 750 MG PO TABS: 750.0000 mg | ORAL_TABLET | Freq: Every day | ORAL | 0 refills | Status: DC

## 2021-03-08 NOTE — Progress Notes (Signed)
[facility] [address] [place], [place] [address]  Internal MEDICINE  Office Visit Note  Patient Name: Brittany Huynh  [id number]  [id number]  Date of Service: 03/08/2021  Chief Complaint  Patient presents with   Follow-up    Med review, stiffness and body still aching, feet burning, both shoulder ache, patient right ankle still swollen and in pain since last fall, patient has diarrhea    Asthma    HPI Brittany Huynh presents for follow up visit for medication review, diarrhea, and asthma. Brittany Huynh was previously see by Dr. Beverely Risen on 02/01/21 and treated for acute asthmatic bronchitis with a course of doxycycline. She states her breathing has improved but she still gets short of breath intermittently. She has a rescue inhaler but does not use it often.  -Loose bms x several months, 4-5 bms per day. Abdominal pain, every time she eats she has to use the bathroom. Pain is localized to lower abdomen. Gassy, bloated, no nausea or vomiting. Pain is random, comes and goes, sharp, 7/10, sometimes small amount of blood in stool, sometimes mucous. Takes a probiotic. -blood pressure is stable at this time.  -She had a recent urgent care visit and had an xray of her ankle, her ankle still hurts.  -has been on prednisone long term for chronic conditions. She is Tapering off of prednisone because it is doing more harm than good. She is having increased stiffness and body aching, shoulders aching. Wants to know what she can take instead of prednisone.  -she has difficulty affording medications.    Current Medication: Outpatient Encounter Medications as of 03/08/2021  Medication Sig Note   acetaminophen (TYLENOL) 500 MG tablet Take 500 mg by mouth every 6 (six) hours as needed. 09/29/2020: Can take 1 or 2 at a time   albuterol (PROVENTIL) (2.5 MG/3ML) 0.083% nebulizer solution Take 3 mLs (2.5 mg total) by nebulization every 6 (six) hours as needed for wheezing or shortness of breath.    albuterol  (VENTOLIN HFA) 108 (90 Base) MCG/ACT inhaler Inhale 2 puffs into the lungs every 6 (six) hours as needed for wheezing or shortness of breath.    amLODipine (NORVASC) 2.5 MG tablet Take one tab po qhs for blood pressure    aspirin EC 81 MG EC tablet Take 1 tablet (81 mg total) by mouth daily. Swallow whole.    augmented betamethasone dipropionate (DIPROLENE) 0.05 % ointment Apply topically 2 (two) times daily.    bisoprolol-hydrochlorothiazide (ZIAC) 10-6.25 MG tablet TAKE 1 TABLET BY MOUTH EVERY DAY IN THE MORNING FOR BLOOD PRESSURE    celecoxib (CELEBREX) 200 MG capsule Take 1 capsule (200 mg total) by mouth daily.    dicyclomine (BENTYL) 10 MG capsule TAKE 1 CAPSULE BY MOUTH EVERY DAY    DULoxetine (CYMBALTA) 30 MG capsule TAKE 1 CAPSULE BY MOUTH EVERY DAY    fluticasone (FLOVENT HFA) 110 MCG/ACT inhaler Inhale 2 puffs into the lungs 2 (two) times daily.    hydroxychloroquine (PLAQUENIL) 200 MG tablet Take 1 tablet (200 mg total) by mouth 2 (two) times daily.    levofloxacin (LEVAQUIN) 750 MG tablet Take 1 tablet (750 mg total) by mouth daily. For 5 days    metroNIDAZOLE (FLAGYL) 500 MG tablet Take 1 tablet (500 mg total) by mouth 3 (three) times daily. For 10 days    mirtazapine (REMERON) 30 MG tablet TAKE 1 TABLET BY MOUTH AT BEDTIME.    predniSONE (DELTASONE) 5 MG tablet Take 1 tablet (5 mg total) by  mouth daily with breakfast.    zolpidem (AMBIEN) 10 MG tablet Take 1 tablet (10 mg total) by mouth at bedtime as needed for sleep.    [DISCONTINUED] doxycycline (VIBRA-TABS) 100 MG tablet Take 1 tablet (100 mg total) by mouth 2 (two) times daily.    [DISCONTINUED] ascorbic acid (VITAMIN C) 500 MG tablet Take 1 tablet (500 mg total) by mouth daily. (Patient not taking: Reported on 03/08/2021)    [DISCONTINUED] potassium chloride (KLOR-CON) 10 MEQ tablet Take 1 tablet by mouth on Mon, wed, and Fri (Patient not taking: Reported on 03/08/2021)    [DISCONTINUED] zinc sulfate 220 (50 Zn) MG capsule Take 1  capsule (220 mg total) by mouth daily. (Patient not taking: Reported on 03/08/2021)    No facility-administered encounter medications on file as of 03/08/2021.    Surgical History: Past Surgical History:  Procedure Laterality Date   ABDOMINAL HYSTERECTOMY  2009   BREAST BIOPSY Left 02/15/2018   PREDOMINANTLY MATURE ADIPOSE TISSUE WITH VERY FOCAL AREAS OF BENIGN    BREAST EXCISIONAL BIOPSY     chamberlain procedure   2011   COLONOSCOPY WITH PROPOFOL N/A 09/02/2020   Procedure: COLONOSCOPY WITH PROPOFOL;  Surgeon: Toney Reil, MD;  Location: [facility] ENDOSCOPY;  Service: Gastroenterology;  Laterality: N/A;   ESOPHAGOGASTRODUODENOSCOPY (EGD) WITH PROPOFOL N/A 09/02/2020   Procedure: ESOPHAGOGASTRODUODENOSCOPY (EGD) WITH PROPOFOL;  Surgeon: Toney Reil, MD;  Location: [facility] ENDOSCOPY;  Service: Gastroenterology;  Laterality: N/A;   IRRIGATION AND DEBRIDEMENT HEMATOMA Left 03/07/2018   Procedure: IRRIGATION AND DEBRIDEMENT HEMATOMA-LEFT BREAST;  Surgeon: Earline Mayotte, MD;  Location: [facility] ORS;  Service: General;  Laterality: Left;   LUNG BIOPSY  2011   RESECTION RIBS EXTRAPLEURAL  [id number]    Medical History: Past Medical History:  Diagnosis Date   Anemia    Anxiety    Asthma    Breast fibrocystic disorder 2013   Edema 09/15/2017   Hernia 2011   chest wall   Iron deficiency anemia 09/15/2017   Lung mass 2011   Sarcoidosis    Shortness of breath    2011   Tachycardia, unspecified 09/15/2017    Family History: Family History  Problem Relation Age of Onset   Liver cancer Mother 32   Bone cancer Maternal Aunt 60   Breast cancer Maternal Aunt    Breast cancer Paternal Aunt    Kidney cancer Father    Bone cancer Paternal Aunt 68    Social History   Socioeconomic History   Marital status: Divorced    Spouse name: Not on file   Number of children: Not on file   Years of education: Not on file   Highest education level: Not on file  Occupational History   Not on file   Tobacco Use   Smoking status: Never   Smokeless tobacco: Never  Vaping Use   Vaping Use: Never used  Substance and Sexual Activity   Alcohol use: Yes    Comment: on occassion   Drug use: No   Sexual activity: Not on file  Other Topics Concern   Not on file  Social History Narrative   Not on file   Social Determinants of Health   Financial Resource Strain: Not on file  Food Insecurity: Not on file  Transportation Needs: Not on file  Physical Activity: Not on file  Stress: Not on file  Social Connections: Not on file  Intimate Partner Violence: Not on file      Review of Systems  Constitutional:  Negative.  Negative for chills, fatigue and fever.  HENT: Negative.    Eyes: Negative.   Respiratory:  Positive for shortness of breath (intermittent). Negative for cough, chest tightness and wheezing.   Cardiovascular: Negative.  Negative for chest pain.  Gastrointestinal:  Positive for abdominal distention, abdominal pain, blood in stool, diarrhea and nausea. Negative for vomiting.  Genitourinary: Negative.  Negative for difficulty urinating, frequency and urgency.  Musculoskeletal:  Positive for arthralgias and myalgias.  Neurological:  Negative for dizziness, light-headedness and headaches.  Psychiatric/Behavioral:  Negative for behavioral problems, self-injury and suicidal ideas. The patient is not nervous/anxious.    Vital Signs: BP 133/86   Pulse 82   Temp 98.4 F (36.9 C)   Resp 16   Ht  (1.702 m)   Wt 165 lb 3.2 oz (74.9 kg)   SpO2 97%   BMI 25.87 kg/m    Physical Exam Vitals reviewed.  Constitutional:      General: She is not in acute distress.    Appearance: Normal appearance. She is not ill-appearing.  HENT:     Head: Normocephalic and atraumatic.  Cardiovascular:     Rate and Rhythm: Normal rate and regular rhythm.     Pulses: Normal pulses.     Heart sounds: Normal heart sounds. No murmur heard. Pulmonary:     Effort: Pulmonary effort is  normal. No respiratory distress.     Breath sounds: Normal breath sounds. No wheezing.  Abdominal:     General: Abdomen is protuberant. Bowel sounds are increased. There is distension.     Palpations: Abdomen is soft. There is no shifting dullness, fluid wave, mass or pulsatile mass.     Tenderness: There is abdominal tenderness in the right lower quadrant and left lower quadrant. There is guarding.     Comments: No rigidity noted, no mass palpated.  Skin:    General: Skin is warm and dry.     Capillary Refill: Capillary refill takes less than 2 seconds.  Neurological:     Mental Status: She is alert and oriented to person, place, and time.  Psychiatric:        Mood and Affect: Mood normal.        Behavior: Behavior normal.    Assessment/Plan: 1. Diverticulitis Brittany Huynh has been experiencing symptoms consistent with diverticulitis for several months. She was diagnosed with diverticulosis via colonoscopy in January 2022. Dual antibiotic therapy is recommended for diverticulitis and a liquid diet. For a few days.  - levofloxacin (LEVAQUIN) 750 MG tablet; Take 1 tablet (750 mg total) by mouth daily. For 5 days  Dispense: 5 tablet; Refill: 0 - metroNIDAZOLE (FLAGYL) 500 MG tablet; Take 1 tablet (500 mg total) by mouth 3 (three) times daily. For 10 days  Dispense: 30 tablet; Refill: 0  2. Connective tissue disease, undifferentiated (HCC) Chronic problem, followed by rheumatology.   3. Sarcoidosis of lung (HCC) Followed by rheumatology, tapering off prednisone. Will discuss alternative medications to replace prednisone with Dr. Beverely Risen because the patient is experiencing pain, and stiffness coming off of prednisone.   4. Mixed hyperlipidemia Stable, taking atorvastatin.   5. Essential hypertension, benign Stable with current medications.    General Counseling: britiny defrain understanding of the findings of todays visit and agrees with plan of treatment. I have discussed any further  diagnostic evaluation that may be needed or ordered today. We also reviewed her medications today. she has been encouraged to call the office with any questions or concerns that should  arise related to todays visit.    No orders of the defined types were placed in this encounter.   Meds ordered this encounter  Medications   levofloxacin (LEVAQUIN) 750 MG tablet    Sig: Take 1 tablet (750 mg total) by mouth daily. For 5 days    Dispense:  5 tablet    Refill:  0   metroNIDAZOLE (FLAGYL) 500 MG tablet    Sig: Take 1 tablet (500 mg total) by mouth 3 (three) times daily. For 10 days    Dispense:  30 tablet    Refill:  0    Return in about 2 weeks (around 03/22/2021) for F/U diverticulitis/abdominal pain with Dawna Part or Dr. Welton Flakes, whoever is available first. .   Total time spent:30 Minutes Time spent includes review of chart, medications, test results, and follow up plan with the patient.   [place] Controlled Substance Database was reviewed by me.  This patient was seen by [name], FNP-C in collaboration with Dr. Beverely Risen as a part of collaborative care agreement.   Giovana Faciane R. Tedd Sias, MSN, FNP-C Internal medicine

## 2021-03-29 ENCOUNTER — Other Ambulatory Visit: Payer: Self-pay

## 2021-03-29 ENCOUNTER — Ambulatory Visit (INDEPENDENT_AMBULATORY_CARE_PROVIDER_SITE_OTHER): Payer: Managed Care, Other (non HMO) | Admitting: Nurse Practitioner

## 2021-03-29 ENCOUNTER — Encounter: Payer: Self-pay | Admitting: Nurse Practitioner

## 2021-03-29 VITALS — BP 116/70 | HR 72 | Temp 98.1°F | Resp 16 | Ht 66.0 in | Wt 163.2 lb

## 2021-03-29 DIAGNOSIS — I10 Essential (primary) hypertension: Secondary | ICD-10-CM

## 2021-03-29 DIAGNOSIS — M2550 Pain in unspecified joint: Secondary | ICD-10-CM

## 2021-03-29 DIAGNOSIS — K219 Gastro-esophageal reflux disease without esophagitis: Secondary | ICD-10-CM

## 2021-03-29 DIAGNOSIS — K529 Noninfective gastroenteritis and colitis, unspecified: Secondary | ICD-10-CM | POA: Diagnosis not present

## 2021-03-29 DIAGNOSIS — D860 Sarcoidosis of lung: Secondary | ICD-10-CM | POA: Diagnosis not present

## 2021-03-29 DIAGNOSIS — G4700 Insomnia, unspecified: Secondary | ICD-10-CM

## 2021-03-29 DIAGNOSIS — F3341 Major depressive disorder, recurrent, in partial remission: Secondary | ICD-10-CM

## 2021-03-29 MED ORDER — MIRTAZAPINE 30 MG PO TABS: 30.0000 mg | ORAL_TABLET | Freq: Every day | ORAL | 1 refills | Status: DC

## 2021-03-29 MED ORDER — BISOPROLOL-HYDROCHLOROTHIAZIDE 10-6.25 MG PO TABS: ORAL_TABLET | ORAL | 1 refills | Status: DC

## 2021-03-29 MED ORDER — DULOXETINE HCL 60 MG PO CPEP: 60.0000 mg | ORAL_CAPSULE | Freq: Every day | ORAL | 3 refills | Status: DC

## 2021-03-29 MED ORDER — ZOLPIDEM TARTRATE 10 MG PO TABS: 10.0000 mg | ORAL_TABLET | Freq: Every evening | ORAL | 1 refills | Status: DC | PRN

## 2021-03-29 MED ORDER — DICYCLOMINE HCL 10 MG PO CAPS: 10.0000 mg | ORAL_CAPSULE | Freq: Every day | ORAL | 2 refills | Status: DC

## 2021-03-29 MED ORDER — AMLODIPINE BESYLATE 2.5 MG PO TABS: ORAL_TABLET | ORAL | 1 refills | Status: DC

## 2021-03-29 MED ORDER — PREDNISONE 5 MG PO TABS: 5.0000 mg | ORAL_TABLET | Freq: Every day | ORAL | 2 refills | Status: DC

## 2021-03-29 MED ORDER — CELECOXIB 200 MG PO CAPS: 200.0000 mg | ORAL_CAPSULE | Freq: Every day | ORAL | 3 refills | Status: DC

## 2021-03-29 NOTE — Progress Notes (Signed)
[facility] [address] [place], [place] [address]  Internal MEDICINE  Office Visit Note  Patient Name: Brittany Huynh  [id number]  [id number]  Date of Service: 04/07/2021  Chief Complaint  Patient presents with   Follow-up    Diverticulitis, , abdominal pain, med review    HPI Brittany Huynh presents for a follow up visit after being treated for possible diverticulitis. This has not helped. She is still experiencing the same GI symptoms of abdominal pain and loose stools. She is also having urinary incontinence and wears a pad just in case. She is still tapering off of the prednisone. She feels very stiff, like it is hard to move. She feels this in her upper and lower extremities as well as her back. She would like to be prescribed something in place of the prednisone. She has also found some bumps under the skin on her bilateral forearms and her abdomen.  She is scheduled to see the rheumatologist very soon. Brittany McDonough PA-C also assessed and palpated the bumps under the skin and talked with the patient about her multiple symptoms.  After discussing the symptoms with Lauren PA-C and the patient, it was agreed that it is very important to see what the rheumatologist thinks and the patient was instructed to mention all of the symptoms she is having to t he rheumatologist because autoimmune problems can affect multiple body systems.    Current Medication: Outpatient Encounter Medications as of 03/29/2021  Medication Sig Note   acetaminophen (TYLENOL) 500 MG tablet Take 500 mg by mouth every 6 (six) hours as needed. 09/29/2020: Can take 1 or 2 at a time   albuterol (PROVENTIL) (2.5 MG/3ML) 0.083% nebulizer solution Take 3 mLs (2.5 mg total) by nebulization every 6 (six) hours as needed for wheezing or shortness of breath.    albuterol (VENTOLIN HFA) 108 (90 Base) MCG/ACT inhaler Inhale 2 puffs into the lungs every 6 (six) hours as needed for wheezing or shortness of breath.     aspirin EC 81 MG EC tablet Take 1 tablet (81 mg total) by mouth daily. Swallow whole.    augmented betamethasone dipropionate (DIPROLENE) 0.05 % ointment Apply topically 2 (two) times daily.    DULoxetine (CYMBALTA) 60 MG capsule Take 1 capsule (60 mg total) by mouth daily.    fluticasone (FLOVENT HFA) 110 MCG/ACT inhaler Inhale 2 puffs into the lungs 2 (two) times daily.    hydroxychloroquine (PLAQUENIL) 200 MG tablet Take 1 tablet (200 mg total) by mouth 2 (two) times daily.    [DISCONTINUED] amLODipine (NORVASC) 2.5 MG tablet Take one tab po qhs for blood pressure    [DISCONTINUED] bisoprolol-hydrochlorothiazide (ZIAC) 10-6.25 MG tablet TAKE 1 TABLET BY MOUTH EVERY DAY IN THE MORNING FOR BLOOD PRESSURE    [DISCONTINUED] celecoxib (CELEBREX) 200 MG capsule Take 1 capsule (200 mg total) by mouth daily.    [DISCONTINUED] dicyclomine (BENTYL) 10 MG capsule TAKE 1 CAPSULE BY MOUTH EVERY DAY    [DISCONTINUED] DULoxetine (CYMBALTA) 30 MG capsule TAKE 1 CAPSULE BY MOUTH EVERY DAY    [DISCONTINUED] levofloxacin (LEVAQUIN) 750 MG tablet Take 1 tablet (750 mg total) by mouth daily. For 5 days    [DISCONTINUED] metroNIDAZOLE (FLAGYL) 500 MG tablet Take 1 tablet (500 mg total) by mouth 3 (three) times daily. For 10 days    [DISCONTINUED] mirtazapine (REMERON) 30 MG tablet TAKE 1 TABLET BY MOUTH AT BEDTIME.    [DISCONTINUED] predniSONE (DELTASONE) 5 MG tablet Take 1 tablet (5 mg total) by  mouth daily with breakfast.    [DISCONTINUED] zolpidem (AMBIEN) 10 MG tablet Take 1 tablet (10 mg total) by mouth at bedtime as needed for sleep.    amLODipine (NORVASC) 2.5 MG tablet Take one tab po qhs for blood pressure    bisoprolol-hydrochlorothiazide (ZIAC) 10-6.25 MG tablet TAKE 1 TABLET BY MOUTH EVERY DAY IN THE MORNING FOR BLOOD PRESSURE    celecoxib (CELEBREX) 200 MG capsule Take 1 capsule (200 mg total) by mouth daily.    dicyclomine (BENTYL) 10 MG capsule Take 1 capsule (10 mg total) by mouth daily.     mirtazapine (REMERON) 30 MG tablet Take 1 tablet (30 mg total) by mouth at bedtime.    predniSONE (DELTASONE) 5 MG tablet Take 1 tablet (5 mg total) by mouth daily with breakfast.    zolpidem (AMBIEN) 10 MG tablet Take 1 tablet (10 mg total) by mouth at bedtime as needed for sleep.    No facility-administered encounter medications on file as of 03/29/2021.    Surgical History: Past Surgical History:  Procedure Laterality Date   ABDOMINAL HYSTERECTOMY  2009   BREAST BIOPSY Left 02/15/2018   PREDOMINANTLY MATURE ADIPOSE TISSUE WITH VERY FOCAL AREAS OF BENIGN    BREAST EXCISIONAL BIOPSY     chamberlain procedure   2011   COLONOSCOPY WITH PROPOFOL N/A 09/02/2020   Procedure: COLONOSCOPY WITH PROPOFOL;  Surgeon: Toney Reil, MD;  Location: [facility] ENDOSCOPY;  Service: Gastroenterology;  Laterality: N/A;   ESOPHAGOGASTRODUODENOSCOPY (EGD) WITH PROPOFOL N/A 09/02/2020   Procedure: ESOPHAGOGASTRODUODENOSCOPY (EGD) WITH PROPOFOL;  Surgeon: Toney Reil, MD;  Location: [facility] ENDOSCOPY;  Service: Gastroenterology;  Laterality: N/A;   IRRIGATION AND DEBRIDEMENT HEMATOMA Left 03/07/2018   Procedure: IRRIGATION AND DEBRIDEMENT HEMATOMA-LEFT BREAST;  Surgeon: Earline Mayotte, MD;  Location: [facility] ORS;  Service: General;  Laterality: Left;   LUNG BIOPSY  2011   RESECTION RIBS EXTRAPLEURAL  [id number]    Medical History: Past Medical History:  Diagnosis Date   Anemia    Anxiety    Asthma    Breast fibrocystic disorder 2013   Edema 09/15/2017   Hernia 2011   chest wall   Iron deficiency anemia 09/15/2017   Lung mass 2011   Sarcoidosis    Shortness of breath    2011   Tachycardia, unspecified 09/15/2017    Family History: Family History  Problem Relation Age of Onset   Liver cancer Mother 46   Bone cancer Maternal Aunt 60   Breast cancer Maternal Aunt    Breast cancer Paternal Aunt    Kidney cancer Father    Bone cancer Paternal Aunt 66    Social History   Socioeconomic History    Marital status: Divorced    Spouse name: Not on file   Number of children: Not on file   Years of education: Not on file   Highest education level: Not on file  Occupational History   Not on file  Tobacco Use   Smoking status: Never   Smokeless tobacco: Never  Vaping Use   Vaping Use: Never used  Substance and Sexual Activity   Alcohol use: Yes    Comment: on occassion   Drug use: No   Sexual activity: Not on file  Other Topics Concern   Not on file  Social History Narrative   Not on file   Social Determinants of Health   Financial Resource Strain: Not on file  Food Insecurity: Not on file  Transportation Needs: Not on file  Physical  Activity: Not on file  Stress: Not on file  Social Connections: Not on file  Intimate Partner Violence: Not on file     Review of Systems  Constitutional:  Positive for fatigue. Negative for chills and fever.  HENT: Negative.    Eyes:  Positive for visual disturbance (blurred vision).  Respiratory: Negative.  Negative for cough, chest tightness, shortness of breath and wheezing.   Cardiovascular: Negative.  Negative for chest pain.  Gastrointestinal:  Positive for abdominal pain and diarrhea. Negative for blood in stool, constipation, nausea and vomiting.  Genitourinary:  Positive for difficulty urinating (incontinence) and urgency.  Musculoskeletal:  Positive for arthralgias, back pain, joint swelling, myalgias and neck pain.  Skin: Negative.   Neurological:  Positive for dizziness, weakness and headaches. Negative for light-headedness.  Psychiatric/Behavioral:  Positive for sleep disturbance. Negative for self-injury and suicidal ideas. The patient is nervous/anxious.    Vital Signs: BP 116/70   Pulse 72   Temp 98.1 F (36.7 C)   Resp 16   Ht  (1.676 m)   Wt 163 lb 3.2 oz (74 kg)   SpO2 97%   BMI 26.34 kg/m    Physical Exam Constitutional:      General: She is not in acute distress.    Appearance: She is  ill-appearing. She is not diaphoretic.  HENT:     Head: Normocephalic and atraumatic.  Cardiovascular:     Rate and Rhythm: Normal rate and regular rhythm.     Pulses: Normal pulses.  Pulmonary:     Effort: Pulmonary effort is normal. No respiratory distress.  Skin:    General: Skin is warm and dry.     Capillary Refill: Capillary refill takes less than 2 seconds.     Findings: Rash present. Rash is nodular (cluster of nodules noted on bilateral forearms and on mid abdomen).  Neurological:     Mental Status: She is alert and oriented to person, place, and time.     Assessment/Plan: 1. Diffuse arthralgia Having difficulty tapering off prednisone, she is scheduled to see rheumatologist Dr. Gerrie Nordmann on 03/29/21. She is having widespread joint stiffness, pain and swelling.  - celecoxib (CELEBREX) 200 MG capsule; Take 1 capsule (200 mg total) by mouth daily.  Dispense: 90 capsule; Refill: 3  2. Chronic diarrhea She has been having difficulty with bowel pattern. At her previous office visit, she was prescribed treatment for diverticulitis. Her colonoscopy from January 2022 showed diverticulosis and her symptoms were consistent with possible diverticulitis at her previous office visit. The treatment did not alleviate her symptoms. It is still possible that there could be fecal impaction, small bowel obstruction or ileus. She does have a history of having an ileus in the past. When obtaining imaging was mentioned at the previous office visit, she declined. She does have insurance but has mentioned difficulty with affording imaging studies. When mentioning imaging again at the office visit today, she continued to decline. She will be seeing the rheumatologist on Monday and since some autoimmune disorders can affect multiple body systems, she was encouraged to discuss all of her symptoms including the GI symptoms with her rheumatologist to he is able to rule out all possible diagnoses.  3. Essential  hypertension, benign Stable with current medications, refills ordered - bisoprolol-hydrochlorothiazide (ZIAC) 10-6.25 MG tablet; TAKE 1 TABLET BY MOUTH EVERY DAY IN THE MORNING FOR BLOOD PRESSURE  Dispense: 90 tablet; Refill: 1 - amLODipine (NORVASC) 2.5 MG tablet; Take one tab po qhs for blood  pressure  Dispense: 90 tablet; Refill: 1  4. Sarcoidosis of lung (HCC) She has been having difficulty tapering off prednisone, she has been on it for a long time. No changes were made on today's visit.  - predniSONE (DELTASONE) 5 MG tablet; Take 1 tablet (5 mg total) by mouth daily with breakfast.  Dispense: 30 tablet; Refill: 2  5. Gastro-esophageal reflux disease without esophagitis Stable, requesting dicyclomine to be restarted, order sent to pharmacy.  - dicyclomine (BENTYL) 10 MG capsule; Take 1 capsule (10 mg total) by mouth daily.  Dispense: 30 capsule; Refill: 2  6. Insomnia, unspecified type Stable with current medications, refill ordered.  - zolpidem (AMBIEN) 10 MG tablet; Take 1 tablet (10 mg total) by mouth at bedtime as needed for sleep.  Dispense: 30 tablet; Refill: 1 - mirtazapine (REMERON) 30 MG tablet; Take 1 tablet (30 mg total) by mouth at bedtime.  Dispense: 90 tablet; Refill: 1  7. Recurrent major depressive disorder, in partial remission (HCC) Stable, patient feels like this is well controlled, she knows that the reason she is feeling down right now is because of her current medical problems and she is feeling hopeful about her upcoming visit with the rheumatologist in a few days. She has been on duloxetine 30 mg daily, increased dose to 60 mg daily today. There have been studies that have shown that duloxetine can help with musculoskeletal pain and the dose that has shown to be beneficial was 60 mg daily. She was agreeable with this change.  - DULoxetine (CYMBALTA) 60 MG capsule; Take 1 capsule (60 mg total) by mouth daily.  Dispense: 30 capsule; Refill: 3   General Counseling:  Netanya verbalizes understanding of the findings of todays visit and agrees with plan of treatment. I have discussed any further diagnostic evaluation that may be needed or ordered today. We also reviewed her medications today. she has been encouraged to call the office with any questions or concerns that should arise related to todays visit.    No orders of the defined types were placed in this encounter.   Meds ordered this encounter  Medications   celecoxib (CELEBREX) 200 MG capsule    Sig: Take 1 capsule (200 mg total) by mouth daily.    Dispense:  90 capsule    Refill:  3   predniSONE (DELTASONE) 5 MG tablet    Sig: Take 1 tablet (5 mg total) by mouth daily with breakfast.    Dispense:  30 tablet    Refill:  2   zolpidem (AMBIEN) 10 MG tablet    Sig: Take 1 tablet (10 mg total) by mouth at bedtime as needed for sleep.    Dispense:  30 tablet    Refill:  1   mirtazapine (REMERON) 30 MG tablet    Sig: Take 1 tablet (30 mg total) by mouth at bedtime.    Dispense:  90 tablet    Refill:  1   dicyclomine (BENTYL) 10 MG capsule    Sig: Take 1 capsule (10 mg total) by mouth daily.    Dispense:  30 capsule    Refill:  2   bisoprolol-hydrochlorothiazide (ZIAC) 10-6.25 MG tablet    Sig: TAKE 1 TABLET BY MOUTH EVERY DAY IN THE MORNING FOR BLOOD PRESSURE    Dispense:  90 tablet    Refill:  1   amLODipine (NORVASC) 2.5 MG tablet    Sig: Take one tab po qhs for blood pressure    Dispense:  90 tablet  Refill:  1   DULoxetine (CYMBALTA) 60 MG capsule    Sig: Take 1 capsule (60 mg total) by mouth daily.    Dispense:  30 capsule    Refill:  3    Return in about 1 month (around 04/29/2021) for F/U, Anxiety/depression, Amarian Botero PCP.   Total time spent:30 Minutes Time spent includes review of chart, medications, test results, and follow up plan with the patient.   [place] Controlled Substance Database was reviewed by me.  This patient was seen by [name], FNP-C in collaboration with  Dr. Beverely Risen as a part of collaborative care agreement.   Genae Strine R. Tedd Sias, MSN, FNP-C Internal medicine

## 2021-04-03 ENCOUNTER — Other Ambulatory Visit: Payer: Self-pay | Admitting: Internal Medicine

## 2021-04-05 ENCOUNTER — Ambulatory Visit: Payer: 59 | Admitting: Gastroenterology

## 2021-04-12 ENCOUNTER — Other Ambulatory Visit: Payer: Self-pay

## 2021-04-13 ENCOUNTER — Other Ambulatory Visit: Payer: Self-pay

## 2021-04-13 ENCOUNTER — Encounter: Payer: Self-pay | Admitting: Gastroenterology

## 2021-04-13 ENCOUNTER — Ambulatory Visit: Payer: Managed Care, Other (non HMO) | Admitting: Gastroenterology

## 2021-04-13 VITALS — BP 130/84 | HR 78 | Temp 97.9°F | Ht 66.0 in | Wt 164.1 lb

## 2021-04-13 DIAGNOSIS — R1010 Upper abdominal pain, unspecified: Secondary | ICD-10-CM | POA: Diagnosis not present

## 2021-04-13 DIAGNOSIS — R140 Abdominal distension (gaseous): Secondary | ICD-10-CM

## 2021-04-13 DIAGNOSIS — R195 Other fecal abnormalities: Secondary | ICD-10-CM

## 2021-04-13 NOTE — Patient Instructions (Signed)
Low-FODMAP Eating Plan °FODMAP stands for fermentable oligosaccharides, disaccharides, monosaccharides, and polyols. These are sugars that are hard for some people to digest. A low-FODMAP eating plan may help some people who have irritable bowel syndrome (IBS) and certain other bowel (intestinal) diseases to manage their symptoms. °This meal plan can be complicated to follow. Work with a diet and nutrition specialist (dietitian) to make a low-FODMAP eating plan that is right for you. A dietitian can help make sure that you get enough nutrition from this diet. °What are tips for following this plan? °Reading food labels °Check labels for hidden FODMAPs such as: °High-fructose syrup. °Honey. °Agave. °Natural fruit flavors. °Onion or garlic powder. °Choose low-FODMAP foods that contain 3-4 grams of fiber per serving. °Check food labels for serving sizes. Eat only one serving at a time to make sure FODMAP levels stay low. °Shopping °Shop with a list of foods that are recommended on this diet and make a meal plan. °Meal planning °Follow a low-FODMAP eating plan for up to 6 weeks, or as told by your health care provider or dietitian. °To follow the eating plan: °Eliminate high-FODMAP foods from your diet completely. Choose only low-FODMAP foods to eat. You will do this for 2-6 weeks. °Gradually reintroduce high-FODMAP foods into your diet one at a time. Most people should wait a few days before introducing the next new high-FODMAP food into their meal plan. Your dietitian can recommend how quickly you may reintroduce foods. °Keep a daily record of what and how much you eat and drink. Make note of any symptoms that you have after eating. °Review your daily record with a dietitian regularly to identify which foods you can eat and which foods you should avoid. °General tips °Drink enough fluid each day to keep your urine pale yellow. °Avoid processed foods. These often have added sugar and may be high in FODMAPs. °Avoid most  dairy products, whole grains, and sweeteners. °Work with a dietitian to make sure you get enough fiber in your diet. °Avoid high FODMAP foods at meals to manage symptoms. °Recommended foods °Fruits °Bananas, oranges, tangerines, lemons, limes, blueberries, raspberries, strawberries, grapes, cantaloupe, honeydew melon, kiwi, papaya, passion fruit, and pineapple. Limited amounts of dried cranberries, banana chips, and shredded coconut. °Vegetables °Eggplant, zucchini, cucumber, peppers, green beans, bean sprouts, lettuce, arugula, kale, Swiss chard, spinach, collard greens, bok choy, summer squash, potato, and tomato. Limited amounts of corn, carrot, and sweet potato. Green parts of scallions. °Grains °Gluten-free grains, such as rice, oats, buckwheat, quinoa, corn, polenta, and millet. Gluten-free pasta, bread, or cereal. Rice noodles. Corn tortillas. °Meats and other proteins °Unseasoned beef, pork, poultry, or fish. Eggs. Bacon. Tofu (firm) and tempeh. Limited amounts of nuts and seeds, such as almonds, walnuts, brazil nuts, pecans, peanuts, nut butters, pumpkin seeds, chia seeds, and sunflower seeds. °Dairy °Lactose-free milk, yogurt, and kefir. Lactose-free cottage cheese and ice cream. Non-dairy milks, such as almond, coconut, hemp, and rice milk. Non-dairy yogurt. Limited amounts of goat cheese, brie, mozzarella, parmesan, swiss, and other hard cheeses. °Fats and oils °Butter-free spreads. Vegetable oils, such as olive, canola, and sunflower oil. °Seasoning and other foods °Artificial sweeteners with names that do not end in "ol," such as aspartame, saccharine, and stevia. Maple syrup, white table sugar, raw sugar, brown sugar, and molasses. Mayonnaise, soy sauce, and tamari. Fresh basil, coriander, parsley, rosemary, and thyme. °Beverages °Water and mineral water. Sugar-sweetened soft drinks. Small amounts of orange juice or cranberry juice. Black and green tea. Most dry wines. Coffee. °  The items listed above  may not be a complete list of foods and beverages you can eat. Contact a dietitian for more information. °Foods to avoid °Fruits °Fresh, dried, and juiced forms of apple, pear, watermelon, peach, plum, cherries, apricots, blackberries, boysenberries, figs, nectarines, and mango. Avocado. °Vegetables °Chicory root, artichoke, asparagus, cabbage, snow peas, Brussels sprouts, broccoli, sugar snap peas, mushrooms, celery, and cauliflower. Onions, garlic, leeks, and the white part of scallions. °Grains °Wheat, including kamut, durum, and semolina. Barley and bulgur. Couscous. Wheat-based cereals. Wheat noodles, bread, crackers, and pastries. °Meats and other proteins °Fried or fatty meat. Sausage. Cashews and pistachios. Soybeans, baked beans, black beans, chickpeas, kidney beans, fava beans, navy beans, lentils, black-eyed peas, and split peas. °Dairy °Milk, yogurt, ice cream, and soft cheese. Cream and sour cream. Milk-based sauces. Custard. Buttermilk. Soy milk. °Seasoning and other foods °Any sugar-free gum or candy. Foods that contain artificial sweeteners such as sorbitol, mannitol, isomalt, or xylitol. Foods that contain honey, high-fructose corn syrup, or agave. Bouillon, vegetable stock, beef stock, and chicken stock. Garlic and onion powder. Condiments made with onion, such as hummus, chutney, pickles, relish, salad dressing, and salsa. Tomato paste. °Beverages °Chicory-based drinks. Coffee substitutes. Chamomile tea. Fennel tea. Sweet or fortified wines such as port or sherry. Diet soft drinks made with isomalt, mannitol, maltitol, sorbitol, or xylitol. Apple, pear, and mango juice. Juices with high-fructose corn syrup. °The items listed above may not be a complete list of foods and beverages you should avoid. Contact a dietitian for more information. °Summary °FODMAP stands for fermentable oligosaccharides, disaccharides, monosaccharides, and polyols. These are sugars that are hard for some people to  digest. °A low-FODMAP eating plan is a short-term diet that helps to ease symptoms of certain bowel diseases. °The eating plan usually lasts up to 6 weeks. After that, high-FODMAP foods are reintroduced gradually and one at a time. This can help you find out which foods may be causing symptoms. °A low-FODMAP eating plan can be complicated. It is best to work with a dietitian who has experience with this type of plan. °This information is not intended to replace advice given to you by your health care provider. Make sure you discuss any questions you have with your health care provider. °Document Revised: 01/02/2020 Document Reviewed: 01/02/2020 °Elsevier Patient Education © 2022 [employer] ° °

## 2021-04-13 NOTE — Progress Notes (Signed)
Brittany Repress, MD [address]  [address]  [place], [place] [address]  Main: [phone]  Fax: [phone]    Gastroenterology Consultation  Referring Provider:     Lyndon Code, MD Primary Care Physician:  Lyndon Code, MD Primary Gastroenterologist:  Dr. Arlyss Huynh Reason for Consultation:     Chronic diarrhea, abdominal bloating        HPI:   Brittany Huynh is a 55 y.o. female referred by Dr. Welton Flakes, Shannan Harper, MD  for consultation & management of chronic diarrhea.  Patient reports approximately 2 months history of nonbloody watery bowel movements anywhere from [facility] stool scale 5-7, about 5-6 times a day, not necessarily postprandial associated with lower abdominal cramps as well as bloating.  She does report urgency but did not have any fecal incontinence.  Denies any weight loss.  She denies consumption of carbonated beverages or artificial sweeteners regularly.  She is noted to have chronic iron deficiency, ferritin less than 25 and recently mild anemia, hemoglobin 11.9.  Had B12 deficiency in 2019, was receiving B12 injections, until recently.  Her B12 levels were normal in 02/2019.  She does have history of chronic migraine headaches for which she takes Excedrin intermittently.  Patient does have history of sarcoidosis.  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  She is currently on omeprazole 40 mg once a day which does help with her upper GI symptoms partially.  She reports ongoing abdominal bloating and upper abdominal discomfort.  She reports that she is no longer experiencing diarrhea, however her stools are mushy and foul-smelling, on Bristol stool scale 5.  She was seen by Dr. Smith Robert for iron deficiency.  Her most recent ferritin levels are normal.  She is currently taking over-the-counter iron  supplements.  She is also taking over-the-counter B12 supplements.  Her weight is stable.  Patient was recently hospitalized in January secondary to Covid pneumonia, treated with 11 days where and prednisone Patient reports that she had multiple deaths in her family within last 1 year.  She does not have good sleep  Follow-up visit 12/10/2020 Patient has ongoing abdominal bloating associated with discomfort, it is generalized.  She denies any localized abdominal pain.  She does report only intermittent diarrhea.  Her weight is overall stable.  She reports that her bloating is persistent even if she does not eat anything all day.  She does have severe lactose intolerance, and tries to eliminate lactose-containing foods.  She does acknowledge chewing sugar-free gum in order to keep her mouth moist.  She did not get pancreatic fecal elastase levels done.  Follow-up visit 04/14/2021 Patient is here for follow-up of intermittent diarrhea, nonbloody associated with abdominal bloating and discomfort.  Her weight has been stable.  She has tried rifaximin for possible bacterial overgrowth which did not help.  She also tried pancreatic enzymes which did not help. She does not think she has any stress in her life.  Patient is started on azathioprine for inflammatory arthritis by her rheumatologist.  NSAIDs: None  Antiplts/Anticoagulants/Anti thrombotics: None  GI Procedures:  Upper endoscopy 09/02/2020 - Normal duodenal bulb and second portion of the duodenum. Biopsied. - Erosive gastropathy with no bleeding and no stigmata of recent bleeding. Biopsied. - Normal cardia, gastric fundus and gastric body. Biopsied. - Small hiatal hernia. - Esophagogastric landmarks  identified. - Widely patent and non-obstructing Schatzki ring.  DIAGNOSIS:  A. DUODENUM; COLD BIOPSY:  - ENTERIC MUCOSA WITH PRESERVED VILLOUS ARCHITECTURE AND NO SIGNIFICANT  HISTOPATHOLOGIC CHANGE.  - NEGATIVE FOR FEATURES OF CELIAC, DYSPLASIA,  AND MALIGNANCY.   B. STOMACH; COLD BIOPSY:  - GASTRIC ANTRAL AND OXYNTIC MUCOSA WITH FEATURES OF MILD REACTIVE  GASTROPATHY.  - NEGATIVE FOR H. PYLORI, DYSPLASIA, AND MALIGNANCY.   Colonoscopy 09/02/2020 - Preparation of the colon was fair. - The examined portion of the ileum was normal. - Diverticulosis in the sigmoid colon and in the descending colon. - The entire examined colon is normal. - No specimens collected.  She denies family history of GI malignancy  Past Medical History:  Diagnosis Date   Anemia    Anxiety    Asthma    Breast fibrocystic disorder 2013   Edema 09/15/2017   Hernia 2011   chest wall   Iron deficiency anemia 09/15/2017   Lung mass 2011   Sarcoidosis    Shortness of breath    2011   Tachycardia, unspecified 09/15/2017    Past Surgical History:  Procedure Laterality Date   ABDOMINAL HYSTERECTOMY  2009   BREAST BIOPSY Left 02/15/2018   PREDOMINANTLY MATURE ADIPOSE TISSUE WITH VERY FOCAL AREAS OF BENIGN    BREAST EXCISIONAL BIOPSY     chamberlain procedure   2011   COLONOSCOPY WITH PROPOFOL N/A 09/02/2020   Procedure: COLONOSCOPY WITH PROPOFOL;  Surgeon: Toney Reil, MD;  Location: [facility] ENDOSCOPY;  Service: Gastroenterology;  Laterality: N/A;   ESOPHAGOGASTRODUODENOSCOPY (EGD) WITH PROPOFOL N/A 09/02/2020   Procedure: ESOPHAGOGASTRODUODENOSCOPY (EGD) WITH PROPOFOL;  Surgeon: Toney Reil, MD;  Location: [facility] ENDOSCOPY;  Service: Gastroenterology;  Laterality: N/A;   IRRIGATION AND DEBRIDEMENT HEMATOMA Left 03/07/2018   Procedure: IRRIGATION AND DEBRIDEMENT HEMATOMA-LEFT BREAST;  Surgeon: Earline Mayotte, MD;  Location: [facility] ORS;  Service: General;  Laterality: Left;   LUNG BIOPSY  2011   RESECTION RIBS EXTRAPLEURAL  [id number]    Current Outpatient Medications:    acetaminophen (TYLENOL) 500 MG tablet, Take 500 mg by mouth every 6 (six) hours as needed., Disp: , Rfl:    albuterol (PROVENTIL) (2.5 MG/3ML) 0.083% nebulizer solution, Take 3 mLs  (2.5 mg total) by nebulization every 6 (six) hours as needed for wheezing or shortness of breath., Disp: 150 mL, Rfl: 1   albuterol (VENTOLIN HFA) 108 (90 Base) MCG/ACT inhaler, Inhale 2 puffs into the lungs every 6 (six) hours as needed for wheezing or shortness of breath., Disp: 18 g, Rfl: 3   amLODipine (NORVASC) 2.5 MG tablet, Take by mouth., Disp: , Rfl:    aspirin EC 81 MG EC tablet, Take 1 tablet (81 mg total) by mouth daily. Swallow whole., Disp: 30 tablet, Rfl: 11   augmented betamethasone dipropionate (DIPROLENE) 0.05 % ointment, Apply topically 2 (two) times daily., Disp: 45 g, Rfl: 0   azaTHIOprine (IMURAN) 50 MG tablet, Take 1 tablet by mouth daily., Disp: , Rfl:    bisoprolol-hydrochlorothiazide (ZIAC) 10-6.25 MG tablet, TAKE 1 TABLET BY MOUTH EVERY DAY IN THE MORNING FOR BLOOD PRESSURE, Disp: 90 tablet, Rfl: 1   celecoxib (CELEBREX) 200 MG capsule, Take 1 capsule (200 mg total) by mouth daily., Disp: 90 capsule, Rfl: 3   dicyclomine (BENTYL) 10 MG capsule, Take 1 capsule (10 mg total) by mouth daily., Disp: 30 capsule, Rfl: 2   DULoxetine (CYMBALTA) 60 MG capsule, Take 1 capsule (60 mg total) by mouth daily., Disp: 30 capsule, Rfl: 3  fluticasone (FLOVENT HFA) 110 MCG/ACT inhaler, Inhale 2 puffs into the lungs 2 (two) times daily., Disp: 3 each, Rfl: 12   hydroxychloroquine (PLAQUENIL) 200 MG tablet, Take 1 tablet (200 mg total) by mouth 2 (two) times daily., Disp: 180 tablet, Rfl: 1   mirtazapine (REMERON) 30 MG tablet, Take 1 tablet (30 mg total) by mouth at bedtime., Disp: 90 tablet, Rfl: 1   predniSONE (DELTASONE) 5 MG tablet, Take 1 tablet (5 mg total) by mouth daily with breakfast., Disp: 30 tablet, Rfl: 2   zolpidem (AMBIEN) 10 MG tablet, Take 1 tablet (10 mg total) by mouth at bedtime as needed for sleep., Disp: 30 tablet, Rfl: 1   Family History  Problem Relation Age of Onset   Liver cancer Mother 40   Bone cancer Maternal Aunt 30   Breast cancer Maternal Aunt    Breast  cancer Paternal Aunt    Kidney cancer Father    Bone cancer Paternal Aunt 62     Social History   Tobacco Use   Smoking status: Never   Smokeless tobacco: Never  Vaping Use   Vaping Use: Never used  Substance Use Topics   Alcohol use: Yes    Comment: on occassion   Drug use: No    Allergies as of 04/13/2021 - Review Complete 04/13/2021  Allergen Reaction Noted   Onion Anaphylaxis 07/03/2012   Adhesive [tape] Rash 07/03/2012    Review of Systems:    All systems reviewed and negative except where noted in HPI.   Physical Exam:  BP 130/84 (BP Location: Left Arm, Patient Position: Sitting, Cuff Size: Normal)   Pulse 78   Temp 97.9 F (36.6 C) (Oral)   Ht 5\' 6"  (1.676 m)   Wt 164 lb 2 oz (74.4 kg)   BMI 26.49 kg/m  No LMP recorded. Patient has had a hysterectomy.  General:   Alert,  Well-developed, well-nourished, pleasant and cooperative in NAD Head:  Normocephalic and atraumatic. Eyes:  Sclera clear, no icterus.   Conjunctiva pink. Ears:  Normal auditory acuity. Nose:  No deformity, discharge, or lesions. Mouth:  No deformity or lesions,oropharynx pink & moist. Neck:  Supple; no masses or thyromegaly. Lungs:  Respirations even and unlabored.  Clear throughout to auscultation.   No wheezes, crackles, or rhonchi. No acute distress. Heart:  Regular rate and rhythm; no murmurs, clicks, rubs, or gallops. Abdomen:  Normal bowel sounds. Soft, generalized abdominal distention, moderate, tympanic to percussion without masses, hepatosplenomegaly or hernias noted.  No guarding or rebound tenderness.   Rectal: Not performed Msk:  Symmetrical without gross deformities. Good, equal movement & strength bilaterally. Pulses:  Normal pulses noted. Extremities:  No clubbing or edema.  No cyanosis. Neurologic:  Alert and oriented x3;  grossly normal neurologically. Skin:  Intact without significant lesions or rashes. No jaundice. Psych:  Alert and cooperative. Normal mood and  affect.  Imaging Studies: Reviewed  Assessment and Plan:   BRIETTA KELLAR is a 55 y.o. pleasant female with history of pulmonary sarcoidosis, hypertension, lactose intolerance is seen in for follow-up of chronic intermittent diarrhea associated with abdominal bloating and cramps.    Chronic intermittent diarrhea, with abdominal bloating and cramps GI profile PCR is negative EGD revealed mild gastric erosions, with gastric biopsies revealed acute gastritis, no evidence of H. pylori and duodenal biopsies are negative, colonoscopy revealed normal TI and colon, H. pylori IgG is negative pancreatic fecal elastase levels were normal Empiric trial of pancreatic enzymes, as well as rifaximin  did not help CT abdomen and pelvis without contrast was negative Check fecal calprotectin levels and Discussed about maintaining a food diary for next 1 month as well as advised low FODMAP diet, information provided Start lactobacillus probiotics given history of lactose intolerance May consider video capsule endoscopy in future  History of iron and B12 deficiency anemia: Currently resolved EGD and colonoscopy are unremarkable   Follow up in 3 months   Brittany Repress, MD

## 2021-04-22 ENCOUNTER — Other Ambulatory Visit: Payer: Self-pay | Admitting: Nurse Practitioner

## 2021-04-22 DIAGNOSIS — F3341 Major depressive disorder, recurrent, in partial remission: Secondary | ICD-10-CM

## 2021-04-26 ENCOUNTER — Encounter: Payer: Self-pay | Admitting: Nurse Practitioner

## 2021-04-26 ENCOUNTER — Other Ambulatory Visit: Payer: Self-pay

## 2021-04-26 ENCOUNTER — Ambulatory Visit: Payer: Managed Care, Other (non HMO) | Admitting: Internal Medicine

## 2021-04-26 VITALS — BP 122/92 | HR 82 | Temp 97.2°F | Resp 16 | Ht 66.0 in | Wt 161.6 lb

## 2021-04-26 DIAGNOSIS — D860 Sarcoidosis of lung: Secondary | ICD-10-CM

## 2021-04-26 DIAGNOSIS — I10 Essential (primary) hypertension: Secondary | ICD-10-CM | POA: Diagnosis not present

## 2021-04-26 DIAGNOSIS — M2550 Pain in unspecified joint: Secondary | ICD-10-CM | POA: Diagnosis not present

## 2021-04-26 MED ORDER — PNEUMOCOCCAL 20-VAL CONJ VACC 0.5 ML IM SUSY: 0.5000 mL | PREFILLED_SYRINGE | INTRAMUSCULAR | 0 refills | Status: AC

## 2021-04-26 MED ORDER — FLUTICASONE-SALMETEROL 100-50 MCG/ACT IN AEPB: 1.0000 | INHALATION_SPRAY | Freq: Two times a day (BID) | RESPIRATORY_TRACT | 3 refills | Status: DC

## 2021-04-26 NOTE — Progress Notes (Signed)
[facility] [address] [place], [place] [address]  Internal MEDICINE  Office Visit Note  Patient Name: Brittany Huynh  [id number]  [id number]  Date of Service: 05/04/2021  Chief Complaint  Patient presents with   Follow-up    Stiff, achy, discuss meds, refills, right elbow area hurts, noticed about 2 to 3 weeks ago, knots on left forearm area    Anxiety   Asthma   Anemia    HPI  Pt is here for routine follow up 1. Has been seen for ongoing symptoms of asthma, she was given Trelegy but it is not covered through her insurance  2. BP Is improved  3. Recent labs did show low albumin and abnormal glucose  4. Does  Have sarcoid and on chronic steroids. Has seen [facility] and was advised steroids taper  5. Depression is under better control however requires Ambien for sleep   6. Trying to lose wt  7. Has seen Rheumatology and was prescribed Plaquenil    Current Medication: Outpatient Encounter Medications as of 04/26/2021  Medication Sig Note   acetaminophen (TYLENOL) 500 MG tablet Take 500 mg by mouth every 6 (six) hours as needed. 09/29/2020: Can take 1 or 2 at a time   albuterol (VENTOLIN HFA) 108 (90 Base) MCG/ACT inhaler Inhale 2 puffs into the lungs every 6 (six) hours as needed for wheezing or shortness of breath.    amLODipine (NORVASC) 2.5 MG tablet Take by mouth.    aspirin EC 81 MG EC tablet Take 1 tablet (81 mg total) by mouth daily. Swallow whole.    azaTHIOprine (IMURAN) 50 MG tablet Take 1 tablet by mouth daily.    bisoprolol-hydrochlorothiazide (ZIAC) 10-6.25 MG tablet TAKE 1 TABLET BY MOUTH EVERY DAY IN THE MORNING FOR BLOOD PRESSURE    celecoxib (CELEBREX) 200 MG capsule Take 1 capsule (200 mg total) by mouth daily.    dicyclomine (BENTYL) 10 MG capsule Take 1 capsule (10 mg total) by mouth daily.    DULoxetine (CYMBALTA) 60 MG capsule TAKE 1 CAPSULE BY MOUTH EVERY DAY    fluticasone-salmeterol (ADVAIR) 100-50 MCG/ACT AEPB Inhale 1 puff into the  lungs 2 (two) times daily.    hydroxychloroquine (PLAQUENIL) 200 MG tablet Take 1 tablet (200 mg total) by mouth 2 (two) times daily.    mirtazapine (REMERON) 30 MG tablet Take 1 tablet (30 mg total) by mouth at bedtime.    predniSONE (DELTASONE) 5 MG tablet Take 1 tablet (5 mg total) by mouth daily with breakfast.    zolpidem (AMBIEN) 10 MG tablet Take 1 tablet (10 mg total) by mouth at bedtime as needed for sleep.    [DISCONTINUED] fluticasone (FLOVENT HFA) 110 MCG/ACT inhaler Inhale 2 puffs into the lungs 2 (two) times daily.    [DISCONTINUED] pneumococcal 20-Val Conj Vacc (PREVNAR 20) 0.5 ML injection Inject 0.5 mLs into the muscle tomorrow at 10 am.    [EXPIRED] pneumococcal 20-Val Conj Vacc (PREVNAR 20) 0.5 ML injection Inject 0.5 mLs into the muscle tomorrow at 10 am for 1 dose.    [DISCONTINUED] albuterol (PROVENTIL) (2.5 MG/3ML) 0.083% nebulizer solution Take 3 mLs (2.5 mg total) by nebulization every 6 (six) hours as needed for wheezing or shortness of breath. (Patient not taking: Reported on 04/26/2021)    [DISCONTINUED] augmented betamethasone dipropionate (DIPROLENE) 0.05 % ointment Apply topically 2 (two) times daily. (Patient not taking: Reported on 04/26/2021)    [DISCONTINUED] DULoxetine (CYMBALTA) 30 MG capsule Take 30 mg by mouth daily. (Patient  not taking: Reported on 04/26/2021)    [DISCONTINUED] DULoxetine (CYMBALTA) 60 MG capsule Take 1 capsule (60 mg total) by mouth daily.    No facility-administered encounter medications on file as of 04/26/2021.    Surgical History: Past Surgical History:  Procedure Laterality Date   ABDOMINAL HYSTERECTOMY  2009   BREAST BIOPSY Left 02/15/2018   PREDOMINANTLY MATURE ADIPOSE TISSUE WITH VERY FOCAL AREAS OF BENIGN    BREAST EXCISIONAL BIOPSY     chamberlain procedure   2011   COLONOSCOPY WITH PROPOFOL N/A 09/02/2020   Procedure: COLONOSCOPY WITH PROPOFOL;  Surgeon: Toney Reil, MD;  Location: [facility] ENDOSCOPY;  Service:  Gastroenterology;  Laterality: N/A;   ESOPHAGOGASTRODUODENOSCOPY (EGD) WITH PROPOFOL N/A 09/02/2020   Procedure: ESOPHAGOGASTRODUODENOSCOPY (EGD) WITH PROPOFOL;  Surgeon: Toney Reil, MD;  Location[facility] ENDOSCOPY;  Service: Gastroenterology;  Laterality: N/A;   IRRIGATION AND DEBRIDEMENT HEMATOMA Left 03/07/2018   Procedure: IRRIGATION AND DEBRIDEMENT HEMATOMA-LEFT BREAST;  Surgeon: Earline Mayotte, MD;  Location: [facility] ORS;  Service: General;  Laterality: Left;   LUNG BIOPSY  2011   RESECTION RIBS EXTRAPLEURAL  10258    Medical History: Past Medical History:  Diagnosis Date   Anemia    Anxiety    Asthma    Breast fibrocystic disorder 2013   Edema 09/15/2017   Hernia 2011   chest wall   Iron deficiency anemia 09/15/2017   Lung mass 2011   Sarcoidosis    Shortness of breath    2011   Tachycardia, unspecified 09/15/2017    Family History: Family History  Problem Relation Age of Onset   Liver cancer Mother 62   Bone cancer Maternal Aunt 60   Breast cancer Maternal Aunt    Breast cancer Paternal Aunt    Kidney cancer Father    Bone cancer Paternal Aunt 14    Social History   Socioeconomic History   Marital status: Divorced    Spouse name: Not on file   Number of children: Not on file   Years of education: Not on file   Highest education level: Not on file  Occupational History   Not on file  Tobacco Use   Smoking status: Never   Smokeless tobacco: Never  Vaping Use   Vaping Use: Never used  Substance and Sexual Activity   Alcohol use: Yes    Comment: on occassion   Drug use: No   Sexual activity: Not on file  Other Topics Concern   Not on file  Social History Narrative   Not on file   Social Determinants of Health   Financial Resource Strain: Not on file  Food Insecurity: Not on file  Transportation Needs: Not on file  Physical Activity: Not on file  Stress: Not on file  Social Connections: Not on file  Intimate Partner Violence: Not on file       Review of Systems  Constitutional:  Negative for chills, fatigue and unexpected weight change.  HENT:  Negative for congestion, postnasal drip, rhinorrhea, sneezing and sore throat.   Eyes:  Negative for redness.  Respiratory:  Negative for cough, chest tightness and shortness of breath.   Cardiovascular:  Negative for chest pain and palpitations.  Gastrointestinal:  Negative for abdominal pain, constipation, diarrhea, nausea and vomiting.  Genitourinary:  Negative for dysuria and frequency.  Musculoskeletal:  Positive for arthralgias. Negative for back pain, joint swelling and neck pain.  Skin:  Negative for rash.  Neurological: Negative.  Negative for tremors and numbness.  Hematological:  Negative for adenopathy. Does not bruise/bleed easily.  Psychiatric/Behavioral:  Negative for behavioral problems (Depression), sleep disturbance and suicidal ideas. The patient is not nervous/anxious.    Vital Signs: BP (!) 122/92   Pulse 82   Temp (!) 97.2 F (36.2 C)   Resp 16   Ht 5\' 6"  (1.676 m)   Wt 161 lb 9.6 oz (73.3 kg)   SpO2 98%   BMI 26.08 kg/m    Physical Exam Constitutional:      Appearance: Normal appearance.  HENT:     Head: Normocephalic and atraumatic.     Nose: Nose normal.     Mouth/Throat:     Mouth: Mucous membranes are moist.     Pharynx: No posterior oropharyngeal erythema.  Eyes:     Extraocular Movements: Extraocular movements intact.     Pupils: Pupils are equal, round, and reactive to light.  Cardiovascular:     Pulses: Normal pulses.     Heart sounds: Normal heart sounds.  Pulmonary:     Effort: Pulmonary effort is normal.     Breath sounds: Normal breath sounds.  Neurological:     General: No focal deficit present.     Mental Status: She is alert.  Psychiatric:        Mood and Affect: Mood normal.        Behavior: Behavior normal.       Assessment/Plan: 1. Sarcoidosis of lung (HCC) Will change Inhalers  - pneumococcal 20-Val Conj  Vacc (PREVNAR 20) 0.5 ML injection; Inject 0.5 mLs into the muscle tomorrow at 10 am for 1 dose.  Dispense: 0.5 mL; Refill: 0 - fluticasone-salmeterol (ADVAIR) 100-50 MCG/ACT AEPB; Inhale 1 puff into the lungs 2 (two) times daily.  Dispense: 1 each; Refill: 3  2. Diffuse arthralgia Per Rheumatology   3. Benign hypertension Improved BP control    General Counseling: Anet verbalizes understanding of the findings of todays visit and agrees with plan of treatment. I have discussed any further diagnostic evaluation that may be needed or ordered today. We also reviewed her medications today. she has been encouraged to call the office with any questions or concerns that should arise related to todays visit.    No orders of the defined types were placed in this encounter.   Meds ordered this encounter  Medications   pneumococcal 20-Val Conj Vacc (PREVNAR 20) 0.5 ML injection    Sig: Inject 0.5 mLs into the muscle tomorrow at 10 am for 1 dose.    Dispense:  0.5 mL    Refill:  0   fluticasone-salmeterol (ADVAIR) 100-50 MCG/ACT AEPB    Sig: Inhale 1 puff into the lungs 2 (two) times daily.    Dispense:  1 each    Refill:  3    Total time spent:30 Minutes Time spent includes review of chart, medications, test results, and follow up plan with the patient.   [place] Controlled Substance Database was reviewed by me.   Dr Lyndon Code Internal medicine

## 2021-04-30 LAB — CALPROTECTIN, FECAL: Calprotectin, Fecal: 41 ug/g (ref 0–120)

## 2021-05-18 ENCOUNTER — Telehealth: Payer: Self-pay | Admitting: Gastroenterology

## 2021-05-18 NOTE — Telephone Encounter (Signed)
Pt. Requesting call back. She said the blood test that was ordered, her insurance will not pay for it. She is requesting a call back she said there needs to be an explanation to why she needs blood work sent to her insurance

## 2021-05-18 NOTE — Telephone Encounter (Signed)
Patient states the stool test was covered by her insurance but she got a bill from [facility] for 400 dollars. Come to figure out the hematologist had order that blood work and wanted her to repeat labs. Gave her the number to call them and she will call them and ask them about it

## 2021-05-24 ENCOUNTER — Inpatient Hospital Stay: Payer: Managed Care, Other (non HMO) | Attending: Oncology

## 2021-05-25 ENCOUNTER — Telehealth: Payer: 59 | Admitting: Oncology

## 2021-05-25 ENCOUNTER — Other Ambulatory Visit: Payer: 59

## 2021-05-27 ENCOUNTER — Inpatient Hospital Stay: Payer: Managed Care, Other (non HMO) | Admitting: Oncology

## 2021-06-02 ENCOUNTER — Encounter: Payer: Managed Care, Other (non HMO) | Admitting: Nurse Practitioner

## 2021-06-19 ENCOUNTER — Other Ambulatory Visit: Payer: Self-pay | Admitting: Internal Medicine

## 2021-06-19 DIAGNOSIS — L309 Dermatitis, unspecified: Secondary | ICD-10-CM

## 2021-06-25 ENCOUNTER — Other Ambulatory Visit: Payer: Self-pay | Admitting: Nurse Practitioner

## 2021-06-25 DIAGNOSIS — K219 Gastro-esophageal reflux disease without esophagitis: Secondary | ICD-10-CM

## 2021-07-12 ENCOUNTER — Ambulatory Visit: Payer: Managed Care, Other (non HMO) | Admitting: Gastroenterology

## 2021-07-19 ENCOUNTER — Ambulatory Visit: Payer: Managed Care, Other (non HMO) | Admitting: Gastroenterology

## 2021-08-02 ENCOUNTER — Other Ambulatory Visit: Payer: Self-pay | Admitting: Internal Medicine

## 2021-08-06 ENCOUNTER — Telehealth: Payer: Self-pay

## 2021-08-06 NOTE — Telephone Encounter (Signed)
Sent mychart message to confirm 08/09/21 appointment-Brittany Huynh

## 2021-08-09 ENCOUNTER — Encounter: Payer: Self-pay | Admitting: Nurse Practitioner

## 2021-08-09 ENCOUNTER — Other Ambulatory Visit: Payer: Self-pay

## 2021-08-09 ENCOUNTER — Telehealth: Payer: Self-pay

## 2021-08-09 ENCOUNTER — Ambulatory Visit (INDEPENDENT_AMBULATORY_CARE_PROVIDER_SITE_OTHER): Payer: Managed Care, Other (non HMO) | Admitting: Nurse Practitioner

## 2021-08-09 VITALS — BP 130/78 | HR 68 | Temp 98.1°F | Resp 16 | Ht 66.0 in | Wt 167.2 lb

## 2021-08-09 DIAGNOSIS — Z1231 Encounter for screening mammogram for malignant neoplasm of breast: Secondary | ICD-10-CM

## 2021-08-09 DIAGNOSIS — I10 Essential (primary) hypertension: Secondary | ICD-10-CM

## 2021-08-09 DIAGNOSIS — M359 Systemic involvement of connective tissue, unspecified: Secondary | ICD-10-CM

## 2021-08-09 DIAGNOSIS — D860 Sarcoidosis of lung: Secondary | ICD-10-CM

## 2021-08-09 DIAGNOSIS — G3184 Mild cognitive impairment, so stated: Secondary | ICD-10-CM | POA: Diagnosis not present

## 2021-08-09 DIAGNOSIS — R300 Dysuria: Secondary | ICD-10-CM | POA: Diagnosis not present

## 2021-08-09 DIAGNOSIS — M2550 Pain in unspecified joint: Secondary | ICD-10-CM

## 2021-08-09 DIAGNOSIS — G4700 Insomnia, unspecified: Secondary | ICD-10-CM

## 2021-08-09 DIAGNOSIS — Z0001 Encounter for general adult medical examination with abnormal findings: Secondary | ICD-10-CM

## 2021-08-09 DIAGNOSIS — L309 Dermatitis, unspecified: Secondary | ICD-10-CM

## 2021-08-09 MED ORDER — VALACYCLOVIR HCL 500 MG PO TABS: 500.0000 mg | ORAL_TABLET | Freq: Two times a day (BID) | ORAL | 0 refills | Status: AC

## 2021-08-09 MED ORDER — PREDNISONE 5 MG PO TABS: 10.0000 mg | ORAL_TABLET | Freq: Every day | ORAL | 2 refills | Status: DC

## 2021-08-09 MED ORDER — FLUTICASONE-SALMETEROL 100-50 MCG/ACT IN AEPB: 1.0000 | INHALATION_SPRAY | Freq: Two times a day (BID) | RESPIRATORY_TRACT | 3 refills | Status: DC

## 2021-08-09 MED ORDER — MIRTAZAPINE 30 MG PO TABS: 30.0000 mg | ORAL_TABLET | Freq: Every day | ORAL | 1 refills | Status: DC

## 2021-08-09 MED ORDER — ALBUTEROL SULFATE HFA 108 (90 BASE) MCG/ACT IN AERS
2.0000 | INHALATION_SPRAY | Freq: Four times a day (QID) | RESPIRATORY_TRACT | 3 refills | Status: DC | PRN
Start: 2021-08-09 — End: 2023-03-13

## 2021-08-09 MED ORDER — ZOSTER VAC RECOMB ADJUVANTED 50 MCG/0.5ML IM SUSR: 0.5000 mL | Freq: Once | INTRAMUSCULAR | 0 refills | Status: AC

## 2021-08-09 MED ORDER — ZOLPIDEM TARTRATE 10 MG PO TABS: 10.0000 mg | ORAL_TABLET | Freq: Every evening | ORAL | 2 refills | Status: DC | PRN

## 2021-08-09 MED ORDER — HYDROXYCHLOROQUINE SULFATE 200 MG PO TABS: 200.0000 mg | ORAL_TABLET | Freq: Two times a day (BID) | ORAL | 1 refills | Status: DC

## 2021-08-09 MED ORDER — DICYCLOMINE HCL 20 MG PO TABS: 20.0000 mg | ORAL_TABLET | Freq: Three times a day (TID) | ORAL | 2 refills | Status: DC

## 2021-08-09 MED ORDER — BETAMETHASONE DIPROPIONATE AUG 0.05 % EX OINT: TOPICAL_OINTMENT | Freq: Two times a day (BID) | CUTANEOUS | 1 refills | Status: DC

## 2021-08-09 MED ORDER — AZATHIOPRINE 50 MG PO TABS: 50.0000 mg | ORAL_TABLET | Freq: Every day | ORAL | 1 refills | Status: DC

## 2021-08-09 MED ORDER — BUSPIRONE HCL 10 MG PO TABS: 10.0000 mg | ORAL_TABLET | Freq: Two times a day (BID) | ORAL | 2 refills | Status: DC

## 2021-08-09 MED ORDER — BISOPROLOL-HYDROCHLOROTHIAZIDE 10-6.25 MG PO TABS: ORAL_TABLET | ORAL | 1 refills | Status: DC

## 2021-08-09 MED ORDER — AMLODIPINE BESYLATE 2.5 MG PO TABS: 2.5000 mg | ORAL_TABLET | Freq: Every day | ORAL | 1 refills | Status: DC

## 2021-08-09 NOTE — Telephone Encounter (Signed)
Neurology referral sent via [facility] to [facility]-Toni 

## 2021-08-09 NOTE — Progress Notes (Signed)
[facility] [place], [place] [address]  Internal MEDICINE  Office Visit Note  Patient Name: Brittany Huynh  [id number]  [id number]  Date of Service: 08/09/2021  Chief Complaint  Patient presents with   Annual Exam    Legs feel tingly, feet burn a lot, cold all the time, discuss meds, forgetful, discuss dementia, feels anxiety when she doesn't have someone with her    HPI Brittany Huynh presents for an annual well visit and physical exam.  She is a well-appearing 55 year old female.  She reports that she has a tingling feeling in her legs and her feet tend to burn a lot and she also reports feeling cold all the time.  She is concerned that she may have dementia as it runs in her family.  She reports that she has been increasingly forgetful and forgets where she puts stuff at home more than usual and forgets conversations she had within 5 to 10 minutes after having them.  She is also having increased anxiety and stores and cannot go into large department stores without her daughter going with her.  She has sarcoidosis of the lung and reports increasing shortness of breath she has not had a recent chest x-ray. She was seen by rheumatology in early fall and did not go for a follow-up visit.  She reports that she did not know she needed a follow-up visit.  The office visit note recommends a 32-month follow-up visit around mid October.  She was started on new medication called azathioprine but did not have refills so she is not sure if it helped.  She has continued to take hydroxychloroquine as prescribed. She was seen by gastroenterology and had a endoscopy procedure performed.  She has been taking dicyclomine 10 mg tablet which she reports has not been helping with the abdominal pain.       Current Medication: Outpatient Encounter Medications as of 08/09/2021  Medication Sig Note   acetaminophen (TYLENOL) 500 MG tablet Take 500 mg by mouth every 6 (six) hours as needed.  09/29/2020: Can take 1 or 2 at a time   aspirin EC 81 MG EC tablet Take 1 tablet (81 mg total) by mouth daily. Swallow whole.    busPIRone (BUSPAR) 10 MG tablet Take 1 tablet (10 mg total) by mouth 2 (two) times daily.    celecoxib (CELEBREX) 200 MG capsule Take 1 capsule (200 mg total) by mouth daily.    dicyclomine (BENTYL) 20 MG tablet Take 1 tablet (20 mg total) by mouth 4 (four) times daily -  before meals and at bedtime.    DULoxetine (CYMBALTA) 60 MG capsule TAKE 1 CAPSULE BY MOUTH EVERY DAY    valACYclovir (VALTREX) 500 MG tablet Take 1 tablet (500 mg total) by mouth 2 (two) times daily for 5 days.    [DISCONTINUED] albuterol (VENTOLIN HFA) 108 (90 Base) MCG/ACT inhaler Inhale 2 puffs into the lungs every 6 (six) hours as needed for wheezing or shortness of breath.    [DISCONTINUED] amLODipine (NORVASC) 2.5 MG tablet Take by mouth.    [DISCONTINUED] augmented betamethasone dipropionate (DIPROLENE-AF) 0.05 % ointment APPLY TOPICALLY TWICE A DAY    [DISCONTINUED] azaTHIOprine (IMURAN) 50 MG tablet Take 1 tablet by mouth daily.    [DISCONTINUED] bisoprolol-hydrochlorothiazide (ZIAC) 10-6.25 MG tablet TAKE 1 TABLET BY MOUTH EVERY DAY IN THE MORNING FOR BLOOD PRESSURE    [DISCONTINUED] dicyclomine (BENTYL) 10 MG capsule TAKE 1 CAPSULE BY MOUTH EVERY DAY    [DISCONTINUED] fluticasone-salmeterol (ADVAIR)  100-50 MCG/ACT AEPB Inhale 1 puff into the lungs 2 (two) times daily.    [DISCONTINUED] hydroxychloroquine (PLAQUENIL) 200 MG tablet Take 1 tablet (200 mg total) by mouth 2 (two) times daily.    [DISCONTINUED] mirtazapine (REMERON) 30 MG tablet Take 1 tablet (30 mg total) by mouth at bedtime.    [DISCONTINUED] predniSONE (DELTASONE) 5 MG tablet Take 1 tablet (5 mg total) by mouth daily with breakfast.    [DISCONTINUED] Zoster Vaccine Adjuvanted [facility]) injection Inject 0.5 mLs into the muscle once.    albuterol (VENTOLIN HFA) 108 (90 Base) MCG/ACT inhaler Inhale 2 puffs into the lungs every 6  (six) hours as needed for wheezing or shortness of breath.    amLODipine (NORVASC) 2.5 MG tablet Take 1 tablet (2.5 mg total) by mouth daily.    augmented betamethasone dipropionate (DIPROLENE-AF) 0.05 % ointment Apply topically 2 (two) times daily. Until resolved    azaTHIOprine (IMURAN) 50 MG tablet Take 1 tablet (50 mg total) by mouth daily.    bisoprolol-hydrochlorothiazide (ZIAC) 10-6.25 MG tablet TAKE 1 TABLET BY MOUTH EVERY DAY IN THE MORNING FOR BLOOD PRESSURE    fluticasone-salmeterol (ADVAIR) 100-50 MCG/ACT AEPB Inhale 1 puff into the lungs 2 (two) times daily.    hydroxychloroquine (PLAQUENIL) 200 MG tablet Take 1 tablet (200 mg total) by mouth 2 (two) times daily.    mirtazapine (REMERON) 30 MG tablet Take 1 tablet (30 mg total) by mouth at bedtime.    predniSONE (DELTASONE) 5 MG tablet Take 2 tablets (10 mg total) by mouth daily with breakfast.    zolpidem (AMBIEN) 10 MG tablet Take 1 tablet (10 mg total) by mouth at bedtime as needed for sleep.    [EXPIRED] Zoster Vaccine Adjuvanted [facility]) injection Inject 0.5 mLs into the muscle once for 1 dose.    [DISCONTINUED] zolpidem (AMBIEN) 10 MG tablet Take 1 tablet (10 mg total) by mouth at bedtime as needed for sleep.    No facility-administered encounter medications on file as of 08/09/2021.    Surgical History: Past Surgical History:  Procedure Laterality Date   ABDOMINAL HYSTERECTOMY  2009   BREAST BIOPSY Left 02/15/2018   PREDOMINANTLY MATURE ADIPOSE TISSUE WITH VERY FOCAL AREAS OF BENIGN    BREAST EXCISIONAL BIOPSY     chamberlain procedure   2011   COLONOSCOPY WITH PROPOFOL N/A 09/02/2020   Procedure: COLONOSCOPY WITH PROPOFOL;  Surgeon: Lin Landsman, MD;  Location: [facility] ENDOSCOPY;  Service: Gastroenterology;  Laterality: N/A;   ESOPHAGOGASTRODUODENOSCOPY (EGD) WITH PROPOFOL N/A 09/02/2020   Procedure: ESOPHAGOGASTRODUODENOSCOPY (EGD) WITH PROPOFOL;  Surgeon: Lin Landsman, MD;  Location: [facility] ENDOSCOPY;   Service: Gastroenterology;  Laterality: N/A;   IRRIGATION AND DEBRIDEMENT HEMATOMA Left 03/07/2018   Procedure: IRRIGATION AND DEBRIDEMENT HEMATOMA-LEFT BREAST;  Surgeon: Robert Bellow, MD;  Location: [facility] ORS;  Service: General;  Laterality: Left;   LUNG BIOPSY  2011   RESECTION RIBS EXTRAPLEURAL  [id number]    Medical History: Past Medical History:  Diagnosis Date   Anemia    Anxiety    Asthma    Breast fibrocystic disorder 2013   Edema 09/15/2017   Hernia 2011   chest wall   Iron deficiency anemia 09/15/2017   Lung mass 2011   Sarcoidosis    Shortness of breath    2011   Tachycardia, unspecified 09/15/2017    Family History: Family History  Problem Relation Age of Onset   Liver cancer Mother 33   Bone cancer Maternal Aunt 11   Breast cancer  Maternal Aunt    Breast cancer Paternal Aunt    Kidney cancer Father    Bone cancer Paternal Aunt 72    Social History   Socioeconomic History   Marital status: Divorced    Spouse name: Not on file   Number of children: Not on file   Years of education: Not on file   Highest education level: Not on file  Occupational History   Not on file  Tobacco Use   Smoking status: Never   Smokeless tobacco: Never  Vaping Use   Vaping Use: Never used  Substance and Sexual Activity   Alcohol use: Yes    Comment: on occassion   Drug use: No   Sexual activity: Not on file  Other Topics Concern   Not on file  Social History Narrative   Not on file   Social Determinants of Health   Financial Resource Strain: Not on file  Food Insecurity: Not on file  Transportation Needs: Not on file  Physical Activity: Not on file  Stress: Not on file  Social Connections: Not on file  Intimate Partner Violence: Not on file      Review of Systems  Constitutional:  Negative for activity change, appetite change, chills, fatigue, fever and unexpected weight change.  HENT: Negative.  Negative for congestion, ear pain, rhinorrhea, sore throat  and trouble swallowing.   Eyes: Negative.   Respiratory:  Positive for chest tightness and shortness of breath. Negative for cough and wheezing.   Cardiovascular: Negative.  Negative for chest pain.  Gastrointestinal: Negative.  Negative for abdominal pain, blood in stool, constipation, diarrhea, nausea and vomiting.  Endocrine: Negative.   Genitourinary: Negative.  Negative for difficulty urinating, dysuria, frequency, hematuria and urgency.  Musculoskeletal: Negative.  Negative for arthralgias, back pain, joint swelling, myalgias and neck pain.  Skin: Negative.  Negative for rash and wound.  Allergic/Immunologic: Negative.  Negative for immunocompromised state.  Neurological: Negative.  Negative for dizziness, seizures, numbness and headaches.  Hematological: Negative.   Psychiatric/Behavioral: Negative.  Negative for behavioral problems, self-injury and suicidal ideas. The patient is not nervous/anxious.    Vital Signs: BP 130/78   Pulse 68   Temp 98.1 F (36.7 C)   Resp 16   Ht 5\' 6"  (1.676 m)   Wt 167 lb 3.2 oz (75.8 kg)   SpO2 100%   BMI 26.99 kg/m    Physical Exam Constitutional:      General: She is awake. She is not in acute distress.    Appearance: Normal appearance. She is well-developed, well-groomed and overweight. She is not diaphoretic.  HENT:     Head: Normocephalic and atraumatic.     Right Ear: Tympanic membrane, ear canal and external ear normal.     Left Ear: Tympanic membrane, ear canal and external ear normal.     Nose: Nose normal.     Mouth/Throat:     Lips: Pink.     Mouth: Mucous membranes are moist.     Pharynx: Oropharynx is clear. Uvula midline. No oropharyngeal exudate or posterior oropharyngeal erythema.  Eyes:     General: Lids are normal. Vision grossly intact. Gaze aligned appropriately. No scleral icterus.       Right eye: No discharge.        Left eye: No discharge.     Conjunctiva/sclera: Conjunctivae normal.     Pupils: Pupils are  equal, round, and reactive to light.     Funduscopic exam:    Right eye:  Red reflex present.        Left eye: Red reflex present. Neck:     Thyroid: No thyromegaly.     Vascular: No JVD.     Trachea: Trachea and phonation normal. No tracheal deviation.  Cardiovascular:     Rate and Rhythm: Normal rate and regular rhythm.     Pulses: Normal pulses.     Heart sounds: Normal heart sounds, S1 normal and S2 normal. No murmur heard.   No friction rub. No gallop.  Pulmonary:     Effort: Pulmonary effort is normal. No accessory muscle usage or respiratory distress.     Breath sounds: Normal breath sounds and air entry. No stridor. No wheezing or rales.  Chest:     Chest wall: No tenderness.     Comments: Declined clinical breast exam Abdominal:     General: Bowel sounds are normal. There is no distension.     Palpations: Abdomen is soft. There is no shifting dullness, fluid wave, mass or pulsatile mass.     Tenderness: There is no abdominal tenderness. There is no guarding or rebound.  Musculoskeletal:        General: No tenderness or deformity. Normal range of motion.     Cervical back: Normal range of motion and neck supple.  Lymphadenopathy:     Cervical: No cervical adenopathy.  Skin:    General: Skin is warm and dry.     Capillary Refill: Capillary refill takes less than 2 seconds.     Coloration: Skin is not pale.     Findings: No erythema or rash.  Neurological:     Mental Status: She is alert and oriented to person, place, and time.     Cranial Nerves: No cranial nerve deficit.     Motor: No abnormal muscle tone.     Coordination: Coordination normal.     Gait: Gait normal.     Deep Tendon Reflexes: Reflexes are normal and symmetric.  Psychiatric:        Mood and Affect: Affect normal. Mood is anxious.        Behavior: Behavior normal. Behavior is cooperative.        Thought Content: Thought content normal.        Cognition and Memory: Memory is impaired.        Judgment:  Judgment normal.       Assessment/Plan: 1. Encounter for routine adult health examination with abnormal findings Age-appropriate preventive screenings and vaccinations discussed, annual physical exam completed. Routine labs for health maintenance previously drawn and results have already been discussed. PHM updated.    2. Mild cognitive impairment Requesting referral  to neurology, order placed.  - Ambulatory referral to Neurology  3. Sarcoidosis of lung (HCC) Continue current treatment, no recent chest xray, xray ordered, patient aware.  - fluticasone-salmeterol (ADVAIR) 100-50 MCG/ACT AEPB; Inhale 1 puff into the lungs 2 (two) times daily.  Dispense: 1 each; Refill: 3 - albuterol (VENTOLIN HFA) 108 (90 Base) MCG/ACT inhaler; Inhale 2 puffs into the lungs every 6 (six) hours as needed for wheezing or shortness of breath.  Dispense: 18 g; Refill: 3 - predniSONE (DELTASONE) 5 MG tablet; Take 2 tablets (10 mg total) by mouth daily with breakfast.  Dispense: 60 tablet; Refill: 2 - DG Chest 2 View; Future  4. Diffuse arthralgia Followed by rheumatology, was supposed to have a follow up with them in October but patint was not aware and did not schedule another appointment. Paitent encouraged to call  their office to schedule a follow up appt.   5. Essential hypertension, benign Stable, blood pressure well controlled, continue medication as prescribed.  - bisoprolol-hydrochlorothiazide (ZIAC) 10-6.25 MG tablet; TAKE 1 TABLET BY MOUTH EVERY DAY IN THE MORNING FOR BLOOD PRESSURE  Dispense: 90 tablet; Refill: 1  6. Insomnia, unspecified type Takes ambien and mirtazapine, continue as prescribed.  - zolpidem (AMBIEN) 10 MG tablet; Take 1 tablet (10 mg total) by mouth at bedtime as needed for sleep.  Dispense: 30 tablet; Refill: 2 - mirtazapine (REMERON) 30 MG tablet; Take 1 tablet (30 mg total) by mouth at bedtime.  Dispense: 90 tablet; Refill: 1  7. Connective tissue disease, undifferentiated  ([place]) Continue hydroxychloroquine as prescribed.  - hydroxychloroquine (PLAQUENIL) 200 MG tablet; Take 1 tablet (200 mg total) by mouth 2 (two) times daily.  Dispense: 180 tablet; Refill: 1  8. Eczema, unspecified type Refill ordered - augmented betamethasone dipropionate (DIPROLENE-AF) 0.05 % ointment; Apply topically 2 (two) times daily. Until resolved  Dispense: 45 g; Refill: 1  9. Dysuria Routine urinalysis done - UA/M w/rflx Culture, Routine  10. Encounter for screening mammogram for malignant neoplasm of breast Routine mammogram ordered.  - MM 3D SCREEN BREAST BILATERAL; Future      General Counseling: cherine pierman understanding of the findings of todays visit and agrees with plan of treatment. I have discussed any further diagnostic evaluation that may be needed or ordered today. We also reviewed her medications today. she has been encouraged to call the office with any questions or concerns that should arise related to todays visit.    Orders Placed This Encounter  Procedures   Microscopic Examination   MM 3D SCREEN BREAST BILATERAL   DG Chest 2 View   UA/M w/rflx Culture, Routine   Ambulatory referral to Neurology    Meds ordered this encounter  Medications   Zoster Vaccine Adjuvanted [facility]) injection    Sig: Inject 0.5 mLs into the muscle once for 1 dose.    Dispense:  0.5 mL    Refill:  0   azaTHIOprine (IMURAN) 50 MG tablet    Sig: Take 1 tablet (50 mg total) by mouth daily.    Dispense:  30 tablet    Refill:  1    Patient sees rheumatology and will schedule an appointment prior to getting anymore refills   bisoprolol-hydrochlorothiazide (ZIAC) 10-6.25 MG tablet    Sig: TAKE 1 TABLET BY MOUTH EVERY DAY IN THE MORNING FOR BLOOD PRESSURE    Dispense:  90 tablet    Refill:  1   amLODipine (NORVASC) 2.5 MG tablet    Sig: Take 1 tablet (2.5 mg total) by mouth daily.    Dispense:  90 tablet    Refill:  1   dicyclomine (BENTYL) 20 MG tablet    Sig:  Take 1 tablet (20 mg total) by mouth 4 (four) times daily -  before meals and at bedtime.    Dispense:  120 tablet    Refill:  2   fluticasone-salmeterol (ADVAIR) 100-50 MCG/ACT AEPB    Sig: Inhale 1 puff into the lungs 2 (two) times daily.    Dispense:  1 each    Refill:  3   zolpidem (AMBIEN) 10 MG tablet    Sig: Take 1 tablet (10 mg total) by mouth at bedtime as needed for sleep.    Dispense:  30 tablet    Refill:  2   albuterol (VENTOLIN HFA) 108 (90 Base) MCG/ACT inhaler  Sig: Inhale 2 puffs into the lungs every 6 (six) hours as needed for wheezing or shortness of breath.    Dispense:  18 g    Refill:  3   hydroxychloroquine (PLAQUENIL) 200 MG tablet    Sig: Take 1 tablet (200 mg total) by mouth 2 (two) times daily.    Dispense:  180 tablet    Refill:  1   mirtazapine (REMERON) 30 MG tablet    Sig: Take 1 tablet (30 mg total) by mouth at bedtime.    Dispense:  90 tablet    Refill:  1   predniSONE (DELTASONE) 5 MG tablet    Sig: Take 2 tablets (10 mg total) by mouth daily with breakfast.    Dispense:  60 tablet    Refill:  2   valACYclovir (VALTREX) 500 MG tablet    Sig: Take 1 tablet (500 mg total) by mouth 2 (two) times daily for 5 days.    Dispense:  10 tablet    Refill:  0   augmented betamethasone dipropionate (DIPROLENE-AF) 0.05 % ointment    Sig: Apply topically 2 (two) times daily. Until resolved    Dispense:  45 g    Refill:  1   busPIRone (BUSPAR) 10 MG tablet    Sig: Take 1 tablet (10 mg total) by mouth 2 (two) times daily.    Dispense:  60 tablet    Refill:  2    Return in about 1 month (around 09/09/2021) for F/U, eval new med, anxiety med refill, Chene Kasinger PCP.   Total time spent:30 Minutes Time spent includes review of chart, medications, test results, and follow up plan with the patient.   [place] Controlled Substance Database was reviewed by me.  This patient was seen by Jonetta Osgood, FNP-C in collaboration with Dr. Clayborn Bigness as a part of  collaborative care agreement.  Nickalas Mccarrick R. Valetta Fuller, MSN, FNP-C Internal medicine

## 2021-08-09 NOTE — Patient Instructions (Addendum)
Please call your rheumatologist's office to schedule a follow up since they wanted to see you in october.

## 2021-08-10 LAB — UA/M W/RFLX CULTURE, ROUTINE
Bilirubin, UA: NEGATIVE
Glucose, UA: NEGATIVE
Leukocytes,UA: NEGATIVE
Nitrite, UA: NEGATIVE
RBC, UA: NEGATIVE
Specific Gravity, UA: 1.029 (ref 1.005–1.030)
Urobilinogen, Ur: 1 mg/dL (ref 0.2–1.0)
pH, UA: 5.5 (ref 5.0–7.5)

## 2021-08-10 LAB — MICROSCOPIC EXAMINATION
Bacteria, UA: NONE SEEN
Casts: NONE SEEN /lpf
RBC, Urine: NONE SEEN /hpf (ref 0–2)

## 2021-08-12 NOTE — Telephone Encounter (Signed)
Neuro scheduled for 10/04/21 @ 11:15-Toni

## 2021-08-18 ENCOUNTER — Encounter: Payer: Self-pay | Admitting: Nurse Practitioner

## 2021-09-06 ENCOUNTER — Other Ambulatory Visit: Payer: Self-pay

## 2021-09-06 ENCOUNTER — Ambulatory Visit
Admission: RE | Admit: 2021-09-06 | Discharge: 2021-09-06 | Disposition: A | Discharge status: Home / Self Care | Payer: Managed Care, Other (non HMO) | Source: Ambulatory Visit | Attending: Nurse Practitioner | Admitting: Nurse Practitioner

## 2021-09-06 ENCOUNTER — Ambulatory Visit
Admission: RE | Admit: 2021-09-06 | Discharge: 2021-09-06 | Disposition: A | Discharge status: Home / Self Care | Payer: Managed Care, Other (non HMO) | Source: Home / Self Care | Attending: Nurse Practitioner | Admitting: Nurse Practitioner

## 2021-09-06 DIAGNOSIS — D860 Sarcoidosis of lung: Secondary | ICD-10-CM | POA: Diagnosis not present

## 2021-09-06 DIAGNOSIS — Z1231 Encounter for screening mammogram for malignant neoplasm of breast: Secondary | ICD-10-CM | POA: Insufficient documentation

## 2021-09-08 ENCOUNTER — Telehealth: Payer: Self-pay

## 2021-09-08 NOTE — Telephone Encounter (Signed)
-----   Message from [name], NP sent at 09/08/2021  4:03 AM EST ----- Please let patient know that her chest xray and mammogram were normal ----- Message ----- From: Interface, Rad Results In Sent: 09/06/2021   1:26 PM EST To: [name], NP

## 2021-09-09 ENCOUNTER — Other Ambulatory Visit: Payer: Self-pay | Admitting: Nurse Practitioner

## 2021-09-13 ENCOUNTER — Other Ambulatory Visit: Payer: Self-pay

## 2021-09-13 ENCOUNTER — Ambulatory Visit: Payer: Managed Care, Other (non HMO) | Admitting: Nurse Practitioner

## 2021-09-13 ENCOUNTER — Encounter: Payer: Self-pay | Admitting: Nurse Practitioner

## 2021-09-13 VITALS — BP 140/90 | HR 80 | Temp 98.4°F | Resp 16 | Ht 66.0 in | Wt 169.4 lb

## 2021-09-13 DIAGNOSIS — J0110 Acute frontal sinusitis, unspecified: Secondary | ICD-10-CM | POA: Diagnosis not present

## 2021-09-13 DIAGNOSIS — F411 Generalized anxiety disorder: Secondary | ICD-10-CM | POA: Diagnosis not present

## 2021-09-13 DIAGNOSIS — K580 Irritable bowel syndrome with diarrhea: Secondary | ICD-10-CM | POA: Diagnosis not present

## 2021-09-13 DIAGNOSIS — B3731 Acute candidiasis of vulva and vagina: Secondary | ICD-10-CM

## 2021-09-13 MED ORDER — FLUCONAZOLE 150 MG PO TABS: 150.0000 mg | ORAL_TABLET | Freq: Once | ORAL | 0 refills | Status: AC

## 2021-09-13 MED ORDER — AMOXICILLIN-POT CLAVULANATE 875-125 MG PO TABS: 1.0000 | ORAL_TABLET | Freq: Two times a day (BID) | ORAL | 0 refills | Status: AC

## 2021-09-13 NOTE — Progress Notes (Signed)
[facility], [place] [address]  Internal MEDICINE  Office Visit Note  Patient Name: Brittany Huynh  [id number]  [id number]  Date of Service: 09/13/2021  Chief Complaint  Patient presents with   Follow-up    Some congestion and phlegm, started last week   Medication Refill   Anemia   Asthma   Anxiety    HPI Chau presents for a follow up visit for symptoms of sinusitis. At her previous visit, she was started on buspirone to decrease her anxiety which is helping. She was also started on dicyclomine for abdominal cramping related to IBS symptoms which has been alleviating the abdominal pain.  She has congestion and phlegm that started bothering her last week. She also reports that she she has a cough, sinus pressure, sore throat.     Current Medication: Outpatient Encounter Medications as of 09/13/2021  Medication Sig Note   acetaminophen (TYLENOL) 500 MG tablet Take 500 mg by mouth every 6 (six) hours as needed. 09/29/2020: Can take 1 or 2 at a time   albuterol (VENTOLIN HFA) 108 (90 Base) MCG/ACT inhaler Inhale 2 puffs into the lungs every 6 (six) hours as needed for wheezing or shortness of breath.    amLODipine (NORVASC) 2.5 MG tablet Take 1 tablet (2.5 mg total) by mouth daily.    [EXPIRED] amoxicillin-clavulanate (AUGMENTIN) 875-125 MG tablet Take 1 tablet by mouth 2 (two) times daily for 10 days. Take with food.    aspirin EC 81 MG EC tablet Take 1 tablet (81 mg total) by mouth daily. Swallow whole.    augmented betamethasone dipropionate (DIPROLENE-AF) 0.05 % ointment Apply topically 2 (two) times daily. Until resolved    azaTHIOprine (IMURAN) 50 MG tablet Take 1 tablet (50 mg total) by mouth daily.    bisoprolol-hydrochlorothiazide (ZIAC) 10-6.25 MG tablet TAKE 1 TABLET BY MOUTH EVERY DAY IN THE MORNING FOR BLOOD PRESSURE    busPIRone (BUSPAR) 10 MG tablet TAKE 1 TABLET BY MOUTH TWICE A DAY    celecoxib (CELEBREX) 200 MG capsule Take 1  capsule (200 mg total) by mouth daily.    dicyclomine (BENTYL) 20 MG tablet Take 1 tablet (20 mg total) by mouth 4 (four) times daily -  before meals and at bedtime.    DULoxetine (CYMBALTA) 60 MG capsule TAKE 1 CAPSULE BY MOUTH EVERY DAY    [EXPIRED] fluconazole (DIFLUCAN) 150 MG tablet Take 1 tablet (150 mg total) by mouth once for 1 dose. May take an additional dose after 3 days if still symptomatic.    fluticasone-salmeterol (ADVAIR) 100-50 MCG/ACT AEPB Inhale 1 puff into the lungs 2 (two) times daily.    hydroxychloroquine (PLAQUENIL) 200 MG tablet Take 1 tablet (200 mg total) by mouth 2 (two) times daily.    mirtazapine (REMERON) 30 MG tablet Take 1 tablet (30 mg total) by mouth at bedtime.    predniSONE (DELTASONE) 5 MG tablet Take 2 tablets (10 mg total) by mouth daily with breakfast.    zolpidem (AMBIEN) 10 MG tablet Take 1 tablet (10 mg total) by mouth at bedtime as needed for sleep.    No facility-administered encounter medications on file as of 09/13/2021.    Surgical History: Past Surgical History:  Procedure Laterality Date   ABDOMINAL HYSTERECTOMY  2009   BREAST BIOPSY Left 02/15/2018   PREDOMINANTLY MATURE ADIPOSE TISSUE WITH VERY FOCAL AREAS OF BENIGN    BREAST EXCISIONAL BIOPSY     chamberlain procedure   2011   COLONOSCOPY  WITH PROPOFOL N/A 09/02/2020   Procedure: COLONOSCOPY WITH PROPOFOL;  Surgeon: Lin Landsman, MD;  Location: [facility] ENDOSCOPY;  Service: Gastroenterology;  Laterality: N/A;   ESOPHAGOGASTRODUODENOSCOPY (EGD) WITH PROPOFOL N/A 09/02/2020   Procedure: ESOPHAGOGASTRODUODENOSCOPY (EGD) WITH PROPOFOL;  Surgeon: Lin Landsman, MD;  Location: [facility] ENDOSCOPY;  Service: Gastroenterology;  Laterality: N/A;   IRRIGATION AND DEBRIDEMENT HEMATOMA Left 03/07/2018   Procedure: IRRIGATION AND DEBRIDEMENT HEMATOMA-LEFT BREAST;  Surgeon: Robert Bellow, MD;  Location: [facility] ORS;  Service: General;  Laterality: Left;   LUNG BIOPSY  2011   RESECTION RIBS  EXTRAPLEURAL  [id number]    Medical History: Past Medical History:  Diagnosis Date   Anemia    Anxiety    Asthma    Breast fibrocystic disorder 2013   Edema 09/15/2017   Hernia 2011   chest wall   Iron deficiency anemia 09/15/2017   Lung mass 2011   Sarcoidosis    Shortness of breath    2011   Tachycardia, unspecified 09/15/2017    Family History: Family History  Problem Relation Age of Onset   Liver cancer Mother 76   Bone cancer Maternal Aunt 60   Breast cancer Maternal Aunt    Breast cancer Paternal Aunt    Kidney cancer Father    Bone cancer Paternal Aunt 20    Social History   Socioeconomic History   Marital status: Divorced    Spouse name: Not on file   Number of children: Not on file   Years of education: Not on file   Highest education level: Not on file  Occupational History   Not on file  Tobacco Use   Smoking status: Never   Smokeless tobacco: Never  Vaping Use   Vaping Use: Never used  Substance and Sexual Activity   Alcohol use: Yes    Comment: on occassion   Drug use: No   Sexual activity: Not on file  Other Topics Concern   Not on file  Social History Narrative   Not on file   Social Determinants of Health   Financial Resource Strain: Not on file  Food Insecurity: Not on file  Transportation Needs: Not on file  Physical Activity: Not on file  Stress: Not on file  Social Connections: Not on file  Intimate Partner Violence: Not on file      Review of Systems  Constitutional:  Positive for fatigue. Negative for chills and fever.  HENT:  Positive for congestion, ear pain, rhinorrhea, sinus pressure, sinus pain and sneezing. Negative for sore throat.   Eyes:  Positive for visual disturbance.  Respiratory:  Positive for shortness of breath (only when laying down). Negative for cough, chest tightness and wheezing.   Cardiovascular: Negative.  Negative for chest pain and palpitations.  Gastrointestinal: Negative.  Negative for diarrhea,  nausea and vomiting.  Skin:  Negative for rash.  Neurological:  Positive for headaches. Negative for dizziness and light-headedness.   Vital Signs: BP 140/90    Pulse 80    Temp 98.4 F (36.9 C)    Resp 16    Ht 5\' 6"  (1.676 m)    Wt 169 lb 6.4 oz (76.8 kg)    SpO2 99%    BMI 27.34 kg/m    Physical Exam Vitals reviewed.  Constitutional:      General: She is not in acute distress.    Appearance: Normal appearance. She is not ill-appearing.  HENT:     Head: Normocephalic and atraumatic.  Right Ear: Tympanic membrane, ear canal and external ear normal.     Left Ear: Tympanic membrane, ear canal and external ear normal.     Nose: Congestion and rhinorrhea present.     Mouth/Throat:     Mouth: Mucous membranes are moist.     Pharynx: Posterior oropharyngeal erythema present. No oropharyngeal exudate.  Eyes:     Pupils: Pupils are equal, round, and reactive to light.  Cardiovascular:     Rate and Rhythm: Normal rate and regular rhythm.  Pulmonary:     Effort: Pulmonary effort is normal. No respiratory distress.  Neurological:     Mental Status: She is alert and oriented to person, place, and time.     Cranial Nerves: No cranial nerve deficit.     Coordination: Coordination normal.     Gait: Gait normal.  Psychiatric:        Mood and Affect: Mood normal.        Behavior: Behavior normal.       Assessment/Plan: 1. Acute non-recurrent frontal sinusitis Empiric antibiotic treatment prescribed.  - amoxicillin-clavulanate (AUGMENTIN) 875-125 MG tablet; Take 1 tablet by mouth 2 (two) times daily for 10 days. Take with food.  Dispense: 20 tablet; Refill: 0  2. Vulvovaginal candidiasis Patient is prone to yeast infections when she takes antibiotics, fluconazole prescribed.  - fluconazole (DIFLUCAN) 150 MG tablet; Take 1 tablet (150 mg total) by mouth once for 1 dose. May take an additional dose after 3 days if still symptomatic.  Dispense: 4 tablet; Refill: 0  3. Irritable  bowel syndrome with diarrhea Dicyclomine  is helping with abdominal pain.   4. Generalized anxiety disorder Buspirone is helping, continue as prescribed.    General Counseling: gloristine kosta understanding of the findings of todays visit and agrees with plan of treatment. I have discussed any further diagnostic evaluation that may be needed or ordered today. We also reviewed her medications today. she has been encouraged to call the office with any questions or concerns that should arise related to todays visit.    No orders of the defined types were placed in this encounter.   Meds ordered this encounter  Medications   amoxicillin-clavulanate (AUGMENTIN) 875-125 MG tablet    Sig: Take 1 tablet by mouth 2 (two) times daily for 10 days. Take with food.    Dispense:  20 tablet    Refill:  0   fluconazole (DIFLUCAN) 150 MG tablet    Sig: Take 1 tablet (150 mg total) by mouth once for 1 dose. May take an additional dose after 3 days if still symptomatic.    Dispense:  4 tablet    Refill:  0    Return in about 2 months (around 11/11/2021) for F/U, med refill, Isair Inabinet PCP ambien.   Total time spent:30 Minutes Time spent includes review of chart, medications, test results, and follow up plan with the patient.   [place] Controlled Substance Database was reviewed by me.  This patient was seen by Jonetta Osgood, FNP-C in collaboration with Dr. Clayborn Bigness as a part of collaborative care agreement.   Sallyanne Birkhead R. Valetta Fuller, MSN, FNP-C Internal medicine

## 2021-10-28 ENCOUNTER — Encounter: Payer: Self-pay | Admitting: Nurse Practitioner

## 2021-10-28 ENCOUNTER — Other Ambulatory Visit: Payer: Self-pay

## 2021-10-28 ENCOUNTER — Ambulatory Visit: Payer: Managed Care, Other (non HMO) | Admitting: Nurse Practitioner

## 2021-10-28 VITALS — BP 122/88 | HR 75 | Temp 98.3°F | Resp 16 | Ht 66.0 in | Wt 173.6 lb

## 2021-10-28 DIAGNOSIS — R109 Unspecified abdominal pain: Secondary | ICD-10-CM | POA: Diagnosis not present

## 2021-10-28 DIAGNOSIS — R140 Abdominal distension (gaseous): Secondary | ICD-10-CM

## 2021-10-28 DIAGNOSIS — E538 Deficiency of other specified B group vitamins: Secondary | ICD-10-CM

## 2021-10-28 DIAGNOSIS — G4700 Insomnia, unspecified: Secondary | ICD-10-CM

## 2021-10-28 MED ORDER — CYANOCOBALAMIN 1000 MCG/ML IJ SOLN
1000.0000 ug | Freq: Once | INTRAMUSCULAR | Status: AC
  Administered 2021-10-28: 1000 ug via INTRAMUSCULAR

## 2021-10-28 MED ORDER — METOCLOPRAMIDE HCL 5 MG PO TABS: 5.0000 mg | ORAL_TABLET | Freq: Three times a day (TID) | ORAL | 1 refills | Status: DC

## 2021-10-28 MED ORDER — ZOLPIDEM TARTRATE 10 MG PO TABS: 10.0000 mg | ORAL_TABLET | Freq: Every evening | ORAL | 2 refills | Status: DC | PRN

## 2021-10-28 NOTE — Progress Notes (Signed)
[facility] ?[address] ?[place], [place] [address] ? ?Internal MEDICINE  ?Office Visit Note ? ?Patient Name: Brittany CannyKathy E Huynh ? [id number]  [id number] ? ?Date of Service: 10/28/2021 ? ?Chief Complaint  ?Patient presents with  ? Acute Visit  ?  Bloated, gas, on and off for a while but this week has been worse, diet has not been changed   ? ? ? ?HPI ?Brittany MessierKathy presents for an acute sick visit for abdominal bloating and cramping.  She states that yesterday she was so bloated she looked like she was pregnant with twins.  She denies having any nausea, blood in stool, black tarry stools, constipation.  Reports having diarrhea 5-6 times yesterday reports that only 2 of her bowel movements were loose and That the bloating occurs regardless of whether she has eaten or not. she has tried Gas-X but that has not helped.  She reports that the bloating problem has been going on for about a year but has gotten worse over the past couple of days.  She reports that she cannot eat any dairy due to having GI symptoms.  She has never been tested for food allergies or celiac disease.  She has been tested for H. pylori in the past but it was negative. ? ? ?Current Medication: ? ?Outpatient Encounter Medications as of 10/28/2021  ?Medication Sig Note  ? acetaminophen (TYLENOL) 500 MG tablet Take 500 mg by mouth every 6 (six) hours as needed. 09/29/2020: Can take 1 or 2 at a time  ? albuterol (VENTOLIN HFA) 108 (90 Base) MCG/ACT inhaler Inhale 2 puffs into the lungs every 6 (six) hours as needed for wheezing or shortness of breath.   ? amLODipine (NORVASC) 2.5 MG tablet Take 1 tablet (2.5 mg total) by mouth daily.   ? aspirin EC 81 MG EC tablet Take 1 tablet (81 mg total) by mouth daily. Swallow whole.   ? augmented betamethasone dipropionate (DIPROLENE-AF) 0.05 % ointment Apply topically 2 (two) times daily. Until resolved   ? azaTHIOprine (IMURAN) 50 MG tablet Take 1 tablet (50 mg total) by mouth daily.   ?  bisoprolol-hydrochlorothiazide (ZIAC) 10-6.25 MG tablet TAKE 1 TABLET BY MOUTH EVERY DAY IN THE MORNING FOR BLOOD PRESSURE   ? busPIRone (BUSPAR) 10 MG tablet TAKE 1 TABLET BY MOUTH TWICE A DAY   ? celecoxib (CELEBREX) 200 MG capsule Take 1 capsule (200 mg total) by mouth daily.   ? clonazePAM (KLONOPIN) 0.5 MG tablet Take 1/2 tab once a day for a week then increase to twice a day   ? dicyclomine (BENTYL) 20 MG tablet Take 1 tablet (20 mg total) by mouth 4 (four) times daily -  before meals and at bedtime.   ? DULoxetine (CYMBALTA) 60 MG capsule TAKE 1 CAPSULE BY MOUTH EVERY DAY   ? fluticasone-salmeterol (ADVAIR) 100-50 MCG/ACT AEPB Inhale 1 puff into the lungs 2 (two) times daily.   ? hydroxychloroquine (PLAQUENIL) 200 MG tablet Take 1 tablet (200 mg total) by mouth 2 (two) times daily.   ? metoCLOPramide (REGLAN) 5 MG tablet Take 1 tablet (5 mg total) by mouth 4 (four) times daily -  before meals and at bedtime.   ? mirtazapine (REMERON) 30 MG tablet Take 1 tablet (30 mg total) by mouth at bedtime.   ? predniSONE (DELTASONE) 5 MG tablet Take 2 tablets (10 mg total) by mouth daily with breakfast.   ? [DISCONTINUED] zolpidem (AMBIEN) 10 MG tablet Take 1 tablet (10 mg total) by mouth at  bedtime as needed for sleep.   ? zolpidem (AMBIEN) 10 MG tablet Take 1 tablet (10 mg total) by mouth at bedtime as needed for sleep.   ? ?Facility-Administered Encounter Medications as of 10/28/2021  ?Medication  ? cyanocobalamin ((VITAMIN B-12)) injection 1,000 mcg  ? ? ? ? ?Medical History: ?Past Medical History:  ?Diagnosis Date  ? Anemia   ? Anxiety   ? Asthma   ? Breast fibrocystic disorder 2013  ? Edema 09/15/2017  ? Hernia 2011  ? chest wall  ? Iron deficiency anemia 09/15/2017  ? Lung mass 2011  ? Sarcoidosis   ? Shortness of breath   ? 2011  ? Tachycardia, unspecified 09/15/2017  ? ? ? ?Vital Signs: ?BP 122/88   Pulse 75   Temp 98.3 ?F (36.8 ?C)   Resp 16   Ht 5\' 6"  (1.676 m)   Wt 173 lb 9.6 oz (78.7 kg)   SpO2 97%   BMI  28.02 kg/m?  ? ? ?Review of Systems  ?Constitutional:  Positive for appetite change. Negative for chills, fatigue and unexpected weight change.  ?HENT:  Negative for congestion, rhinorrhea, sneezing and sore throat.   ?Eyes:  Negative for redness.  ?Respiratory: Negative.  Negative for cough, chest tightness, shortness of breath and wheezing.   ?Cardiovascular: Negative.  Negative for chest pain and palpitations.  ?Gastrointestinal:  Positive for abdominal distention, abdominal pain and diarrhea. Negative for blood in stool, constipation, nausea and vomiting.  ?Genitourinary:  Negative for dysuria and frequency.  ?Musculoskeletal:  Negative for arthralgias, back pain, joint swelling and neck pain.  ?Skin:  Negative for rash.  ?Neurological: Negative.  Negative for tremors and numbness.  ?Hematological:  Negative for adenopathy. Does not bruise/bleed easily.  ?Psychiatric/Behavioral:  Negative for behavioral problems (Depression), sleep disturbance and suicidal ideas. The patient is not nervous/anxious.   ? ?Physical Exam ?Vitals reviewed.  ?Constitutional:   ?   General: She is not in acute distress. ?   Appearance: Normal appearance. She is not ill-appearing.  ?HENT:  ?   Head: Normocephalic and atraumatic.  ?Eyes:  ?   Pupils: Pupils are equal, round, and reactive to light.  ?Cardiovascular:  ?   Rate and Rhythm: Normal rate and regular rhythm.  ?Pulmonary:  ?   Effort: Pulmonary effort is normal. No respiratory distress.  ?Neurological:  ?   Mental Status: She is alert and oriented to person, place, and time.  ?Psychiatric:     ?   Mood and Affect: Mood normal.     ?   Behavior: Behavior normal.  ? ? ? ? ?Assessment/Plan: ?1. Abdominal bloating ?Rule out celiac disease, food allergies, H. pylori infection.  May take metoclopramide 1 tablet before meals and at bedtime to improve gastric motility and decrease bloating. ?- Celiac Disease Panel ?- IgE + Allergens (22) ?- H Pylori, IGM, IGG, IGA AB ?- metoCLOPramide  (REGLAN) 5 MG tablet; Take 1 tablet (5 mg total) by mouth 4 (four) times daily -  before meals and at bedtime.  Dispense: 120 tablet; Refill: 1 ? ?2. Abdominal cramping ?Rule out celiac disease, food allergies, and H. pylori infection. ?- Celiac Disease Panel ?- IgE + Allergens (22) ?- H Pylori, IGM, IGG, IGA AB ? ?3. Insomnia, unspecified type ?Stable, refills ordered. ?- zolpidem (AMBIEN) 10 MG tablet; Take 1 tablet (10 mg total) by mouth at bedtime as needed for sleep.  Dispense: 30 tablet; Refill: 2 ? ?4. B12 deficiency ?B12 injection administered in office today, may  repeat in 1 month, patient instructed to schedule a nurse visit. ?- cyanocobalamin ((VITAMIN B-12)) injection 1,000 mcg ? ? ?General Counseling: jourden delmont understanding of the findings of todays visit and agrees with plan of treatment. I have discussed any further diagnostic evaluation that may be needed or ordered today. We also reviewed her medications today. she has been encouraged to call the office with any questions or concerns that should arise related to todays visit. ? ? ? ?Counseling: ? ? ? ?Orders Placed This Encounter  ?Procedures  ? Celiac Disease Panel  ? IgE + Allergens (22)  ? H Pylori, IGM, IGG, IGA AB  ? ? ?Meds ordered this encounter  ?Medications  ? metoCLOPramide (REGLAN) 5 MG tablet  ?  Sig: Take 1 tablet (5 mg total) by mouth 4 (four) times daily -  before meals and at bedtime.  ?  Dispense:  120 tablet  ?  Refill:  1  ? zolpidem (AMBIEN) 10 MG tablet  ?  Sig: Take 1 tablet (10 mg total) by mouth at bedtime as needed for sleep.  ?  Dispense:  30 tablet  ?  Refill:  2  ? cyanocobalamin ((VITAMIN B-12)) injection 1,000 mcg  ? ? ?Return in about 4 weeks (around 11/25/2021) for F/U, Labs, Wessley Emert PCP. ? ?[place] Controlled Substance Database was reviewed by me for overdose risk score (ORS) ? ?Time spent:30 Minutes ?Time spent with patient included reviewing progress notes, labs, imaging studies, and discussing plan for follow up.   ? ?This patient was seen by [name], FNP-C in collaboration with Dr. Beverely Risen as a part of collaborative care agreement. ? ?Thula Stewart R. Tedd Sias, MSN, FNP-C ?Internal Medicine ?

## 2021-11-08 ENCOUNTER — Ambulatory Visit: Payer: Managed Care, Other (non HMO) | Admitting: Nurse Practitioner

## 2021-11-09 ENCOUNTER — Encounter: Payer: Self-pay | Admitting: Internal Medicine

## 2021-11-09 ENCOUNTER — Telehealth: Payer: Self-pay

## 2021-11-09 ENCOUNTER — Other Ambulatory Visit: Payer: Self-pay | Admitting: Nurse Practitioner

## 2021-11-09 DIAGNOSIS — G4710 Hypersomnia, unspecified: Secondary | ICD-10-CM

## 2021-11-09 DIAGNOSIS — G3184 Mild cognitive impairment, so stated: Secondary | ICD-10-CM

## 2021-11-09 DIAGNOSIS — G2581 Restless legs syndrome: Secondary | ICD-10-CM

## 2021-11-09 DIAGNOSIS — R109 Unspecified abdominal pain: Secondary | ICD-10-CM

## 2021-11-10 ENCOUNTER — Other Ambulatory Visit: Payer: Self-pay

## 2021-11-10 ENCOUNTER — Encounter: Payer: Self-pay | Admitting: Nurse Practitioner

## 2021-11-10 ENCOUNTER — Other Ambulatory Visit
Admission: RE | Admit: 2021-11-10 | Discharge: 2021-11-10 | Disposition: A | Discharge status: Home / Self Care | Payer: Managed Care, Other (non HMO) | Attending: Nurse Practitioner | Admitting: Nurse Practitioner

## 2021-11-10 DIAGNOSIS — R109 Unspecified abdominal pain: Secondary | ICD-10-CM | POA: Insufficient documentation

## 2021-11-10 DIAGNOSIS — Z91018 Allergy to other foods: Secondary | ICD-10-CM

## 2021-11-10 DIAGNOSIS — R140 Abdominal distension (gaseous): Secondary | ICD-10-CM | POA: Diagnosis not present

## 2021-11-12 ENCOUNTER — Other Ambulatory Visit: Payer: Self-pay | Admitting: Nurse Practitioner

## 2021-11-12 LAB — CELIAC DISEASE PANEL
Endomysial Ab, IgA: NEGATIVE
IgA: 145 mg/dL (ref 87–352)
Tissue Transglutaminase Ab, IgA: <2 U/mL (ref 0–3)

## 2021-11-12 LAB — H PYLORI, IGM, IGG, IGA AB
H Pylori IgG: 0.1 Index Value (ref 0.00–0.79)
H. Pylogi, Iga Abs: <9 units (ref 0.0–8.9)
H. Pylogi, Igm Abs: <9 units (ref 0.0–8.9)

## 2021-11-12 NOTE — Telephone Encounter (Signed)
error 

## 2021-11-13 LAB — ALLERGEN PANEL (27) + IGE
Alternaria Alternata IgE: <0.1 kU/L
Aspergillus Fumigatus IgE: <0.1 kU/L
Bahia Grass IgE: <0.1 kU/L
Bermuda Grass IgE: <0.1 kU/L
Cat Dander IgE: <0.1 kU/L
Cedar, Mountain IgE: <0.1 kU/L
Cladosporium Herbarum IgE: <0.1 kU/L
Cocklebur IgE: <0.1 kU/L
Cockroach, American IgE: <0.1 kU/L
Common Silver Birch IgE: <0.1 kU/L
D Farinae IgE: <0.1 kU/L
D Pteronyssinus IgE: <0.1 kU/L
Dog Dander IgE: <0.1 kU/L
Elm, American IgE: <0.1 kU/L
Hickory, White IgE: <0.1 kU/L
IgE (Immunoglobulin E), Serum: 3 IU/mL — ABNORMAL LOW (ref 6–495)
Johnson Grass IgE: <0.1 kU/L
Kentucky Bluegrass IgE: <0.1 kU/L
Maple/Box Elder IgE: <0.1 kU/L
Mucor Racemosus IgE: <0.1 kU/L
Oak, White IgE: <0.1 kU/L
Penicillium Chrysogen IgE: <0.1 kU/L
Pigweed, Rough IgE: <0.1 kU/L
Plantain, English IgE: <0.1 kU/L
Ragweed, Short IgE: <0.1 kU/L
Setomelanomma Rostrat: <0.1 kU/L
Timothy Grass IgE: <0.1 kU/L
White Mulberry IgE: <0.1 kU/L

## 2021-11-15 ENCOUNTER — Telehealth: Payer: Self-pay

## 2021-11-15 NOTE — Telephone Encounter (Signed)
Spoke to pt, provided results  

## 2021-11-15 NOTE — Progress Notes (Signed)
Patient is negative for food allergies, celiac panel was negative and H.pylori was negative.

## 2021-11-15 NOTE — Telephone Encounter (Signed)
-----   Message from [name], NP sent at 11/15/2021  6:38 AM EDT ----- ?Patient is negative for food allergies, celiac panel was negative and H.pylori was negative.  ?

## 2021-11-21 ENCOUNTER — Other Ambulatory Visit: Payer: Self-pay | Admitting: Nurse Practitioner

## 2021-11-21 DIAGNOSIS — D860 Sarcoidosis of lung: Secondary | ICD-10-CM

## 2021-11-29 ENCOUNTER — Encounter: Payer: Self-pay | Admitting: Nurse Practitioner

## 2021-11-29 ENCOUNTER — Ambulatory Visit: Payer: BC Managed Care – PPO | Admitting: Nurse Practitioner

## 2021-11-29 ENCOUNTER — Other Ambulatory Visit
Admission: RE | Admit: 2021-11-29 | Discharge: 2021-11-29 | Disposition: A | Discharge status: Home / Self Care | Payer: BC Managed Care – PPO | Source: Ambulatory Visit | Attending: Nurse Practitioner | Admitting: Nurse Practitioner

## 2021-11-29 VITALS — BP 110/80 | HR 85 | Temp 98.3°F | Resp 16 | Ht 66.0 in | Wt 174.2 lb

## 2021-11-29 DIAGNOSIS — R252 Cramp and spasm: Secondary | ICD-10-CM | POA: Insufficient documentation

## 2021-11-29 DIAGNOSIS — E538 Deficiency of other specified B group vitamins: Secondary | ICD-10-CM | POA: Insufficient documentation

## 2021-11-29 DIAGNOSIS — R0789 Other chest pain: Secondary | ICD-10-CM | POA: Insufficient documentation

## 2021-11-29 DIAGNOSIS — R233 Spontaneous ecchymoses: Secondary | ICD-10-CM

## 2021-11-29 DIAGNOSIS — R5383 Other fatigue: Secondary | ICD-10-CM

## 2021-11-29 DIAGNOSIS — R300 Dysuria: Secondary | ICD-10-CM | POA: Diagnosis not present

## 2021-11-29 LAB — BASIC METABOLIC PANEL
Anion gap: 7 (ref 5–15)
BUN: 16 mg/dL (ref 6–20)
CO2: 29 mmol/L (ref 22–32)
Calcium: 9 mg/dL (ref 8.9–10.3)
Chloride: 105 mmol/L (ref 98–111)
Creatinine, Ser: 0.82 mg/dL (ref 0.44–1.00)
GFR, Estimated: >60 mL/min (ref >60)
Glucose, Bld: 111 mg/dL — ABNORMAL HIGH (ref 70–99)
Potassium: 3.4 mmol/L — ABNORMAL LOW (ref 3.5–5.1)
Sodium: 141 mmol/L (ref 135–145)

## 2021-11-29 LAB — POCT URINALYSIS DIPSTICK
Blood, UA: NEGATIVE
Glucose, UA: NEGATIVE
Leukocytes, UA: NEGATIVE
Nitrite, UA: NEGATIVE
Protein, UA: POSITIVE — AB
Spec Grav, UA: >=1.03 — AB (ref 1.010–1.025)
Urobilinogen, UA: 0.2 E.U./dL
pH, UA: 5 (ref 5.0–8.0)

## 2021-11-29 LAB — CBC WITH DIFFERENTIAL/PLATELET
Abs Immature Granulocytes: 0.03 10*3/uL (ref 0.00–0.07)
Basophils Absolute: 0.1 10*3/uL (ref 0.0–0.1)
Basophils Relative: 1 %
Eosinophils Absolute: 0.1 10*3/uL (ref 0.0–0.5)
Eosinophils Relative: 1 %
HCT: 44.6 % (ref 36.0–46.0)
Hemoglobin: 14.1 g/dL (ref 12.0–15.0)
Immature Granulocytes: 1 %
Lymphocytes Relative: 22 %
Lymphs Abs: 1.4 10*3/uL (ref 0.7–4.0)
MCH: 27.5 pg (ref 26.0–34.0)
MCHC: 31.6 g/dL (ref 30.0–36.0)
MCV: 87.1 fL (ref 80.0–100.0)
Monocytes Absolute: 0.4 10*3/uL (ref 0.1–1.0)
Monocytes Relative: 6 %
Neutro Abs: 4.4 10*3/uL (ref 1.7–7.7)
Neutrophils Relative %: 69 %
Platelets: 199 10*3/uL (ref 150–400)
RBC: 5.12 MIL/uL — ABNORMAL HIGH (ref 3.87–5.11)
RDW: 13.4 % (ref 11.5–15.5)
WBC: 6.3 10*3/uL (ref 4.0–10.5)
nRBC: 0 % (ref 0.0–0.2)

## 2021-11-29 LAB — PROTIME-INR
INR: 1 (ref 0.8–1.2)
Prothrombin Time: 12.7 seconds (ref 11.4–15.2)

## 2021-11-29 LAB — IRON AND TIBC
Iron: 77 ug/dL (ref 28–170)
Saturation Ratios: 21 % (ref 10.4–31.8)
TIBC: 363 ug/dL (ref 250–450)
UIBC: 286 ug/dL

## 2021-11-29 LAB — MAGNESIUM: Magnesium: 2.2 mg/dL (ref 1.7–2.4)

## 2021-11-29 LAB — VITAMIN B12: Vitamin B-12: 354 pg/mL (ref 180–914)

## 2021-11-29 LAB — FERRITIN: Ferritin: 18 ng/mL (ref 11–307)

## 2021-11-29 LAB — CK TOTAL AND CKMB (NOT AT ARMC)
CK, MB: 1.5 ng/mL (ref 0.5–5.0)
Relative Index: 1.4 (ref 0.0–2.5)
Total CK: 106 U/L (ref 38–234)

## 2021-11-29 LAB — CK: Total CK: 110 U/L (ref 38–234)

## 2021-11-29 LAB — FOLATE: Folate: 9.8 ng/mL (ref >5.9)

## 2021-11-29 NOTE — Addendum Note (Signed)
Addended by: Annamaria Helling on: 11/29/2021 04:15 PM ? ? Modules accepted: Orders ? ?

## 2021-11-29 NOTE — Progress Notes (Addendum)
[facility] ?[address] ?[place], [place] [address] ? ?Internal MEDICINE  ?Office Visit Note ? ?Patient Name: Brittany Huynh ? [id number]  ?[id number] ? ?Date of Service: 11/29/2021 ? ?Chief Complaint  ?Patient presents with  ? Follow-up  ?  urinary urgency, chest has felt tight recently, pt broke left middle toe in December, foot still aches   ? Anxiety  ? Asthma  ? Anemia  ? Fatigue  ?  Feels drained and tired all the time  ? Dizziness  ? Shortness of Breath  ? ? ?HPI ?Brittany Huynh presents for follow-up visit for fatigue, dizziness, shortness of breath, chest pressure and pain.  Patient reports a persistent chest tightness and pressure and her last EKG was in 2021.  EKG done in office today and was normal sinus rhythm ?Patient is also reporting continued fatigue and feeling tired and low energy constantly.  She has had a history of significant B12 deficiency and has had anemia in the past.  She has not had recent labs regarding these issues.  She reports persistent mild muscle weakness and cramping and her magnesium level was not checked last time she went to the phlebotomist. ?--Going to call neurologist about restless legs.  ? ? ?Current Medication: ?Outpatient Encounter Medications as of 11/29/2021  ?Medication Sig Note  ? acetaminophen (TYLENOL) 500 MG tablet Take 500 mg by mouth every 6 (six) hours as needed. 09/29/2020: Can take 1 or 2 at a time  ? albuterol (VENTOLIN HFA) 108 (90 Base) MCG/ACT inhaler Inhale 2 puffs into the lungs every 6 (six) hours as needed for wheezing or shortness of breath.   ? amLODipine (NORVASC) 2.5 MG tablet Take 1 tablet (2.5 mg total) by mouth daily.   ? aspirin EC 81 MG EC tablet Take 1 tablet (81 mg total) by mouth daily. Swallow whole.   ? augmented betamethasone dipropionate (DIPROLENE-AF) 0.05 % ointment Apply topically 2 (two) times daily. Until resolved   ? azaTHIOprine (IMURAN) 50 MG tablet Take 1 tablet (50 mg total) by mouth daily.   ? bisoprolol-hydrochlorothiazide  (ZIAC) 10-6.25 MG tablet TAKE 1 TABLET BY MOUTH EVERY DAY IN THE MORNING FOR BLOOD PRESSURE   ? busPIRone (BUSPAR) 10 MG tablet TAKE 1 TABLET BY MOUTH TWICE A DAY   ? celecoxib (CELEBREX) 200 MG capsule Take 1 capsule (200 mg total) by mouth daily.   ? clonazePAM (KLONOPIN) 0.5 MG tablet Take 1/2 tab once a day for a week then increase to twice a day   ? dicyclomine (BENTYL) 20 MG tablet TAKE 1 TABLET (20 MG TOTAL) BY MOUTH 4 (FOUR) TIMES DAILY - BEFORE MEALS AND AT BEDTIME.   ? DULoxetine (CYMBALTA) 60 MG capsule TAKE 1 CAPSULE BY MOUTH EVERY DAY   ? fluticasone-salmeterol (ADVAIR) 100-50 MCG/ACT AEPB Inhale 1 puff into the lungs 2 (two) times daily.   ? hydroxychloroquine (PLAQUENIL) 200 MG tablet Take 1 tablet (200 mg total) by mouth 2 (two) times daily.   ? metoCLOPramide (REGLAN) 5 MG tablet Take 1 tablet (5 mg total) by mouth 4 (four) times daily -  before meals and at bedtime.   ? mirtazapine (REMERON) 30 MG tablet Take 1 tablet (30 mg total) by mouth at bedtime.   ? predniSONE (DELTASONE) 5 MG tablet TAKE 2 TABLETS BY MOUTH DAILY WITH BREAKFAST.   ? zolpidem (AMBIEN) 10 MG tablet Take 1 tablet (10 mg total) by mouth at bedtime as needed for sleep.   ? ?No facility-administered encounter medications on file  as of 11/29/2021.  ? ? ?Surgical History: ?Past Surgical History:  ?Procedure Laterality Date  ? ABDOMINAL HYSTERECTOMY  2009  ? BREAST BIOPSY Left 02/15/2018  ? PREDOMINANTLY MATURE ADIPOSE TISSUE WITH VERY FOCAL AREAS OF BENIGN   ? BREAST EXCISIONAL BIOPSY    ? chamberlain procedure   2011  ? COLONOSCOPY WITH PROPOFOL N/A 09/02/2020  ? Procedure: COLONOSCOPY WITH PROPOFOL;  Surgeon: Toney Reil, MD;  Location: [facility] ENDOSCOPY;  Service: Gastroenterology;  Laterality: N/A;  ? ESOPHAGOGASTRODUODENOSCOPY (EGD) WITH PROPOFOL N/A 09/02/2020  ? Procedure: ESOPHAGOGASTRODUODENOSCOPY (EGD) WITH PROPOFOL;  Surgeon: Toney Reil, MD;  Location: [facility] ENDOSCOPY;  Service: Gastroenterology;  Laterality: N/A;   ? IRRIGATION AND DEBRIDEMENT HEMATOMA Left 03/07/2018  ? Procedure: IRRIGATION AND DEBRIDEMENT HEMATOMA-LEFT BREAST;  Surgeon: Earline Mayotte, MD;  Location: [facility] ORS;  Service: General;  Laterality: Left;  ? LUNG BIOPSY  2011  ? RESECTION RIBS EXTRAPLEURAL  [id number]  ? ? ?Medical History: ?Past Medical History:  ?Diagnosis Date  ? Anemia   ? Anxiety   ? Asthma   ? Breast fibrocystic disorder 2013  ? Edema 09/15/2017  ? Hernia 2011  ? chest wall  ? Iron deficiency anemia 09/15/2017  ? Lung mass 2011  ? Sarcoidosis   ? Shortness of breath   ? 2011  ? Tachycardia, unspecified 09/15/2017  ? ? ?Family History: ?Family History  ?Problem Relation Age of Onset  ? Liver cancer Mother 76  ? Bone cancer Maternal Aunt 60  ? Breast cancer Maternal Aunt   ? Breast cancer Paternal Aunt   ? Kidney cancer Father   ? Bone cancer Paternal Aunt 64  ? ? ?Social History  ? ?Socioeconomic History  ? Marital status: Divorced  ?  Spouse name: Not on file  ? Number of children: Not on file  ? Years of education: Not on file  ? Highest education level: Not on file  ?Occupational History  ? Not on file  ?Tobacco Use  ? Smoking status: Never  ? Smokeless tobacco: Never  ?Vaping Use  ? Vaping Use: Never used  ?Substance and Sexual Activity  ? Alcohol use: Yes  ?  Comment: on occassion  ? Drug use: No  ? Sexual activity: Not on file  ?Other Topics Concern  ? Not on file  ?Social History Narrative  ? Not on file  ? ?Social Determinants of Health  ? ?Financial Resource Strain: Not on file  ?Food Insecurity: Not on file  ?Transportation Needs: Not on file  ?Physical Activity: Not on file  ?Stress: Not on file  ?Social Connections: Not on file  ?Intimate Partner Violence: Not on file  ? ? ? ? ?Review of Systems  ?Constitutional:  Positive for activity change (low energy), appetite change and fatigue. Negative for chills and unexpected weight change.  ?HENT:  Negative for congestion, rhinorrhea, sneezing and sore throat.   ?Eyes:  Negative for redness.   ?Respiratory: Negative.  Negative for cough, chest tightness, shortness of breath and wheezing.   ?Cardiovascular:  Positive for chest pain. Negative for palpitations.  ?Gastrointestinal:  Negative for abdominal pain, constipation, diarrhea, nausea and vomiting.  ?Genitourinary:  Negative for dysuria and frequency.  ?Musculoskeletal:  Positive for arthralgias, back pain and myalgias. Negative for joint swelling and neck pain.  ?Skin: Negative.  Negative for rash.  ?Neurological:  Positive for dizziness, weakness and light-headedness. Negative for tremors and numbness.  ?Hematological:  Negative for adenopathy. Bruises/bleeds easily.  ?Psychiatric/Behavioral:  Negative for behavioral problems (  Depression), sleep disturbance and suicidal ideas. The patient is not nervous/anxious.   ? ?Vital Signs: ?BP 110/80   Pulse 85   Temp 98.3 ?F (36.8 ?C)   Resp 16   Ht 5\' 6"  (1.676 m)   Wt 174 lb 3.2 oz (79 kg)   SpO2 98%   BMI 28.12 kg/m?  ? ? ?Physical Exam ?Vitals reviewed.  ?Constitutional:   ?   General: She is not in acute distress. ?   Appearance: Normal appearance. She is well-developed. She is not ill-appearing.  ?HENT:  ?   Head: Normocephalic and atraumatic.  ?Eyes:  ?   Pupils: Pupils are equal, round, and reactive to light.  ?Cardiovascular:  ?   Rate and Rhythm: Normal rate and regular rhythm.  ?   Heart sounds: No murmur heard. ?Pulmonary:  ?   Effort: Pulmonary effort is normal. No respiratory distress.  ?Neurological:  ?   Mental Status: She is alert and oriented to person, place, and time.  ?Psychiatric:     ?   Mood and Affect: Mood normal.     ?   Behavior: Behavior normal.  ? ? ? ? ? ?Assessment/Plan: ?1. Atypical chest pain ?EKG done in office, normal sinus rhythm, within normal limits. ?- EKG 12-Lead ? ?2. Other fatigue ?Additional labs ordered for further evaluation of symptoms.  ?- Magnesium ?- Basic Metabolic Panel (BMET) ?- B12 and Folate Panel ?- Iron, TIBC and Ferritin Panel ?- CBC with  Differential/Platelet ?- CK Total (and CKMB) ?- INR/PT ? ?3. B12 deficiency ?Additional labs ordered ?- Magnesium ?- Basic Metabolic Panel (BMET) ?- B12 and Folate Panel ?- Iron, TIBC and Ferritin Panel ?- CBC w

## 2021-12-03 LAB — CULTURE, URINE COMPREHENSIVE

## 2021-12-23 NOTE — Progress Notes (Signed)
Please call patient and let her know her results: ?-- Her potassium level is still slightly low but has improved some, eating foods rich in potassium may help.  Her kidney function is normal.  Her calcium level also remains normal ?--CBC is normal except for very slightly elevated RBC ?--Total CK and CK-MB are both normal ?--Vitamin B12 normal is low normal, continue at least oral supplement but may get B12 injections if patient desires ?--Her magnesium level is normal ?--Her folate level is normal ?--Her iron panel is normal and her ferritin is borderline low normal ?--PT/INR normal

## 2021-12-24 ENCOUNTER — Telehealth: Payer: Self-pay

## 2021-12-24 NOTE — Telephone Encounter (Signed)
-----   Message from Jonetta Osgood, NP sent at 12/23/2021  1:13 PM EDT ----- ?Please call patient and let her know her results: ?-- Her potassium level is still slightly low but has improved some, eating foods rich in potassium may help.  Her kidney function is normal.  Her calcium level also remains normal ?--CBC is normal except for very slightly elevated RBC ?--Total CK and CK-MB are both normal ?--Vitamin B12 normal is low normal, continue at least oral supplement but may get B12 injections if patient desires ?--Her magnesium level is normal ?--Her folate level is normal ?--Her iron panel is normal and her ferritin is borderline low normal ?--PT/INR normal ?

## 2021-12-30 ENCOUNTER — Encounter: Payer: Self-pay | Admitting: Physician Assistant

## 2021-12-30 ENCOUNTER — Ambulatory Visit: Payer: BC Managed Care – PPO | Admitting: Physician Assistant

## 2021-12-30 VITALS — BP 151/85 | HR 75 | Temp 98.1°F | Resp 16 | Ht 66.0 in | Wt 177.8 lb

## 2021-12-30 DIAGNOSIS — R0602 Shortness of breath: Secondary | ICD-10-CM

## 2021-12-30 DIAGNOSIS — R079 Chest pain, unspecified: Secondary | ICD-10-CM | POA: Diagnosis not present

## 2021-12-30 DIAGNOSIS — I10 Essential (primary) hypertension: Secondary | ICD-10-CM

## 2021-12-30 DIAGNOSIS — N644 Mastodynia: Secondary | ICD-10-CM

## 2021-12-30 MED ORDER — CLONAZEPAM 0.5 MG PO TABS: ORAL_TABLET | ORAL | 0 refills | Status: DC

## 2021-12-30 NOTE — Progress Notes (Signed)
[facility] [address] [place], [place] [address]  Internal MEDICINE  Office Visit Note  Patient Name: Brittany Huynh  [id number]  [id number]  Date of Service: 01/05/2022  Chief Complaint  Patient presents with   Chest Pain    Having chest pain, pain under breast, sometimes SOB - been 1 week     HPI Pt is here for a sick visit. -Some CP intermittently for the past week.  Feels it in left breast and radiates toward right side. Some pain in left arm.  -No inciting trigger of pain that she is aware of. Can occur at rest, Goes away after a few minutes. Denies noticing increased anxiety prior but cannot rule it out as she does get anxious and is on medication for this. -Pain is also reproducible with tenderness of left axilla and both breasts. Denies any strenuous activity. Discussed reproducible pain is reassuring however since she has pain at rest at times and SOB will need further workup. Will order diagnostic mammogram with [place] due to breast pain and will also pursue echo for further evaluation of heart/SOB. -base don previous labs and some numbness and tingling she does want to move forward with b12 injections.  -Dr. Malvin Johns was prescribing klonopin, has not had follow up and lack of this medication may be leading to some increased anxiety/poor sleep--will send single refill while she reschedules her missed appt -BP elevated in office and will have her increase to  amlodipine  Current Medication:  Outpatient Encounter Medications as of 12/30/2021  Medication Sig Note   acetaminophen (TYLENOL) 500 MG tablet Take 500 mg by mouth every 6 (six) hours as needed. 09/29/2020: Can take 1 or 2 at a time   albuterol (VENTOLIN HFA) 108 (90 Base) MCG/ACT inhaler Inhale 2 puffs into the lungs every 6 (six) hours as needed for wheezing or shortness of breath.    amLODipine (NORVASC) 2.5 MG tablet Take 1 tablet (2.5 mg total) by mouth daily.    aspirin EC 81 MG EC tablet Take 1  tablet (81 mg total) by mouth daily. Swallow whole.    augmented betamethasone dipropionate (DIPROLENE-AF) 0.05 % ointment Apply topically 2 (two) times daily. Until resolved    azaTHIOprine (IMURAN) 50 MG tablet Take 1 tablet (50 mg total) by mouth daily.    bisoprolol-hydrochlorothiazide (ZIAC) 10-6.25 MG tablet TAKE 1 TABLET BY MOUTH EVERY DAY IN THE MORNING FOR BLOOD PRESSURE    busPIRone (BUSPAR) 10 MG tablet TAKE 1 TABLET BY MOUTH TWICE A DAY    celecoxib (CELEBREX) 200 MG capsule Take 1 capsule (200 mg total) by mouth daily.    dicyclomine (BENTYL) 20 MG tablet TAKE 1 TABLET (20 MG TOTAL) BY MOUTH 4 (FOUR) TIMES DAILY - BEFORE MEALS AND AT BEDTIME.    DULoxetine (CYMBALTA) 60 MG capsule TAKE 1 CAPSULE BY MOUTH EVERY DAY    fluticasone-salmeterol (ADVAIR) 100-50 MCG/ACT AEPB Inhale 1 puff into the lungs 2 (two) times daily.    hydroxychloroquine (PLAQUENIL) 200 MG tablet Take 1 tablet (200 mg total) by mouth 2 (two) times daily.    mirtazapine (REMERON) 30 MG tablet Take 1 tablet (30 mg total) by mouth at bedtime.    predniSONE (DELTASONE) 5 MG tablet TAKE 2 TABLETS BY MOUTH DAILY WITH BREAKFAST.    zolpidem (AMBIEN) 10 MG tablet Take 1 tablet (10 mg total) by mouth at bedtime as needed for sleep.    [DISCONTINUED] clonazePAM (KLONOPIN) 0.5 MG tablet Take 1/2 tab once a day  for a week then increase to twice a day    [DISCONTINUED] metoCLOPramide (REGLAN) 5 MG tablet Take 1 tablet (5 mg total) by mouth 4 (four) times daily -  before meals and at bedtime.    clonazePAM (KLONOPIN) 0.5 MG tablet Take 1/2 tab once a day for a week then increase to twice a day    No facility-administered encounter medications on file as of 12/30/2021.      Medical History: Past Medical History:  Diagnosis Date   Anemia    Anxiety    Asthma    Breast fibrocystic disorder 2013   Edema 09/15/2017   Hernia 2011   chest wall   Iron deficiency anemia 09/15/2017   Lung mass 2011   Sarcoidosis    Shortness of  breath    2011   Tachycardia, unspecified 09/15/2017     Vital Signs: BP (!) 151/85   Pulse 75   Temp 98.1 F (36.7 C)   Resp 16   Ht 5\' 6"  (1.676 m)   Wt 177 lb 12.8 oz (80.6 kg)   SpO2 100%   BMI 28.70 kg/m    Review of Systems  Constitutional:  Negative for fatigue and fever.  HENT:  Negative for congestion, mouth sores and postnasal drip.   Respiratory:  Positive for shortness of breath. Negative for cough and wheezing.   Cardiovascular:  Positive for chest pain. Negative for palpitations and leg swelling.       Chest pain with breast and chest wall tenderness  Genitourinary:  Negative for flank pain.  Psychiatric/Behavioral:  The patient is nervous/anxious.    Physical Exam Vitals and nursing note reviewed.  Constitutional:      General: She is not in acute distress.    Appearance: Normal appearance. She is well-developed. She is not diaphoretic.  HENT:     Head: Normocephalic and atraumatic.     Mouth/Throat:     Pharynx: No oropharyngeal exudate.  Eyes:     Pupils: Pupils are equal, round, and reactive to light.  Neck:     Thyroid: No thyromegaly.     Vascular: No JVD.     Trachea: No tracheal deviation.  Cardiovascular:     Rate and Rhythm: Normal rate and regular rhythm.     Heart sounds: Normal heart sounds. No murmur heard.   No friction rub. No gallop.  Pulmonary:     Effort: Pulmonary effort is normal. No respiratory distress.     Breath sounds: No wheezing or rales.  Chest:     Chest wall: Tenderness present.  Breasts:    Right: Tenderness present. No swelling, bleeding, inverted nipple, mass, nipple discharge or skin change.     Left: Tenderness present. No swelling, bleeding, inverted nipple, mass, nipple discharge or skin change.     Comments: Bilateral breast tenderness and reproducible chest pain on palpation of breast and chest pain with tenderness to left axilla as well Abdominal:     General: Bowel sounds are normal.     Palpations:  Abdomen is soft.  Musculoskeletal:        General: Normal range of motion.     Cervical back: Normal range of motion and neck supple.  Lymphadenopathy:     Cervical: No cervical adenopathy.  Skin:    General: Skin is warm and dry.  Neurological:     Mental Status: She is alert and oriented to person, place, and time.     Cranial Nerves: No cranial nerve deficit.  Psychiatric:        Behavior: Behavior normal.        Thought Content: Thought content normal.        Judgment: Judgment normal.      Assessment/Plan: 1. Shortness of breath - EKG 12-Lead showed NSR with possible left atrial enlargement and inverted T waves. Pt's pain is reproducible and intermittent with resolution on its own for the past week. Will order echo for further evaluation, but pt advised to go to ED if any worsening or new symptoms arise. - ECHOCARDIOGRAM COMPLETE; Future  2. Chest pain, unspecified type May be multifactorial as pain is reproducible however occurs at rest as well, will order echo for further evaluation and pt advised to go to ED if any worsening especially if pain does not resolve on its own after a few minutes as it has been. Continue anxiety medications as prescribed.  3. Breast tenderness in female Due to tenderness of both breasts and left axilla causing reproducible CP will order diagnostic mammogram with [place] for further workup. - MM DIAG BREAST TOMO BILATERAL; Future - US BREAST LTD UNI LEFT INC AXILLA; Future  4. Essential hypertension Elevated in office, will have pt double amlodipine to  and continue to monitor closely.   General Counseling: Brittany Huynh understanding of the findings of todays visit and agrees with plan of treatment. I have discussed any further diagnostic evaluation that may be needed or ordered today. We also reviewed her medications today. she has been encouraged to call the office with any questions or concerns that should arise related to todays  visit.    Counseling:    Orders Placed This Encounter  Procedures   MM DIAG BREAST TOMO BILATERAL   US BREAST LTD UNI LEFT INC AXILLA   EKG 12-Lead   ECHOCARDIOGRAM COMPLETE    Meds ordered this encounter  Medications   clonazePAM (KLONOPIN) 0.5 MG tablet    Sig: Take 1/2 tab once a day for a week then increase to twice a day    Dispense:  30 tablet    Refill:  0    Time spent:40 Minutes

## 2022-01-03 ENCOUNTER — Other Ambulatory Visit: Payer: Self-pay | Admitting: Nurse Practitioner

## 2022-01-03 DIAGNOSIS — R140 Abdominal distension (gaseous): Secondary | ICD-10-CM

## 2022-01-04 ENCOUNTER — Other Ambulatory Visit: Payer: Self-pay | Admitting: Physician Assistant

## 2022-01-04 ENCOUNTER — Other Ambulatory Visit: Payer: Self-pay | Admitting: *Deleted

## 2022-01-04 DIAGNOSIS — N644 Mastodynia: Secondary | ICD-10-CM

## 2022-01-05 ENCOUNTER — Encounter: Payer: Self-pay | Admitting: *Deleted

## 2022-01-05 ENCOUNTER — Observation Stay
Admission: EM | Admit: 2022-01-05 | Discharge: 2022-01-06 | Disposition: A | Discharge status: Home / Self Care | Payer: BC Managed Care – PPO | Attending: Internal Medicine | Admitting: Internal Medicine

## 2022-01-05 ENCOUNTER — Emergency Department: Payer: BC Managed Care – PPO

## 2022-01-05 ENCOUNTER — Other Ambulatory Visit: Payer: Self-pay

## 2022-01-05 DIAGNOSIS — Z79899 Other long term (current) drug therapy: Secondary | ICD-10-CM | POA: Insufficient documentation

## 2022-01-05 DIAGNOSIS — M4802 Spinal stenosis, cervical region: Secondary | ICD-10-CM | POA: Insufficient documentation

## 2022-01-05 DIAGNOSIS — M50223 Other cervical disc displacement at C6-C7 level: Secondary | ICD-10-CM | POA: Diagnosis not present

## 2022-01-05 DIAGNOSIS — K219 Gastro-esophageal reflux disease without esophagitis: Secondary | ICD-10-CM | POA: Diagnosis present

## 2022-01-05 DIAGNOSIS — I10 Essential (primary) hypertension: Secondary | ICD-10-CM | POA: Diagnosis not present

## 2022-01-05 DIAGNOSIS — Z7982 Long term (current) use of aspirin: Secondary | ICD-10-CM | POA: Diagnosis not present

## 2022-01-05 DIAGNOSIS — J45909 Unspecified asthma, uncomplicated: Secondary | ICD-10-CM | POA: Diagnosis not present

## 2022-01-05 DIAGNOSIS — M47812 Spondylosis without myelopathy or radiculopathy, cervical region: Secondary | ICD-10-CM | POA: Diagnosis not present

## 2022-01-05 DIAGNOSIS — M50222 Other cervical disc displacement at C5-C6 level: Secondary | ICD-10-CM | POA: Diagnosis not present

## 2022-01-05 DIAGNOSIS — Z8616 Personal history of COVID-19: Secondary | ICD-10-CM | POA: Diagnosis not present

## 2022-01-05 DIAGNOSIS — R202 Paresthesia of skin: Secondary | ICD-10-CM | POA: Diagnosis not present

## 2022-01-05 DIAGNOSIS — R200 Anesthesia of skin: Secondary | ICD-10-CM | POA: Diagnosis not present

## 2022-01-05 DIAGNOSIS — R29898 Other symptoms and signs involving the musculoskeletal system: Secondary | ICD-10-CM | POA: Diagnosis present

## 2022-01-05 DIAGNOSIS — D860 Sarcoidosis of lung: Secondary | ICD-10-CM | POA: Diagnosis present

## 2022-01-05 DIAGNOSIS — R42 Dizziness and giddiness: Secondary | ICD-10-CM | POA: Insufficient documentation

## 2022-01-05 DIAGNOSIS — R29818 Other symptoms and signs involving the nervous system: Secondary | ICD-10-CM | POA: Diagnosis not present

## 2022-01-05 DIAGNOSIS — F411 Generalized anxiety disorder: Secondary | ICD-10-CM | POA: Diagnosis present

## 2022-01-05 DIAGNOSIS — R531 Weakness: Secondary | ICD-10-CM | POA: Diagnosis not present

## 2022-01-05 LAB — DIFFERENTIAL
Abs Immature Granulocytes: 0.06 10*3/uL (ref 0.00–0.07)
Basophils Absolute: 0 10*3/uL (ref 0.0–0.1)
Basophils Relative: 1 %
Eosinophils Absolute: 0 10*3/uL (ref 0.0–0.5)
Eosinophils Relative: 0 %
Immature Granulocytes: 1 %
Lymphocytes Relative: 19 %
Lymphs Abs: 1.4 10*3/uL (ref 0.7–4.0)
Monocytes Absolute: 0.4 10*3/uL (ref 0.1–1.0)
Monocytes Relative: 6 %
Neutro Abs: 5.1 10*3/uL (ref 1.7–7.7)
Neutrophils Relative %: 73 %

## 2022-01-05 LAB — CBC
HCT: 42 % (ref 36.0–46.0)
Hemoglobin: 13.3 g/dL (ref 12.0–15.0)
MCH: 28.2 pg (ref 26.0–34.0)
MCHC: 31.7 g/dL (ref 30.0–36.0)
MCV: 89.2 fL (ref 80.0–100.0)
Platelets: 193 10*3/uL (ref 150–400)
RBC: 4.71 MIL/uL (ref 3.87–5.11)
RDW: 13.6 % (ref 11.5–15.5)
WBC: 7 10*3/uL (ref 4.0–10.5)
nRBC: 0 % (ref 0.0–0.2)

## 2022-01-05 LAB — PROTIME-INR
INR: 1 (ref 0.8–1.2)
Prothrombin Time: 13 seconds (ref 11.4–15.2)

## 2022-01-05 LAB — T4, FREE: Free T4: 0.69 ng/dL (ref 0.61–1.12)

## 2022-01-05 LAB — COMPREHENSIVE METABOLIC PANEL
ALT: 20 U/L (ref 0–44)
AST: 20 U/L (ref 15–41)
Albumin: 3.9 g/dL (ref 3.5–5.0)
Alkaline Phosphatase: 88 U/L (ref 38–126)
Anion gap: 8 (ref 5–15)
BUN: 19 mg/dL (ref 6–20)
CO2: 26 mmol/L (ref 22–32)
Calcium: 9.2 mg/dL (ref 8.9–10.3)
Chloride: 105 mmol/L (ref 98–111)
Creatinine, Ser: 0.9 mg/dL (ref 0.44–1.00)
GFR, Estimated: >60 mL/min (ref >60)
Glucose, Bld: 111 mg/dL — ABNORMAL HIGH (ref 70–99)
Potassium: 3.6 mmol/L (ref 3.5–5.1)
Sodium: 139 mmol/L (ref 135–145)
Total Bilirubin: 0.6 mg/dL (ref 0.3–1.2)
Total Protein: 7.1 g/dL (ref 6.5–8.1)

## 2022-01-05 LAB — APTT: aPTT: 35 seconds (ref 24–36)

## 2022-01-05 LAB — LIPID PANEL
Cholesterol: 232 mg/dL — ABNORMAL HIGH (ref 0–200)
HDL: 60 mg/dL (ref >40)
LDL Cholesterol: 131 mg/dL — ABNORMAL HIGH (ref 0–99)
Total CHOL/HDL Ratio: 3.9 RATIO
Triglycerides: 207 mg/dL — ABNORMAL HIGH (ref <150)
VLDL: 41 mg/dL — ABNORMAL HIGH (ref 0–40)

## 2022-01-05 LAB — TSH: TSH: 1.305 u[IU]/mL (ref 0.350–4.500)

## 2022-01-05 LAB — MAGNESIUM: Magnesium: 2.4 mg/dL (ref 1.7–2.4)

## 2022-01-05 LAB — D-DIMER, QUANTITATIVE: D-Dimer, Quant: 0.4 ug/mL-FEU (ref 0.00–0.50)

## 2022-01-05 MED ORDER — BUSPIRONE HCL 10 MG PO TABS
10.0000 mg | ORAL_TABLET | Freq: Two times a day (BID) | ORAL | Status: DC
  Administered 2022-01-05 – 2022-01-06 (×2): 10 mg via ORAL
  Filled 2022-01-05 (×2): qty 1

## 2022-01-05 MED ORDER — MIRTAZAPINE 15 MG PO TABS
30.0000 mg | ORAL_TABLET | Freq: Every day | ORAL | Status: DC
  Administered 2022-01-05: 30 mg via ORAL
  Filled 2022-01-05: qty 2

## 2022-01-05 MED ORDER — DICYCLOMINE HCL 20 MG PO TABS
20.0000 mg | ORAL_TABLET | Freq: Three times a day (TID) | ORAL | Status: DC
  Administered 2022-01-05 – 2022-01-06 (×2): 20 mg via ORAL
  Filled 2022-01-05 (×4): qty 1

## 2022-01-05 MED ORDER — FLUTICASONE FUROATE-VILANTEROL 100-25 MCG/ACT IN AEPB
1.0000 | INHALATION_SPRAY | Freq: Every day | RESPIRATORY_TRACT | Status: DC
Start: 2022-01-05 — End: 2022-01-06
  Filled 2022-01-05: qty 28

## 2022-01-05 MED ORDER — AZATHIOPRINE 50 MG PO TABS
50.0000 mg | ORAL_TABLET | Freq: Every day | ORAL | Status: DC
  Administered 2022-01-06: 50 mg via ORAL
  Filled 2022-01-05: qty 1

## 2022-01-05 MED ORDER — CELECOXIB 200 MG PO CAPS
200.0000 mg | ORAL_CAPSULE | Freq: Every day | ORAL | Status: DC
  Administered 2022-01-06: 200 mg via ORAL
  Filled 2022-01-05: qty 1

## 2022-01-05 MED ORDER — SODIUM CHLORIDE 0.9% FLUSH: 3.0000 mL | Freq: Once | INTRAVENOUS | Status: DC

## 2022-01-05 MED ORDER — ALBUTEROL SULFATE (2.5 MG/3ML) 0.083% IN NEBU: 3.0000 mL | INHALATION_SOLUTION | Freq: Four times a day (QID) | RESPIRATORY_TRACT | Status: DC | PRN

## 2022-01-05 MED ORDER — ACETAMINOPHEN 650 MG RE SUPP: 650.0000 mg | Freq: Four times a day (QID) | RECTAL | Status: DC | PRN

## 2022-01-05 MED ORDER — AMLODIPINE BESYLATE 5 MG PO TABS
2.5000 mg | ORAL_TABLET | Freq: Every day | ORAL | Status: DC
  Administered 2022-01-06: 2.5 mg via ORAL
  Filled 2022-01-05: qty 1

## 2022-01-05 MED ORDER — DULOXETINE HCL 30 MG PO CPEP
60.0000 mg | ORAL_CAPSULE | Freq: Every day | ORAL | Status: DC
Start: 2022-01-06 — End: 2022-01-06
  Administered 2022-01-06: 60 mg via ORAL
  Filled 2022-01-05: qty 2

## 2022-01-05 MED ORDER — PREDNISONE 10 MG PO TABS
10.0000 mg | ORAL_TABLET | Freq: Every day | ORAL | Status: DC
  Administered 2022-01-06: 10 mg via ORAL
  Filled 2022-01-05: qty 1

## 2022-01-05 MED ORDER — SODIUM CHLORIDE 0.9 % IV SOLN
INTRAVENOUS | Status: DC
Start: 2022-01-05 — End: 2022-01-06

## 2022-01-05 MED ORDER — CLONAZEPAM 0.5 MG PO TABS
0.5000 mg | ORAL_TABLET | Freq: Every day | ORAL | Status: DC
  Administered 2022-01-06: 0.5 mg via ORAL
  Filled 2022-01-05: qty 1

## 2022-01-05 MED ORDER — ZOLPIDEM TARTRATE 5 MG PO TABS
5.0000 mg | ORAL_TABLET | Freq: Every evening | ORAL | Status: DC | PRN
  Administered 2022-01-06: 5 mg via ORAL
  Filled 2022-01-05: qty 1

## 2022-01-05 MED ORDER — BISOPROLOL-HYDROCHLOROTHIAZIDE 10-6.25 MG PO TABS
1.0000 | ORAL_TABLET | Freq: Every day | ORAL | Status: DC
  Administered 2022-01-06: 1 via ORAL
  Filled 2022-01-05: qty 1

## 2022-01-05 MED ORDER — ASPIRIN EC 81 MG PO TBEC
81.0000 mg | DELAYED_RELEASE_TABLET | Freq: Every day | ORAL | Status: DC
  Administered 2022-01-06: 81 mg via ORAL
  Filled 2022-01-05: qty 1

## 2022-01-05 MED ORDER — HEPARIN SODIUM (PORCINE) 5000 UNIT/ML IJ SOLN
5000.0000 [IU] | Freq: Three times a day (TID) | INTRAMUSCULAR | Status: DC
  Administered 2022-01-05 – 2022-01-06 (×2): 5000 [IU] via SUBCUTANEOUS
  Filled 2022-01-05 (×2): qty 1

## 2022-01-05 MED ORDER — SODIUM CHLORIDE 0.9% FLUSH
3.0000 mL | Freq: Two times a day (BID) | INTRAVENOUS | Status: DC
  Administered 2022-01-06: 3 mL via INTRAVENOUS

## 2022-01-05 MED ORDER — PANTOPRAZOLE SODIUM 40 MG IV SOLR
40.0000 mg | Freq: Two times a day (BID) | INTRAVENOUS | Status: DC
Start: 2022-01-05 — End: 2022-01-06
  Administered 2022-01-05 – 2022-01-06 (×2): 40 mg via INTRAVENOUS
  Filled 2022-01-05 (×2): qty 10

## 2022-01-05 MED ORDER — HYDROXYCHLOROQUINE SULFATE 200 MG PO TABS
200.0000 mg | ORAL_TABLET | Freq: Two times a day (BID) | ORAL | Status: DC
  Administered 2022-01-05 – 2022-01-06 (×2): 200 mg via ORAL
  Filled 2022-01-05 (×2): qty 1

## 2022-01-05 MED ORDER — ACETAMINOPHEN 325 MG PO TABS
650.0000 mg | ORAL_TABLET | Freq: Four times a day (QID) | ORAL | Status: DC | PRN
  Administered 2022-01-05: 650 mg via ORAL
  Filled 2022-01-05: qty 2

## 2022-01-05 MED ORDER — IOHEXOL 350 MG/ML SOLN
75.0000 mL | Freq: Once | INTRAVENOUS | Status: AC | PRN
  Administered 2022-01-05: 100 mL via INTRAVENOUS

## 2022-01-05 MED ORDER — METOCLOPRAMIDE HCL 5 MG PO TABS
5.0000 mg | ORAL_TABLET | Freq: Three times a day (TID) | ORAL | Status: DC
  Administered 2022-01-06 (×2): 5 mg via ORAL
  Filled 2022-01-05 (×4): qty 1

## 2022-01-05 NOTE — ED Notes (Signed)
Pt. To ED rm. 8 from CT, teleneuro at bedside with neurologist speaking with pt's daughter. ?

## 2022-01-05 NOTE — ED Triage Notes (Signed)
First Nurse Note:  C/O numbness and tingling to left side of body since 1500.  Patient is AAOx3.  Skin warm and dry.  MAE equally and strong.  Speech clear.  Facial movements equal. Gait steady NAD ?

## 2022-01-05 NOTE — ED Triage Notes (Signed)
Pt has numbness in left side of face, neck and left arm/shoulder. Pt reports a headache.  No n/v.  Pt has numbness in left leg.  Pt reports dry mouth.  Pt alert  speech clear.   ?

## 2022-01-05 NOTE — ED Notes (Signed)
ED TO INPATIENT HANDOFF REPORT  ED Nurse Name and Phone #: Fredric Mare [phone]  S Name/Age/Gender Brittany Huynh 56 y.o. female Room/Bed: ED08A/ED08A  Code Status   Code Status: Full Code  Home/SNF/Other Home Patient oriented to: self, place, time, and situation Is this baseline? Yes   Triage Complete: Triage complete  Chief Complaint Paresthesia [R20.2]  Triage Note First Nurse Note:  C/O numbness and tingling to left side of body since 1500.  Patient is AAOx3.  Skin warm and dry.  MAE equally and strong.  Speech clear.  Facial movements equal. Gait steady NAD  Pt has numbness in left side of face, neck and left arm/shoulder. Pt reports a headache.  No n/v.  Pt has numbness in left leg.  Pt reports dry mouth.  Pt alert  speech clear.     Allergies Allergies  Allergen Reactions   Onion Anaphylaxis   Adhesive [Tape] Rash    Level of Care/Admitting Diagnosis ED Disposition     ED Disposition  Admit   Condition  --   Comment  Hospital Area: [facility] [100120]  Level of Care: Telemetry Cardiac [103]  Covid Evaluation: Asymptomatic - no recent exposure (last 10 days) testing not required  Diagnosis: Paresthesia [213086]  Admitting Physician: Darrold Junker  Attending Physician: Darrold Junker          B Medical/Surgery History Past Medical History:  Diagnosis Date   Anemia    Anxiety    Asthma    Breast fibrocystic disorder 2013   Edema 09/15/2017   Hernia 2011   chest wall   Iron deficiency anemia 09/15/2017   Lung mass 2011   Sarcoidosis    Shortness of breath    2011   Tachycardia, unspecified 09/15/2017   Past Surgical History:  Procedure Laterality Date   ABDOMINAL HYSTERECTOMY  2009   BREAST BIOPSY Left 02/15/2018   PREDOMINANTLY MATURE ADIPOSE TISSUE WITH VERY FOCAL AREAS OF BENIGN    BREAST EXCISIONAL BIOPSY     chamberlain procedure   2011   COLONOSCOPY WITH PROPOFOL N/A 09/02/2020   Procedure:  COLONOSCOPY WITH PROPOFOL;  Surgeon: Toney Reil, MD;  Location: [facility] ENDOSCOPY;  Service: Gastroenterology;  Laterality: N/A;   ESOPHAGOGASTRODUODENOSCOPY (EGD) WITH PROPOFOL N/A 09/02/2020   Procedure: ESOPHAGOGASTRODUODENOSCOPY (EGD) WITH PROPOFOL;  Surgeon: Toney Reil, MD;  Location: [facility] ENDOSCOPY;  Service: Gastroenterology;  Laterality: N/A;   IRRIGATION AND DEBRIDEMENT HEMATOMA Left 03/07/2018   Procedure: IRRIGATION AND DEBRIDEMENT HEMATOMA-LEFT BREAST;  Surgeon: Earline Mayotte, MD;  Location: [facility] ORS;  Service: General;  Laterality: Left;   LUNG BIOPSY  2011   RESECTION RIBS EXTRAPLEURAL  [id number]     A IV Location/Drains/Wounds Patient Lines/Drains/Airways Status     Active Line/Drains/Airways     Name Placement date Placement time Site Days   Incision - 1 Port  03/07/18  1249  -- 1400            Intake/Output Last 24 hours No intake or output data in the 24 hours ending 01/05/22 2222  Labs/Imaging Results for orders placed or performed during the hospital encounter of 01/05/22 (from the past 48 hour(s))  Protime-INR     Status: None   Collection Time: 01/05/22  4:54 PM  Result Value Ref Range   Prothrombin Time 13.0 11.4 - 15.2 seconds   INR 1.0 0.8 - 1.2    Comment: (NOTE) INR goal varies based on device and disease states. Performed at  [facility], [address] [place], [place] [address]   APTT     Status: None   Collection Time: 01/05/22  4:54 PM  Result Value Ref Range   aPTT 35 24 - 36 seconds    Comment: Performed at [facility], [address] [place], [place] [address]  CBC     Status: None   Collection Time: 01/05/22  4:54 PM  Result Value Ref Range   WBC 7.0 4.0 - 10.5 K/uL   RBC 4.71 3.87 - 5.11 MIL/uL   Hemoglobin 13.3 12.0 - 15.0 g/dL   HCT 78.4 69.6 - 29.5 %   MCV 89.2 80.0 - 100.0 fL   MCH 28.2 26.0 - 34.0 pg   MCHC 31.7 30.0 - 36.0 g/dL   RDW 28.4 13.2 - 44.0 %   Platelets 193 150 - 400 K/uL    nRBC 0.0 0.0 - 0.2 %    Comment: Performed at [facility], [address] [place], [place] [address]  Differential     Status: None   Collection Time: 01/05/22  4:54 PM  Result Value Ref Range   Neutrophils Relative % 73 %   Neutro Abs 5.1 1.7 - 7.7 K/uL   Lymphocytes Relative 19 %   Lymphs Abs 1.4 0.7 - 4.0 K/uL   Monocytes Relative 6 %   Monocytes Absolute 0.4 0.1 - 1.0 K/uL   Eosinophils Relative 0 %   Eosinophils Absolute 0.0 0.0 - 0.5 K/uL   Basophils Relative 1 %   Basophils Absolute 0.0 0.0 - 0.1 K/uL   Immature Granulocytes 1 %   Abs Immature Granulocytes 0.06 0.00 - 0.07 K/uL    Comment: Performed at [facility], [address] [place], [place] [address]  Comprehensive metabolic panel     Status: Abnormal   Collection Time: 01/05/22  4:54 PM  Result Value Ref Range   Sodium 139 135 - 145 mmol/L   Potassium 3.6 3.5 - 5.1 mmol/L   Chloride 105 98 - 111 mmol/L   CO2 26 22 - 32 mmol/L   Glucose, Bld 111 (H) 70 - 99 mg/dL    Comment: Glucose reference range applies only to samples taken after fasting for at least 8 hours.   BUN 19 6 - 20 mg/dL   Creatinine, Ser 4.03 0.44 - 1.00 mg/dL   Calcium 9.2 8.9 - 47.4 mg/dL   Total Protein 7.1 6.5 - 8.1 g/dL   Albumin 3.9 3.5 - 5.0 g/dL   AST 20 15 - 41 U/L   ALT 20 0 - 44 U/L   Alkaline Phosphatase 88 38 - 126 U/L   Total Bilirubin 0.6 0.3 - 1.2 mg/dL   GFR, Estimated >25 >95 mL/min    Comment: (NOTE) Calculated using the CKD-EPI Creatinine Equation (2021)    Anion gap 8 5 - 15    Comment: Performed at [facility], [address] [place], [place] [address]   MR BRAIN WO CONTRAST  Result Date: 01/05/2022 CLINICAL DATA:  Left head and face and left hand weakness EXAM: MRI HEAD WITHOUT CONTRAST MRI CERVICAL SPINE WITHOUT CONTRAST TECHNIQUE: Multiplanar, multiecho pulse sequences of the brain and surrounding structures, and cervical spine, to include the craniocervical junction and  cervicothoracic junction, were obtained without intravenous contrast. COMPARISON:  No prior MRI, correlation is made with CTA head neck 01/05/2022 FINDINGS: MRI HEAD FINDINGS Brain: No restricted diffusion to suggest acute or subacute infarct. No acute hemorrhage, mass, mass effect, or  midline shift. No hemosiderin deposition to suggest remote hemorrhage. No hydrocephalus or extra-axial collection. Vascular: Normal flow voids. Skull and upper cervical spine: Normal marrow signal. Sinuses/Orbits: No acute finding. Other: The mastoids are well aerated. MRI CERVICAL SPINE FINDINGS Alignment: Straightening and reversal of the normal cervical lordosis. Trace retrolisthesis of C6 on C7. Vertebrae: No acute fracture or suspicious osseous lesion. Cord: Normal signal and morphology. Posterior Fossa, vertebral arteries, paraspinal tissues: Negative. Disc levels: C2-C3: No significant disc bulge. No spinal canal stenosis or neuroforaminal narrowing. C3-C4: No significant disc bulge. No spinal canal stenosis or neuroforaminal narrowing. C4-C5: No significant disc bulge. No spinal canal stenosis or neuroforaminal narrowing. C5-C6: Minimal disc bulge. The ventral cord abuts the disc, without spinal canal stenosis. No neural foraminal narrowing. C6-C7: Trace retrolisthesis and mild disc bulge with superimposed central protrusion, which abuts and mildly deforms the ventral cord. Moderate spinal canal stenosis. Uncovertebral and facet arthropathy. Mild right and moderate left neural foraminal narrowing. C7-T1: No significant disc bulge. No spinal canal stenosis or neuroforaminal narrowing. IMPRESSION: 1.  No acute intracranial process. 2. C6-C7 moderate spinal canal stenosis with moderate left and mild right neural foraminal narrowing. Electronically Signed   By: Wiliam Ke M.D.   On: 01/05/2022 20:06   MR CERVICAL SPINE WO CONTRAST  Result Date: 01/05/2022 CLINICAL DATA:  Left head and face and left hand weakness EXAM: MRI  HEAD WITHOUT CONTRAST MRI CERVICAL SPINE WITHOUT CONTRAST TECHNIQUE: Multiplanar, multiecho pulse sequences of the brain and surrounding structures, and cervical spine, to include the craniocervical junction and cervicothoracic junction, were obtained without intravenous contrast. COMPARISON:  No prior MRI, correlation is made with CTA head neck 01/05/2022 FINDINGS: MRI HEAD FINDINGS Brain: No restricted diffusion to suggest acute or subacute infarct. No acute hemorrhage, mass, mass effect, or midline shift. No hemosiderin deposition to suggest remote hemorrhage. No hydrocephalus or extra-axial collection. Vascular: Normal flow voids. Skull and upper cervical spine: Normal marrow signal. Sinuses/Orbits: No acute finding. Other: The mastoids are well aerated. MRI CERVICAL SPINE FINDINGS Alignment: Straightening and reversal of the normal cervical lordosis. Trace retrolisthesis of C6 on C7. Vertebrae: No acute fracture or suspicious osseous lesion. Cord: Normal signal and morphology. Posterior Fossa, vertebral arteries, paraspinal tissues: Negative. Disc levels: C2-C3: No significant disc bulge. No spinal canal stenosis or neuroforaminal narrowing. C3-C4: No significant disc bulge. No spinal canal stenosis or neuroforaminal narrowing. C4-C5: No significant disc bulge. No spinal canal stenosis or neuroforaminal narrowing. C5-C6: Minimal disc bulge. The ventral cord abuts the disc, without spinal canal stenosis. No neural foraminal narrowing. C6-C7: Trace retrolisthesis and mild disc bulge with superimposed central protrusion, which abuts and mildly deforms the ventral cord. Moderate spinal canal stenosis. Uncovertebral and facet arthropathy. Mild right and moderate left neural foraminal narrowing. C7-T1: No significant disc bulge. No spinal canal stenosis or neuroforaminal narrowing. IMPRESSION: 1.  No acute intracranial process. 2. C6-C7 moderate spinal canal stenosis with moderate left and mild right neural foraminal  narrowing. Electronically Signed   By: Wiliam Ke M.D.   On: 01/05/2022 20:06   CT HEAD CODE STROKE WO CONTRAST  Result Date: 01/05/2022 CLINICAL DATA:  Code stroke. Numbness and tingling to left side of body EXAM: CT HEAD WITHOUT CONTRAST TECHNIQUE: Contiguous axial images were obtained from the base of the skull through the vertex without intravenous contrast. RADIATION DOSE REDUCTION: This exam was performed according to the departmental dose-optimization program which includes automated exposure control, adjustment of the mA and/or kV according to patient size and/or  use of iterative reconstruction technique. COMPARISON:  No prior CT, correlation is made with MRI head 03/24/2014. FINDINGS: Brain: No evidence of acute infarction, hemorrhage, cerebral edema, mass, mass effect, or midline shift. Ventricles and sulci are normal for age. No extra-axial fluid collection. Previously noted extension of the cerebellar tonsils through the foramen magnum is not well evaluated on this exam given the field of view. Vascular: No hyperdense vessel or unexpected calcification. Skull: Normal. Negative for fracture or focal lesion. Sinuses/Orbits: No acute finding. Other: The mastoid air cells are well aerated. ASPECTS [facility] Stroke Program Early CT Score) - Ganglionic level infarction (caudate, lentiform nuclei, internal capsule, insula, M1-M3 cortex): 7 - Supraganglionic infarction (M4-M6 cortex): 3 Total score (0-10 with 10 being normal): 10 IMPRESSION: 1. No acute intracranial process. 2. ASPECTS is 10 Code stroke imaging results were communicated on 01/05/2022 at 4:39 pm to provider [employer] via telephone, who verbally acknowledged these results. Electronically Signed   By: Wiliam Ke M.D.   On: 01/05/2022 16:41   CT ANGIO HEAD NECK W WO CM W PERF (CODE STROKE)  Result Date: 01/05/2022 CLINICAL DATA:  Left-sided numbness EXAM: CT ANGIOGRAPHY HEAD AND NECK CT PERFUSION BRAIN TECHNIQUE: Multidetector CT imaging  of the head and neck was performed using the standard protocol during bolus administration of intravenous contrast. Multiplanar CT image reconstructions and MIPs were obtained to evaluate the vascular anatomy. Carotid stenosis measurements (when applicable) are obtained utilizing NASCET criteria, using the distal internal carotid diameter as the denominator. Multiphase CT imaging of the brain was performed following IV bolus contrast injection. Subsequent parametric perfusion maps were calculated using RAPID software. RADIATION DOSE REDUCTION: This exam was performed according to the departmental dose-optimization program which includes automated exposure control, adjustment of the mA and/or kV according to patient size and/or use of iterative reconstruction technique. CONTRAST:  OMNIPAQUE IOHEXOL 350 MG/ML SOLN COMPARISON:  No prior CTA, correlation is made with CT head 01/05/2022 clinical correlation is also made with CTA chest 10/07/2019 FINDINGS: CT HEAD FINDINGS For noncontrast findings, please see same day CT head. CTA NECK FINDINGS Aortic arch: Standard branching. Imaged portion shows no evidence of aneurysm or dissection. No significant stenosis of the major arch vessel origins. Right carotid system: No evidence of dissection, occlusion, or hemodynamically significant stenosis (greater than 50%). Left carotid system: No evidence of dissection, occlusion, or hemodynamically significant stenosis (greater than 50%). Vertebral arteries: No evidence of dissection, occlusion, or hemodynamically significant stenosis (greater than 50%). Skeleton: No acute osseous abnormality. Other neck: Subcentimeter nodule in the leftthyroid, for which no follow-up is recommended, given the size of the lesion. (Reference: J Am Coll Radiol. 2015 Feb;12(2): 143-50) Upper chest: Redemonstrated medial right upper lobe pulmonary nodule with adjacent scarring, which measures up to 1.6 x 1.6 cm (series 7, image 40), unchanged when  remeasured similarly on the 10/07/2019 exam. No new focal pulmonary opacity or pleural effusion. Review of the MIP images confirms the above findings CTA HEAD FINDINGS Anterior circulation: Both internal carotid arteries are patent to the termini, without significant stenosis. A1 segments patent. Normal anterior communicating artery. Anterior cerebral arteries are patent to their distal aspects. No M1 stenosis or occlusion. Normal MCA bifurcations. Distal MCA branches perfused and symmetric. Posterior circulation: Vertebral arteries patent to the vertebrobasilar junction without stenosis. Posterior inferior cerebral arteries patent bilaterally. Basilar patent to its distal aspect. Superior cerebellar arteries patent bilaterally. Patent P1 segments, although the right P1 is significantly hypoplastic, and joints with a diminutive right posterior communicating  artery to form a diminutive right PCA, which appears patent. The left PCA is of larger caliber and is patent to its distal aspect. The left posterior communicating artery is not visualized. Venous sinuses: As permitted by contrast timing, patent. Anatomic variants: None significant. Review of the MIP images confirms the above findings CT Brain Perfusion Findings: ASPECTS: 10 CBF (<30%) Volume: 0mL Perfusion (Tmax>6.0s) volume: 0mL Mismatch Volume: 0mL Infarction Location:None IMPRESSION: 1.  No intracranial large vessel occlusion or significant stenosis. 2.  No hemodynamically significant stenosis in the neck. 3. No evidence of infarct core or penumbra on CT perfusion. 4. Redemonstrated right upper lobe nodule, which appears unchanged compared to 2021. Electronically Signed   By: Wiliam Ke M.D.   On: 01/05/2022 17:52    Pending Labs Unresulted Labs (From admission, onward)     Start     Ordered   01/06/22 0500  Comprehensive metabolic panel  Tomorrow morning,   STAT        01/05/22 1904   01/06/22 0500  CBC  Tomorrow morning,   STAT        01/05/22  1904   01/06/22 0500  HIV Antibody (routine testing w rflx)  Once,   R        01/06/22 0500   01/05/22 1904  PTH, intact and calcium  Add-on,   AD        01/05/22 1904   01/05/22 1904  Magnesium  Add-on,   AD        01/05/22 1904   01/05/22 1904  T4, free  Once,   URGENT        01/05/22 1904   01/05/22 1904  TSH  Once,   URGENT        01/05/22 1904   01/05/22 1859  D-dimer, quantitative  Add-on,   AD        01/05/22 1858   01/05/22 1707  Lipid panel  Once,   URGENT        01/05/22 1706   01/05/22 1707  Hemoglobin A1c  Once,   URGENT        01/05/22 1706            Vitals/Pain Today's Vitals   01/05/22 1725 01/05/22 1730 01/05/22 1735 01/05/22 1900  BP:  126/81  (!) 153/89  Pulse: 76 93 85 68  Resp: 16 18 16 18   Temp:      TempSrc:      SpO2: 99% 100% 99% 100%  Weight:      Height:      PainSc:        Isolation Precautions No active isolations  Medications Medications  sodium chloride flush (NS) 0.9 % injection 3 mL (3 mLs Intravenous Not Given 01/05/22 1806)  aspirin EC tablet 81 mg (has no administration in time range)  celecoxib (CELEBREX) capsule 200 mg (has no administration in time range)  hydroxychloroquine (PLAQUENIL) tablet 200 mg (has no administration in time range)  amLODipine (NORVASC) tablet 2.5 mg (has no administration in time range)  bisoprolol-hydrochlorothiazide (ZIAC) 10-6.25 MG per tablet 1 tablet (has no administration in time range)  busPIRone (BUSPAR) tablet 10 mg (has no administration in time range)  DULoxetine (CYMBALTA) DR capsule 60 mg (has no administration in time range)  mirtazapine (REMERON) tablet 30 mg (has no administration in time range)  predniSONE (DELTASONE) tablet 10 mg (has no administration in time range)  azaTHIOprine (IMURAN) tablet 50 mg (has no administration in time range)  dicyclomine (BENTYL)  tablet 20 mg (has no administration in time range)  metoCLOPramide (REGLAN) tablet 5 mg (has no administration in time  range)  clonazePAM (KLONOPIN) tablet 0.5 mg (has no administration in time range)  albuterol (PROVENTIL) (2.5 MG/3ML) 0.083% nebulizer solution 3 mL (has no administration in time range)  fluticasone furoate-vilanterol (BREO ELLIPTA) 100-25 MCG/ACT 1 puff (has no administration in time range)  heparin injection 5,000 Units (has no administration in time range)  sodium chloride flush (NS) 0.9 % injection 3 mL (has no administration in time range)  acetaminophen (TYLENOL) tablet 650 mg (has no administration in time range)    Or  acetaminophen (TYLENOL) suppository 650 mg (has no administration in time range)  0.9 %  sodium chloride infusion (has no administration in time range)  pantoprazole (PROTONIX) injection 40 mg (has no administration in time range)  iohexol (OMNIPAQUE) 350 MG/ML injection 75 mL (100 mLs Intravenous Contrast Given 01/05/22 1712)    Mobility walks Low fall risk   Focused Assessments Neuro Assessment Handoff:  Swallow screen pass? Yes    NIH Stroke Scale ( + Modified Stroke Scale Criteria)  Interval: Initial Level of Consciousness (1a.)   : Alert, keenly responsive LOC Questions (1b. )   +: Answers both questions correctly LOC Commands (1c. )   + : Performs both tasks correctly Best Gaze (2. )  +: Normal Visual (3. )  +: No visual loss Facial Palsy (4. )    : Normal symmetrical movements Motor Arm, Left (5a. )   +: No drift Motor Arm, Right (5b. )   +: No drift Motor Leg, Left (6a. )   +: No drift Motor Leg, Right (6b. )   +: No drift Limb Ataxia (7. ): Absent Sensory (8. )   +: Mild-to-moderate sensory loss, patient feels pinprick is less sharp or is dull on the affected side, or there is a loss of superficial pain with pinprick, but patient is aware of being touched Best Language (9. )   +: No aphasia Dysarthria (10. ): Normal Extinction/Inattention (11.)   +: No Abnormality Modified SS Total  +: 1 Complete NIHSS TOTAL: 1 Last date known well:  01/05/22 Last time known well: 1450 Neuro Assessment: Exceptions to WDL Neuro Checks:   Initial (01/05/22 1648)  Last Documented NIHSS Modified Score: 1 (01/05/22 2221) Has TPA been given? No If patient is a Neuro Trauma and patient is going to OR before floor call report to 4N Charge nurse: [phone] or [phone]   R Recommendations: See Admitting Provider Note  Report given to:   Additional Notes: -

## 2022-01-05 NOTE — ED Notes (Signed)
[facility]ed Code Stroke to [employer]  1630 ?

## 2022-01-05 NOTE — ED Notes (Signed)
ED Provider at bedside. 

## 2022-01-05 NOTE — ED Notes (Signed)
Pt. Up to bathroom independently. States much relief of symptoms. ?

## 2022-01-05 NOTE — Assessment & Plan Note (Signed)
Resolved

## 2022-01-05 NOTE — ED Notes (Signed)
Dr. Patel at bedside 

## 2022-01-05 NOTE — H&P (Signed)
?History and Physical  ? ? ?Patient: Brittany Huynh [id number] DOB: May 17, 1966 ?DOA: 01/05/2022 ?DOS: the patient was seen and examined on 01/05/2022 ?PCP: Jonetta Osgood, NP  ?Patient coming from: Home ? ?Chief Complaint:  ?Chief Complaint  ?Patient presents with  ? Numbness  ? ?HPI: Brittany Huynh is a 56 y.o. female with medical history significant of tingling affecting the left side of her face left side of her upper extremity on the lateral aspect of the left leg.  Her symptoms started when she was at work when she developed numbness in her hands when she dropped a close that she started having some numbness at which point she called her supervisor and came to the emergency room at about 3:00 her symptoms lasted until about 3 [phone].  The patient reports numbness and tingling and seeing neurologist I suspect she has degenerative issues.  Patient is chronically on steroids and also is on azathioprine along with suspect she may have underlying neuropathy etiology. ?Patient also has a vague history with neuropathy and sees neurologist. ?Symptoms started 3 pm today and lasted about 3:30 so 30 minutes.  ?Code stroke was called in the emergency room. ?Patient is no weakness in her upper extremity but does have some restricted range of motion in her lower legs which she has seen neurology for and does not know the weakness in her legs.  No incontinence or alarm symptoms. ? ?Review of Systems  ?Neurological:  Positive for dizziness, tingling, sensory change, speech change and headaches. Negative for tremors, focal weakness, seizures, loss of consciousness and weakness.  ?All other systems reviewed and are negative. ? ?Past Medical History:  ?Diagnosis Date  ? Anemia   ? Anxiety   ? Asthma   ? Breast fibrocystic disorder 2013  ? Edema 09/15/2017  ? Hernia 2011  ? chest wall  ? Iron deficiency anemia 09/15/2017  ? Lung mass 2011  ? Sarcoidosis   ? Shortness of breath   ? 2011  ? Tachycardia, unspecified 09/15/2017   ? ?Past Surgical History:  ?Procedure Laterality Date  ? ABDOMINAL HYSTERECTOMY  2009  ? BREAST BIOPSY Left 02/15/2018  ? PREDOMINANTLY MATURE ADIPOSE TISSUE WITH VERY FOCAL AREAS OF BENIGN   ? BREAST EXCISIONAL BIOPSY    ? chamberlain procedure   2011  ? COLONOSCOPY WITH PROPOFOL N/A 09/02/2020  ? Procedure: COLONOSCOPY WITH PROPOFOL;  Surgeon: Lin Landsman, MD;  Location: [facility] ENDOSCOPY;  Service: Gastroenterology;  Laterality: N/A;  ? ESOPHAGOGASTRODUODENOSCOPY (EGD) WITH PROPOFOL N/A 09/02/2020  ? Procedure: ESOPHAGOGASTRODUODENOSCOPY (EGD) WITH PROPOFOL;  Surgeon: Lin Landsman, MD;  Location: [facility] ENDOSCOPY;  Service: Gastroenterology;  Laterality: N/A;  ? IRRIGATION AND DEBRIDEMENT HEMATOMA Left 03/07/2018  ? Procedure: IRRIGATION AND DEBRIDEMENT HEMATOMA-LEFT BREAST;  Surgeon: Robert Bellow, MD;  Location: [facility] ORS;  Service: General;  Laterality: Left;  ? LUNG BIOPSY  2011  ? RESECTION RIBS EXTRAPLEURAL  13086  ? ?Social History:  reports that she has never smoked. She has never used smokeless tobacco. She reports current alcohol use. She reports that she does not use drugs. ? ?Allergies  ?Allergen Reactions  ? Onion Anaphylaxis  ? Adhesive [Tape] Rash  ? ? ?Family History  ?Problem Relation Age of Onset  ? Liver cancer Mother 13  ? Bone cancer Maternal Aunt 60  ? Breast cancer Maternal Aunt   ? Breast cancer Paternal Aunt   ? Kidney cancer Father   ? Bone cancer Paternal Aunt 2  ? ? ?Prior to Admission  medications   ?Medication Sig Start Date End Date Taking? Authorizing Provider  ?amLODipine (NORVASC) 2.5 MG tablet Take 1 tablet (2.5 mg total) by mouth daily. 08/09/21  Yes Abernathy, Yetta Flock, NP  ?aspirin EC 81 MG EC tablet Take 1 tablet (81 mg total) by mouth daily. Swallow whole. 09/14/20  Yes Lorella Nimrod, MD  ?azaTHIOprine (IMURAN) 50 MG tablet Take 1 tablet (50 mg total) by mouth daily. 08/09/21  Yes Abernathy, Yetta Flock, NP  ?bisoprolol-hydrochlorothiazide (ZIAC) 10-6.25 MG tablet TAKE 1  TABLET BY MOUTH EVERY DAY IN THE MORNING FOR BLOOD PRESSURE 08/09/21  Yes Abernathy, Alyssa, NP  ?busPIRone (BUSPAR) 10 MG tablet TAKE 1 TABLET BY MOUTH TWICE A DAY ?Patient taking differently: Take 10 mg by mouth 2 (two) times daily. 09/09/21  Yes Abernathy, Yetta Flock, NP  ?celecoxib (CELEBREX) 200 MG capsule Take 1 capsule (200 mg total) by mouth daily. 03/29/21  Yes Abernathy, Yetta Flock, NP  ?clonazePAM (KLONOPIN) 0.5 MG tablet Take 1/2 tab once a day for a week then increase to twice a day ?Patient taking differently: Take 0.5 mg by mouth daily. 12/30/21  Yes McDonough, Lauren K, PA-C  ?dicyclomine (BENTYL) 20 MG tablet TAKE 1 TABLET (20 MG TOTAL) BY MOUTH 4 (FOUR) TIMES DAILY - BEFORE MEALS AND AT BEDTIME. 11/12/21  Yes Abernathy, Alyssa, NP  ?DULoxetine (CYMBALTA) 60 MG capsule TAKE 1 CAPSULE BY MOUTH EVERY DAY ?Patient taking differently: Take 60 mg by mouth daily. 04/22/21  Yes Lavera Guise, MD  ?hydroxychloroquine (PLAQUENIL) 200 MG tablet Take 1 tablet (200 mg total) by mouth 2 (two) times daily. 08/09/21  Yes Abernathy, Yetta Flock, NP  ?metoCLOPramide (REGLAN) 5 MG tablet TAKE 1 TABLET (5 MG TOTAL) BY MOUTH 4 (FOUR) TIMES DAILY - BEFORE MEALS AND AT BEDTIME. 01/03/22  Yes Abernathy, Yetta Flock, NP  ?mirtazapine (REMERON) 30 MG tablet Take 1 tablet (30 mg total) by mouth at bedtime. 08/09/21  Yes Abernathy, Yetta Flock, NP  ?predniSONE (DELTASONE) 5 MG tablet TAKE 2 TABLETS BY MOUTH DAILY WITH BREAKFAST. 11/21/21  Yes Abernathy, Alyssa, NP  ?zolpidem (AMBIEN) 10 MG tablet Take 1 tablet (10 mg total) by mouth at bedtime as needed for sleep. 10/28/21  Yes Abernathy, Yetta Flock, NP  ?acetaminophen (TYLENOL) 500 MG tablet Take 500 mg by mouth every 6 (six) hours as needed.    [provider]  ?albuterol (VENTOLIN HFA) 108 (90 Base) MCG/ACT inhaler Inhale 2 puffs into the lungs every 6 (six) hours as needed for wheezing or shortness of breath. 08/09/21   Jonetta Osgood, NP  ?augmented betamethasone dipropionate (DIPROLENE-AF) 0.05 %  ointment Apply topically 2 (two) times daily. Until resolved 08/09/21   Jonetta Osgood, NP  ?fluticasone-salmeterol (ADVAIR) 100-50 MCG/ACT AEPB Inhale 1 puff into the lungs 2 (two) times daily. 08/09/21   Jonetta Osgood, NP  ? ? ?Physical Exam: ?Vitals:  ? 01/05/22 1720 01/05/22 1725 01/05/22 1730 01/05/22 1735  ?BP:   126/81   ?Pulse: 74 76 93 85  ?Resp: 13 16 18 16   ?Temp:      ?TempSrc:      ?SpO2: 100% 99% 100% 99%  ?Weight:      ?Height:      ?Physical Exam ?Vitals and nursing note reviewed.  ?Constitutional:   ?   General: She is not in acute distress. ?   Appearance: Normal appearance. She is not ill-appearing, toxic-appearing or diaphoretic.  ?HENT:  ?   Head: Normocephalic and atraumatic.  ?   Right Ear: Hearing and external ear normal.  ?  Left Ear: Hearing and external ear normal.  ?   Nose: Nose normal. No nasal deformity.  ?   Mouth/Throat:  ?   Lips: Pink.  ?   Mouth: Mucous membranes are moist.  ?   Tongue: No lesions.  ?   Pharynx: Oropharynx is clear.  ?Eyes:  ?   Extraocular Movements: Extraocular movements intact.  ?   Pupils: Pupils are equal, round, and reactive to light.  ?Neck:  ?   Vascular: No carotid bruit.  ?Cardiovascular:  ?   Rate and Rhythm: Normal rate and regular rhythm.  ?   Pulses: Normal pulses.  ?   Heart sounds: Normal heart sounds.  ?Pulmonary:  ?   Effort: Pulmonary effort is normal.  ?   Breath sounds: Normal breath sounds.  ?Abdominal:  ?   General: Bowel sounds are normal. There is no distension.  ?   Palpations: Abdomen is soft. There is no mass.  ?   Tenderness: There is no abdominal tenderness. There is no guarding.  ?   Hernia: No hernia is present.  ?Musculoskeletal:  ?   Right lower leg: No edema.  ?   Left lower leg: No edema.  ?Skin: ?   General: Skin is warm.  ?Neurological:  ?   General: No focal deficit present.  ?   Mental Status: She is alert and oriented to person, place, and time.  ?   Cranial Nerves: Cranial nerves 2-12 are intact.  ?   Motor: Motor  function is intact.  ?Psychiatric:     ?   Attention and Perception: Attention normal.     ?   Mood and Affect: Mood normal.     ?   Speech: Speech normal.     ?   Behavior: Behavior normal. Behavior is

## 2022-01-05 NOTE — ED Notes (Signed)
Lab requests PTH to be redrawn d/t hemolysis. Report given to floor. Pt on the way up now. Floor RN updated via secure chat  ?

## 2022-01-05 NOTE — Assessment & Plan Note (Signed)
Vi ppi therapy.  ? ?

## 2022-01-05 NOTE — Consult Note (Signed)
[facility] Telemedicine Consult ? ? ?Requesting Provider: Dr Cinda Quest ?Consult Participants: patient, her daughter, Dr Jeannetta Nap and myself ?Location of the provider: [facility] ?Location of the patient: [facility] ? ?This consult was provided via telemedicine with 2-way video and audio communication. The patient/family was informed that care would be provided in this way and agreed to receive care in this manner.  ? ? ?Chief Complaint: left sided numbness ? ?HPI: Mr. Orders is a 56 year old African-American lady with past medical history of anxiety, anemia, asthma, sarcoidosis who presents with sudden onset of left face arm numbness 2:50 PM today.  Patient describes the numbness has been patchy starting around eyes and then involving the left cheek and then the left forearm.  She was holding some objects which she nearly dropped.  She denies true weakness.  She had no trouble with walking, she denies accompanying headache.  She has no prior history of strokes or TIAs.  Her only vascular risk factors appear to be hypertension.  She denies any visual symptoms, speech difficulties. ? ?CT head code stroke reviewed personally shows no acute abnormality. ? ?LKW: 1450 ?tpa given?: No, too mild to traet ?IR Thrombectomy? No, no LVO suspected ?Modified Rankin Scale: 0-Completely asymptomatic and back to baseline post- stroke ?Time of teleneurologist evaluation: 1430 ? ?Exam: ?Vitals:  ? 01/05/22 1618  ?BP: (!) 142/92  ?Pulse: 79  ?Resp: 20  ?Temp: 97.7 ?F (36.5 ?C)  ?SpO2: 97%  ? ? ?General: Pleasant obese 56-year-old African-American lady not in distress. ? .  NIH stroke scale.  ?1A: Level of Consciousness - 0 ?1B: Ask Month and Age - 0 ?1C: 'Blink Eyes' & 'Squeeze Hands' - 0 ?2: Test Horizontal Extraocular Movements - 0 ?3: Test Visual Fields - 0 ?4: Test Facial Palsy - 0 ?5A: Test Left Arm Motor Drift - 0 ?5B: Test Right Arm Motor Drift - 0 ?6A: Test Left Leg Motor Drift - 0 ?6B: Test Right Leg Motor  Drift - 0 ?7: Test Limb Ataxia - 0 ?8: Test Sensation - 1 ?9: Test Language/Aphasia- 0 ?10: Test Dysarthria - 0 ?11: Test Extinction/Inattention - 0 ?NIHSS score: 1 ? ? ?Imaging Reviewed: Ct head wo ? ?Labs reviewed in epic and pertinent values follow: ?None available ? ? ?Assessment:  ?56 year old African-American lady with sudden onset of left Arm Numbness of Unclear Etiology Possibly to Include Right Subcortical Infarct from Small Vessel Disease. ?Patient has presented within time window for IV thrombolysis but neurological deficits are to minimal at the present time to justify the risk of IV thrombolysis with TNK.  I have discussed the risk benefit with the patient and she is in agreement with the plan for admission for stroke work-up but will hold TNK unless she has neurological worsening within the TNK window. ?Recommendations: Admit to the medical hospitalist team.  Stroke work-up with MRI scan of the brain, CT angiogram of brain and neck, lipid profile, hemoglobin A1c and echocardiogram.  Start aspirin 81 mg for now and may need to change depending upon MRI results.  Neuro hospitalist Dr.Bhagat will follow on consults ? ?  ? ? ? ?This patient is receiving care for possible acute neurological changes. There was 60 minutes of care by this provider at the time of service, including time for direct evaluation via telemedicine, review of medical records, imaging studies and discussion of findings with providers, the patient and/or family. ? ?Antony Contras MD ?[facility] ?[phone] ? ?If 7pm- 7am, please page neurology on call as  listed in [place].  ?

## 2022-01-05 NOTE — ED Notes (Signed)
Patient transported to MRI 

## 2022-01-05 NOTE — Progress Notes (Signed)
?   01/05/22 1700  ?Clinical Encounter Type  ?Visited With Patient and family together  ?Visit Type Code  ?Spiritual Encounters  ?Spiritual Needs Emotional;Prayer  ? ?Chaplain responded to code stroke and provided support to patient and daughter through presence and prayer. ?

## 2022-01-05 NOTE — Consult Note (Signed)
CODE STROKE- PHARMACY COMMUNICATION ? ? ?Time CODE STROKE called/page received:1634 ? ?Time response to CODE STROKE was made (in person or via phone):  ? ?Time Stroke Kit retrieved from [place] (only if needed):NOT INDICATED ? ?Name of Provider/Nurse [place] RN ? ?Past Medical History:  ?Diagnosis Date  ? Anemia   ? Anxiety   ? Asthma   ? Breast fibrocystic disorder 2013  ? Edema 09/15/2017  ? Hernia 2011  ? chest wall  ? Iron deficiency anemia 09/15/2017  ? Lung mass 2011  ? Sarcoidosis   ? Shortness of breath   ? 2011  ? Tachycardia, unspecified 09/15/2017  ? ?Prior to Admission medications   ?Medication Sig Start Date End Date Taking? Authorizing Provider  ?acetaminophen (TYLENOL) 500 MG tablet Take 500 mg by mouth every 6 (six) hours as needed.    [provider]  ?albuterol (VENTOLIN HFA) 108 (90 Base) MCG/ACT inhaler Inhale 2 puffs into the lungs every 6 (six) hours as needed for wheezing or shortness of breath. 08/09/21   Jonetta Osgood, NP  ?amLODipine (NORVASC) 2.5 MG tablet Take 1 tablet (2.5 mg total) by mouth daily. 08/09/21   Jonetta Osgood, NP  ?aspirin EC 81 MG EC tablet Take 1 tablet (81 mg total) by mouth daily. Swallow whole. 09/14/20   Lorella Nimrod, MD  ?augmented betamethasone dipropionate (DIPROLENE-AF) 0.05 % ointment Apply topically 2 (two) times daily. Until resolved 08/09/21   Jonetta Osgood, NP  ?azaTHIOprine (IMURAN) 50 MG tablet Take 1 tablet (50 mg total) by mouth daily. 08/09/21   Jonetta Osgood, NP  ?bisoprolol-hydrochlorothiazide (ZIAC) 10-6.25 MG tablet TAKE 1 TABLET BY MOUTH EVERY DAY IN THE MORNING FOR BLOOD PRESSURE 08/09/21   Jonetta Osgood, NP  ?busPIRone (BUSPAR) 10 MG tablet TAKE 1 TABLET BY MOUTH TWICE A DAY 09/09/21   Jonetta Osgood, NP  ?celecoxib (CELEBREX) 200 MG capsule Take 1 capsule (200 mg total) by mouth daily. 03/29/21   Jonetta Osgood, NP  ?clonazePAM (KLONOPIN) 0.5 MG tablet Take 1/2 tab once a day for a week then  increase to twice a day 12/30/21   McDonough, Si Gaul, PA-C  ?dicyclomine (BENTYL) 20 MG tablet TAKE 1 TABLET (20 MG TOTAL) BY MOUTH 4 (FOUR) TIMES DAILY - BEFORE MEALS AND AT BEDTIME. 11/12/21   Jonetta Osgood, NP  ?DULoxetine (CYMBALTA) 60 MG capsule TAKE 1 CAPSULE BY MOUTH EVERY DAY 04/22/21   Lavera Guise, MD  ?fluticasone-salmeterol (ADVAIR) 100-50 MCG/ACT AEPB Inhale 1 puff into the lungs 2 (two) times daily. 08/09/21   Jonetta Osgood, NP  ?hydroxychloroquine (PLAQUENIL) 200 MG tablet Take 1 tablet (200 mg total) by mouth 2 (two) times daily. 08/09/21   Jonetta Osgood, NP  ?metoCLOPramide (REGLAN) 5 MG tablet TAKE 1 TABLET (5 MG TOTAL) BY MOUTH 4 (FOUR) TIMES DAILY - BEFORE MEALS AND AT BEDTIME. 01/03/22   Jonetta Osgood, NP  ?mirtazapine (REMERON) 30 MG tablet Take 1 tablet (30 mg total) by mouth at bedtime. 08/09/21   Jonetta Osgood, NP  ?predniSONE (DELTASONE) 5 MG tablet TAKE 2 TABLETS BY MOUTH DAILY WITH BREAKFAST. 11/21/21   Jonetta Osgood, NP  ?zolpidem (AMBIEN) 10 MG tablet Take 1 tablet (10 mg total) by mouth at bedtime as needed for sleep. 10/28/21   Jonetta Osgood, NP  ? ? ?Moriah Loughry Rodriguez-Guzman PharmD, BCPS ?01/05/2022 4:56 PM ? ?

## 2022-01-05 NOTE — Assessment & Plan Note (Signed)
Cont Cymbalta and buspar.  ? ?

## 2022-01-05 NOTE — Assessment & Plan Note (Addendum)
Blood pressure 126/81, pulse 85, temperature 97.7 ?F (36.5 ?C), temperature source Oral, resp. rate 16, height 5\' 6"  (1.676 m), weight 78 kg, SpO2 99 %. ?cont ziac held amlodipine. Pt has already taken her ziac in am. ? if her MIR is positive will d/c it and allow for permissive htn. ?

## 2022-01-05 NOTE — ED Notes (Signed)
PT REPORTS THAT SHE FORGOT TO INFORM MD THAT SHE HAD 2 EPISODES OF STOOL INCONTINENCE WHILE SLEEPING OVER THE LAST 3 WEEKS. PT ALSO REPORTS THAT OVER THE LAST 6 MONTHS THAT SHE HAS NOT BEEN ABLE TO EMPTY BLADDER AND HAS HAD INCREASED URINATION. PT REPORTS INCREASED WEAKNESS AND FATIGUE OVER THE 6 MONTHS AS WELL.  ? ?MD MADE AWARE  ?

## 2022-01-05 NOTE — ED Notes (Signed)
Teleneurology activated 

## 2022-01-05 NOTE — ED Provider Notes (Signed)
? ?[facility] ?Provider Note ? ? ? Event Date/Time  ? First MD Initiated Contact with Patient 01/05/22 1638   ?  (approximate) ? ? ?History  ? ?Numbness ? ? ?HPI ? ?Brittany Huynh is a 56 y.o. female who reports onset of numbness and tingling to the left side of the body face arm and leg at about 3:00.  Patient with no weakness normal speech normal gait.  She has not had this before. ? ?  ?Past medical history: ?Non-Hospital ?Posttraumatic hematoma of left breast ?Sarcoidosis of lung (HCC) ?Migraine ?Inflammatory polyarthritis (HCC) ?Myopathy ?Long term current use of systemic steroids ?Gastro-esophageal reflux disease without esophagitis ?Hypersomnia, unspecified ?Neuralgia and neuritis ?Restless leg syndrome ?Generalized anxiety disorder ?History of chickenpox ?Shingles ?Essential hypertension ?Polyneuropathic pain ?Acute vaginitis ?Unspecified menopausal and perimenopausal disorder ?Vitamin D deficiency ?Other atopic dermatitis ?Asthma ?Arthralgia ?Osteopenia of lumbar spine ?Pneumonia due to COVID-19 virus ?Physical Exam  ? ?Triage Vital Signs: ?ED Triage Vitals  ?Enc Vitals Group  ?   BP 01/05/22 1618 (!) 142/92  ?   Pulse Rate 01/05/22 1618 79  ?   Resp 01/05/22 1618 20  ?   Temp 01/05/22 1618 97.7 ?F (36.5 ?C)  ?   Temp Source 01/05/22 1618 Oral  ?   SpO2 01/05/22 1618 97 %  ?   Weight 01/05/22 1619 172 lb (78 kg)  ?   Height 01/05/22 1619 5\' 6"  (1.676 m)  ?   Head Circumference --   ?   Peak Flow --   ?   Pain Score 01/05/22 1619 6  ?   Pain Loc --   ?   Pain Edu? --   ?   Excl. in GC? --   ? ? ?Most recent vital signs: ?Vitals:  ? 01/05/22 1730 01/05/22 1735  ?BP: 126/81   ?Pulse: 93 85  ?Resp: 18 16  ?Temp:    ?SpO2: 100% 99%  ? ? ? ?General: Awake, no distress.  ?Head normocephalic atraumatic ?Eyes pupils equal round reactive extraocular movements intact ?CV:  Good peripheral perfusion.  Heart regular rate and rhythm no audible murmurs ?Resp:  Normal effort.  ?Abd:  No distention.   Soft and nontender ?Extremities: No edema ?Neuro exam cranial nerves II through XII are intact except for the left-sided facial numbness motor strength is 5/5 throughout sensation and only significant for left-sided numbness cerebellar finger-nose rapid alternating movements in the hands and heel-to-shin are normal ? ? ?ED Results / Procedures / Treatments  ? ?Labs ?(all labs ordered are listed, but only abnormal results are displayed) ?Labs Reviewed  ?COMPREHENSIVE METABOLIC PANEL - Abnormal; Notable for the following components:  ?    Result Value  ? Glucose, Bld 111 (*)   ? All other components within normal limits  ?PROTIME-INR  ?APTT  ?CBC  ?DIFFERENTIAL  ?LIPID PANEL  ?HEMOGLOBIN A1C  ?CBG MONITORING, ED  ?I-STAT CREATININE, ED  ?POC URINE PREG, ED  ? ? ? ?EKG ? ?EKG read and interpreted by me shows normal sinus rhythm rate of 79 normal axis no acute ST-T wave changes patient does have flipped T waves in V1 through 3 but these were present on an EKG done on 5 4 of this year. ? ? ?RADIOLOGY ?Head CT read by radiology reviewed by me shows no acute disease. ?CT angio head and neck and perfusion scan read by radiology is negative.  I reviewed the films. ?PROCEDURES: ? ?Critical Care performed: Critical care time 10 minutes.  This includes seeing the patient discussing the patient with the teleneurologist and reviewing the studies.  Also I am talking to the hospitalist. ? ?Procedures ? ? ?MEDICATIONS ORDERED IN ED: ?Medications  ?sodium chloride flush (NS) 0.9 % injection 3 mL (has no administration in time range)  ?iohexol (OMNIPAQUE) 350 MG/ML injection 75 mL (100 mLs Intravenous Contrast Given 01/05/22 1712)  ? ? ? ?IMPRESSION / MDM / ASSESSMENT AND PLAN / ED COURSE  ?I reviewed the triage vital signs and the nursing notes. ? ?Patient with sudden onset left-sided numbness.  Radiologist does not see any acute pathology on the CTs.  MRI is pending.  Neuro feels that the patient needs a stroke work-up but I agree.   We will get the patient admitted for the echo and monitoring etc.  We also have to get the MRI of course. ? ? ?The patient is on the cardiac monitor to evaluate for evidence of arrhythmia and/or significant heart rate changes.  None have been seen ? ?  ? ? ?FINAL CLINICAL IMPRESSION(S) / ED DIAGNOSES  ? ?Final diagnoses:  ?Left sided numbness  ? ? ? ?Rx / DC Orders  ? ?ED Discharge Orders   ? ? None  ? ?  ? ? ? ?Note:  This document was prepared using Dragon voice recognition software and may include unintentional dictation errors. ?  ?Arnaldo NatalMalinda, Alitza Cowman F, MD ?01/05/22 1804 ? ?

## 2022-01-05 NOTE — Assessment & Plan Note (Signed)
Stable . ?No oxygen at baseline. ?Being followed by Dr. Chancy Milroy. ? ?

## 2022-01-05 NOTE — ED Notes (Signed)
Returned from CT.

## 2022-01-05 NOTE — Assessment & Plan Note (Signed)
Pt presenting with paresthesia affecting left side of her face and LUE and LLE. ?Neurology on board and MRI of Brain ordered along with C spine.  ?We will check b12 level.  ?Pt also needs to be evaluated for vit d def.  ?D/d also include side effect of her long term prednisone.  ?

## 2022-01-06 ENCOUNTER — Other Ambulatory Visit: Payer: BC Managed Care – PPO

## 2022-01-06 ENCOUNTER — Observation Stay: Payer: BC Managed Care – PPO

## 2022-01-06 DIAGNOSIS — R202 Paresthesia of skin: Secondary | ICD-10-CM | POA: Diagnosis not present

## 2022-01-06 DIAGNOSIS — M5416 Radiculopathy, lumbar region: Secondary | ICD-10-CM | POA: Diagnosis not present

## 2022-01-06 LAB — COMPREHENSIVE METABOLIC PANEL
ALT: 17 U/L (ref 0–44)
AST: 17 U/L (ref 15–41)
Albumin: 3.3 g/dL — ABNORMAL LOW (ref 3.5–5.0)
Alkaline Phosphatase: 74 U/L (ref 38–126)
Anion gap: 4 — ABNORMAL LOW (ref 5–15)
BUN: 16 mg/dL (ref 6–20)
CO2: 30 mmol/L (ref 22–32)
Calcium: 8.7 mg/dL — ABNORMAL LOW (ref 8.9–10.3)
Chloride: 107 mmol/L (ref 98–111)
Creatinine, Ser: 0.88 mg/dL (ref 0.44–1.00)
GFR, Estimated: >60 mL/min (ref >60)
Glucose, Bld: 124 mg/dL — ABNORMAL HIGH (ref 70–99)
Potassium: 3 mmol/L — ABNORMAL LOW (ref 3.5–5.1)
Sodium: 141 mmol/L (ref 135–145)
Total Bilirubin: 0.5 mg/dL (ref 0.3–1.2)
Total Protein: 6.2 g/dL — ABNORMAL LOW (ref 6.5–8.1)

## 2022-01-06 LAB — HIV ANTIBODY (ROUTINE TESTING W REFLEX): HIV Screen 4th Generation wRfx: NONREACTIVE

## 2022-01-06 LAB — CBC
HCT: 39.8 % (ref 36.0–46.0)
Hemoglobin: 12.5 g/dL (ref 12.0–15.0)
MCH: 27.9 pg (ref 26.0–34.0)
MCHC: 31.4 g/dL (ref 30.0–36.0)
MCV: 88.8 fL (ref 80.0–100.0)
Platelets: 177 10*3/uL (ref 150–400)
RBC: 4.48 MIL/uL (ref 3.87–5.11)
RDW: 13.6 % (ref 11.5–15.5)
WBC: 7.7 10*3/uL (ref 4.0–10.5)
nRBC: 0 % (ref 0.0–0.2)

## 2022-01-06 LAB — HEMOGLOBIN A1C
Hgb A1c MFr Bld: 6 % — ABNORMAL HIGH (ref 4.8–5.6)
Mean Plasma Glucose: 125.5 mg/dL

## 2022-01-06 LAB — VITAMIN B12: Vitamin B-12: 285 pg/mL (ref 180–914)

## 2022-01-06 MED ORDER — METOPROLOL TARTRATE 5 MG/5ML IV SOLN: 5.0000 mg | INTRAVENOUS | Status: DC | PRN

## 2022-01-06 MED ORDER — POTASSIUM CHLORIDE CRYS ER 20 MEQ PO TBCR
40.0000 meq | EXTENDED_RELEASE_TABLET | Freq: Once | ORAL | Status: AC
  Administered 2022-01-06: 40 meq via ORAL
  Filled 2022-01-06: qty 2

## 2022-01-06 MED ORDER — SENNOSIDES-DOCUSATE SODIUM 8.6-50 MG PO TABS: 1.0000 | ORAL_TABLET | Freq: Every evening | ORAL | Status: DC | PRN

## 2022-01-06 MED ORDER — TRAZODONE HCL 50 MG PO TABS: 50.0000 mg | ORAL_TABLET | Freq: Every evening | ORAL | Status: DC | PRN

## 2022-01-06 MED ORDER — VITAMIN B-12 1000 MCG PO TABS: 1000.0000 ug | ORAL_TABLET | Freq: Every day | ORAL | Status: DC

## 2022-01-06 MED ORDER — ATORVASTATIN CALCIUM 10 MG PO TABS: 10.0000 mg | ORAL_TABLET | Freq: Every day | ORAL | 0 refills | Status: DC

## 2022-01-06 MED ORDER — CYANOCOBALAMIN 1000 MCG/ML IJ SOLN
1000.0000 ug | Freq: Once | INTRAMUSCULAR | Status: DC
  Filled 2022-01-06: qty 1

## 2022-01-06 MED ORDER — CYANOCOBALAMIN 1000 MCG PO TABS: 1000.0000 ug | ORAL_TABLET | Freq: Every day | ORAL | 0 refills | Status: DC

## 2022-01-06 MED ORDER — HYDRALAZINE HCL 20 MG/ML IJ SOLN: 10.0000 mg | INTRAMUSCULAR | Status: DC | PRN

## 2022-01-06 MED ORDER — ATORVASTATIN CALCIUM 20 MG PO TABS: 10.0000 mg | ORAL_TABLET | Freq: Every day | ORAL | Status: DC

## 2022-01-06 NOTE — Consult Note (Signed)
Late entry:  01/05/22 1633 stroke cart activated.  Pt in CT at time of activation with report provided by nurse.  [place] message to Neuro.  [place] [address] Royal Hawthorn joins cart.   ?

## 2022-01-06 NOTE — Evaluation (Signed)
Physical Therapy Evaluation ?Patient Details ?Name: Brittany Huynh ?MRN: [id number] ?DOB: [birth year] ?Today's Date: 01/06/2022 ? ?History of Present Illness ? 56 y.o. female with medical history significant of tingling affecting the left side of her face left side of her upper extremity on the lateral aspect of the left leg.  Her symptoms started when she was at work when she developed numbness in her hands when she dropped a close that she started having some numbness at which point she called her supervisor and came to the emergency room at about 3:00 her symptoms lasted until about 3 3340.  The patient reports numbness and tingling and seeing neurologist I suspect she has degenerative issues.  Patient is chronically on steroids and also is on azathioprine along with suspect she may have underlying neuropathy etiology.  Patient also has a vague history with neuropathy and sees neurologist.  ?Clinical Impression ? Pt reports that symptoms have almost entirely resolved apart from minimal numbness/weakness in L distal LE. She was able to ambulate in the hallway and negotiate steps w/o assist but did have mild limp/hesitation on the L.  She did not have any overt LOBs and while she is not at her baseline she should be safe to go home, though she will benefit from outpt PT to insure maximizing return to independent baseline function.  ?   ? ?Recommendations for follow up therapy are one component of a multi-disciplinary discharge planning process, led by the attending physician.  Recommendations may be updated based on patient status, additional functional criteria and insurance authorization. ? ?Follow Up Recommendations Outpatient PT ? ?  ?Assistance Recommended at Discharge Intermittent Supervision/Assistance  ?Patient can return home with the following ?   ? ?  ?Equipment Recommendations None recommended by PT  ?Recommendations for Other Services ?    ?  ?Functional Status Assessment Patient has had a recent decline  in their functional status and demonstrates the ability to make significant improvements in function in a reasonable and predictable amount of time.  ? ?  ?Precautions / Restrictions Precautions ?Precautions: None ?Restrictions ?Weight Bearing Restrictions: No  ? ?  ? ?Mobility ? Bed Mobility ?Overal bed mobility: Independent ?  ?  ?  ?  ?  ?  ?  ?  ? ?Transfers ?Overall transfer level: Independent ?Equipment used: None ?  ?  ?  ?  ?  ?  ?  ?General transfer comment: Pt able to rise to standing and maintain balance w/o issue, appropriate use of UEs to rise ?  ? ?Ambulation/Gait ?Ambulation/Gait assistance: Supervision ?Gait Distance (Feet): 250 Feet ?Assistive device: None ?  ?  ?  ?  ?General Gait Details: Pt able to maintain balance and consistent cadence w/o AD or physical assist, however she did display some hesitancy with L WBing acceptance and knee mechanics.  No buckling or LOBs but subjectively slower than her normal and clearly with some guarding but no need for UE use. ? ?Stairs ?Stairs: Yes ?Stairs assistance: Supervision ?Stair Management: One rail Right, Step to pattern ?Number of Stairs: 12 ?General stair comments: Pt able to safely negotiate up/down steps, she did need step-to strategy and definite UE use. ? ?Wheelchair Mobility ?  ? ?Modified Rankin (Stroke Patients Only) ?  ? ?  ? ?Balance Overall balance assessment: Modified Independent (able to maintain feet together, eyes closed and even accept moderate perturbations with appropriate righting responses and no LOBs) ?  ?  ?  ?  ?  ?  ?  ?  ?  ?  ?  ?  ?  ?  ?  ?  ?  ?  ?   ? ? ? ?  Pertinent Vitals/Pain Pain Assessment ?Pain Assessment: No/denies pain  ? ? ?Home Living Family/patient expects to be discharged to:: Private residence ?Living Arrangements: Alone ?Available Help at Discharge:  (daughters) ?Type of Home: House ?Home Access: Stairs to enter ?  ?Entrance Stairs-Number of Steps: 3-4 ?Alternate Level Stairs-Number of Steps: flight ?Home  Layout: Two level ?Home Equipment: None ?   ?  ?Prior Function Prior Level of Function : Independent/Modified Independent;Working/employed;Driving ?  ?  ?  ?  ?  ?  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  ? ? ?Hand Dominance  ? Dominant Hand: Right ? ?  ?Extremity/Trunk Assessment  ? Upper Extremity Assessment ?Upper Extremity Assessment: Overall WFL for tasks assessed ?  ? ?Lower Extremity Assessment ?Lower Extremity Assessment: Overall WFL for tasks assessed;LLE deficits/detail ?LLE Deficits / Details: Pt is reporting numbness from knee to toes ?  ? ?Cervical / Trunk Assessment ?Cervical / Trunk Assessment: Normal  ?Communication  ? Communication: No difficulties  ?Cognition Arousal/Alertness: Awake/alert ?Behavior During Therapy: [facility] for tasks assessed/performed ?Overall Cognitive Status: Within Functional Limits for tasks assessed ?  ?  ?  ?  ?  ?  ?  ?  ?  ?  ?  ?  ?  ?  ?  ?  ?  ?  ?  ? ?  ?General Comments General comments (skin integrity, edema, etc.): Pt able to do basic mobility/gait w/o assist but certainly not at her baseline ? ?  ?Exercises    ? ?Assessment/Plan  ?  ?PT Assessment Patient needs continued PT services  ?PT Problem List Decreased strength;Decreased activity tolerance;Decreased balance;Decreased mobility;Decreased safety awareness ? ?   ?  ?PT Treatment Interventions Gait training;Stair training;Functional mobility training;Therapeutic exercise;Therapeutic activities;Balance training;Patient/family education   ? ?PT Goals (Current goals can be found in the Care Plan section)  ?Acute Rehab PT Goals ?Patient Stated Goal: go home ?PT Goal Formulation: With patient ?Time For Goal Achievement: 01/20/22 ?Potential to Achieve Goals: Good ? ?  ?Frequency Min 2X/week ?  ? ? ?Co-evaluation   ?  ?  ?  ?  ? ? ?  ?AM-PAC PT "6 Clicks" Mobility  ?Outcome Measure Help needed turning from your back to  your side while in a flat bed without using bedrails?: None ?Help needed moving from lying on your back to sitting on the side of a flat bed without using bedrails?: None ?Help needed moving to and from a bed to a chair (including a wheelchair)?: None ?Help needed standing up from a chair using your arms (e.g., wheelchair or bedside chair)?: None ?Help needed to walk in hospital room?: None ?Help needed climbing 3-5 steps with a railing? : None ?6 Click Score: 24 ? ?  ?End of Session Equipment Utilized During Treatment: Gait belt ?Activity Tolerance: Patient tolerated treatment well ?Patient left: with call bell/phone within reach;in chair ?Nurse Communication: Mobility status ?PT Visit Diagnosis: Muscle weakness (generalized) (M62.81);Other symptoms and signs involving the nervous system (R29.898);Other abnormalities of gait and mobility (R26.89) ?  ? ?Time: 7672-0947 ?PT Time Calculation (min) (ACUTE ONLY): 25 min ? ? ?Charges:   PT Evaluation ?$PT Eval Low Complexity: 1 Low ?PT Treatments ?$Gait Training: 8-22 mins ?  ?   ? ? ?Malachi Pro, DPT ?01/06/2022, 12:31 PM ? ?

## 2022-01-06 NOTE — Evaluation (Signed)
Occupational Therapy Evaluation ?Patient Details ?Name: Brittany Huynh ?MRN: [id number] ?DOB: November 15, 1965 ?Today's Date: 01/06/2022 ? ? ?History of Present Illness 56 y.o. female with medical history significant of tingling affecting the left side of her face left side of her upper extremity on the lateral aspect of the left leg.  Her symptoms started when she was at work when she developed numbness in her hands when she dropped a close that she started having some numbness at which point she called her supervisor and came to the emergency room at about 3:00 her symptoms lasted until about 3 3340.  The patient reports numbness and tingling and seeing neurologist I suspect she has degenerative issues.  Patient is chronically on steroids and also is on azathioprine along with suspect she may have underlying neuropathy etiology.  Patient also has a vague history with neuropathy and sees neurologist.  ? ?Clinical Impression ?  ?Upon entering the room, pt seated in recliner chair and agreeable to OT evaluation. Pt reports living at home alone, working full time as a Quarry manager, and being independent in all aspects of care. Pt reports symptoms have cleared but still having feelings of numbness in L LE from knee to toes. Pt demonstrates functional tasks within the room for self care and mobility independently. Coordination and vision appears to be WNLs. Pt does not need any additional equipment or skilled OT intervention at this time. Children check in on her daily. OT to SIGN OFF at this time.  ?   ? ?Recommendations for follow up therapy are one component of a multi-disciplinary discharge planning process, led by the attending physician.  Recommendations may be updated based on patient status, additional functional criteria and insurance authorization.  ? ?Follow Up Recommendations ? No OT follow up  ?  ?Assistance Recommended at Discharge None  ?   ?Functional Status Assessment ? Patient has not had a recent decline in their  functional status  ?Equipment Recommendations ? None recommended by OT  ?  ?   ?Precautions / Restrictions Precautions ?Precautions: None  ? ?  ? ?Mobility Bed Mobility ?  ?  ?  ?  ?  ?  ?  ?General bed mobility comments: seated in recliner chair ?  ? ?Transfers ?Overall transfer level: Independent ?Equipment used: None ?  ?  ?  ?  ?  ?  ?  ?  ?  ? ?  ?Balance Overall balance assessment: Independent ?  ?  ?  ?  ?  ?  ?  ?  ?  ?  ?  ?  ?  ?  ?  ?  ?  ?  ?   ? ?ADL either performed or assessed with clinical judgement  ? ?ADL Overall ADL's : Needs assistance/impaired ?Eating/Feeding: Independent ?  ?Grooming: Wash/dry hands;Wash/dry face;Standing;Independent ?  ?  ?  ?  ?  ?  ?  ?Lower Body Dressing: Independent ?  ?Toilet Transfer: Independent ?  ?  ?  ?  ?  ?Functional mobility during ADLs: Independent ?   ? ? ? ?Vision Patient Visual Report: No change from baseline ?   ?   ?   ?   ? ?Pertinent Vitals/Pain Pain Assessment ?Pain Assessment: No/denies pain  ? ? ? ?Hand Dominance Right ?  ?Extremity/Trunk Assessment Upper Extremity Assessment ?Upper Extremity Assessment: Overall WFL for tasks assessed ?  ?Lower Extremity Assessment ?Lower Extremity Assessment: Overall WFL for tasks assessed;LLE deficits/detail ?LLE Deficits / Details: Pt is reporting numbness  from knee to toes ?  ?Cervical / Trunk Assessment ?Cervical / Trunk Assessment: Normal ?  ?Communication Communication ?Communication: No difficulties ?  ?Cognition Arousal/Alertness: Awake/alert ?Behavior During Therapy: [facility] for tasks assessed/performed ?Overall Cognitive Status: Within Functional Limits for tasks assessed ?  ?  ?  ?  ?  ?  ?  ?  ?  ?  ?  ?  ?  ?  ?  ?  ?  ?  ?  ?   ?   ?   ? ? ?Home Living Family/patient expects to be discharged to:: Private residence ?Living Arrangements: Alone ?Available Help at Discharge:  (daughters) ?Type of Home: House ?Home Access: Stairs to enter ?Entrance Stairs-Number of Steps: 3-4 ?  ?Home Layout: One level ?  ?   ?Bathroom Shower/Tub: Tub/shower unit ?  ?  ?  ?  ?Home Equipment: None ?  ?  ?  ? ?  ?Prior Functioning/Environment Prior Level of Function : Independent/Modified Independent;Working/employed;Driving ?  ?  ?  ?  ?  ?  ?  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  ? ?  ?  ?   ?   ?   ?OT Goals(Current goals can be found in the care plan section) Acute Rehab OT Goals ?Patient Stated Goal: to go home ?OT Goal Formulation: With patient ?Time For Goal Achievement: 01/06/22 ?Potential to Achieve Goals: Fair  ?OT Frequency:   ?  ? ?   ?AM-PAC OT "6 Clicks" Daily Activity     ?Outcome Measure Help from another person eating meals?: None ?Help from another person taking care of personal grooming?: None ?Help from another person toileting, which includes using toliet, bedpan, or urinal?: None ?Help from another person bathing (including washing, rinsing, drying)?: None ?Help from another person to put on and taking off regular upper body clothing?: None ?Help from another person to put on and taking off regular lower body clothing?: None ?6 Click Score: 24 ?  ?End of Session Nurse Communication: Mobility status ? ?Activity Tolerance: Patient tolerated treatment well ?Patient left: in chair ? ?   ?              ?Time: [id number] ?OT Time Calculation (min): 13 min ?Charges:  OT General Charges ?$OT Visit: 1 Visit ?OT Evaluation ?$OT Eval Low Complexity: 1 Low ? ?Darleen Crocker, [place], OTR/L , CBIS ?ascom [phone]  ?01/06/22, 1:09 PM  ?

## 2022-01-06 NOTE — Discharge Summary (Addendum)
Physician Discharge Summary  ?Brittany Huynh EPP:[id number] DOB: 1966-01-31 DOA: 01/05/2022 ? ?PCP: [name], NP ? ?Admit date: 01/05/2022 ?Discharge date: 01/06/2022 ? ?Admitted From: Home ?Disposition: Home ? ?Recommendations for Outpatient Follow-up:  ?Follow up with PCP in 1-2 weeks ?Please obtain BMP/CBC in one week your next doctors visit.  ?No need for aspirin at this time ?Follow-up outpatient Dr. Hale Bogus ?Recommend outpatient EMG/nerve conduction study ?Oral B12 supplementation ordered ?Atorvastatin 10 mg orally daily started ?Follow-up outpatient neurosurgery, referral given.  Dr. Marcell Barlow service will help arrange for outpatient follow-up ? ? ?Discharge Condition: Stable ?CODE STATUS: Full ?Diet recommendation: Heart healthy ? ?Brief/Interim Summary: ?56 year old with history of essential hypertension, GAD, GERD, sarcoidosis of lung comes to the hospital with complaints of left upper and lower extremity numbness and tingling.  By the time she came to the hospital her upper extremity symptoms had resolved.  She underwent extensive work-up including CTA head and neck, MRI brain which were both unremarkable.  MRI cervical spine showed some cervical canal stenosis without any compressive evidence.  LDL was 131, A1c was 6.0.  She was seen by neurology who recommended discontinuing aspirin, starting B12 supplementation and outpatient EMG/nerve conduction study.  She was also started on Lipitor 10 mg daily. ?MRI lumbar spine showed disc protrusion at multiple levels therefore recommended outpatient follow-up with neurosurgery.  Image was briefly reviewed with neurosurgery, Dr. Marcell Barlow.  She would benefit from outpatient neurosurgery follow-up ? ? ?Assessment and Plan: ?Left upper and lower extremity weakness and numbness ?Cervical canal stenosis ?Stroke work-up including CT of the head, MRI brain and CTA head and neck is negative.  MRI cervical spine shows some cervical canal stenosis therefore she has  been advised to follow-up outpatient neurosurgery.  Currently she does not have any left upper extremity numbness or weakness. ?-MRI of the lumbar spine shows disc protrusion at multiple levels, briefly discussed this with neurosurgery, Dr. Marcell Barlow, recommend outpatient follow-up.  Their service will help arrange for follow-up.  Appreciate his input. ?LDL 131 therefore started on Lipitor orally daily ?A1c 6.0. ?Will need outpatient EMG/nerve conduction study ? ?Hand weakness ?Resolved.  ? ?Essential hypertension ?Can resume home regimen ? ?Generalized anxiety disorder ?Cont Cymbalta and buspar.  ? ? ?Gastro-esophageal reflux disease without esophagitis ?PPI ? ? ?Sarcoidosis of lung (HCC) ?On daily prednisone.  Follow-up outpatient ? ? ? ? ? ?  ?Body mass index is 27.75 kg/m?. ? ?  ? ? ? ?Discharge Diagnoses:  ?Principal Problem: ?  Paresthesia ?Active Problems: ?  Sarcoidosis of lung (HCC) ?  Gastro-esophageal reflux disease without esophagitis ?  Generalized anxiety disorder ?  Essential hypertension ?  Hand weakness ? ? ? ? ? ?Consultations: ?Neurology ?Neurosurgery, Dr. Marcell Barlow curb sided ? ?Subjective: ?Feels great no complaints ?Has very mild left lower extremity slight tingling on the lateral aspect of her bottom foot. ? ?Discharge Exam: ?Vitals:  ? 01/06/22 0536 01/06/22 0843  ?BP: 109/76 (!) 147/92  ?Pulse: 83 73  ?Resp: 17   ?Temp: 98.2 ?F (36.8 ?C) 98.1 ?F (36.7 ?C)  ?SpO2: 98% 100%  ? ?Vitals:  ? 01/05/22 2227 01/05/22 2301 01/06/22 0536 01/06/22 0843  ?BP: (!) 177/91 (!) 179/106 109/76 (!) 147/92  ?Pulse: 68 64 83 73  ?Resp: 15 18 17    ?Temp: 97.6 ?F (36.4 ?C) 97.6 ?F (36.4 ?C) 98.2 ?F (36.8 ?C) 98.1 ?F (36.7 ?C)  ?TempSrc: Oral   Oral  ?SpO2: 97% 99% 98% 100%  ?Weight:   78 kg   ?Height:      ? ? ?  General: Pt is alert, awake, not in acute distress ?Cardiovascular: RRR, S1/S2 +, no rubs, no gallops ?Respiratory: CTA bilaterally, no wheezing, no rhonchi ?Abdominal: Soft, NT, ND, bowel sounds  + ?Extremities: no edema, no cyanosis ? ?Discharge Instructions ? ? ?Allergies as of 01/06/2022   ? ?   Reactions  ? Onion Anaphylaxis  ? Adhesive [tape] Rash  ? ?  ? ?  ?Medication List  ?  ? ?STOP taking these medications   ? ?aspirin 81 MG EC tablet ?  ? ?  ? ?TAKE these medications   ? ?acetaminophen 500 MG tablet ?Commonly known as: TYLENOL ?Take 500 mg by mouth every 6 (six) hours as needed. ?  ?albuterol 108 (90 Base) MCG/ACT inhaler ?Commonly known as: VENTOLIN HFA ?Inhale 2 puffs into the lungs every 6 (six) hours as needed for wheezing or shortness of breath. ?  ?amLODipine 2.5 MG tablet ?Commonly known as: NORVASC ?Take 1 tablet (2.5 mg total) by mouth daily. ?  ?atorvastatin 10 MG tablet ?Commonly known as: LIPITOR ?Take 1 tablet (10 mg total) by mouth at bedtime. ?  ?augmented betamethasone dipropionate 0.05 % ointment ?Commonly known as: DIPROLENE-AF ?Apply topically 2 (two) times daily. Until resolved ?  ?azaTHIOprine 50 MG tablet ?Commonly known as: IMURAN ?Take 1 tablet (50 mg total) by mouth daily. ?  ?bisoprolol-hydrochlorothiazide 10-6.25 MG tablet ?Commonly known as: ZIAC ?TAKE 1 TABLET BY MOUTH EVERY DAY IN THE MORNING FOR BLOOD PRESSURE ?  ?busPIRone 10 MG tablet ?Commonly known as: BUSPAR ?TAKE 1 TABLET BY MOUTH TWICE A DAY ?  ?celecoxib 200 MG capsule ?Commonly known as: CELEBREX ?Take 1 capsule (200 mg total) by mouth daily. ?  ?clonazePAM 0.5 MG tablet ?Commonly known as: KLONOPIN ?Take 1/2 tab once a day for a week then increase to twice a day ?What changed:  ?how much to take ?how to take this ?when to take this ?additional instructions ?  ?cyanocobalamin 1000 MCG tablet ?Take 1 tablet (1,000 mcg total) by mouth daily. ?Start taking on: Jan 07, 2022 ?  ?dicyclomine 20 MG tablet ?Commonly known as: BENTYL ?TAKE 1 TABLET (20 MG TOTAL) BY MOUTH 4 (FOUR) TIMES DAILY - BEFORE MEALS AND AT BEDTIME. ?  ?DULoxetine 60 MG capsule ?Commonly known as: CYMBALTA ?TAKE 1 CAPSULE BY MOUTH EVERY  DAY ?What changed: how much to take ?  ?fluticasone-salmeterol 100-50 MCG/ACT Aepb ?Commonly known as: ADVAIR ?Inhale 1 puff into the lungs 2 (two) times daily. ?  ?hydroxychloroquine 200 MG tablet ?Commonly known as: PLAQUENIL ?Take 1 tablet (200 mg total) by mouth 2 (two) times daily. ?  ?metoCLOPramide 5 MG tablet ?Commonly known as: REGLAN ?TAKE 1 TABLET (5 MG TOTAL) BY MOUTH 4 (FOUR) TIMES DAILY - BEFORE MEALS AND AT BEDTIME. ?  ?mirtazapine 30 MG tablet ?Commonly known as: REMERON ?Take 1 tablet (30 mg total) by mouth at bedtime. ?  ?predniSONE 5 MG tablet ?Commonly known as: DELTASONE ?TAKE 2 TABLETS BY MOUTH DAILY WITH BREAKFAST. ?  ?zolpidem 10 MG tablet ?Commonly known as: AMBIEN ?Take 1 tablet (10 mg total) by mouth at bedtime as needed for sleep. ?  ? ?  ? ? Follow-up Information   ? ? [name], NP Follow up in 1 week(s).   ?Specialty: Nurse Practitioner ?Contact information: ?[address] ?[place] [address] ?[phone] ? ? ?  ?  ? ?  ?  ? ?  ? ?Allergies  ?Allergen Reactions  ? Onion Anaphylaxis  ? Adhesive [Tape] Rash  ? ? ?  You were cared for by a hospitalist during your hospital stay. If you have any questions about your discharge medications or the care you received while you were in the hospital after you are discharged, you can call the unit and asked to speak with the hospitalist on call if the hospitalist that took care of you is not available. Once you are discharged, your primary care physician will handle any further medical issues. Please note that no refills for any discharge medications will be authorized once you are discharged, as it is imperative that you return to your primary care physician (or establish a relationship with a primary care physician if you do not have one) for your aftercare needs so that they can reassess your need for medications and monitor your lab values. ? ? ?Procedures/Studies: ?MR BRAIN WO CONTRAST ? ?Result Date: 01/05/2022 ?CLINICAL DATA:  Left  head and face and left hand weakness EXAM: MRI HEAD WITHOUT CONTRAST MRI CERVICAL SPINE WITHOUT CONTRAST TECHNIQUE: Multiplanar, multiecho pulse sequences of the brain and surrounding structures, and cervical spin

## 2022-01-06 NOTE — Progress Notes (Addendum)
Neurology Progress Note ? ?Patient ID: Brittany Huynh is a 56 y.o. with PMHx of  has a past medical history of Anemia, Anxiety, Asthma, Breast fibrocystic disorder (2013), Edema (09/15/2017), Hernia (2011), Iron deficiency anemia (09/15/2017), Lung mass (2011), Sarcoidosis, Shortness of breath, and Tachycardia, unspecified (09/15/2017). ? ?HPI (Dr. Leonie Huynh): Brittany Huynh is a 56 year old African-American lady with past medical history of anxiety, anemia, asthma, sarcoidosis who presents with sudden onset of left face arm numbness 2:50 PM today.  Patient describes the numbness has been patchy starting around eyes and then involving the left cheek and then the left forearm.  She was holding some objects which she nearly dropped.  She denies true weakness.  She had no trouble with walking, she denies accompanying headache.  She has no prior history of strokes or TIAs.  Her only vascular risk factors appear to be hypertension.  She denies any visual symptoms, speech difficulties. ?  ?Current Outpatient Medications  ?Medication Instructions  ? acetaminophen (TYLENOL) 500 mg, Oral, Every 6 hours PRN  ? albuterol (VENTOLIN HFA) 108 (90 Base) MCG/ACT inhaler 2 puffs, Inhalation, Every 6 hours PRN  ? amLODipine (NORVASC) 2.5 mg, Oral, Daily  ? aspirin 81 mg, Oral, Daily, Swallow whole.  ? augmented betamethasone dipropionate (DIPROLENE-AF) 0.05 % ointment Topical, 2 times daily, Until resolved  ? azaTHIOprine (IMURAN) 50 mg, Oral, Daily  ? bisoprolol-hydrochlorothiazide (ZIAC) 10-6.25 MG tablet TAKE 1 TABLET BY MOUTH EVERY DAY IN THE MORNING FOR BLOOD PRESSURE  ? busPIRone (BUSPAR) 10 MG tablet TAKE 1 TABLET BY MOUTH TWICE A DAY  ? celecoxib (CELEBREX) 200 mg, Oral, Daily  ? clonazePAM (KLONOPIN) 0.5 MG tablet Take 1/2 tab once a day for a week then increase to twice a day  ? dicyclomine (BENTYL) 20 mg, Oral, 3 times daily before meals & bedtime  ? DULoxetine (CYMBALTA) 60 MG capsule TAKE 1 CAPSULE BY MOUTH EVERY DAY  ?  fluticasone-salmeterol (ADVAIR) 100-50 MCG/ACT AEPB 1 puff, Inhalation, 2 times daily  ? hydroxychloroquine (PLAQUENIL) 200 mg, Oral, 2 times daily  ? metoCLOPramide (REGLAN) 5 mg, Oral, 3 times daily before meals & bedtime  ? mirtazapine (REMERON) 30 mg, Oral, Daily at bedtime  ? predniSONE (DELTASONE) 5 MG tablet TAKE 2 TABLETS BY MOUTH DAILY WITH BREAKFAST.  ? zolpidem (AMBIEN) 10 mg, Oral, At bedtime PRN  ? ? ? ?Interval events:  ?- per nursing notes c/o 2 episodes of stool incontience while sleeping over the past 3 weeks, increased urination, unable to empty bladder and increased weakness / fatigue x 6 months; denies any bowel bladder issues to me  ? ?Subjective: ?Symptoms slowly and steadily improving but not fully resolved  ? ?Exam: ?Current vital signs: ?BP (!) 147/92 (BP Location: Left Arm)   Pulse 73   Temp 98.1 ?F (36.7 ?C) (Oral)   Resp 17   Ht 5\' 6"  (1.676 m)   Wt 78 kg   SpO2 100%   BMI 27.75 kg/m?  ?Vital signs in last 24 hours: ?Temp:  [97.6 ?F (36.4 ?C)-98.2 ?F (36.8 ?C)] 98.1 ?F (36.7 ?C) (05/11 [id number]) ?Pulse Rate:  [64-93] 73 (05/11 0843) ?Resp:  [11-20] 17 (05/11 0536) ?BP: (109-179)/(76-106) 147/92 (05/11 0843) ?SpO2:  [97 %-100 %] 100 % (05/11 0843) ?Weight:  [78 kg] 78 kg (05/11 0536) ? ? ?Gen: In bed, comfortable  ?Resp: non-labored breathing, no grossly audible wheezing ?Cardiac: Perfusing extremities well  ?Abd: soft, nt ?Psych: flat affect, poor eye contact, hypophonic voice, anxious/depressed appearance ? ?Neuro: ?Mental Status: ?  Patient is awake, alert, oriented to person, place, month, year, and situation. ?Patient is able to give a clear and coherent history. ?No signs of aphasia or neglect ?Cranial Nerves: ?II: Visual Fields are full. Pupils are equal, round, and reactive to light.   ?III,IV, VI: EOMI without ptosis.  ?V: Facial sensation is symmetric to temperature ?VII: Facial movement is symmetric.  ?VIII: hearing is intact to voice ?X: Uvula difficult to visualize  ?XI:  Shoulder shrug is symmetric. ?XII: tongue is midline without atrophy or fasciculations.  ?Motor: ?Tone is normal. Bulk is normal. 5/5 strength was present in all four extremities other than hip flexion 4/5 bilaterally (denies pain) ?Sensory: ?Sensation is symmetric to light touch and temperature in the arms and legs. Mild length dependent temperature loss only in the right leg. Does still have some leg and arm tingling.  ?Deep Tendon Reflexes: ?2+ in the biceps but slightly diminished on left compared to right. 2+ and symmetric in the patellae  ?Plantars: ?Toes are mute bilaterally.  ?Cerebellar: ?FNF and HKS are intact bilaterally ? ?NIHSS total 1 ?Score breakdown: sensory change on the left arm and leg ? ? ?Pertinent data: ? ?Last metabolic panel ?Lab Results  ?Component Value Date  ? GLUCOSE 124 (H) 01/06/2022  ? NA 141 01/06/2022  ? K 3.0 (L) 01/06/2022  ? CL 107 01/06/2022  ? CO2 30 01/06/2022  ? BUN 16 01/06/2022  ? CREATININE 0.88 01/06/2022  ? GFRNONAA >60 01/06/2022  ? CALCIUM 8.7 (L) 01/06/2022  ? PROT 6.2 (L) 01/06/2022  ? ALBUMIN 3.3 (L) 01/06/2022  ? BILITOT 0.5 01/06/2022  ? ALKPHOS 74 01/06/2022  ? AST 17 01/06/2022  ? ALT 17 01/06/2022  ? ANIONGAP 4 (L) 01/06/2022  ? ?Lab Results  ?Component Value Date  ? WBC 7.7 01/06/2022  ? HGB 12.5 01/06/2022  ? HCT 39.8 01/06/2022  ? MCV 88.8 01/06/2022  ? PLT 177 01/06/2022  ? ? ?Lab Results  ?Component Value Date  ? HGBA1C 6.0 (H) 01/05/2022  ? ?Lab Results  ?Component Value Date  ? CHOL 232 (H) 01/05/2022  ? HDL 60 01/05/2022  ? LDLCALC 131 (H) 01/05/2022  ? TRIG 207 (H) 01/05/2022  ? CHOLHDL 3.9 01/05/2022  ? ?MRI brain and c-spine personally reviewed, agree with radiology:  ?1.  No acute intracranial process. ?2. C6-C7 moderate spinal canal stenosis with moderate left and mild ?right neural foraminal narrowing. ? ?CTA personally reviewed, agree with radiology: ?1.  No intracranial large vessel occlusion or significant stenosis (right PCA is diminutive but  patent) ?2.  No hemodynamically significant stenosis in the neck. ?3. No evidence of infarct core or penumbra on CT perfusion. ?4. Redemonstrated right upper lobe nodule, which appears unchanged ?compared to 2021. ? ?Lab Results  ?Component Value Date  ? [id number] 11/29/2021  ?  ? ?Impression: 56 year old woman with sarcoidosis presenting with left-sided tingling.  Stroke work-up has been reassuringly negative, though she does have an elevated cholesterol.  No acute process on C-spine imaging either.  Discussed with patient that I suspect her tingling may be combination of neuroforaminal stenosis in the cervical spine, cervical spine stenosis in combination with low normal B12 level which she was previously aware of and has been planning to start supplementation.  Additionally could obtain outpatient EMG/nerve conduction study in follow-up with Dr. Melrose Nakayama ? ?Recommendations: ?- Start atorvastatin 10 mg  ?- No need to continue ASA 81 mg  ?- B12 and MMA add-on, empiric supplementation with 1000 mcg IM here  then 1000 mcg PO daily for goal >400 ?- Follow-up with Dr. Melrose Nakayama ? ?Lesleigh Noe MD-PhD ?[facility] ?[phone]  ? ?Greater than 50 minutes were spent in direct care of this patient today, greater than 50% of bedside reviewing work-up, diagnosis and plan as above ?

## 2022-01-07 LAB — PTH, INTACT AND CALCIUM
Calcium, Total (PTH): 9.5 mg/dL (ref 8.7–10.2)
PTH: 84 pg/mL — ABNORMAL HIGH (ref 15–65)

## 2022-01-08 ENCOUNTER — Other Ambulatory Visit: Payer: Self-pay | Admitting: Internal Medicine

## 2022-01-08 DIAGNOSIS — F3341 Major depressive disorder, recurrent, in partial remission: Secondary | ICD-10-CM

## 2022-01-10 ENCOUNTER — Ambulatory Visit: Payer: BC Managed Care – PPO

## 2022-01-10 DIAGNOSIS — R0602 Shortness of breath: Secondary | ICD-10-CM | POA: Diagnosis not present

## 2022-01-10 DIAGNOSIS — R079 Chest pain, unspecified: Secondary | ICD-10-CM

## 2022-01-10 LAB — METHYLMALONIC ACID, SERUM: Methylmalonic Acid, Quantitative: 130 nmol/L (ref 0–378)

## 2022-01-11 ENCOUNTER — Encounter: Payer: Self-pay | Admitting: Nurse Practitioner

## 2022-01-11 ENCOUNTER — Ambulatory Visit: Payer: BC Managed Care – PPO | Admitting: Nurse Practitioner

## 2022-01-11 VITALS — BP 137/91 | HR 75 | Temp 98.1°F | Resp 16 | Ht 66.0 in | Wt 178.2 lb

## 2022-01-11 DIAGNOSIS — Z09 Encounter for follow-up examination after completed treatment for conditions other than malignant neoplasm: Secondary | ICD-10-CM

## 2022-01-11 DIAGNOSIS — E876 Hypokalemia: Secondary | ICD-10-CM

## 2022-01-11 DIAGNOSIS — Z8673 Personal history of transient ischemic attack (TIA), and cerebral infarction without residual deficits: Secondary | ICD-10-CM | POA: Diagnosis not present

## 2022-01-11 DIAGNOSIS — E538 Deficiency of other specified B group vitamins: Secondary | ICD-10-CM

## 2022-01-11 DIAGNOSIS — E211 Secondary hyperparathyroidism, not elsewhere classified: Secondary | ICD-10-CM | POA: Diagnosis not present

## 2022-01-11 DIAGNOSIS — G459 Transient cerebral ischemic attack, unspecified: Secondary | ICD-10-CM

## 2022-01-11 MED ORDER — CYANOCOBALAMIN 1000 MCG/ML IJ SOLN
1000.0000 ug | Freq: Once | INTRAMUSCULAR | Status: AC
  Administered 2022-01-11: 1000 ug via INTRAMUSCULAR

## 2022-01-11 NOTE — Progress Notes (Signed)
[facility] [address] [place] [address] [facility] Discharge Acute Issues Care Follow Up                                                                        Patient Demographics  Brittany Huynh, is a 56 y.o. female  DOB [birth year]  MRN [id number].  Primary MD  Jonetta Osgood, NP   Reason for TCC follow Up -left-sided numbness and paresthesia   Past Medical History:  Diagnosis Date   Anemia    Anxiety    Asthma    Breast fibrocystic disorder 2013   Edema 09/15/2017   Hernia 2011   chest wall   Iron deficiency anemia 09/15/2017   Lung mass 2011   Sarcoidosis    Shortness of breath    2011   Stroke ([place])    Tachycardia, unspecified 09/15/2017    Past Surgical History:  Procedure Laterality Date   ABDOMINAL HYSTERECTOMY  2009   BREAST BIOPSY Left 02/15/2018   PREDOMINANTLY MATURE ADIPOSE TISSUE WITH VERY FOCAL AREAS OF BENIGN    BREAST EXCISIONAL BIOPSY     chamberlain procedure   2011   COLONOSCOPY WITH PROPOFOL N/A 09/02/2020   Procedure: COLONOSCOPY WITH PROPOFOL;  Surgeon: Lin Landsman, MD;  Location: [facility] ENDOSCOPY;  Service: Gastroenterology;  Laterality: N/A;   ESOPHAGOGASTRODUODENOSCOPY (EGD) WITH PROPOFOL N/A 09/02/2020   Procedure: ESOPHAGOGASTRODUODENOSCOPY (EGD) WITH PROPOFOL;  Surgeon: Lin Landsman, MD;  Location: [facility] ENDOSCOPY;  Service: Gastroenterology;  Laterality: N/A;   IRRIGATION AND DEBRIDEMENT HEMATOMA Left 03/07/2018   Procedure: IRRIGATION AND DEBRIDEMENT HEMATOMA-LEFT BREAST;  Surgeon: Robert Bellow, MD;  Location: [facility] ORS;  Service: General;  Laterality: Left;   LUNG BIOPSY  2011   RESECTION RIBS EXTRAPLEURAL  [id number]       Recent HPI and Hospital Course  Brief/Interim Summary: 56 year old with history of essential hypertension, GAD, GERD, sarcoidosis of lung comes to the hospital with  complaints of left upper and lower extremity numbness and tingling.  By the time she came to the hospital her upper extremity symptoms had resolved.  She underwent extensive work-up including CTA head and neck, MRI brain which were both unremarkable.  MRI cervical spine showed some cervical canal stenosis without any compressive evidence.  LDL was 131, A1c was 6.0.  She was seen by neurology who recommended discontinuing aspirin, starting B12 supplementation and outpatient EMG/nerve conduction study.  She was also started on Lipitor 10 mg daily. MRI lumbar spine showed disc protrusion at multiple levels therefore recommended outpatient follow-up with neurosurgery.  Image was briefly reviewed with neurosurgery, Dr. Cari Caraway.  She would benefit from outpatient neurosurgery follow-up     Assessment and Plan: Left upper and lower extremity weakness and numbness Cervical canal stenosis Stroke work-up including CT of the head, MRI brain and CTA head and neck is negative.  MRI cervical spine shows some cervical canal stenosis therefore she has been advised to follow-up outpatient neurosurgery.  Currently she does  not have any left upper extremity numbness or weakness. -MRI of the lumbar spine shows disc protrusion at multiple levels, briefly discussed this with neurosurgery, Dr. Cari Caraway, recommend outpatient follow-up.  Their service will help arrange for follow-up.  Appreciate his input. LDL 131 therefore started on Lipitor orally daily A1c 6.0. Will need outpatient EMG/nerve conduction study   Hand weakness Resolved.    Essential hypertension Can resume home regimen   Generalized anxiety disorder Cont Cymbalta and buspar.    Gastro-esophageal reflux disease without esophagitis PPI   Sarcoidosis of lung ([place]) On daily prednisone.  Follow-up outpatient    [facility] Acute Care Issue to be followed in the Clinic   Principal Problem:   Paresthesia Active Problems:   Sarcoidosis of  lung (HCC)   Gastro-esophageal reflux disease without esophagitis   Generalized anxiety disorder   Essential hypertension   Hand weakness   Subjective:   Brittany Huynh today has, No headache, No chest pain, No abdominal pain - No Nausea, No new weakness tingling or numbness, No Cough - SOB. Denies any more paresthesia   Assessment & Plan   1. Hospital discharge follow-up Patient was seen in the hospital for possible stroke was diagnosed with paresthesia, questionable TIA.  While in the hospital her potassium level was low at 3.0 and she was kept in the hospital given fluids and medication and they recommended follow-up CBC and CMP after discharge.  Labs were ordered and requisition form provided to patient. - CBC with Differential/Platelet - CMP14+EGFR  2. Transient ischemic attack (TIA) Work-up for stroke was done in the hospital and patient did have MRIs done.  Cervical canal stenosis was noted and patient has been referred to neurosurgery for further evaluation.  She is scheduled to see Dr. Cari Caraway on May 30.  3. Secondary hyperparathyroidism, non-renal ([place]) Possible secondary hyperparathyroidism noted on labs with an elevated intact PTH level and a low calcium.  Patient did receive contrast for the MRIs which can possibly affect these lab results.  Will repeat intact PTH as well as check her phosphorus and ionized calcium levels to ensure that things have returned to normal or identified need to refer to endocrinology. - Parathyroid hormone, intact (no Ca) - Phosphorus - Calcium, ionized  4. Hypokalemia Metabolic panel ordered to recheck low potassium level as well as other levels. - CMP14+EGFR  5. B12 deficiency Borderline low B12 level, B12 injection administered in office today, may repeat in 1 month - cyanocobalamin ((VITAMIN B-12)) injection 1,000 mcg   Reason for frequent admissions/ER visits      Possible increased risk of stroke, patient was started on statin  therapy, history of hypertension and sarcoidosis of the lung.   Objective:   Vitals:   01/11/22 0942  BP: (!) 137/91  Pulse: 75  Resp: 16  Temp: 98.1 F (36.7 C)  SpO2: 98%  Weight: 178 lb 3.2 oz (80.8 kg)  Height: 5' 6"  (1.676 m)    Wt Readings from Last 3 Encounters:  01/11/22 178 lb 3.2 oz (80.8 kg)  01/06/22 171 lb 15.3 oz (78 kg)  12/30/21 177 lb 12.8 oz (80.6 kg)    Allergies as of 01/11/2022       Reactions   Onion Anaphylaxis   Adhesive [tape] Rash        Medication List        Accurate as of Jan 11, 2022  1:18 PM. If you have any questions, ask your nurse or doctor.  acetaminophen 500 MG tablet Commonly known as: TYLENOL Take 500 mg by mouth every 6 (six) hours as needed.   albuterol 108 (90 Base) MCG/ACT inhaler Commonly known as: VENTOLIN HFA Inhale 2 puffs into the lungs every 6 (six) hours as needed for wheezing or shortness of breath.   amLODipine 2.5 MG tablet Commonly known as: NORVASC Take 1 tablet (2.5 mg total) by mouth daily.   atorvastatin 10 MG tablet Commonly known as: LIPITOR Take 1 tablet (10 mg total) by mouth at bedtime.   augmented betamethasone dipropionate 0.05 % ointment Commonly known as: DIPROLENE-AF Apply topically 2 (two) times daily. Until resolved   azaTHIOprine 50 MG tablet Commonly known as: IMURAN Take 1 tablet (50 mg total) by mouth daily.   bisoprolol-hydrochlorothiazide 10-6.25 MG tablet Commonly known as: ZIAC TAKE 1 TABLET BY MOUTH EVERY DAY IN THE MORNING FOR BLOOD PRESSURE   busPIRone 10 MG tablet Commonly known as: BUSPAR TAKE 1 TABLET BY MOUTH TWICE A DAY   celecoxib 200 MG capsule Commonly known as: CELEBREX Take 1 capsule (200 mg total) by mouth daily.   clonazePAM 0.5 MG tablet Commonly known as: KLONOPIN Take 1/2 tab once a day for a week then increase to twice a day What changed:  how much to take how to take this when to take this additional instructions    cyanocobalamin 1000 MCG tablet Take 1 tablet (1,000 mcg total) by mouth daily.   dicyclomine 20 MG tablet Commonly known as: BENTYL TAKE 1 TABLET (20 MG TOTAL) BY MOUTH 4 (FOUR) TIMES DAILY - BEFORE MEALS AND AT BEDTIME.   DULoxetine 60 MG capsule Commonly known as: CYMBALTA TAKE 1 CAPSULE BY MOUTH EVERY DAY   fluticasone-salmeterol 100-50 MCG/ACT Aepb Commonly known as: ADVAIR Inhale 1 puff into the lungs 2 (two) times daily.   hydroxychloroquine 200 MG tablet Commonly known as: PLAQUENIL Take 1 tablet (200 mg total) by mouth 2 (two) times daily.   metoCLOPramide 5 MG tablet Commonly known as: REGLAN TAKE 1 TABLET (5 MG TOTAL) BY MOUTH 4 (FOUR) TIMES DAILY - BEFORE MEALS AND AT BEDTIME.   mirtazapine 30 MG tablet Commonly known as: REMERON Take 1 tablet (30 mg total) by mouth at bedtime.   predniSONE 5 MG tablet Commonly known as: DELTASONE TAKE 2 TABLETS BY MOUTH DAILY WITH BREAKFAST.   zolpidem 10 MG tablet Commonly known as: AMBIEN Take 1 tablet (10 mg total) by mouth at bedtime as needed for sleep.         Physical Exam: Constitutional: Patient appears well-developed and well-nourished. Not in obvious distress. HENT: Normocephalic, atraumatic, External right and left ear normal. Oropharynx is clear and moist.  Eyes: Conjunctivae and EOM are normal. PERRLA, no scleral icterus. Neck: Normal ROM. Neck supple. No JVD. No tracheal deviation. No thyromegaly. CVS: RRR, S1/S2 +, no murmurs, no gallops, no carotid bruit.  Pulmonary: Effort and breath sounds normal, no stridor, rhonchi, wheezes, rales.  Abdominal: Soft. BS +, no distension, tenderness, rebound or guarding.  Musculoskeletal: Normal range of motion. No edema and no tenderness.  Lymphadenopathy: No lymphadenopathy noted, cervical, inguinal or axillary Neuro: Alert. Normal reflexes, muscle tone coordination. No cranial nerve deficit. Skin: Skin is warm and dry. No rash noted. Not diaphoretic. No erythema.  No pallor. Psychiatric: Normal mood and affect. Behavior, judgment, thought content normal.   Data Review   Micro Results No results found for this or any previous visit (from the past 240 hour(s)).   CBC Recent Labs  Lab  01/05/22 1654 01/06/22 0334  WBC 7.0 7.7  HGB 13.3 12.5  HCT 42.0 39.8  PLT 193 177  MCV 89.2 88.8  MCH 28.2 27.9  MCHC 31.7 31.4  RDW 13.6 13.6  LYMPHSABS 1.4  --   MONOABS 0.4  --   EOSABS 0.0  --   BASOSABS 0.0  --     Chemistries  Recent Labs  Lab 01/05/22 1654 01/05/22 2019 01/05/22 2311 01/06/22 0334  NA 139  --   --  141  K 3.6  --   --  3.0*  CL 105  --   --  107  CO2 26  --   --  30  GLUCOSE 111*  --   --  124*  BUN 19  --   --  16  CREATININE 0.90  --   --  0.88  CALCIUM 9.2  --  9.5 8.7*  MG  --  2.4  --   --   AST 20  --   --  17  ALT 20  --   --  17  ALKPHOS 88  --   --  74  BILITOT 0.6  --   --  0.5   ------------------------------------------------------------------------------------------------------------------ estimated creatinine clearance is 77.4 mL/min (by C-G formula based on SCr of 0.88 mg/dL). ------------------------------------------------------------------------------------------------------------------ No results for input(s): HGBA1C in the last 72 hours. ------------------------------------------------------------------------------------------------------------------ No results for input(s): CHOL, HDL, LDLCALC, TRIG, CHOLHDL, LDLDIRECT in the last 72 hours. ------------------------------------------------------------------------------------------------------------------ No results for input(s): TSH, T4TOTAL, T3FREE, THYROIDAB in the last 72 hours.  Invalid input(s): FREET3 ------------------------------------------------------------------------------------------------------------------ No results for input(s): VITAMINB12, FOLATE, FERRITIN, TIBC, IRON, RETICCTPCT in the last 72 hours.  Coagulation  profile Recent Labs  Lab 01/05/22 1654  INR 1.0    No results for input(s): DDIMER in the last 72 hours.  Cardiac Enzymes No results for input(s): CKMB, TROPONINI, MYOGLOBIN in the last 168 hours.  Invalid input(s): CK ------------------------------------------------------------------------------------------------------------------ Invalid input(s): POCBNP  Return in about 1 month (around 02/11/2022) for F/U, Iley Breeden PCP.  [place] Controlled Substance Database was reviewed by me for overdose risk score (ORS)   Time Spent in minutes  45 Time spent with patient included reviewing progress notes, labs, imaging studies, and discussing plan for follow up.   This patient was seen by Jonetta Osgood, FNP-C in collaboration with Dr. Clayborn Bigness as a part of collaborative care agreement.   Jonetta Osgood MSN FNP-C on 01/11/2022 at 1:18 PM   **Disclaimer: This note may have been dictated with voice recognition software. Similar sounding words can inadvertently be transcribed and this note may contain transcription errors which may not have been corrected upon publication of note.**

## 2022-01-12 ENCOUNTER — Telehealth: Payer: Self-pay

## 2022-01-12 NOTE — Telephone Encounter (Signed)
Patient dropped off FMLA paperwork. Placed on Alyssa's desk. ?

## 2022-01-13 ENCOUNTER — Inpatient Hospital Stay: Payer: BC Managed Care – PPO | Admitting: Nurse Practitioner

## 2022-01-17 ENCOUNTER — Other Ambulatory Visit
Admission: RE | Admit: 2022-01-17 | Discharge: 2022-01-17 | Disposition: A | Discharge status: Home / Self Care | Payer: BC Managed Care – PPO | Attending: Nurse Practitioner | Admitting: Nurse Practitioner

## 2022-01-17 DIAGNOSIS — E876 Hypokalemia: Secondary | ICD-10-CM | POA: Insufficient documentation

## 2022-01-17 DIAGNOSIS — Z09 Encounter for follow-up examination after completed treatment for conditions other than malignant neoplasm: Secondary | ICD-10-CM | POA: Diagnosis not present

## 2022-01-17 DIAGNOSIS — E211 Secondary hyperparathyroidism, not elsewhere classified: Secondary | ICD-10-CM | POA: Diagnosis not present

## 2022-01-17 LAB — CBC WITH DIFFERENTIAL/PLATELET
Abs Immature Granulocytes: 0.06 10*3/uL (ref 0.00–0.07)
Basophils Absolute: 0 10*3/uL (ref 0.0–0.1)
Basophils Relative: 1 %
Eosinophils Absolute: 0.1 10*3/uL (ref 0.0–0.5)
Eosinophils Relative: 2 %
HCT: 40.9 % (ref 36.0–46.0)
Hemoglobin: 12.8 g/dL (ref 12.0–15.0)
Immature Granulocytes: 1 %
Lymphocytes Relative: 39 %
Lymphs Abs: 2.5 10*3/uL (ref 0.7–4.0)
MCH: 27.4 pg (ref 26.0–34.0)
MCHC: 31.3 g/dL (ref 30.0–36.0)
MCV: 87.6 fL (ref 80.0–100.0)
Monocytes Absolute: 0.5 10*3/uL (ref 0.1–1.0)
Monocytes Relative: 7 %
Neutro Abs: 3.3 10*3/uL (ref 1.7–7.7)
Neutrophils Relative %: 50 %
Platelets: 187 10*3/uL (ref 150–400)
RBC: 4.67 MIL/uL (ref 3.87–5.11)
RDW: 13.8 % (ref 11.5–15.5)
WBC: 6.5 10*3/uL (ref 4.0–10.5)
nRBC: 0 % (ref 0.0–0.2)

## 2022-01-17 LAB — COMPREHENSIVE METABOLIC PANEL
ALT: 26 U/L (ref 0–44)
AST: 26 U/L (ref 15–41)
Albumin: 3.6 g/dL (ref 3.5–5.0)
Alkaline Phosphatase: 88 U/L (ref 38–126)
Anion gap: 8 (ref 5–15)
BUN: 21 mg/dL — ABNORMAL HIGH (ref 6–20)
CO2: 25 mmol/L (ref 22–32)
Calcium: 8.7 mg/dL — ABNORMAL LOW (ref 8.9–10.3)
Chloride: 108 mmol/L (ref 98–111)
Creatinine, Ser: 0.92 mg/dL (ref 0.44–1.00)
GFR, Estimated: >60 mL/min (ref >60)
Glucose, Bld: 100 mg/dL — ABNORMAL HIGH (ref 70–99)
Potassium: 3.3 mmol/L — ABNORMAL LOW (ref 3.5–5.1)
Sodium: 141 mmol/L (ref 135–145)
Total Bilirubin: 0.4 mg/dL (ref 0.3–1.2)
Total Protein: 7 g/dL (ref 6.5–8.1)

## 2022-01-17 LAB — PHOSPHORUS: Phosphorus: 3.8 mg/dL (ref 2.5–4.6)

## 2022-01-18 ENCOUNTER — Telehealth: Payer: Self-pay

## 2022-01-18 LAB — PARATHYROID HORMONE, INTACT (NO CA): PTH: 79 pg/mL — ABNORMAL HIGH (ref 15–65)

## 2022-01-18 LAB — CALCIUM, IONIZED: Calcium, Ionized, Serum: 4.9 mg/dL (ref 4.5–5.6)

## 2022-01-18 NOTE — Telephone Encounter (Signed)
Disability paperwork completed by Alyssa and faxed back to patients employer at [phone]. Copy given to Desha, copy placed in [facility] office and copy placed in scan. Original held at the front desk for the patient to pickup. Patient informed paperwork is ready.

## 2022-01-18 NOTE — Progress Notes (Signed)
I have reviewed the lab results. There are no critically abnormal values requiring immediate intervention but there are some abnormals that will be discussed at the next office visit.  

## 2022-01-25 DIAGNOSIS — M5416 Radiculopathy, lumbar region: Secondary | ICD-10-CM | POA: Diagnosis not present

## 2022-01-25 DIAGNOSIS — M4802 Spinal stenosis, cervical region: Secondary | ICD-10-CM | POA: Diagnosis not present

## 2022-01-25 DIAGNOSIS — G959 Disease of spinal cord, unspecified: Secondary | ICD-10-CM | POA: Diagnosis not present

## 2022-01-31 ENCOUNTER — Ambulatory Visit
Admission: RE | Admit: 2022-01-31 | Discharge: 2022-01-31 | Disposition: A | Discharge status: Home / Self Care | Payer: BC Managed Care – PPO | Source: Ambulatory Visit | Attending: Physician Assistant | Admitting: Physician Assistant

## 2022-01-31 DIAGNOSIS — N644 Mastodynia: Secondary | ICD-10-CM | POA: Insufficient documentation

## 2022-02-02 ENCOUNTER — Telehealth: Payer: Self-pay

## 2022-02-07 DIAGNOSIS — R413 Other amnesia: Secondary | ICD-10-CM | POA: Diagnosis not present

## 2022-02-07 DIAGNOSIS — F411 Generalized anxiety disorder: Secondary | ICD-10-CM | POA: Diagnosis not present

## 2022-02-07 DIAGNOSIS — G479 Sleep disorder, unspecified: Secondary | ICD-10-CM | POA: Diagnosis not present

## 2022-02-07 DIAGNOSIS — M6289 Other specified disorders of muscle: Secondary | ICD-10-CM | POA: Diagnosis not present

## 2022-02-09 NOTE — Telephone Encounter (Signed)
Spoke to pt, she is scheduled to get an injection on Friday, and will be here tomorrow.

## 2022-02-10 ENCOUNTER — Other Ambulatory Visit: Payer: Self-pay | Admitting: Nurse Practitioner

## 2022-02-10 ENCOUNTER — Encounter: Payer: Self-pay | Admitting: Nurse Practitioner

## 2022-02-10 ENCOUNTER — Ambulatory Visit: Payer: BC Managed Care – PPO | Admitting: Nurse Practitioner

## 2022-02-10 VITALS — BP 135/85 | HR 96 | Temp 98.2°F | Resp 16 | Ht 66.0 in | Wt 180.8 lb

## 2022-02-10 DIAGNOSIS — G4700 Insomnia, unspecified: Secondary | ICD-10-CM

## 2022-02-10 DIAGNOSIS — J0110 Acute frontal sinusitis, unspecified: Secondary | ICD-10-CM | POA: Diagnosis not present

## 2022-02-10 DIAGNOSIS — M359 Systemic involvement of connective tissue, unspecified: Secondary | ICD-10-CM

## 2022-02-10 DIAGNOSIS — D860 Sarcoidosis of lung: Secondary | ICD-10-CM

## 2022-02-10 DIAGNOSIS — M2550 Pain in unspecified joint: Secondary | ICD-10-CM

## 2022-02-10 DIAGNOSIS — I10 Essential (primary) hypertension: Secondary | ICD-10-CM

## 2022-02-10 DIAGNOSIS — Z760 Encounter for issue of repeat prescription: Secondary | ICD-10-CM

## 2022-02-10 MED ORDER — PREDNISONE 5 MG PO TABS: 10.0000 mg | ORAL_TABLET | Freq: Every day | ORAL | 2 refills | Status: DC

## 2022-02-10 MED ORDER — FLUCONAZOLE 150 MG PO TABS
150.0000 mg | ORAL_TABLET | Freq: Once | ORAL | 0 refills | Status: AC
Start: 2022-02-10 — End: 2022-02-10

## 2022-02-10 MED ORDER — ZOLPIDEM TARTRATE 10 MG PO TABS: 10.0000 mg | ORAL_TABLET | Freq: Every evening | ORAL | 2 refills | Status: DC | PRN

## 2022-02-10 MED ORDER — ZOSTER VAC RECOMB ADJUVANTED 50 MCG/0.5ML IM SUSR: 0.5000 mL | Freq: Once | INTRAMUSCULAR | 0 refills | Status: AC

## 2022-02-10 MED ORDER — CELECOXIB 200 MG PO CAPS: 200.0000 mg | ORAL_CAPSULE | Freq: Every day | ORAL | 3 refills | Status: DC

## 2022-02-10 MED ORDER — BISOPROLOL-HYDROCHLOROTHIAZIDE 10-6.25 MG PO TABS: ORAL_TABLET | ORAL | 1 refills | Status: DC

## 2022-02-10 MED ORDER — AZITHROMYCIN 250 MG PO TABS: ORAL_TABLET | ORAL | 0 refills | Status: DC

## 2022-02-10 MED ORDER — CLONAZEPAM 0.5 MG PO TABS: 0.5000 mg | ORAL_TABLET | Freq: Every day | ORAL | 2 refills | Status: DC

## 2022-02-10 MED ORDER — AMLODIPINE BESYLATE 2.5 MG PO TABS: 2.5000 mg | ORAL_TABLET | Freq: Every day | ORAL | 1 refills | Status: DC

## 2022-02-10 MED ORDER — MIRTAZAPINE 30 MG PO TABS: 30.0000 mg | ORAL_TABLET | Freq: Every day | ORAL | 1 refills | Status: DC

## 2022-02-10 MED ORDER — BUSPIRONE HCL 10 MG PO TABS: 10.0000 mg | ORAL_TABLET | Freq: Two times a day (BID) | ORAL | 1 refills | Status: DC

## 2022-02-10 NOTE — Progress Notes (Signed)
[facility] [address] [place], [place] [address]  Internal MEDICINE  Office Visit Note  Patient Name: Brittany Huynh  [id number]  [id number]  Date of Service: 02/10/2022  Chief Complaint  Patient presents with   Follow-up   Anxiety   Anemia   Asthma    HPI [name] presents for a follow-up visit for anxiety, joint pains and lower extremity numbness.  She was previously hospitalized for a brief period of time for a TIA and was referred to neurology and neurosurgery for the TIA as well as the cervical spinal stenosis that was seen on imaging while she was in the hospital.  Also, due to the presentation of her symptoms when having the TIA as well as the numbness in her legs and her legs having increased muscle weakness and giving out from under her, Dr. Malvin Johns is also evaluating her for multiple sclerosis.  She has an upcoming EMG on her lower extremities.  She is a little nervous for the upcoming test but otherwise relieved to have further evaluation of her symptoms.     Current Medication: Outpatient Encounter Medications as of 02/10/2022  Medication Sig Note   acetaminophen (TYLENOL) 500 MG tablet Take 500 mg by mouth every 6 (six) hours as needed. 09/29/2020: Can take 1 or 2 at a time   albuterol (VENTOLIN HFA) 108 (90 Base) MCG/ACT inhaler Inhale 2 puffs into the lungs every 6 (six) hours as needed for wheezing or shortness of breath.    atorvastatin (LIPITOR) 10 MG tablet Take 1 tablet (10 mg total) by mouth at bedtime.    augmented betamethasone dipropionate (DIPROLENE-AF) 0.05 % ointment Apply topically 2 (two) times daily. Until resolved    azithromycin (ZITHROMAX) 250 MG tablet Take one tab a day for 10 days for uri    dicyclomine (BENTYL) 20 MG tablet TAKE 1 TABLET (20 MG TOTAL) BY MOUTH 4 (FOUR) TIMES DAILY - BEFORE MEALS AND AT BEDTIME.    fluconazole (DIFLUCAN) 150 MG tablet Take 1 tablet (150 mg total) by mouth once for 1 dose. May take an additional dose after  3 days if still symptomatic.    fluticasone-salmeterol (ADVAIR) 100-50 MCG/ACT AEPB Inhale 1 puff into the lungs 2 (two) times daily.    hydroxychloroquine (PLAQUENIL) 200 MG tablet TAKE 1 TABLET BY MOUTH TWICE A DAY    metoCLOPramide (REGLAN) 5 MG tablet TAKE 1 TABLET (5 MG TOTAL) BY MOUTH 4 (FOUR) TIMES DAILY - BEFORE MEALS AND AT BEDTIME.    nortriptyline (PAMELOR) 10 MG capsule takenortriptyline 10 mg at night for one week then increase to 20 mg at night for one week. Then, increase to 30 mg nightly and continue.    vitamin B-12 1000 MCG tablet Take 1 tablet (1,000 mcg total) by mouth daily.    [DISCONTINUED] amLODipine (NORVASC) 2.5 MG tablet Take 1 tablet (2.5 mg total) by mouth daily.    [DISCONTINUED] azaTHIOprine (IMURAN) 50 MG tablet Take 1 tablet (50 mg total) by mouth daily.    [DISCONTINUED] bisoprolol-hydrochlorothiazide (ZIAC) 10-6.25 MG tablet TAKE 1 TABLET BY MOUTH EVERY DAY IN THE MORNING FOR BLOOD PRESSURE    [DISCONTINUED] busPIRone (BUSPAR) 10 MG tablet TAKE 1 TABLET BY MOUTH TWICE A DAY (Patient taking differently: Take 10 mg by mouth 2 (two) times daily.)    [DISCONTINUED] celecoxib (CELEBREX) 200 MG capsule Take 1 capsule (200 mg total) by mouth daily.    [DISCONTINUED] clonazePAM (KLONOPIN) 0.5 MG tablet Take 1/2 tab once a day for a  week then increase to twice a day (Patient taking differently: Take 0.5 mg by mouth daily.)    [DISCONTINUED] mirtazapine (REMERON) 30 MG tablet Take 1 tablet (30 mg total) by mouth at bedtime.    [DISCONTINUED] predniSONE (DELTASONE) 5 MG tablet TAKE 2 TABLETS BY MOUTH DAILY WITH BREAKFAST.    [DISCONTINUED] zolpidem (AMBIEN) 10 MG tablet Take 1 tablet (10 mg total) by mouth at bedtime as needed for sleep.    [DISCONTINUED] Zoster Vaccine Adjuvanted [facility]) injection Inject 0.5 mLs into the muscle once.    amLODipine (NORVASC) 2.5 MG tablet Take 1 tablet (2.5 mg total) by mouth daily.    bisoprolol-hydrochlorothiazide (ZIAC) 10-6.25 MG  tablet TAKE 1 TABLET BY MOUTH EVERY DAY IN THE MORNING FOR BLOOD PRESSURE    busPIRone (BUSPAR) 10 MG tablet Take 1 tablet (10 mg total) by mouth 2 (two) times daily.    celecoxib (CELEBREX) 200 MG capsule Take 1 capsule (200 mg total) by mouth daily.    clonazePAM (KLONOPIN) 0.5 MG tablet Take 1 tablet (0.5 mg total) by mouth daily.    mirtazapine (REMERON) 30 MG tablet Take 1 tablet (30 mg total) by mouth at bedtime.    predniSONE (DELTASONE) 5 MG tablet Take 2 tablets (10 mg total) by mouth daily with breakfast.    zolpidem (AMBIEN) 10 MG tablet Take 1 tablet (10 mg total) by mouth at bedtime as needed for sleep.    Zoster Vaccine Adjuvanted [facility]) injection Inject 0.5 mLs into the muscle once for 1 dose.    [DISCONTINUED] DULoxetine (CYMBALTA) 60 MG capsule TAKE 1 CAPSULE BY MOUTH EVERY DAY (Patient not taking: Reported on 02/10/2022)    No facility-administered encounter medications on file as of 02/10/2022.    Surgical History: Past Surgical History:  Procedure Laterality Date   ABDOMINAL HYSTERECTOMY  2009   BREAST BIOPSY Left 02/15/2018   PREDOMINANTLY MATURE ADIPOSE TISSUE WITH VERY FOCAL AREAS OF BENIGN    BREAST EXCISIONAL BIOPSY     chamberlain procedure   2011   COLONOSCOPY WITH PROPOFOL N/A 09/02/2020   Procedure: COLONOSCOPY WITH PROPOFOL;  Surgeon: Toney Reil, MD;  Location[facility] ENDOSCOPY;  Service: Gastroenterology;  Laterality: N/A;   ESOPHAGOGASTRODUODENOSCOPY (EGD) WITH PROPOFOL N/A 09/02/2020   Procedure: ESOPHAGOGASTRODUODENOSCOPY (EGD) WITH PROPOFOL;  Surgeon: Toney Reil, MD;  Location: [facility] ENDOSCOPY;  Service: Gastroenterology;  Laterality: N/A;   IRRIGATION AND DEBRIDEMENT HEMATOMA Left 03/07/2018   Procedure: IRRIGATION AND DEBRIDEMENT HEMATOMA-LEFT BREAST;  Surgeon: Earline Mayotte, MD;  Location: [facility] ORS;  Service: General;  Laterality: Left;   LUNG BIOPSY  2011   RESECTION RIBS EXTRAPLEURAL  [address]    Medical History: Past Medical  History:  Diagnosis Date   Anemia    Anxiety    Asthma    Breast fibrocystic disorder 2013   Edema 09/15/2017   Hernia 2011   chest wall   Iron deficiency anemia 09/15/2017   Lung mass 2011   Sarcoidosis    Shortness of breath    2011   Stroke (HCC)    Tachycardia, unspecified 09/15/2017    Family History: Family History  Problem Relation Age of Onset   Liver cancer Mother 80   Bone cancer Maternal Aunt 60   Breast cancer Maternal Aunt    Breast cancer Paternal Aunt    Kidney cancer Father    Bone cancer Paternal Aunt 7    Social History   Socioeconomic History   Marital status: Divorced    Spouse name: Not  on file   Number of children: Not on file   Years of education: Not on file   Highest education level: Not on file  Occupational History   Not on file  Tobacco Use   Smoking status: Never   Smokeless tobacco: Never  Vaping Use   Vaping Use: Never used  Substance and Sexual Activity   Alcohol use: Yes    Comment: on occassion   Drug use: No   Sexual activity: Not on file  Other Topics Concern   Not on file  Social History Narrative   Not on file   Social Determinants of Health   Financial Resource Strain: Not on file  Food Insecurity: Not on file  Transportation Needs: Not on file  Physical Activity: Not on file  Stress: Not on file  Social Connections: Not on file  Intimate Partner Violence: Not on file      Review of Systems  Constitutional:  Positive for activity change, appetite change and fatigue. Negative for chills and fever.  HENT:  Positive for congestion, postnasal drip, rhinorrhea, sinus pressure, sinus pain, sneezing and sore throat. Negative for ear pain.   Respiratory:  Positive for cough. Negative for chest tightness, shortness of breath and wheezing.   Cardiovascular: Negative.  Negative for chest pain and palpitations.  Gastrointestinal:  Positive for abdominal distention, abdominal pain and nausea.  Musculoskeletal:   Positive for arthralgias, back pain and myalgias.  Skin: Negative.   Neurological:  Positive for weakness (esp lower extremities), numbness (lower extremities) and headaches.  Hematological:  Bruises/bleeds easily.  Psychiatric/Behavioral:  Positive for sleep disturbance. Negative for self-injury and suicidal ideas. The patient is nervous/anxious.     Vital Signs: BP (!) 156/97   Pulse 96   Temp 98.2 F (36.8 C)   Resp 16   Ht  (1.676 m)   Wt 180 lb 12.8 oz (82 kg)   SpO2 97%   BMI 29.18 kg/m    Physical Exam Vitals reviewed.  Constitutional:      General: She is awake. She is not in acute distress.    Appearance: Normal appearance. She is well-developed, well-groomed and overweight. She is ill-appearing.  HENT:     Head: Normocephalic and atraumatic.     Right Ear: Tympanic membrane, ear canal and external ear normal.     Left Ear: Tympanic membrane, ear canal and external ear normal.     Nose: Mucosal edema, congestion and rhinorrhea present. Rhinorrhea is clear.     Right Turbinates: Swollen and pale.     Left Turbinates: Swollen and pale.     Right Sinus: Frontal sinus tenderness present. No maxillary sinus tenderness.     Left Sinus: Frontal sinus tenderness present. No maxillary sinus tenderness.     Mouth/Throat:     Lips: Pink.     Mouth: Mucous membranes are moist.     Pharynx: Posterior oropharyngeal erythema present.  Eyes:     General: Lids are normal. Vision grossly intact. Gaze aligned appropriately.     Pupils: Pupils are equal, round, and reactive to light.     Funduscopic exam:    Right eye: Red reflex present.        Left eye: Red reflex present. Neck:     Trachea: Trachea and phonation normal.  Cardiovascular:     Rate and Rhythm: Normal rate and regular rhythm.     Heart sounds: Normal heart sounds, S1 normal and S2 normal. No murmur heard. Pulmonary:  Effort: Pulmonary effort is normal. No accessory muscle usage or respiratory distress.      Breath sounds: Normal breath sounds and air entry.  Abdominal:     General: Bowel sounds are normal. There is distension.     Tenderness: There is generalized abdominal tenderness.  Musculoskeletal:     Right lower leg: No edema.     Left lower leg: No edema.  Neurological:     Mental Status: She is alert and oriented to person, place, and time.  Psychiatric:        Mood and Affect: Mood normal.        Behavior: Behavior normal. Behavior is cooperative.        Assessment/Plan: 1. Acute non-recurrent frontal sinusitis Empiric antibiotic treatment prescribed - azithromycin (ZITHROMAX) 250 MG tablet; Take one tab a day for 10 days for uri  Dispense: 10 tablet; Refill: 0  2. Essential hypertension, benign Blood pressure is stable with current medications, refills ordered. - amLODipine (NORVASC) 2.5 MG tablet; Take 1 tablet (2.5 mg total) by mouth daily.  Dispense: 90 tablet; Refill: 1 - bisoprolol-hydrochlorothiazide (ZIAC) 10-6.25 MG tablet; TAKE 1 TABLET BY MOUTH EVERY DAY IN THE MORNING FOR BLOOD PRESSURE  Dispense: 90 tablet; Refill: 1  3. Sarcoidosis of lung (HCC) Continue daily prednisone, refills ordered - predniSONE (DELTASONE) 5 MG tablet; Take 2 tablets (10 mg total) by mouth daily with breakfast.  Dispense: 60 tablet; Refill: 2  4. Insomnia, unspecified type Ambien refills ordered, mirtazapine refills ordered.  Patient reports better sleep with Ambien.  Follow-up in 3 months for refills - zolpidem (AMBIEN) 10 MG tablet; Take 1 tablet (10 mg total) by mouth at bedtime as needed for sleep.  Dispense: 30 tablet; Refill: 2 - mirtazapine (REMERON) 30 MG tablet; Take 1 tablet (30 mg total) by mouth at bedtime.  Dispense: 90 tablet; Refill: 1  5. Encounter for medication refill - Zoster Vaccine Adjuvanted [facility]) injection; Inject 0.5 mLs into the muscle once for 1 dose.  Dispense: 0.5 mL; Refill: 0 - zolpidem (AMBIEN) 10 MG tablet; Take 1 tablet (10 mg total) by mouth  at bedtime as needed for sleep.  Dispense: 30 tablet; Refill: 2 - predniSONE (DELTASONE) 5 MG tablet; Take 2 tablets (10 mg total) by mouth daily with breakfast.  Dispense: 60 tablet; Refill: 2 - celecoxib (CELEBREX) 200 MG capsule; Take 1 capsule (200 mg total) by mouth daily.  Dispense: 90 capsule; Refill: 3 - mirtazapine (REMERON) 30 MG tablet; Take 1 tablet (30 mg total) by mouth at bedtime.  Dispense: 90 tablet; Refill: 1 - bisoprolol-hydrochlorothiazide (ZIAC) 10-6.25 MG tablet; TAKE 1 TABLET BY MOUTH EVERY DAY IN THE MORNING FOR BLOOD PRESSURE  Dispense: 90 tablet; Refill: 1 - busPIRone (BUSPAR) 10 MG tablet; Take 1 tablet (10 mg total) by mouth 2 (two) times daily.  Dispense: 180 tablet; Refill: 1 - clonazePAM (KLONOPIN) 0.5 MG tablet; Take 1 tablet (0.5 mg total) by mouth daily.  Dispense: 30 tablet; Refill: 2 - fluconazole (DIFLUCAN) 150 MG tablet; Take 1 tablet (150 mg total) by mouth once for 1 dose. May take an additional dose after 3 days if still symptomatic.  Dispense: 3 tablet; Refill: 0   General Counseling: Airen verbalizes understanding of the findings of todays visit and agrees with plan of treatment. I have discussed any further diagnostic evaluation that may be needed or ordered today. We also reviewed her medications today. she has been encouraged to call the office with any questions or concerns that should  arise related to todays visit.    No orders of the defined types were placed in this encounter.   Meds ordered this encounter  Medications   Zoster Vaccine Adjuvanted [facility]) injection    Sig: Inject 0.5 mLs into the muscle once for 1 dose.    Dispense:  0.5 mL    Refill:  0   amLODipine (NORVASC) 2.5 MG tablet    Sig: Take 1 tablet (2.5 mg total) by mouth daily.    Dispense:  90 tablet    Refill:  1   zolpidem (AMBIEN) 10 MG tablet    Sig: Take 1 tablet (10 mg total) by mouth at bedtime as needed for sleep.    Dispense:  30 tablet    Refill:  2    predniSONE (DELTASONE) 5 MG tablet    Sig: Take 2 tablets (10 mg total) by mouth daily with breakfast.    Dispense:  60 tablet    Refill:  2   celecoxib (CELEBREX) 200 MG capsule    Sig: Take 1 capsule (200 mg total) by mouth daily.    Dispense:  90 capsule    Refill:  3   mirtazapine (REMERON) 30 MG tablet    Sig: Take 1 tablet (30 mg total) by mouth at bedtime.    Dispense:  90 tablet    Refill:  1   bisoprolol-hydrochlorothiazide (ZIAC) 10-6.25 MG tablet    Sig: TAKE 1 TABLET BY MOUTH EVERY DAY IN THE MORNING FOR BLOOD PRESSURE    Dispense:  90 tablet    Refill:  1   busPIRone (BUSPAR) 10 MG tablet    Sig: Take 1 tablet (10 mg total) by mouth 2 (two) times daily.    Dispense:  180 tablet    Refill:  1   clonazePAM (KLONOPIN) 0.5 MG tablet    Sig: Take 1 tablet (0.5 mg total) by mouth daily.    Dispense:  30 tablet    Refill:  2   azithromycin (ZITHROMAX) 250 MG tablet    Sig: Take one tab a day for 10 days for uri    Dispense:  10 tablet    Refill:  0   fluconazole (DIFLUCAN) 150 MG tablet    Sig: Take 1 tablet (150 mg total) by mouth once for 1 dose. May take an additional dose after 3 days if still symptomatic.    Dispense:  3 tablet    Refill:  0    Return in about 2 months (around 04/12/2022) for F/U, Sherrina Zaugg PCP.   Total time spent:30 Minutes Time spent includes review of chart, medications, test results, and follow up plan with the patient.   [place] Controlled Substance Database was reviewed by me.  This patient was seen by [name], FNP-C in collaboration with Dr. Beverely Risen as a part of collaborative care agreement.   Diarra Ceja R. Tedd Sias, MSN, FNP-C Internal medicine

## 2022-02-11 DIAGNOSIS — M5416 Radiculopathy, lumbar region: Secondary | ICD-10-CM | POA: Diagnosis not present

## 2022-02-11 DIAGNOSIS — M5126 Other intervertebral disc displacement, lumbar region: Secondary | ICD-10-CM | POA: Diagnosis not present

## 2022-02-28 ENCOUNTER — Ambulatory Visit: Payer: BC Managed Care – PPO | Admitting: Nurse Practitioner

## 2022-02-28 ENCOUNTER — Encounter: Payer: Self-pay | Admitting: Nurse Practitioner

## 2022-02-28 VITALS — BP 118/78 | HR 80 | Temp 98.4°F | Resp 16 | Ht 66.0 in | Wt 180.0 lb

## 2022-02-28 DIAGNOSIS — Z23 Encounter for immunization: Secondary | ICD-10-CM

## 2022-02-28 DIAGNOSIS — M2550 Pain in unspecified joint: Secondary | ICD-10-CM | POA: Diagnosis not present

## 2022-02-28 DIAGNOSIS — I10 Essential (primary) hypertension: Secondary | ICD-10-CM | POA: Diagnosis not present

## 2022-02-28 DIAGNOSIS — R262 Difficulty in walking, not elsewhere classified: Secondary | ICD-10-CM | POA: Diagnosis not present

## 2022-02-28 DIAGNOSIS — M6289 Other specified disorders of muscle: Secondary | ICD-10-CM

## 2022-02-28 MED ORDER — ZOSTER VAC RECOMB ADJUVANTED 50 MCG/0.5ML IM SUSR: 0.5000 mL | Freq: Once | INTRAMUSCULAR | 0 refills | Status: AC

## 2022-02-28 NOTE — Progress Notes (Signed)
[facility] [address] [place], [place] [address]  Internal MEDICINE  Office Visit Note  Patient Name: Brittany Huynh  [id number]  [id number]  Date of Service: 02/28/2022  Chief Complaint  Patient presents with   Follow-up   Anemia   Anxiety   Asthma    HPI Brittany Huynh presents for a follow-up visit for recent upper respiratory infection and asthma.  She recently finished a Z-Pak for symptoms of a sinus infection/URI.  She reports that she feels better but she still feels very tired.  She does still have some drainage in her throat and she can continue taking Mucinex. Her neurologist is evaluating her for multiple sclerosis and she has a nerve conduction test on her lower extremities coming up soon.  Her neurologist started her on nortriptyline and has her slowly titrating the dose up to 30 mg total    Current Medication: Outpatient Encounter Medications as of 02/28/2022  Medication Sig Note   acetaminophen (TYLENOL) 500 MG tablet Take 500 mg by mouth every 6 (six) hours as needed. 09/29/2020: Can take 1 or 2 at a time   albuterol (VENTOLIN HFA) 108 (90 Base) MCG/ACT inhaler Inhale 2 puffs into the lungs every 6 (six) hours as needed for wheezing or shortness of breath.    amLODipine (NORVASC) 2.5 MG tablet Take 1 tablet (2.5 mg total) by mouth daily.    atorvastatin (LIPITOR) 10 MG tablet Take 1 tablet (10 mg total) by mouth at bedtime.    augmented betamethasone dipropionate (DIPROLENE-AF) 0.05 % ointment Apply topically 2 (two) times daily. Until resolved    azithromycin (ZITHROMAX) 250 MG tablet Take one tab a day for 10 days for uri    bisoprolol-hydrochlorothiazide (ZIAC) 10-6.25 MG tablet TAKE 1 TABLET BY MOUTH EVERY DAY IN THE MORNING FOR BLOOD PRESSURE    busPIRone (BUSPAR) 10 MG tablet Take 1 tablet (10 mg total) by mouth 2 (two) times daily.    celecoxib (CELEBREX) 200 MG capsule Take 1 capsule (200 mg total) by mouth daily.    clonazePAM (KLONOPIN) 0.5 MG tablet  Take 1 tablet (0.5 mg total) by mouth daily.    dicyclomine (BENTYL) 20 MG tablet TAKE 1 TABLET (20 MG TOTAL) BY MOUTH 4 (FOUR) TIMES DAILY - BEFORE MEALS AND AT BEDTIME.    fluticasone-salmeterol (ADVAIR) 100-50 MCG/ACT AEPB Inhale 1 puff into the lungs 2 (two) times daily.    hydroxychloroquine (PLAQUENIL) 200 MG tablet TAKE 1 TABLET BY MOUTH TWICE A DAY    metoCLOPramide (REGLAN) 5 MG tablet TAKE 1 TABLET (5 MG TOTAL) BY MOUTH 4 (FOUR) TIMES DAILY - BEFORE MEALS AND AT BEDTIME.    mirtazapine (REMERON) 30 MG tablet Take 1 tablet (30 mg total) by mouth at bedtime.    nortriptyline (PAMELOR) 10 MG capsule takenortriptyline 10 mg at night for one week then increase to 20 mg at night for one week. Then, increase to 30 mg nightly and continue.    predniSONE (DELTASONE) 5 MG tablet Take 2 tablets (10 mg total) by mouth daily with breakfast.    vitamin B-12 1000 MCG tablet Take 1 tablet (1,000 mcg total) by mouth daily.    zolpidem (AMBIEN) 10 MG tablet Take 1 tablet (10 mg total) by mouth at bedtime as needed for sleep.    [DISCONTINUED] Zoster Vaccine Adjuvanted [facility]) injection Inject 0.5 mLs into the muscle once.    Zoster Vaccine Adjuvanted [facility]) injection Inject 0.5 mLs into the muscle once for 1 dose.  No facility-administered encounter medications on file as of 02/28/2022.    Surgical History: Past Surgical History:  Procedure Laterality Date   ABDOMINAL HYSTERECTOMY  2009   BREAST BIOPSY Left 02/15/2018   PREDOMINANTLY MATURE ADIPOSE TISSUE WITH VERY FOCAL AREAS OF BENIGN    BREAST EXCISIONAL BIOPSY     chamberlain procedure   2011   COLONOSCOPY WITH PROPOFOL N/A 09/02/2020   Procedure: COLONOSCOPY WITH PROPOFOL;  Surgeon: Toney Reil, MD;  Location: [facility] ENDOSCOPY;  Service: Gastroenterology;  Laterality: N/A;   ESOPHAGOGASTRODUODENOSCOPY (EGD) WITH PROPOFOL N/A 09/02/2020   Procedure: ESOPHAGOGASTRODUODENOSCOPY (EGD) WITH PROPOFOL;  Surgeon: Toney Reil, MD;   Location: [facility] ENDOSCOPY;  Service: Gastroenterology;  Laterality: N/A;   IRRIGATION AND DEBRIDEMENT HEMATOMA Left 03/07/2018   Procedure: IRRIGATION AND DEBRIDEMENT HEMATOMA-LEFT BREAST;  Surgeon: Earline Mayotte, MD;  Location: [facility] ORS;  Service: General;  Laterality: Left;   LUNG BIOPSY  2011   RESECTION RIBS EXTRAPLEURAL  [address]    Medical History: Past Medical History:  Diagnosis Date   Anemia    Anxiety    Asthma    Breast fibrocystic disorder 2013   Edema 09/15/2017   Hernia 2011   chest wall   Iron deficiency anemia 09/15/2017   Lung mass 2011   Sarcoidosis    Shortness of breath    2011   Stroke (HCC)    Tachycardia, unspecified 09/15/2017    Family History: Family History  Problem Relation Age of Onset   Liver cancer Mother 72   Bone cancer Maternal Aunt 60   Breast cancer Maternal Aunt    Breast cancer Paternal Aunt    Kidney cancer Father    Bone cancer Paternal Aunt 92    Social History   Socioeconomic History   Marital status: Divorced    Spouse name: Not on file   Number of children: Not on file   Years of education: Not on file   Highest education level: Not on file  Occupational History   Not on file  Tobacco Use   Smoking status: Never   Smokeless tobacco: Never  Vaping Use   Vaping Use: Never used  Substance and Sexual Activity   Alcohol use: Yes    Comment: on occassion   Drug use: No   Sexual activity: Not on file  Other Topics Concern   Not on file  Social History Narrative   Not on file   Social Determinants of Health   Financial Resource Strain: Not on file  Food Insecurity: Not on file  Transportation Needs: Not on file  Physical Activity: Not on file  Stress: Not on file  Social Connections: Not on file  Intimate Partner Violence: Not on file      Review of Systems  HENT:  Positive for postnasal drip, rhinorrhea and sneezing. Negative for congestion, sinus pressure, sinus pain and sore throat.   Respiratory:   Negative for cough, chest tightness, shortness of breath and wheezing.   Cardiovascular: Negative.  Negative for chest pain and palpitations.  Gastrointestinal:  Positive for abdominal pain.  Musculoskeletal:  Positive for gait problem.  Neurological:  Positive for weakness and numbness.    Vital Signs: BP 118/78   Pulse 80   Temp 98.4 F (36.9 C)   Resp 16   Ht 5\' 6"  (1.676 m)   Wt 180 lb (81.6 kg)   SpO2 97%   BMI 29.05 kg/m    Physical Exam Vitals reviewed.  Constitutional:  General: She is not in acute distress.    Appearance: Normal appearance. She is ill-appearing.  HENT:     Head: Normocephalic and atraumatic.  Eyes:     Pupils: Pupils are equal, round, and reactive to light.  Cardiovascular:     Rate and Rhythm: Normal rate and regular rhythm.  Pulmonary:     Effort: Pulmonary effort is normal. No respiratory distress.  Neurological:     Mental Status: She is alert and oriented to person, place, and time.     Motor: Weakness present.  Psychiatric:        Mood and Affect: Mood normal.        Behavior: Behavior normal.        Assessment/Plan: 1. Essential hypertension, benign Stable and controlled with current medication  2. Diffuse arthralgia Has medication for pain prescribed and is seeing neurology and neurosurgery for cervical stenosis.   3. Muscle stiffness Neurology is evaluating patient for MS, has upcoming EMG testing on lower extremities  4. Difficulty walking See problem #3.   5. Need for vaccination - Zoster Vaccine Adjuvanted [facility]) injection; Inject 0.5 mLs into the muscle once for 1 dose.  Dispense: 0.5 mL; Refill: 0    General Counseling: Jakylah verbalizes understanding of the findings of todays visit and agrees with plan of treatment. I have discussed any further diagnostic evaluation that may be needed or ordered today. We also reviewed her medications today. she has been encouraged to call the office with any questions or  concerns that should arise related to todays visit.    No orders of the defined types were placed in this encounter.   Meds ordered this encounter  Medications   Zoster Vaccine Adjuvanted [facility]) injection    Sig: Inject 0.5 mLs into the muscle once for 1 dose.    Dispense:  0.5 mL    Refill:  0    Return in about 2 months (around 05/01/2022) for F/U, med refill, Lynzi Meulemans PCP.   Total time spent:30 Minutes Time spent includes review of chart, medications, test results, and follow up plan with the patient.   [place] Controlled Substance Database was reviewed by me.  This patient was seen by [name], FNP-C in collaboration with Dr. Beverely Risen as a part of collaborative care agreement.   Surah Pelley R. Tedd Sias, MSN, FNP-C Internal medicine

## 2022-03-02 DIAGNOSIS — M25561 Pain in right knee: Secondary | ICD-10-CM | POA: Diagnosis not present

## 2022-03-02 DIAGNOSIS — G8929 Other chronic pain: Secondary | ICD-10-CM | POA: Diagnosis not present

## 2022-03-02 DIAGNOSIS — M25562 Pain in left knee: Secondary | ICD-10-CM | POA: Diagnosis not present

## 2022-04-03 ENCOUNTER — Encounter: Payer: Self-pay | Admitting: Nurse Practitioner

## 2022-04-18 ENCOUNTER — Ambulatory Visit: Payer: BC Managed Care – PPO | Admitting: Nurse Practitioner

## 2022-04-18 DIAGNOSIS — G479 Sleep disorder, unspecified: Secondary | ICD-10-CM | POA: Diagnosis not present

## 2022-04-18 DIAGNOSIS — R413 Other amnesia: Secondary | ICD-10-CM | POA: Diagnosis not present

## 2022-04-18 DIAGNOSIS — F411 Generalized anxiety disorder: Secondary | ICD-10-CM | POA: Diagnosis not present

## 2022-04-25 ENCOUNTER — Encounter: Payer: Self-pay | Admitting: Nurse Practitioner

## 2022-04-25 ENCOUNTER — Ambulatory Visit: Payer: BC Managed Care – PPO | Admitting: Nurse Practitioner

## 2022-04-25 VITALS — BP 123/74 | HR 80 | Temp 98.4°F | Resp 16 | Ht 66.0 in | Wt 180.6 lb

## 2022-04-25 DIAGNOSIS — G4700 Insomnia, unspecified: Secondary | ICD-10-CM | POA: Diagnosis not present

## 2022-04-25 DIAGNOSIS — M2550 Pain in unspecified joint: Secondary | ICD-10-CM

## 2022-04-25 DIAGNOSIS — Z760 Encounter for issue of repeat prescription: Secondary | ICD-10-CM | POA: Diagnosis not present

## 2022-04-25 MED ORDER — ZALEPLON 5 MG PO CAPS: 5.0000 mg | ORAL_CAPSULE | Freq: Every evening | ORAL | 2 refills | Status: DC | PRN

## 2022-04-25 MED ORDER — CLONAZEPAM 0.5 MG PO TABS: 0.5000 mg | ORAL_TABLET | Freq: Every day | ORAL | 2 refills | Status: DC

## 2022-04-25 NOTE — Progress Notes (Signed)
[facility] [address] [place], [place] [address]  Internal MEDICINE  Office Visit Note  Patient Name: Brittany Huynh  [id number]  [id number]  Date of Service: 04/25/2022  Chief Complaint  Patient presents with   Follow-up   Medication Refill   Quality Metric Gaps    Shingles Vaccine    HPI Brittany Huynh presents for a follow up visit for trouble sleeping, chronic pain, fatigue, and medication review  --forgetful, issues with memory --was prescribed seroquel for sleep but makes her too sleepy during the day, has already tried melatonin, mirtazapine, ambien, clonazepam, gabapentin, and trazodone.  --on nortriptyline for neuropathy  Options -- restoril, belsomra, Kelli Churn, sonata, quviviq  Current Medication: Outpatient Encounter Medications as of 04/25/2022  Medication Sig Note   acetaminophen (TYLENOL) 500 MG tablet Take 500 mg by mouth every 6 (six) hours as needed. 09/29/2020: Can take 1 or 2 at a time   albuterol (VENTOLIN HFA) 108 (90 Base) MCG/ACT inhaler Inhale 2 puffs into the lungs every 6 (six) hours as needed for wheezing or shortness of breath.    amLODipine (NORVASC) 2.5 MG tablet Take 1 tablet (2.5 mg total) by mouth daily.    atorvastatin (LIPITOR) 10 MG tablet Take 1 tablet (10 mg total) by mouth at bedtime.    bisoprolol-hydrochlorothiazide (ZIAC) 10-6.25 MG tablet TAKE 1 TABLET BY MOUTH EVERY DAY IN THE MORNING FOR BLOOD PRESSURE    celecoxib (CELEBREX) 200 MG capsule Take 1 capsule (200 mg total) by mouth daily.    dicyclomine (BENTYL) 20 MG tablet TAKE 1 TABLET (20 MG TOTAL) BY MOUTH 4 (FOUR) TIMES DAILY - BEFORE MEALS AND AT BEDTIME.    fluticasone-salmeterol (ADVAIR) 100-50 MCG/ACT AEPB Inhale 1 puff into the lungs 2 (two) times daily.    hydroxychloroquine (PLAQUENIL) 200 MG tablet TAKE 1 TABLET BY MOUTH TWICE A DAY    metoCLOPramide (REGLAN) 5 MG tablet TAKE 1 TABLET (5 MG TOTAL) BY MOUTH 4 (FOUR) TIMES DAILY - BEFORE MEALS AND AT BEDTIME.     mirtazapine (REMERON) 30 MG tablet Take 1 tablet (30 mg total) by mouth at bedtime.    nortriptyline (PAMELOR) 10 MG capsule takenortriptyline 10 mg at night for one week then increase to 20 mg at night for one week. Then, increase to 30 mg nightly and continue.    predniSONE (DELTASONE) 5 MG tablet Take 2 tablets (10 mg total) by mouth daily with breakfast.    vitamin B-12 1000 MCG tablet Take 1 tablet (1,000 mcg total) by mouth daily.    zaleplon (SONATA) 5 MG capsule Take 1 capsule (5 mg total) by mouth at bedtime as needed for sleep.    [DISCONTINUED] augmented betamethasone dipropionate (DIPROLENE-AF) 0.05 % ointment Apply topically 2 (two) times daily. Until resolved    [DISCONTINUED] azithromycin (ZITHROMAX) 250 MG tablet Take one tab a day for 10 days for uri    [DISCONTINUED] busPIRone (BUSPAR) 10 MG tablet Take 1 tablet (10 mg total) by mouth 2 (two) times daily.    [DISCONTINUED] clonazePAM (KLONOPIN) 0.5 MG tablet Take 1 tablet (0.5 mg total) by mouth daily.    [DISCONTINUED] zolpidem (AMBIEN) 10 MG tablet Take 1 tablet (10 mg total) by mouth at bedtime as needed for sleep.    clonazePAM (KLONOPIN) 0.5 MG tablet Take 1 tablet (0.5 mg total) by mouth daily.    No facility-administered encounter medications on file as of 04/25/2022.    Surgical History: Past Surgical History:  Procedure Laterality Date   ABDOMINAL  HYSTERECTOMY  2009   BREAST BIOPSY Left 02/15/2018   PREDOMINANTLY MATURE ADIPOSE TISSUE WITH VERY FOCAL AREAS OF BENIGN    BREAST EXCISIONAL BIOPSY     chamberlain procedure   2011   COLONOSCOPY WITH PROPOFOL N/A 09/02/2020   Procedure: COLONOSCOPY WITH PROPOFOL;  Surgeon: Toney Reil, MD;  Location: [facility] ENDOSCOPY;  Service: Gastroenterology;  Laterality: N/A;   ESOPHAGOGASTRODUODENOSCOPY (EGD) WITH PROPOFOL N/A 09/02/2020   Procedure: ESOPHAGOGASTRODUODENOSCOPY (EGD) WITH PROPOFOL;  Surgeon: Toney Reil, MD;  Location: [facility] ENDOSCOPY;  Service:  Gastroenterology;  Laterality: N/A;   IRRIGATION AND DEBRIDEMENT HEMATOMA Left 03/07/2018   Procedure: IRRIGATION AND DEBRIDEMENT HEMATOMA-LEFT BREAST;  Surgeon: Earline Mayotte, MD;  Location: [facility] ORS;  Service: General;  Laterality: Left;   LUNG BIOPSY  2011   RESECTION RIBS EXTRAPLEURAL  [id number]    Medical History: Past Medical History:  Diagnosis Date   Anemia    Anxiety    Asthma    Breast fibrocystic disorder 2013   Edema 09/15/2017   Hernia 2011   chest wall   Iron deficiency anemia 09/15/2017   Lung mass 2011   Sarcoidosis    Shortness of breath    2011   Stroke (HCC)    Tachycardia, unspecified 09/15/2017    Family History: Family History  Problem Relation Age of Onset   Liver cancer Mother 54   Bone cancer Maternal Aunt 60   Breast cancer Maternal Aunt    Breast cancer Paternal Aunt    Kidney cancer Father    Bone cancer Paternal Aunt 74    Social History   Socioeconomic History   Marital status: Divorced    Spouse name: Not on file   Number of children: Not on file   Years of education: Not on file   Highest education level: Not on file  Occupational History   Not on file  Tobacco Use   Smoking status: Never   Smokeless tobacco: Never  Vaping Use   Vaping Use: Never used  Substance and Sexual Activity   Alcohol use: Yes    Comment: on occassion   Drug use: No   Sexual activity: Not on file  Other Topics Concern   Not on file  Social History Narrative   Not on file   Social Determinants of Health   Financial Resource Strain: Not on file  Food Insecurity: Not on file  Transportation Needs: Not on file  Physical Activity: Not on file  Stress: Not on file  Social Connections: Not on file  Intimate Partner Violence: Not on file      Review of Systems  HENT:  Positive for postnasal drip, rhinorrhea and sneezing. Negative for congestion, sinus pressure, sinus pain and sore throat.   Respiratory:  Negative for cough, chest tightness,  shortness of breath and wheezing.   Cardiovascular: Negative.  Negative for chest pain and palpitations.  Gastrointestinal:  Positive for abdominal pain.  Musculoskeletal:  Positive for gait problem.  Neurological:  Positive for weakness and numbness.    Vital Signs: BP 123/74   Pulse 80   Temp 98.4 F (36.9 C)   Resp 16   Ht 5\' 6"  (1.676 m)   Wt 180 lb 9.6 oz (81.9 kg)   SpO2 97%   BMI 29.15 kg/m    Physical Exam Vitals reviewed.  Constitutional:      General: She is not in acute distress.    Appearance: Normal appearance. She is ill-appearing.  HENT:  Head: Normocephalic and atraumatic.  Eyes:     Pupils: Pupils are equal, round, and reactive to light.  Cardiovascular:     Rate and Rhythm: Normal rate and regular rhythm.  Pulmonary:     Effort: Pulmonary effort is normal. No respiratory distress.  Neurological:     Mental Status: She is alert and oriented to person, place, and time.     Motor: Weakness present.  Psychiatric:        Mood and Affect: Mood normal.        Behavior: Behavior normal.        Assessment/Plan: 1. Diffuse arthralgia Doing a little better nortriptyline   2. Insomnia, unspecified type Seroquel discontinued, will try sonata.  - zaleplon (SONATA) 5 MG capsule; Take 1 capsule (5 mg total) by mouth at bedtime as needed for sleep.  Dispense: 30 capsule; Refill: 2  3. Encounter for medication refill - clonazePAM (KLONOPIN) 0.5 MG tablet; Take 1 tablet (0.5 mg total) by mouth daily.  Dispense: 30 tablet; Refill: 2   General Counseling: Daryl verbalizes understanding of the findings of todays visit and agrees with plan of treatment. I have discussed any further diagnostic evaluation that may be needed or ordered today. We also reviewed her medications today. she has been encouraged to call the office with any questions or concerns that should arise related to todays visit.    No orders of the defined types were placed in this  encounter.   Meds ordered this encounter  Medications   clonazePAM (KLONOPIN) 0.5 MG tablet    Sig: Take 1 tablet (0.5 mg total) by mouth daily.    Dispense:  30 tablet    Refill:  2   zaleplon (SONATA) 5 MG capsule    Sig: Take 1 capsule (5 mg total) by mouth at bedtime as needed for sleep.    Dispense:  30 capsule    Refill:  2    Discontinue zolpidem and quetiapine now, please fill new script for zaleplon today.    Return in about 2 weeks (around 05/09/2022) for F/U, eval new med, [place] PCP in 2-3 weeks office visit.   Total time spent:30 Minutes Time spent includes review of chart, medications, test results, and follow up plan with the patient.   [place] Controlled Substance Database was reviewed by me.  This patient was seen by [name], FNP-C in collaboration with Dr. Beverely Risen as a part of collaborative care agreement.   Mohmed Farver R. Tedd Sias, MSN, FNP-C Internal medicine

## 2022-04-30 ENCOUNTER — Other Ambulatory Visit: Payer: Self-pay | Admitting: Nurse Practitioner

## 2022-04-30 DIAGNOSIS — R140 Abdominal distension (gaseous): Secondary | ICD-10-CM

## 2022-05-11 ENCOUNTER — Other Ambulatory Visit: Payer: Self-pay | Admitting: Nurse Practitioner

## 2022-05-23 ENCOUNTER — Ambulatory Visit: Payer: BC Managed Care – PPO | Admitting: Nurse Practitioner

## 2022-05-23 ENCOUNTER — Encounter: Payer: Self-pay | Admitting: Nurse Practitioner

## 2022-05-23 VITALS — BP 106/73 | HR 82 | Temp 95.9°F | Resp 16 | Ht 66.0 in | Wt 185.2 lb

## 2022-05-23 DIAGNOSIS — J0110 Acute frontal sinusitis, unspecified: Secondary | ICD-10-CM | POA: Diagnosis not present

## 2022-05-23 DIAGNOSIS — I10 Essential (primary) hypertension: Secondary | ICD-10-CM | POA: Diagnosis not present

## 2022-05-23 DIAGNOSIS — G4709 Other insomnia: Secondary | ICD-10-CM

## 2022-05-23 DIAGNOSIS — K117 Disturbances of salivary secretion: Secondary | ICD-10-CM | POA: Diagnosis not present

## 2022-05-23 DIAGNOSIS — Z23 Encounter for immunization: Secondary | ICD-10-CM

## 2022-05-23 MED ORDER — AZITHROMYCIN 250 MG PO TABS: ORAL_TABLET | ORAL | 0 refills | Status: AC

## 2022-05-23 MED ORDER — TETANUS-DIPHTH-ACELL PERTUSSIS 5-2.5-18.5 LF-MCG/0.5 IM SUSP: 0.5000 mL | Freq: Once | INTRAMUSCULAR | 0 refills | Status: AC

## 2022-05-23 MED ORDER — ZOSTER VAC RECOMB ADJUVANTED 50 MCG/0.5ML IM SUSR: 0.5000 mL | Freq: Once | INTRAMUSCULAR | 0 refills | Status: AC

## 2022-05-23 NOTE — Progress Notes (Signed)
[facility] [address] [place], [place] [address]  Internal MEDICINE  Office Visit Note  Patient Name: Brittany Huynh  [id number]  [id number]  Date of Service: 05/23/2022  Chief Complaint  Patient presents with   Follow-up    Follow up new med    HPI Brittany Huynh presents for a follow up visit for insomnia, hypertension, fatigue and symptoms of sinusitis.  Insomnia -- started Sonata at last visit, reports she is sleeping a little better and wants to continue the medication.  Hypertension -- well controlled Fatigue -- multifactorial -- poor sleep, med side effect, possible symptom of chronic conditions.  Sinusitis -- reports postnasal drip, sinus pressure, nasal congestion, headache, cough, chest tightness, slight wheezing,, slight SOB Dry mouth -- side effect of multiple medications that she is taking. To alleviate, she will chew gum or suck on hard candy, or drink water.      Current Medication: Outpatient Encounter Medications as of 05/23/2022  Medication Sig Note   acetaminophen (TYLENOL) 500 MG tablet Take 500 mg by mouth every 6 (six) hours as needed. 09/29/2020: Can take 1 or 2 at a time   albuterol (VENTOLIN HFA) 108 (90 Base) MCG/ACT inhaler Inhale 2 puffs into the lungs every 6 (six) hours as needed for wheezing or shortness of breath.    amLODipine (NORVASC) 2.5 MG tablet Take 1 tablet (2.5 mg total) by mouth daily.    atorvastatin (LIPITOR) 10 MG tablet Take 1 tablet (10 mg total) by mouth at bedtime.    azithromycin (ZITHROMAX) 250 MG tablet Take 2 tablets on day 1, then 1 tablet daily on days 2 through 5    bisoprolol-hydrochlorothiazide (ZIAC) 10-6.25 MG tablet TAKE 1 TABLET BY MOUTH EVERY DAY IN THE MORNING FOR BLOOD PRESSURE    celecoxib (CELEBREX) 200 MG capsule Take 1 capsule (200 mg total) by mouth daily.    clonazePAM (KLONOPIN) 0.5 MG tablet Take 1 tablet (0.5 mg total) by mouth daily.    dicyclomine (BENTYL) 20 MG tablet TAKE 1 TABLET (20 MG TOTAL) BY  MOUTH 4 (FOUR) TIMES DAILY - BEFORE MEALS AND AT BEDTIME.    fluticasone-salmeterol (ADVAIR) 100-50 MCG/ACT AEPB Inhale 1 puff into the lungs 2 (two) times daily.    hydroxychloroquine (PLAQUENIL) 200 MG tablet TAKE 1 TABLET BY MOUTH TWICE A DAY    metoCLOPramide (REGLAN) 5 MG tablet Take 1 tablet (5 mg total) by mouth 4 (four) times daily -  before meals and at bedtime. As needed only    mirtazapine (REMERON) 30 MG tablet Take 1 tablet (30 mg total) by mouth at bedtime.    nortriptyline (PAMELOR) 10 MG capsule takenortriptyline 10 mg at night for one week then increase to 20 mg at night for one week. Then, increase to 30 mg nightly and continue.    predniSONE (DELTASONE) 5 MG tablet Take 2 tablets (10 mg total) by mouth daily with breakfast.    vitamin B-12 1000 MCG tablet Take 1 tablet (1,000 mcg total) by mouth daily.    zaleplon (SONATA) 5 MG capsule Take 1 capsule (5 mg total) by mouth at bedtime as needed for sleep.    [DISCONTINUED] Tdap (BOOSTRIX) 5-2.5-18.5 LF-MCG/0.5 injection Inject 0.5 mLs into the muscle once.    [DISCONTINUED] Zoster Vaccine Adjuvanted [facility]) injection Inject 0.5 mLs into the muscle once.    Tdap (BOOSTRIX) 5-2.5-18.5 LF-MCG/0.5 injection Inject 0.5 mLs into the muscle once for 1 dose.    Zoster Vaccine Adjuvanted [facility]) injection Inject 0.5 mLs into  the muscle once for 1 dose.    No facility-administered encounter medications on file as of 05/23/2022.    Surgical History: Past Surgical History:  Procedure Laterality Date   ABDOMINAL HYSTERECTOMY  2009   BREAST BIOPSY Left 02/15/2018   PREDOMINANTLY MATURE ADIPOSE TISSUE WITH VERY FOCAL AREAS OF BENIGN    BREAST EXCISIONAL BIOPSY     chamberlain procedure   2011   COLONOSCOPY WITH PROPOFOL N/A 09/02/2020   Procedure: COLONOSCOPY WITH PROPOFOL;  Surgeon: Lin Landsman, MD;  Location: [facility] ENDOSCOPY;  Service: Gastroenterology;  Laterality: N/A;   ESOPHAGOGASTRODUODENOSCOPY (EGD) WITH PROPOFOL N/A  09/02/2020   Procedure: ESOPHAGOGASTRODUODENOSCOPY (EGD) WITH PROPOFOL;  Surgeon: Lin Landsman, MD;  Location[facility] ENDOSCOPY;  Service: Gastroenterology;  Laterality: N/A;   IRRIGATION AND DEBRIDEMENT HEMATOMA Left 03/07/2018   Procedure: IRRIGATION AND DEBRIDEMENT HEMATOMA-LEFT BREAST;  Surgeon: Robert Bellow, MD;  Location: [facility] ORS;  Service: General;  Laterality: Left;   LUNG BIOPSY  2011   RESECTION RIBS EXTRAPLEURAL  [id number]    Medical History: Past Medical History:  Diagnosis Date   Anemia    Anxiety    Asthma    Breast fibrocystic disorder 2013   Edema 09/15/2017   Hernia 2011   chest wall   Iron deficiency anemia 09/15/2017   Lung mass 2011   Sarcoidosis    Shortness of breath    2011   Stroke ([place])    Tachycardia, unspecified 09/15/2017    Family History: Family History  Problem Relation Age of Onset   Liver cancer Mother 34   Bone cancer Maternal Aunt 60   Breast cancer Maternal Aunt    Breast cancer Paternal Aunt    Kidney cancer Father    Bone cancer Paternal Aunt 22    Social History   Socioeconomic History   Marital status: Divorced    Spouse name: Not on file   Number of children: Not on file   Years of education: Not on file   Highest education level: Not on file  Occupational History   Not on file  Tobacco Use   Smoking status: Never   Smokeless tobacco: Never  Vaping Use   Vaping Use: Never used  Substance and Sexual Activity   Alcohol use: Yes    Comment: on occassion   Drug use: No   Sexual activity: Not on file  Other Topics Concern   Not on file  Social History Narrative   Not on file   Social Determinants of Health   Financial Resource Strain: Not on file  Food Insecurity: Not on file  Transportation Needs: Not on file  Physical Activity: Not on file  Stress: Not on file  Social Connections: Not on file  Intimate Partner Violence: Not on file      Review of Systems  Constitutional:  Positive for fatigue.  Negative for activity change, appetite change, chills and fever.  HENT:  Positive for congestion, postnasal drip, sinus pressure and sinus pain. Negative for ear pain, rhinorrhea, sneezing and sore throat.   Respiratory:  Positive for cough, chest tightness and wheezing. Negative for shortness of breath.   Cardiovascular: Negative.  Negative for chest pain and palpitations.  Gastrointestinal:  Negative for abdominal distention, abdominal pain and nausea.  Musculoskeletal:  Positive for arthralgias, back pain and myalgias.  Skin: Negative.   Neurological:  Positive for weakness (esp lower extremities), numbness (lower extremities) and headaches.  Hematological:  Does not bruise/bleed easily.  Psychiatric/Behavioral:  Positive for sleep  disturbance. Negative for self-injury and suicidal ideas. The patient is nervous/anxious.     Vital Signs: BP 106/73   Pulse 82   Temp (!) 95.9 F (35.5 C)   Resp 16   Ht 5\' 6"  (1.676 m)   Wt 185 lb 3.2 oz (84 kg)   SpO2 96%   BMI 29.89 kg/m    Physical Exam Constitutional:      Appearance: Normal appearance.  HENT:     Head: Normocephalic and atraumatic.     Right Ear: Tympanic membrane, ear canal and external ear normal.     Left Ear: Tympanic membrane, ear canal and external ear normal.     Nose: Congestion present.     Mouth/Throat:     Mouth: Mucous membranes are moist.     Pharynx: Posterior oropharyngeal erythema present. No oropharyngeal exudate.  Eyes:     Pupils: Pupils are equal, round, and reactive to light.  Cardiovascular:     Rate and Rhythm: Normal rate and regular rhythm.     Heart sounds: Normal heart sounds. No murmur heard. Pulmonary:     Effort: Pulmonary effort is normal. No respiratory distress.     Breath sounds: Wheezing present.  Neurological:     Mental Status: She is alert and oriented to person, place, and time.  Psychiatric:        Mood and Affect: Mood normal.        Behavior: Behavior normal.         Assessment/Plan: 1. Acute non-recurrent frontal sinusitis Likely sinusitis developing, zpack prescribed, call clinic if symptoms do not improve or worsen.  - azithromycin (ZITHROMAX) 250 MG tablet; Take 2 tablets on day 1, then 1 tablet daily on days 2 through 5  Dispense: 6 tablet; Refill: 0  2. Essential hypertension, benign BP stable with current medication. Continue as prescribed.   3. Other insomnia Sonata is helping her sleep, would like to continue. Continue taking sonata as prescribed, 2 refills are already at her pharmacy, no refill due at this time.   4. Xerostomia Discussed possible contributing factors. Discussed interventions such as biotene mouth wash or lozenges for dry mouth, drinking more water, sucking on a piece of hard candy, chewing gum, and good dental hygiene. Patient acknowledged she will try the interventions.   5. Need for vaccination - Tdap (BOOSTRIX) 5-2.5-18.5 LF-MCG/0.5 injection; Inject 0.5 mLs into the muscle once for 1 dose.  Dispense: 0.5 mL; Refill: 0 - Zoster Vaccine Adjuvanted [facility]) injection; Inject 0.5 mLs into the muscle once for 1 dose.  Dispense: 0.5 mL; Refill: 0   General Counseling: Concha verbalizes understanding of the findings of todays visit and agrees with plan of treatment. I have discussed any further diagnostic evaluation that may be needed or ordered today. We also reviewed her medications today. she has been encouraged to call the office with any questions or concerns that should arise related to todays visit.    No orders of the defined types were placed in this encounter.   Meds ordered this encounter  Medications   Tdap (BOOSTRIX) 5-2.5-18.5 LF-MCG/0.5 injection    Sig: Inject 0.5 mLs into the muscle once for 1 dose.    Dispense:  0.5 mL    Refill:  0   Zoster Vaccine Adjuvanted [facility]) injection    Sig: Inject 0.5 mLs into the muscle once for 1 dose.    Dispense:  0.5 mL    Refill:  0   azithromycin  (ZITHROMAX) 250 MG tablet  Sig: Take 2 tablets on day 1, then 1 tablet daily on days 2 through 5    Dispense:  6 tablet    Refill:  0    Return in about 2 months (around 07/23/2022) for F/U, med refill, [place]ael Shuman PCP.   Total time spent:30 Minutes Time spent includes review of chart, medications, test results, and follow up plan with the patient.   [place] Controlled Substance Database was reviewed by me.  This patient was seen by [name], FNP-C in collaboration with Dr. Beverely Risen as a part of collaborative care agreement.   Deauna Yaw R. Tedd Sias, MSN, FNP-C Internal medicine

## 2022-05-30 ENCOUNTER — Other Ambulatory Visit: Payer: Self-pay | Admitting: Nurse Practitioner

## 2022-05-30 DIAGNOSIS — R413 Other amnesia: Secondary | ICD-10-CM | POA: Diagnosis not present

## 2022-05-30 DIAGNOSIS — F411 Generalized anxiety disorder: Secondary | ICD-10-CM | POA: Diagnosis not present

## 2022-05-30 DIAGNOSIS — R200 Anesthesia of skin: Secondary | ICD-10-CM | POA: Diagnosis not present

## 2022-05-30 DIAGNOSIS — G479 Sleep disorder, unspecified: Secondary | ICD-10-CM | POA: Diagnosis not present

## 2022-05-30 DIAGNOSIS — Z760 Encounter for issue of repeat prescription: Secondary | ICD-10-CM

## 2022-05-30 DIAGNOSIS — G4700 Insomnia, unspecified: Secondary | ICD-10-CM

## 2022-06-03 DIAGNOSIS — Z23 Encounter for immunization: Secondary | ICD-10-CM | POA: Diagnosis not present

## 2022-06-04 ENCOUNTER — Other Ambulatory Visit: Payer: Self-pay | Admitting: Nurse Practitioner

## 2022-06-04 DIAGNOSIS — Z760 Encounter for issue of repeat prescription: Secondary | ICD-10-CM

## 2022-06-04 DIAGNOSIS — D860 Sarcoidosis of lung: Secondary | ICD-10-CM

## 2022-06-25 ENCOUNTER — Encounter: Payer: Self-pay | Admitting: Nurse Practitioner

## 2022-07-25 ENCOUNTER — Encounter: Payer: Self-pay | Admitting: Nurse Practitioner

## 2022-07-25 ENCOUNTER — Ambulatory Visit: Payer: BC Managed Care – PPO | Admitting: Nurse Practitioner

## 2022-07-25 VITALS — BP 125/82 | HR 96 | Temp 96.7°F | Resp 16 | Ht 66.0 in | Wt 177.2 lb

## 2022-07-25 DIAGNOSIS — M359 Systemic involvement of connective tissue, unspecified: Secondary | ICD-10-CM

## 2022-07-25 DIAGNOSIS — G4700 Insomnia, unspecified: Secondary | ICD-10-CM | POA: Diagnosis not present

## 2022-07-25 DIAGNOSIS — Z760 Encounter for issue of repeat prescription: Secondary | ICD-10-CM

## 2022-07-25 DIAGNOSIS — D860 Sarcoidosis of lung: Secondary | ICD-10-CM

## 2022-07-25 DIAGNOSIS — R140 Abdominal distension (gaseous): Secondary | ICD-10-CM

## 2022-07-25 DIAGNOSIS — I10 Essential (primary) hypertension: Secondary | ICD-10-CM

## 2022-07-25 MED ORDER — HYDROXYCHLOROQUINE SULFATE 200 MG PO TABS: 200.0000 mg | ORAL_TABLET | Freq: Two times a day (BID) | ORAL | 2 refills | Status: DC

## 2022-07-25 MED ORDER — METOCLOPRAMIDE HCL 5 MG PO TABS: 5.0000 mg | ORAL_TABLET | Freq: Three times a day (TID) | ORAL | 3 refills | Status: DC

## 2022-07-25 MED ORDER — BISOPROLOL-HYDROCHLOROTHIAZIDE 10-6.25 MG PO TABS: ORAL_TABLET | ORAL | 2 refills | Status: DC

## 2022-07-25 MED ORDER — CLONAZEPAM 0.5 MG PO TABS: 0.5000 mg | ORAL_TABLET | Freq: Every day | ORAL | 0 refills | Status: DC

## 2022-07-25 MED ORDER — AMLODIPINE BESYLATE 2.5 MG PO TABS: 2.5000 mg | ORAL_TABLET | Freq: Every day | ORAL | 2 refills | Status: DC

## 2022-07-25 MED ORDER — NORTRIPTYLINE HCL 50 MG PO CAPS: 50.0000 mg | ORAL_CAPSULE | Freq: Every day | ORAL | 1 refills | Status: DC

## 2022-07-25 MED ORDER — MIRTAZAPINE 30 MG PO TABS: 30.0000 mg | ORAL_TABLET | Freq: Every day | ORAL | 5 refills | Status: DC

## 2022-07-25 MED ORDER — ZALEPLON 5 MG PO CAPS: 5.0000 mg | ORAL_CAPSULE | Freq: Every evening | ORAL | 2 refills | Status: DC | PRN

## 2022-07-25 MED ORDER — PREDNISONE 10 MG PO TABS: 10.0000 mg | ORAL_TABLET | Freq: Every day | ORAL | 2 refills | Status: DC

## 2022-07-25 NOTE — Progress Notes (Signed)
[facility] [address] [place], [place] [address]  Internal MEDICINE  Office Visit Note  Patient Name: Brittany Huynh  [id number]  [id number]  Date of Service: 07/25/2022  Chief Complaint  Patient presents with   Follow-up    Med refills    HPI Calianna presents for a follow up visit for medication refills.  Insomnia -- has been taking sonata Recent sinus infection -- improved/resolved.  Dry mouth -- using nonpharmacologic interventions Hypertension -- controlled with current medications  Celebrex on hold B12 on hold  Current Medication: Outpatient Encounter Medications as of 07/25/2022  Medication Sig Note   acetaminophen (TYLENOL) 500 MG tablet Take 500 mg by mouth every 6 (six) hours as needed. 09/29/2020: Can take 1 or 2 at a time   albuterol (VENTOLIN HFA) 108 (90 Base) MCG/ACT inhaler Inhale 2 puffs into the lungs every 6 (six) hours as needed for wheezing or shortness of breath.    celecoxib (CELEBREX) 200 MG capsule Take 1 capsule (200 mg total) by mouth daily.    dicyclomine (BENTYL) 20 MG tablet TAKE 1 TABLET (20 MG TOTAL) BY MOUTH 4 (FOUR) TIMES DAILY - BEFORE MEALS AND AT BEDTIME.    nortriptyline (PAMELOR) 50 MG capsule Take 1 capsule (50 mg total) by mouth at bedtime.    predniSONE (DELTASONE) 10 MG tablet Take 1 tablet (10 mg total) by mouth daily with breakfast.    predniSONE (DELTASONE) 5 MG tablet TAKE 2 TABLETS BY MOUTH DAILY WITH BREAKFAST.    [DISCONTINUED] amLODipine (NORVASC) 2.5 MG tablet Take 1 tablet (2.5 mg total) by mouth daily.    [DISCONTINUED] atorvastatin (LIPITOR) 10 MG tablet Take 1 tablet (10 mg total) by mouth at bedtime.    [DISCONTINUED] bisoprolol-hydrochlorothiazide (ZIAC) 10-6.25 MG tablet TAKE 1 TABLET BY MOUTH EVERY DAY IN THE MORNING FOR BLOOD PRESSURE    [DISCONTINUED] clonazePAM (KLONOPIN) 0.5 MG tablet Take 1 tablet (0.5 mg total) by mouth daily.    [DISCONTINUED] fluticasone-salmeterol (ADVAIR) 100-50 MCG/ACT AEPB  Inhale 1 puff into the lungs 2 (two) times daily.    [DISCONTINUED] hydroxychloroquine (PLAQUENIL) 200 MG tablet TAKE 1 TABLET BY MOUTH TWICE A DAY    [DISCONTINUED] metoCLOPramide (REGLAN) 5 MG tablet Take 1 tablet (5 mg total) by mouth 4 (four) times daily -  before meals and at bedtime. As needed only    [DISCONTINUED] mirtazapine (REMERON) 30 MG tablet TAKE 1 TABLET BY MOUTH AT BEDTIME.    [DISCONTINUED] nortriptyline (PAMELOR) 10 MG capsule takenortriptyline 10 mg at night for one week then increase to 20 mg at night for one week. Then, increase to 30 mg nightly and continue.    [DISCONTINUED] vitamin B-12 1000 MCG tablet Take 1 tablet (1,000 mcg total) by mouth daily.    [DISCONTINUED] zaleplon (SONATA) 5 MG capsule Take 1 capsule (5 mg total) by mouth at bedtime as needed for sleep.    amLODipine (NORVASC) 2.5 MG tablet Take 1 tablet (2.5 mg total) by mouth daily.    bisoprolol-hydrochlorothiazide (ZIAC) 10-6.25 MG tablet TAKE 1 TABLET BY MOUTH EVERY DAY IN THE MORNING FOR BLOOD PRESSURE    clonazePAM (KLONOPIN) 0.5 MG tablet Take 1 tablet (0.5 mg total) by mouth daily.    hydroxychloroquine (PLAQUENIL) 200 MG tablet Take 1 tablet (200 mg total) by mouth 2 (two) times daily.    metoCLOPramide (REGLAN) 5 MG tablet Take 1 tablet (5 mg total) by mouth 4 (four) times daily -  before meals and at bedtime. As needed only  mirtazapine (REMERON) 30 MG tablet Take 1 tablet (30 mg total) by mouth at bedtime.    zaleplon (SONATA) 5 MG capsule Take 1 capsule (5 mg total) by mouth at bedtime as needed for sleep.    No facility-administered encounter medications on file as of 07/25/2022.    Surgical History: Past Surgical History:  Procedure Laterality Date   ABDOMINAL HYSTERECTOMY  2009   BREAST BIOPSY Left 02/15/2018   PREDOMINANTLY MATURE ADIPOSE TISSUE WITH VERY FOCAL AREAS OF BENIGN    BREAST EXCISIONAL BIOPSY     chamberlain procedure   2011   COLONOSCOPY WITH PROPOFOL N/A 09/02/2020    Procedure: COLONOSCOPY WITH PROPOFOL;  Surgeon: Toney Reil, MD;  Location: [facility] ENDOSCOPY;  Service: Gastroenterology;  Laterality: N/A;   ESOPHAGOGASTRODUODENOSCOPY (EGD) WITH PROPOFOL N/A 09/02/2020   Procedure: ESOPHAGOGASTRODUODENOSCOPY (EGD) WITH PROPOFOL;  Surgeon: Toney Reil, MD;  Location: [facility] ENDOSCOPY;  Service: Gastroenterology;  Laterality: N/A;   IRRIGATION AND DEBRIDEMENT HEMATOMA Left 03/07/2018   Procedure: IRRIGATION AND DEBRIDEMENT HEMATOMA-LEFT BREAST;  Surgeon: Earline Mayotte, MD;  Location: [facility] ORS;  Service: General;  Laterality: Left;   LUNG BIOPSY  2011   RESECTION RIBS EXTRAPLEURAL  [id number]    Medical History: Past Medical History:  Diagnosis Date   Anemia    Anxiety    Asthma    Breast fibrocystic disorder 2013   Edema 09/15/2017   Hernia 2011   chest wall   Iron deficiency anemia 09/15/2017   Lung mass 2011   Sarcoidosis    Shortness of breath    2011   Stroke (HCC)    Tachycardia, unspecified 09/15/2017    Family History: Family History  Problem Relation Age of Onset   Liver cancer Mother 76   Bone cancer Maternal Aunt 60   Breast cancer Maternal Aunt    Breast cancer Paternal Aunt    Kidney cancer Father    Bone cancer Paternal Aunt 43    Social History   Socioeconomic History   Marital status: Divorced    Spouse name: Not on file   Number of children: Not on file   Years of education: Not on file   Highest education level: Not on file  Occupational History   Not on file  Tobacco Use   Smoking status: Never   Smokeless tobacco: Never  Vaping Use   Vaping Use: Never used  Substance and Sexual Activity   Alcohol use: Not Currently    Comment: on occassion   Drug use: No   Sexual activity: Not on file  Other Topics Concern   Not on file  Social History Narrative   Not on file   Social Determinants of Health   Financial Resource Strain: Not on file  Food Insecurity: Not on file  Transportation Needs: Not  on file  Physical Activity: Not on file  Stress: Not on file  Social Connections: Not on file  Intimate Partner Violence: Not on file      Review of Systems  HENT:  Positive for postnasal drip, rhinorrhea and sneezing. Negative for congestion, sinus pressure, sinus pain and sore throat.   Respiratory:  Negative for cough, chest tightness, shortness of breath and wheezing.   Cardiovascular: Negative.  Negative for chest pain and palpitations.  Gastrointestinal:  Positive for abdominal pain.  Musculoskeletal:  Positive for gait problem.  Neurological:  Positive for weakness and numbness.    Vital Signs: BP 125/82   Pulse 96   Temp (!) 96.7 F (  35.9 C)   Resp 16   Ht 5\' 6"  (1.676 m)   Wt 177 lb 3.2 oz (80.4 kg)   SpO2 97%   BMI 28.60 kg/m    Physical Exam Vitals reviewed.  Constitutional:      General: She is not in acute distress.    Appearance: Normal appearance. She is ill-appearing.  HENT:     Head: Normocephalic and atraumatic.  Eyes:     Pupils: Pupils are equal, round, and reactive to light.  Cardiovascular:     Rate and Rhythm: Normal rate and regular rhythm.  Pulmonary:     Effort: Pulmonary effort is normal. No respiratory distress.  Neurological:     Mental Status: She is alert and oriented to person, place, and time.     Motor: Weakness present.  Psychiatric:        Mood and Affect: Mood normal.        Behavior: Behavior normal.        Assessment/Plan: 1. Insomnia, unspecified type Continue sonata as prescribed.  - zaleplon (SONATA) 5 MG capsule; Take 1 capsule (5 mg total) by mouth at bedtime as needed for sleep.  Dispense: 30 capsule; Refill: 2  2. Connective tissue disease, undifferentiated (HCC) Continue hydroxychloroquine as prescribed  3. Medication refill - zaleplon (SONATA) 5 MG capsule; Take 1 capsule (5 mg total) by mouth at bedtime as needed for sleep.  Dispense: 30 capsule; Refill: 2 - hydroxychloroquine (PLAQUENIL) 200 MG tablet;  Take 1 tablet (200 mg total) by mouth 2 (two) times daily.  Dispense: 60 tablet; Refill: 2 - clonazePAM (KLONOPIN) 0.5 MG tablet; Take 1 tablet (0.5 mg total) by mouth daily.  Dispense: 90 tablet; Refill: 0 - bisoprolol-hydrochlorothiazide (ZIAC) 10-6.25 MG tablet; TAKE 1 TABLET BY MOUTH EVERY DAY IN THE MORNING FOR BLOOD PRESSURE  Dispense: 30 tablet; Refill: 2 - amLODipine (NORVASC) 2.5 MG tablet; Take 1 tablet (2.5 mg total) by mouth daily.  Dispense: 30 tablet; Refill: 2 - nortriptyline (PAMELOR) 50 MG capsule; Take 1 capsule (50 mg total) by mouth at bedtime.  Dispense: 90 capsule; Refill: 1 - predniSONE (DELTASONE) 10 MG tablet; Take 1 tablet (10 mg total) by mouth daily with breakfast.  Dispense: 30 tablet; Refill: 2 - mirtazapine (REMERON) 30 MG tablet; Take 1 tablet (30 mg total) by mouth at bedtime.  Dispense: 30 tablet; Refill: 5 - metoCLOPramide (REGLAN) 5 MG tablet; Take 1 tablet (5 mg total) by mouth 4 (four) times daily -  before meals and at bedtime. As needed only  Dispense: 120 tablet; Refill: 3   General Counseling: Lorrain verbalizes understanding of the findings of todays visit and agrees with plan of treatment. I have discussed any further diagnostic evaluation that may be needed or ordered today. We also reviewed her medications today. she has been encouraged to call the office with any questions or concerns that should arise related to todays visit.    No orders of the defined types were placed in this encounter.   Meds ordered this encounter  Medications   zaleplon (SONATA) 5 MG capsule    Sig: Take 1 capsule (5 mg total) by mouth at bedtime as needed for sleep.    Dispense:  30 capsule    Refill:  2   hydroxychloroquine (PLAQUENIL) 200 MG tablet    Sig: Take 1 tablet (200 mg total) by mouth 2 (two) times daily.    Dispense:  60 tablet    Refill:  2   clonazePAM (KLONOPIN) 0.5  MG tablet    Sig: Take 1 tablet (0.5 mg total) by mouth daily.    Dispense:  90 tablet     Refill:  0   bisoprolol-hydrochlorothiazide (ZIAC) 10-6.25 MG tablet    Sig: TAKE 1 TABLET BY MOUTH EVERY DAY IN THE MORNING FOR BLOOD PRESSURE    Dispense:  30 tablet    Refill:  2    For future refills   amLODipine (NORVASC) 2.5 MG tablet    Sig: Take 1 tablet (2.5 mg total) by mouth daily.    Dispense:  30 tablet    Refill:  2   nortriptyline (PAMELOR) 50 MG capsule    Sig: Take 1 capsule (50 mg total) by mouth at bedtime.    Dispense:  90 capsule    Refill:  1    Patient will call when she needs this med filled   predniSONE (DELTASONE) 10 MG tablet    Sig: Take 1 tablet (10 mg total) by mouth daily with breakfast.    Dispense:  30 tablet    Refill:  2   mirtazapine (REMERON) 30 MG tablet    Sig: Take 1 tablet (30 mg total) by mouth at bedtime.    Dispense:  30 tablet    Refill:  5   metoCLOPramide (REGLAN) 5 MG tablet    Sig: Take 1 tablet (5 mg total) by mouth 4 (four) times daily -  before meals and at bedtime. As needed only    Dispense:  120 tablet    Refill:  3    Patient will call when she needs this med filled    Return in about 3 months (around 10/25/2022) for F/U, med refill, Bhavya Grand PCP.   Total time spent:30 Minutes Time spent includes review of chart, medications, test results, and follow up plan with the patient.   [place] Controlled Substance Database was reviewed by me.  This patient was seen by [name], FNP-C in collaboration with Dr. Beverely Risen as a part of collaborative care agreement.   Kaius Daino R. Tedd Sias, MSN, FNP-C Internal medicine

## 2022-07-26 ENCOUNTER — Encounter: Payer: BC Managed Care – PPO | Admitting: Nurse Practitioner

## 2022-07-31 ENCOUNTER — Encounter: Payer: Self-pay | Admitting: Nurse Practitioner

## 2022-08-03 ENCOUNTER — Other Ambulatory Visit: Payer: Self-pay | Admitting: Nurse Practitioner

## 2022-08-03 DIAGNOSIS — Z760 Encounter for issue of repeat prescription: Secondary | ICD-10-CM

## 2022-08-11 ENCOUNTER — Encounter: Payer: BC Managed Care – PPO | Admitting: Nurse Practitioner

## 2022-08-20 ENCOUNTER — Other Ambulatory Visit: Payer: Self-pay | Admitting: Nurse Practitioner

## 2022-08-20 DIAGNOSIS — Z760 Encounter for issue of repeat prescription: Secondary | ICD-10-CM

## 2022-09-01 ENCOUNTER — Other Ambulatory Visit: Payer: Self-pay | Admitting: Nurse Practitioner

## 2022-09-01 DIAGNOSIS — Z760 Encounter for issue of repeat prescription: Secondary | ICD-10-CM

## 2022-09-05 ENCOUNTER — Encounter: Payer: Self-pay | Admitting: Nurse Practitioner

## 2022-09-05 ENCOUNTER — Ambulatory Visit (INDEPENDENT_AMBULATORY_CARE_PROVIDER_SITE_OTHER): Payer: BC Managed Care – PPO | Admitting: Nurse Practitioner

## 2022-09-05 VITALS — BP 119/84 | HR 82 | Temp 97.0°F | Resp 16 | Ht 66.0 in | Wt 172.0 lb

## 2022-09-05 DIAGNOSIS — R7989 Other specified abnormal findings of blood chemistry: Secondary | ICD-10-CM | POA: Diagnosis not present

## 2022-09-05 DIAGNOSIS — R7303 Prediabetes: Secondary | ICD-10-CM

## 2022-09-05 DIAGNOSIS — M2550 Pain in unspecified joint: Secondary | ICD-10-CM

## 2022-09-05 DIAGNOSIS — Z23 Encounter for immunization: Secondary | ICD-10-CM

## 2022-09-05 DIAGNOSIS — E8941 Symptomatic postprocedural ovarian failure: Secondary | ICD-10-CM | POA: Diagnosis not present

## 2022-09-05 DIAGNOSIS — M359 Systemic involvement of connective tissue, unspecified: Secondary | ICD-10-CM

## 2022-09-05 DIAGNOSIS — R300 Dysuria: Secondary | ICD-10-CM

## 2022-09-05 DIAGNOSIS — Z0001 Encounter for general adult medical examination with abnormal findings: Secondary | ICD-10-CM | POA: Diagnosis not present

## 2022-09-05 DIAGNOSIS — E538 Deficiency of other specified B group vitamins: Secondary | ICD-10-CM

## 2022-09-05 DIAGNOSIS — Z760 Encounter for issue of repeat prescription: Secondary | ICD-10-CM

## 2022-09-05 DIAGNOSIS — E559 Vitamin D deficiency, unspecified: Secondary | ICD-10-CM

## 2022-09-05 DIAGNOSIS — G4700 Insomnia, unspecified: Secondary | ICD-10-CM

## 2022-09-05 DIAGNOSIS — E782 Mixed hyperlipidemia: Secondary | ICD-10-CM

## 2022-09-05 MED ORDER — AMLODIPINE BESYLATE 2.5 MG PO TABS: 2.5000 mg | ORAL_TABLET | Freq: Every day | ORAL | 2 refills | Status: DC

## 2022-09-05 MED ORDER — ESTRADIOL 0.5 MG PO TABS: 0.5000 mg | ORAL_TABLET | Freq: Every day | ORAL | 1 refills | Status: DC

## 2022-09-05 MED ORDER — TETANUS-DIPHTH-ACELL PERTUSSIS 5-2.5-18.5 LF-MCG/0.5 IM SUSP: 0.5000 mL | Freq: Once | INTRAMUSCULAR | 0 refills | Status: AC

## 2022-09-05 MED ORDER — ZALEPLON 5 MG PO CAPS: 5.0000 mg | ORAL_CAPSULE | Freq: Every evening | ORAL | 2 refills | Status: DC | PRN

## 2022-09-05 MED ORDER — ZOSTER VAC RECOMB ADJUVANTED 50 MCG/0.5ML IM SUSR: 0.5000 mL | Freq: Once | INTRAMUSCULAR | 0 refills | Status: AC

## 2022-09-05 MED ORDER — MIRTAZAPINE 30 MG PO TABS: 30.0000 mg | ORAL_TABLET | Freq: Every day | ORAL | 5 refills | Status: DC

## 2022-09-05 MED ORDER — PREDNISONE 10 MG PO TABS: 10.0000 mg | ORAL_TABLET | Freq: Every day | ORAL | 2 refills | Status: DC

## 2022-09-05 NOTE — Progress Notes (Unsigned)
[facility] [address] [place], [place] [address]  Internal MEDICINE  Office Visit Note  Patient Name: Brittany Huynh  [id number]  [id number]  Date of Service: 09/05/2022  Chief Complaint  Patient presents with   Annual Exam    HPI Brittany Huynh presents for an annual well visit and physical exam.  Well-appearing 57 y.o. female with hypertension, migraines, asthma, sarcoidosis, GAD, inflammatory polyarthritis, osteopenia, RLS, and neuropathy.  Routine CRC screening: done in 2022, repeat in 2027.  Routine mammogram: done in June 2023 DEXA scan: due now, will do with next mammogram Labs: due for labs now New or worsening pain: chronic pain Other concerns: still having burning in her feet.     Current Medication: Outpatient Encounter Medications as of 09/05/2022  Medication Sig Note   acetaminophen (TYLENOL) 500 MG tablet Take 500 mg by mouth every 6 (six) hours as needed. 09/29/2020: Can take 1 or 2 at a time   albuterol (VENTOLIN HFA) 108 (90 Base) MCG/ACT inhaler Inhale 2 puffs into the lungs every 6 (six) hours as needed for wheezing or shortness of breath.    bisoprolol-hydrochlorothiazide (ZIAC) 10-6.25 MG tablet TAKE 1 TABLET BY MOUTH EVERY DAY IN THE MORNING FOR BLOOD PRESSURE    busPIRone (BUSPAR) 10 MG tablet Take 10 mg by mouth 2 (two) times daily.    celecoxib (CELEBREX) 200 MG capsule Take 1 capsule (200 mg total) by mouth daily.    clonazePAM (KLONOPIN) 0.5 MG tablet Take 1 tablet (0.5 mg total) by mouth daily.    dicyclomine (BENTYL) 20 MG tablet TAKE 1 TABLET (20 MG TOTAL) BY MOUTH 4 (FOUR) TIMES DAILY - BEFORE MEALS AND AT BEDTIME.    estradiol (ESTRACE) 0.5 MG tablet Take 1 tablet (0.5 mg total) by mouth daily.    hydroxychloroquine (PLAQUENIL) 200 MG tablet TAKE 1 TABLET BY MOUTH TWICE A DAY    metoCLOPramide (REGLAN) 5 MG tablet Take 1 tablet (5 mg total) by mouth 4 (four) times daily -  before meals and at bedtime. As needed only    nortriptyline (PAMELOR)  50 MG capsule Take 1 capsule (50 mg total) by mouth at bedtime.    [DISCONTINUED] amLODipine (NORVASC) 2.5 MG tablet Take 1 tablet (2.5 mg total) by mouth daily.    [DISCONTINUED] mirtazapine (REMERON) 30 MG tablet Take 1 tablet (30 mg total) by mouth at bedtime.    [DISCONTINUED] predniSONE (DELTASONE) 10 MG tablet Take 1 tablet (10 mg total) by mouth daily with breakfast.    [DISCONTINUED] predniSONE (DELTASONE) 5 MG tablet TAKE 2 TABLETS BY MOUTH DAILY WITH BREAKFAST.    [DISCONTINUED] Tdap (BOOSTRIX) 5-2.5-18.5 LF-MCG/0.5 injection Inject 0.5 mLs into the muscle once.    [DISCONTINUED] zaleplon (SONATA) 5 MG capsule Take 1 capsule (5 mg total) by mouth at bedtime as needed for sleep.    [DISCONTINUED] Zoster Vaccine Adjuvanted [facility]) injection Inject 0.5 mLs into the muscle once.    amLODipine (NORVASC) 2.5 MG tablet Take 1 tablet (2.5 mg total) by mouth daily.    mirtazapine (REMERON) 30 MG tablet Take 1 tablet (30 mg total) by mouth at bedtime.    predniSONE (DELTASONE) 10 MG tablet Take 1 tablet (10 mg total) by mouth daily with breakfast.    Tdap (BOOSTRIX) 5-2.5-18.5 LF-MCG/0.5 injection Inject 0.5 mLs into the muscle once for 1 dose.    zaleplon (SONATA) 5 MG capsule Take 1 capsule (5 mg total) by mouth at bedtime as needed for sleep.    Zoster Vaccine Adjuvanted [facility])  injection Inject 0.5 mLs into the muscle once for 1 dose.    No facility-administered encounter medications on file as of 09/05/2022.    Surgical History: Past Surgical History:  Procedure Laterality Date   ABDOMINAL HYSTERECTOMY  2009   BREAST BIOPSY Left 02/15/2018   PREDOMINANTLY MATURE ADIPOSE TISSUE WITH VERY FOCAL AREAS OF BENIGN    BREAST EXCISIONAL BIOPSY     chamberlain procedure   2011   COLONOSCOPY WITH PROPOFOL N/A 09/02/2020   Procedure: COLONOSCOPY WITH PROPOFOL;  Surgeon: Lin Landsman, MD;  Location: [facility] ENDOSCOPY;  Service: Gastroenterology;  Laterality: N/A;    ESOPHAGOGASTRODUODENOSCOPY (EGD) WITH PROPOFOL N/A 09/02/2020   Procedure: ESOPHAGOGASTRODUODENOSCOPY (EGD) WITH PROPOFOL;  Surgeon: Lin Landsman, MD;  Location: [facility] ENDOSCOPY;  Service: Gastroenterology;  Laterality: N/A;   IRRIGATION AND DEBRIDEMENT HEMATOMA Left 03/07/2018   Procedure: IRRIGATION AND DEBRIDEMENT HEMATOMA-LEFT BREAST;  Surgeon: Robert Bellow, MD;  Location: [facility] ORS;  Service: General;  Laterality: Left;   LUNG BIOPSY  2011   RESECTION RIBS EXTRAPLEURAL  [id number]    Medical History: Past Medical History:  Diagnosis Date   Anemia    Anxiety    Asthma    Breast fibrocystic disorder 2013   Edema 09/15/2017   Hernia 2011   chest wall   Iron deficiency anemia 09/15/2017   Lung mass 2011   Sarcoidosis    Shortness of breath    2011   Stroke ([place])    Tachycardia, unspecified 09/15/2017    Family History: Family History  Problem Relation Age of Onset   Liver cancer Mother 22   Bone cancer Maternal Aunt 60   Breast cancer Maternal Aunt    Breast cancer Paternal Aunt    Kidney cancer Father    Bone cancer Paternal Aunt 32    Social History   Socioeconomic History   Marital status: Divorced    Spouse name: Not on file   Number of children: Not on file   Years of education: Not on file   Highest education level: Not on file  Occupational History   Not on file  Tobacco Use   Smoking status: Never   Smokeless tobacco: Never  Vaping Use   Vaping Use: Never used  Substance and Sexual Activity   Alcohol use: Not Currently    Comment: on occassion   Drug use: No   Sexual activity: Not on file  Other Topics Concern   Not on file  Social History Narrative   Not on file   Social Determinants of Health   Financial Resource Strain: Not on file  Food Insecurity: Not on file  Transportation Needs: Not on file  Physical Activity: Not on file  Stress: Not on file  Social Connections: Not on file  Intimate Partner Violence: Not on file       Review of Systems  Constitutional:  Negative for activity change, appetite change, chills, fatigue, fever and unexpected weight change.  HENT: Negative.  Negative for congestion, ear pain, rhinorrhea, sore throat and trouble swallowing.   Eyes: Negative.   Respiratory:  Negative for cough, chest tightness, shortness of breath and wheezing.   Cardiovascular: Negative.  Negative for chest pain.  Gastrointestinal: Negative.  Negative for abdominal pain, blood in stool, constipation, diarrhea, nausea and vomiting.  Endocrine: Negative.   Genitourinary: Negative.  Negative for difficulty urinating, dysuria, frequency, hematuria and urgency.  Musculoskeletal:  Positive for arthralgias, back pain and myalgias. Negative for joint swelling and neck pain.  Skin:  Negative.  Negative for rash and wound.  Allergic/Immunologic: Negative.  Negative for immunocompromised state.  Neurological: Negative.  Negative for dizziness, seizures, numbness and headaches.  Hematological: Negative.   Psychiatric/Behavioral: Negative.  Negative for behavioral problems, self-injury and suicidal ideas. The patient is not nervous/anxious.     Vital Signs: BP 119/84   Pulse 82   Temp (!) 97 F (36.1 C)   Resp 16   Ht 5\' 6"  (1.676 m)   Wt 172 lb (78 kg)   SpO2 97%   BMI 27.76 kg/m    Physical Exam Constitutional:      General: She is awake. She is not in acute distress.    Appearance: Normal appearance. She is well-developed, well-groomed and overweight. She is not diaphoretic.  HENT:     Head: Normocephalic and atraumatic.     Right Ear: Tympanic membrane, ear canal and external ear normal.     Left Ear: Tympanic membrane, ear canal and external ear normal.     Nose: Nose normal.     Mouth/Throat:     Lips: Pink.     Mouth: Mucous membranes are moist.     Pharynx: Oropharynx is clear. Uvula midline. No oropharyngeal exudate or posterior oropharyngeal erythema.  Eyes:     General: Lids are normal.  Vision grossly intact. Gaze aligned appropriately. No scleral icterus.       Right eye: No discharge.        Left eye: No discharge.     Conjunctiva/sclera: Conjunctivae normal.     Pupils: Pupils are equal, round, and reactive to light.     Funduscopic exam:    Right eye: Red reflex present.        Left eye: Red reflex present. Neck:     Thyroid: No thyromegaly.     Vascular: No JVD.     Trachea: Trachea and phonation normal. No tracheal deviation.  Cardiovascular:     Rate and Rhythm: Normal rate and regular rhythm.     Pulses: Normal pulses.     Heart sounds: Normal heart sounds, S1 normal and S2 normal. No murmur heard.    No friction rub. No gallop.  Pulmonary:     Effort: Pulmonary effort is normal. No accessory muscle usage or respiratory distress.     Breath sounds: Normal breath sounds and air entry. No stridor. No wheezing or rales.  Chest:     Chest wall: No tenderness.     Comments: Declined clinical breast exam Abdominal:     General: Bowel sounds are normal. There is no distension.     Palpations: Abdomen is soft. There is no shifting dullness, fluid wave, mass or pulsatile mass.     Tenderness: There is no abdominal tenderness. There is no guarding or rebound.  Musculoskeletal:        General: No tenderness or deformity. Normal range of motion.     Cervical back: Normal range of motion and neck supple.  Lymphadenopathy:     Cervical: No cervical adenopathy.  Skin:    General: Skin is warm and dry.     Capillary Refill: Capillary refill takes less than 2 seconds.     Coloration: Skin is not pale.     Findings: No erythema or rash.  Neurological:     Mental Status: She is alert and oriented to person, place, and time.     Cranial Nerves: No cranial nerve deficit.     Motor: No abnormal muscle tone.  Coordination: Coordination normal.     Gait: Gait normal.     Deep Tendon Reflexes: Reflexes are normal and symmetric.  Psychiatric:        Mood and Affect:  Affect normal. Mood is anxious.        Behavior: Behavior normal. Behavior is cooperative.        Thought Content: Thought content normal.        Cognition and Memory: Memory is impaired.        Judgment: Judgment normal.        Assessment/Plan:     General Counseling: Val Eagle understanding of the findings of todays visit and agrees with plan of treatment. I have discussed any further diagnostic evaluation that may be needed or ordered today. We also reviewed her medications today. she has been encouraged to call the office with any questions or concerns that should arise related to todays visit.    Orders Placed This Encounter  Procedures   CBC with Differential/Platelet   CMP14+EGFR   Lipid Profile   Hgb A1C w/o eAG   B12 and Folate Panel   Vitamin D (25 hydroxy)   Parathyroid hormone, intact (no Ca)   Phosphorus    Meds ordered this encounter  Medications   Tdap (BOOSTRIX) 5-2.5-18.5 LF-MCG/0.5 injection    Sig: Inject 0.5 mLs into the muscle once for 1 dose.    Dispense:  0.5 mL    Refill:  0   Zoster Vaccine Adjuvanted [facility]) injection    Sig: Inject 0.5 mLs into the muscle once for 1 dose.    Dispense:  0.5 mL    Refill:  0   mirtazapine (REMERON) 30 MG tablet    Sig: Take 1 tablet (30 mg total) by mouth at bedtime.    Dispense:  30 tablet    Refill:  5    Please fill ASAP and call patient when they are ready   predniSONE (DELTASONE) 10 MG tablet    Sig: Take 1 tablet (10 mg total) by mouth daily with breakfast.    Dispense:  30 tablet    Refill:  2    Please fill ASAP and call patient when they are ready   zaleplon (SONATA) 5 MG capsule    Sig: Take 1 capsule (5 mg total) by mouth at bedtime as needed for sleep.    Dispense:  30 capsule    Refill:  2    Please fill ASAP and call patient when they are ready   amLODipine (NORVASC) 2.5 MG tablet    Sig: Take 1 tablet (2.5 mg total) by mouth daily.    Dispense:  30 tablet    Refill:  2     Please fill ASAP and call patient when they are ready   estradiol (ESTRACE) 0.5 MG tablet    Sig: Take 1 tablet (0.5 mg total) by mouth daily.    Dispense:  30 tablet    Refill:  1    Return in about 4 weeks (around 10/03/2022) for F/U, eval new med, Lorenza Winkleman PCP.   Total time spent:30 Minutes Time spent includes review of chart, medications, test results, and follow up plan with the patient.   [place] Controlled Substance Database was reviewed by me.  This patient was seen by [name], FNP-C in collaboration with Dr. Beverely Risen as a part of collaborative care agreement.  Deen Deguia R. Tedd Sias, MSN, FNP-C Internal medicine

## 2022-09-06 ENCOUNTER — Encounter: Payer: Self-pay | Admitting: Nurse Practitioner

## 2022-09-06 DIAGNOSIS — E8941 Symptomatic postprocedural ovarian failure: Secondary | ICD-10-CM | POA: Insufficient documentation

## 2022-09-06 DIAGNOSIS — R7303 Prediabetes: Secondary | ICD-10-CM | POA: Insufficient documentation

## 2022-09-06 DIAGNOSIS — R7989 Other specified abnormal findings of blood chemistry: Secondary | ICD-10-CM | POA: Insufficient documentation

## 2022-09-06 LAB — UA/M W/RFLX CULTURE, ROUTINE
Bilirubin, UA: NEGATIVE
Glucose, UA: NEGATIVE
Leukocytes,UA: NEGATIVE
Nitrite, UA: NEGATIVE
RBC, UA: NEGATIVE
Specific Gravity, UA: 1.018 (ref 1.005–1.030)
Urobilinogen, Ur: 1 mg/dL (ref 0.2–1.0)
pH, UA: 6 (ref 5.0–7.5)

## 2022-09-06 LAB — MICROSCOPIC EXAMINATION: RBC, Urine: NONE SEEN /hpf (ref 0–2)

## 2022-09-12 ENCOUNTER — Other Ambulatory Visit: Payer: Self-pay | Admitting: Internal Medicine

## 2022-09-12 DIAGNOSIS — D860 Sarcoidosis of lung: Secondary | ICD-10-CM

## 2022-09-12 DIAGNOSIS — Z760 Encounter for issue of repeat prescription: Secondary | ICD-10-CM

## 2022-09-13 ENCOUNTER — Encounter: Payer: Self-pay | Admitting: Nurse Practitioner

## 2022-09-13 ENCOUNTER — Telehealth: Payer: BC Managed Care – PPO | Admitting: Nurse Practitioner

## 2022-09-13 ENCOUNTER — Telehealth: Payer: Self-pay

## 2022-09-13 ENCOUNTER — Other Ambulatory Visit: Payer: Self-pay

## 2022-09-13 VITALS — Resp 16 | Ht 66.0 in

## 2022-09-13 DIAGNOSIS — I10 Essential (primary) hypertension: Secondary | ICD-10-CM | POA: Diagnosis not present

## 2022-09-13 DIAGNOSIS — Z6828 Body mass index (BMI) 28.0-28.9, adult: Secondary | ICD-10-CM | POA: Diagnosis not present

## 2022-09-13 DIAGNOSIS — U071 COVID-19: Secondary | ICD-10-CM | POA: Diagnosis not present

## 2022-09-13 DIAGNOSIS — Z789 Other specified health status: Secondary | ICD-10-CM | POA: Diagnosis not present

## 2022-09-13 DIAGNOSIS — J0110 Acute frontal sinusitis, unspecified: Secondary | ICD-10-CM

## 2022-09-13 DIAGNOSIS — R059 Cough, unspecified: Secondary | ICD-10-CM | POA: Diagnosis not present

## 2022-09-13 DIAGNOSIS — R5383 Other fatigue: Secondary | ICD-10-CM | POA: Diagnosis not present

## 2022-09-13 MED ORDER — BENZONATATE 200 MG PO CAPS: 200.0000 mg | ORAL_CAPSULE | Freq: Two times a day (BID) | ORAL | 0 refills | Status: DC | PRN

## 2022-09-13 MED ORDER — AZITHROMYCIN 250 MG PO TABS: ORAL_TABLET | ORAL | 0 refills | Status: AC

## 2022-09-13 NOTE — Telephone Encounter (Signed)
Spoke with pt gave her paxlovid  from  [facility]

## 2022-09-13 NOTE — Progress Notes (Signed)
[facility] [address] [place], [place] [address]  Internal MEDICINE  Telephone Visit  Patient Name: Brittany Huynh  [id number]  [id number]  Date of Service: 09/13/2022  I connected with the patient at 0900 by telephone and verified the patients identity using two identifiers.   I discussed the limitations, risks, security and privacy concerns of performing an evaluation and management service by telephone and the availability of in person appointments. I also discussed with the patient that there may be a patient responsible charge related to the service.  The patient expressed understanding and agrees to proceed.    Chief Complaint  Patient presents with   Telephone Assessment    Coughing, headache, body ache, chills     HPI Brittany Huynh presents for a telehealth virtual visit for  Onset was Saturday night.  Negative home covid test Having symptoms of flu and covid, sent to [facility] for test, will call with results.  Tx for now w/o flu or covid tx.     Current Medication: Outpatient Encounter Medications as of 09/13/2022  Medication Sig Note   acetaminophen (TYLENOL) 500 MG tablet Take 500 mg by mouth every 6 (six) hours as needed. 09/29/2020: Can take 1 or 2 at a time   albuterol (VENTOLIN HFA) 108 (90 Base) MCG/ACT inhaler Inhale 2 puffs into the lungs every 6 (six) hours as needed for wheezing or shortness of breath.    amLODipine (NORVASC) 2.5 MG tablet Take 1 tablet (2.5 mg total) by mouth daily.    azithromycin (ZITHROMAX) 250 MG tablet Take 2 tablets on day 1, then 1 tablet daily on days 2 through 5    benzonatate (TESSALON) 200 MG capsule Take 1 capsule (200 mg total) by mouth 2 (two) times daily as needed for cough.    bisoprolol-hydrochlorothiazide (ZIAC) 10-6.25 MG tablet TAKE 1 TABLET BY MOUTH EVERY DAY IN THE MORNING FOR BLOOD PRESSURE    busPIRone (BUSPAR) 10 MG tablet Take 10 mg by mouth 2 (two) times daily.    celecoxib (CELEBREX) 200 MG capsule  Take 1 capsule (200 mg total) by mouth daily.    clonazePAM (KLONOPIN) 0.5 MG tablet Take 1 tablet (0.5 mg total) by mouth daily.    dicyclomine (BENTYL) 20 MG tablet TAKE 1 TABLET (20 MG TOTAL) BY MOUTH 4 (FOUR) TIMES DAILY - BEFORE MEALS AND AT BEDTIME.    estradiol (ESTRACE) 0.5 MG tablet Take 1 tablet (0.5 mg total) by mouth daily.    hydroxychloroquine (PLAQUENIL) 200 MG tablet TAKE 1 TABLET BY MOUTH TWICE A DAY    metoCLOPramide (REGLAN) 5 MG tablet Take 1 tablet (5 mg total) by mouth 4 (four) times daily -  before meals and at bedtime. As needed only    mirtazapine (REMERON) 30 MG tablet Take 1 tablet (30 mg total) by mouth at bedtime.    nortriptyline (PAMELOR) 50 MG capsule Take 1 capsule (50 mg total) by mouth at bedtime.    predniSONE (DELTASONE) 10 MG tablet Take 1 tablet (10 mg total) by mouth daily with breakfast.    zaleplon (SONATA) 5 MG capsule Take 1 capsule (5 mg total) by mouth at bedtime as needed for sleep.    No facility-administered encounter medications on file as of 09/13/2022.    Surgical History: Past Surgical History:  Procedure Laterality Date   ABDOMINAL HYSTERECTOMY  2009   BREAST BIOPSY Left 02/15/2018   PREDOMINANTLY MATURE ADIPOSE TISSUE WITH VERY FOCAL AREAS OF BENIGN    BREAST EXCISIONAL BIOPSY  chamberlain procedure   2011   COLONOSCOPY WITH PROPOFOL N/A 09/02/2020   Procedure: COLONOSCOPY WITH PROPOFOL;  Surgeon: Lin Landsman, MD;  Location: [facility] ENDOSCOPY;  Service: Gastroenterology;  Laterality: N/A;   ESOPHAGOGASTRODUODENOSCOPY (EGD) WITH PROPOFOL N/A 09/02/2020   Procedure: ESOPHAGOGASTRODUODENOSCOPY (EGD) WITH PROPOFOL;  Surgeon: Lin Landsman, MD;  Location: [facility] ENDOSCOPY;  Service: Gastroenterology;  Laterality: N/A;   IRRIGATION AND DEBRIDEMENT HEMATOMA Left 03/07/2018   Procedure: IRRIGATION AND DEBRIDEMENT HEMATOMA-LEFT BREAST;  Surgeon: Robert Bellow, MD;  Location: [facility] ORS;  Service: General;  Laterality: Left;   LUNG  BIOPSY  2011   RESECTION RIBS EXTRAPLEURAL  [id number]    Medical History: Past Medical History:  Diagnosis Date   Anemia    Anxiety    Asthma    Breast fibrocystic disorder 2013   Edema 09/15/2017   Hernia 2011   chest wall   Iron deficiency anemia 09/15/2017   Lung mass 2011   Sarcoidosis    Shortness of breath    2011   Stroke ([place])    Tachycardia, unspecified 09/15/2017    Family History: Family History  Problem Relation Age of Onset   Liver cancer Mother 83   Bone cancer Maternal Aunt 60   Breast cancer Maternal Aunt    Breast cancer Paternal Aunt    Kidney cancer Father    Bone cancer Paternal Aunt 57    Social History   Socioeconomic History   Marital status: Divorced    Spouse name: Not on file   Number of children: Not on file   Years of education: Not on file   Highest education level: Not on file  Occupational History   Not on file  Tobacco Use   Smoking status: Never   Smokeless tobacco: Never  Vaping Use   Vaping Use: Never used  Substance and Sexual Activity   Alcohol use: Not Currently    Comment: on occassion   Drug use: No   Sexual activity: Not on file  Other Topics Concern   Not on file  Social History Narrative   Not on file   Social Determinants of Health   Financial Resource Strain: Not on file  Food Insecurity: Not on file  Transportation Needs: Not on file  Physical Activity: Not on file  Stress: Not on file  Social Connections: Not on file  Intimate Partner Violence: Not on file      Review of Systems  Constitutional:  Positive for appetite change, chills and fatigue. Negative for fever.  HENT:  Positive for congestion, postnasal drip, rhinorrhea, sinus pressure and sore throat. Negative for ear pain.   Respiratory:  Positive for cough. Negative for chest tightness, shortness of breath and wheezing.   Cardiovascular: Negative.  Negative for chest pain and palpitations.  Gastrointestinal: Negative.  Negative for diarrhea  and rectal pain.  Musculoskeletal:  Positive for myalgias.  Neurological:  Positive for headaches.    Vital Signs: Resp 16   Ht 5\' 6"  (1.676 m)   BMI 27.76 kg/m    Observation/Objective: She is alert and oriented and engages in conversation appropriately. No acute distress noted.     Assessment/Plan: 1. Acute non-recurrent frontal sinusitis Zpak and benzonatate prescribed. Also sent patient for testing at [facility] to rule out RSV, flu and covid. Will adjust treatment accordingly when results are available.    General Counseling: calleigh lafontant understanding of the findings of today's phone visit and agrees with plan of treatment. I have discussed any  further diagnostic evaluation that may be needed or ordered today. We also reviewed her medications today. she has been encouraged to call the office with any questions or concerns that should arise related to todays visit.  Return if symptoms worsen or fail to improve, for will call with results of respiratory panel. .   No orders of the defined types were placed in this encounter.   Meds ordered this encounter  Medications   azithromycin (ZITHROMAX) 250 MG tablet    Sig: Take 2 tablets on day 1, then 1 tablet daily on days 2 through 5    Dispense:  6 tablet    Refill:  0   benzonatate (TESSALON) 200 MG capsule    Sig: Take 1 capsule (200 mg total) by mouth 2 (two) times daily as needed for cough.    Dispense:  30 capsule    Refill:  0    Time spent:30 Minutes Time spent with patient included reviewing progress notes, labs, imaging studies, and discussing plan for follow up.  [place] Controlled Substance Database was reviewed by me for overdose risk score (ORS) if appropriate.  This patient was seen by Jonetta Osgood, FNP-C in collaboration with Dr. Clayborn Bigness as a part of collaborative care agreement.  Julis Haubner R. Valetta Fuller, MSN, FNP-C Internal medicine

## 2022-09-13 NOTE — Telephone Encounter (Signed)
Faxed [facility] [phone] for covid,rsv and Flu

## 2022-10-01 ENCOUNTER — Other Ambulatory Visit: Payer: Self-pay | Admitting: Nurse Practitioner

## 2022-10-01 DIAGNOSIS — E8941 Symptomatic postprocedural ovarian failure: Secondary | ICD-10-CM

## 2022-10-02 ENCOUNTER — Other Ambulatory Visit: Payer: Self-pay | Admitting: Nurse Practitioner

## 2022-10-02 DIAGNOSIS — Z760 Encounter for issue of repeat prescription: Secondary | ICD-10-CM

## 2022-10-03 ENCOUNTER — Ambulatory Visit: Payer: BC Managed Care – PPO | Admitting: Nurse Practitioner

## 2022-10-17 ENCOUNTER — Ambulatory Visit: Payer: BC Managed Care – PPO | Admitting: Nurse Practitioner

## 2022-10-26 DIAGNOSIS — Z1231 Encounter for screening mammogram for malignant neoplasm of breast: Secondary | ICD-10-CM | POA: Diagnosis not present

## 2022-12-05 DIAGNOSIS — F411 Generalized anxiety disorder: Secondary | ICD-10-CM | POA: Diagnosis not present

## 2022-12-05 DIAGNOSIS — R413 Other amnesia: Secondary | ICD-10-CM | POA: Diagnosis not present

## 2022-12-05 DIAGNOSIS — G479 Sleep disorder, unspecified: Secondary | ICD-10-CM | POA: Diagnosis not present

## 2022-12-19 ENCOUNTER — Encounter: Payer: Self-pay | Admitting: Nurse Practitioner

## 2022-12-19 ENCOUNTER — Ambulatory Visit: Payer: BC Managed Care – PPO | Admitting: Nurse Practitioner

## 2022-12-19 VITALS — BP 128/80 | HR 78 | Temp 97.8°F | Resp 16 | Ht 66.0 in | Wt 173.0 lb

## 2022-12-19 DIAGNOSIS — M25562 Pain in left knee: Secondary | ICD-10-CM

## 2022-12-19 DIAGNOSIS — E8941 Symptomatic postprocedural ovarian failure: Secondary | ICD-10-CM | POA: Diagnosis not present

## 2022-12-19 DIAGNOSIS — M25561 Pain in right knee: Secondary | ICD-10-CM | POA: Diagnosis not present

## 2022-12-19 DIAGNOSIS — Z79899 Other long term (current) drug therapy: Secondary | ICD-10-CM | POA: Diagnosis not present

## 2022-12-19 DIAGNOSIS — G8929 Other chronic pain: Secondary | ICD-10-CM

## 2022-12-19 MED ORDER — PREDNISONE 10 MG PO TABS: 10.0000 mg | ORAL_TABLET | Freq: Every day | ORAL | 2 refills | Status: DC

## 2022-12-19 MED ORDER — HYDROXYCHLOROQUINE SULFATE 200 MG PO TABS: 200.0000 mg | ORAL_TABLET | Freq: Two times a day (BID) | ORAL | 1 refills | Status: DC

## 2022-12-19 MED ORDER — CLONAZEPAM 0.5 MG PO TABS: 0.5000 mg | ORAL_TABLET | Freq: Every day | ORAL | 0 refills | Status: DC

## 2022-12-19 MED ORDER — MIRTAZAPINE 30 MG PO TABS: 30.0000 mg | ORAL_TABLET | Freq: Every day | ORAL | 5 refills | Status: DC

## 2022-12-19 MED ORDER — METOCLOPRAMIDE HCL 5 MG PO TABS: 5.0000 mg | ORAL_TABLET | Freq: Three times a day (TID) | ORAL | 3 refills | Status: DC

## 2022-12-19 MED ORDER — ESTRADIOL 0.5 MG PO TABS: 0.5000 mg | ORAL_TABLET | Freq: Every day | ORAL | 1 refills | Status: DC

## 2022-12-19 NOTE — Progress Notes (Signed)
[facility] [address] [place], [place] [address]  Internal MEDICINE  Office Visit Note  Patient Name: Brittany Huynh  [id number]  [id number]  Date of Service: 12/19/2022  Chief Complaint  Patient presents with   Follow-up   Hypertension   Asthma    HPI Brittany Huynh presents for a follow-up visit for chronic pain, arthritis and med refills.  Seen by neuro -- on nortriptyline and seroquel. Tried buspar but did not like how it made her feel.  Refills, switching pharmacy to [facility]. Did not get some of her medications that were sent to [facility] due to pharmacy issues. Arthritis is about the same, both knees have been hurting more and making cracking and popping noises a lot.    Current Medication: Outpatient Encounter Medications as of 12/19/2022  Medication Sig Note   acetaminophen (TYLENOL) 500 MG tablet Take 500 mg by mouth every 6 (six) hours as needed. 09/29/2020: Can take 1 or 2 at a time   albuterol (VENTOLIN HFA) 108 (90 Base) MCG/ACT inhaler Inhale 2 puffs into the lungs every 6 (six) hours as needed for wheezing or shortness of breath.    amLODipine (NORVASC) 2.5 MG tablet TAKE 1 TABLET BY MOUTH EVERY DAY    bisoprolol-hydrochlorothiazide (ZIAC) 10-6.25 MG tablet TAKE 1 TABLET BY MOUTH EVERY DAY IN THE MORNING FOR BLOOD PRESSURE    celecoxib (CELEBREX) 200 MG capsule Take 1 capsule (200 mg total) by mouth daily.    dicyclomine (BENTYL) 20 MG tablet TAKE 1 TABLET (20 MG TOTAL) BY MOUTH 4 (FOUR) TIMES DAILY - BEFORE MEALS AND AT BEDTIME.    nortriptyline (PAMELOR) 50 MG capsule Take 1 capsule (50 mg total) by mouth at bedtime.    [DISCONTINUED] benzonatate (TESSALON) 200 MG capsule Take 1 capsule (200 mg total) by mouth 2 (two) times daily as needed for cough.    [DISCONTINUED] busPIRone (BUSPAR) 10 MG tablet Take 10 mg by mouth 2 (two) times daily.    [DISCONTINUED] clonazePAM (KLONOPIN) 0.5 MG tablet Take 1 tablet (0.5 mg total) by mouth daily.    [DISCONTINUED] estradiol  (ESTRACE) 0.5 MG tablet TAKE 1 TABLET BY MOUTH EVERY DAY    [DISCONTINUED] hydroxychloroquine (PLAQUENIL) 200 MG tablet TAKE 1 TABLET BY MOUTH TWICE A DAY    [DISCONTINUED] metoCLOPramide (REGLAN) 5 MG tablet Take 1 tablet (5 mg total) by mouth 4 (four) times daily -  before meals and at bedtime. As needed only    [DISCONTINUED] mirtazapine (REMERON) 30 MG tablet Take 1 tablet (30 mg total) by mouth at bedtime.    [DISCONTINUED] predniSONE (DELTASONE) 10 MG tablet Take 1 tablet (10 mg total) by mouth daily with breakfast.    [DISCONTINUED] zaleplon (SONATA) 5 MG capsule Take 1 capsule (5 mg total) by mouth at bedtime as needed for sleep.    clonazePAM (KLONOPIN) 0.5 MG tablet Take 1 tablet (0.5 mg total) by mouth daily.    estradiol (ESTRACE) 0.5 MG tablet Take 1 tablet (0.5 mg total) by mouth daily.    hydroxychloroquine (PLAQUENIL) 200 MG tablet Take 1 tablet (200 mg total) by mouth 2 (two) times daily.    metoCLOPramide (REGLAN) 5 MG tablet Take 1 tablet (5 mg total) by mouth 4 (four) times daily -  before meals and at bedtime. As needed only    mirtazapine (REMERON) 30 MG tablet Take 1 tablet (30 mg total) by mouth at bedtime.    predniSONE (DELTASONE) 10 MG tablet Take 1 tablet (10 mg total) by mouth daily  with breakfast.    No facility-administered encounter medications on file as of 12/19/2022.    Surgical History: Past Surgical History:  Procedure Laterality Date   ABDOMINAL HYSTERECTOMY  2009   BREAST BIOPSY Left 02/15/2018   PREDOMINANTLY MATURE ADIPOSE TISSUE WITH VERY FOCAL AREAS OF BENIGN    BREAST EXCISIONAL BIOPSY     chamberlain procedure   2011   COLONOSCOPY WITH PROPOFOL N/A 09/02/2020   Procedure: COLONOSCOPY WITH PROPOFOL;  Surgeon: Toney Reil, MD;  Location: [facility] ENDOSCOPY;  Service: Gastroenterology;  Laterality: N/A;   ESOPHAGOGASTRODUODENOSCOPY (EGD) WITH PROPOFOL N/A 09/02/2020   Procedure: ESOPHAGOGASTRODUODENOSCOPY (EGD) WITH PROPOFOL;  Surgeon: Toney Reil, MD;  Location: [facility] ENDOSCOPY;  Service: Gastroenterology;  Laterality: N/A;   IRRIGATION AND DEBRIDEMENT HEMATOMA Left 03/07/2018   Procedure: IRRIGATION AND DEBRIDEMENT HEMATOMA-LEFT BREAST;  Surgeon: Earline Mayotte, MD;  Location: [facility] ORS;  Service: General;  Laterality: Left;   LUNG BIOPSY  2011   RESECTION RIBS EXTRAPLEURAL  [id number]    Medical History: Past Medical History:  Diagnosis Date   Anemia    Anxiety    Asthma    Breast fibrocystic disorder 2013   Edema 09/15/2017   Hernia 2011   chest wall   Hypertension    Iron deficiency anemia 09/15/2017   Lung mass 2011   Sarcoidosis    Shortness of breath    2011   Stroke    Tachycardia, unspecified 09/15/2017    Family History: Family History  Problem Relation Age of Onset   Liver cancer Mother 36   Bone cancer Maternal Aunt 60   Breast cancer Maternal Aunt    Breast cancer Paternal Aunt    Kidney cancer Father    Bone cancer Paternal Aunt 39    Social History   Socioeconomic History   Marital status: Divorced    Spouse name: Not on file   Number of children: Not on file   Years of education: Not on file   Highest education level: Not on file  Occupational History   Not on file  Tobacco Use   Smoking status: Never   Smokeless tobacco: Never  Vaping Use   Vaping Use: Never used  Substance and Sexual Activity   Alcohol use: Not Currently    Comment: on occassion   Drug use: No   Sexual activity: Not on file  Other Topics Concern   Not on file  Social History Narrative   Not on file   Social Determinants of Health   Financial Resource Strain: Not on file  Food Insecurity: Not on file  Transportation Needs: Not on file  Physical Activity: Not on file  Stress: Not on file  Social Connections: Not on file  Intimate Partner Violence: Not on file      Review of Systems  HENT:  Positive for postnasal drip, rhinorrhea and sneezing. Negative for congestion, sinus pressure, sinus pain and  sore throat.   Respiratory:  Negative for cough, chest tightness, shortness of breath and wheezing.   Cardiovascular: Negative.  Negative for chest pain and palpitations.  Gastrointestinal:  Positive for abdominal pain.  Musculoskeletal:  Positive for arthralgias (chronic knee pain), back pain, gait problem and myalgias.  Neurological:  Positive for weakness and numbness.    Vital Signs: BP 128/80   Pulse 78   Temp 97.8 F (36.6 C)   Resp 16   Ht  (1.676 m)   Wt 173 lb (78.5 kg)   SpO2 95%  BMI 27.92 kg/m    Physical Exam Vitals reviewed.  Constitutional:      General: She is not in acute distress.    Appearance: Normal appearance.  HENT:     Head: Normocephalic and atraumatic.  Eyes:     Pupils: Pupils are equal, round, and reactive to light.  Cardiovascular:     Rate and Rhythm: Normal rate and regular rhythm.  Pulmonary:     Effort: Pulmonary effort is normal. No respiratory distress.  Musculoskeletal:     Right knee: Swelling and crepitus present. Decreased range of motion. Tenderness present.     Left knee: Swelling and crepitus present. Decreased range of motion. Tenderness present.  Neurological:     Mental Status: She is alert and oriented to person, place, and time.     Motor: Weakness present.  Psychiatric:        Mood and Affect: Mood normal.        Behavior: Behavior normal.        Assessment/Plan: 1. Bilateral chronic knee pain Bilateral knee xrays ordered, will most likely refer to ortho pending results.  - DG Knee Complete 4 Views Left; Future - DG Knee Complete 4 Views Right; Future  2. Hot flashes due to surgical menopause Reorder estradiol, patient was not able to pick up before.  - estradiol (ESTRACE) 0.5 MG tablet; Take 1 tablet (0.5 mg total) by mouth daily.  Dispense: 90 tablet; Refill: 1  3. Encounter for medication review Refills ordered. Medication list reviewed and updated. - predniSONE (DELTASONE) 10 MG tablet; Take 1  tablet (10 mg total) by mouth daily with breakfast.  Dispense: 30 tablet; Refill: 2 - mirtazapine (REMERON) 30 MG tablet; Take 1 tablet (30 mg total) by mouth at bedtime.  Dispense: 30 tablet; Refill: 5 - metoCLOPramide (REGLAN) 5 MG tablet; Take 1 tablet (5 mg total) by mouth 4 (four) times daily -  before meals and at bedtime. As needed only  Dispense: 120 tablet; Refill: 3 - hydroxychloroquine (PLAQUENIL) 200 MG tablet; Take 1 tablet (200 mg total) by mouth 2 (two) times daily.  Dispense: 180 tablet; Refill: 1 - clonazePAM (KLONOPIN) 0.5 MG tablet; Take 1 tablet (0.5 mg total) by mouth daily.  Dispense: 90 tablet; Refill: 0   General Counseling: Greg verbalizes understanding of the findings of todays visit and agrees with plan of treatment. I have discussed any further diagnostic evaluation that may be needed or ordered today. We also reviewed her medications today. she has been encouraged to call the office with any questions or concerns that should arise related to todays visit.    Orders Placed This Encounter  Procedures   DG Knee Complete 4 Views Left   DG Knee Complete 4 Views Right    Meds ordered this encounter  Medications   predniSONE (DELTASONE) 10 MG tablet    Sig: Take 1 tablet (10 mg total) by mouth daily with breakfast.    Dispense:  30 tablet    Refill:  2    Please fill ASAP and call patient when they are ready   mirtazapine (REMERON) 30 MG tablet    Sig: Take 1 tablet (30 mg total) by mouth at bedtime.    Dispense:  30 tablet    Refill:  5    Please fill ASAP and call patient when they are ready   metoCLOPramide (REGLAN) 5 MG tablet    Sig: Take 1 tablet (5 mg total) by mouth 4 (four) times daily -  before  meals and at bedtime. As needed only    Dispense:  120 tablet    Refill:  3    Patient will call when she needs this med filled   hydroxychloroquine (PLAQUENIL) 200 MG tablet    Sig: Take 1 tablet (200 mg total) by mouth 2 (two) times daily.    Dispense:   180 tablet    Refill:  1   estradiol (ESTRACE) 0.5 MG tablet    Sig: Take 1 tablet (0.5 mg total) by mouth daily.    Dispense:  90 tablet    Refill:  1   clonazePAM (KLONOPIN) 0.5 MG tablet    Sig: Take 1 tablet (0.5 mg total) by mouth daily.    Dispense:  90 tablet    Refill:  0    Return in about 3 months (around 03/14/2023) for F/U, anxiety med refill, Gwendola Hornaday PCP.   Total time spent:30 Minutes Time spent includes review of chart, medications, test results, and follow up plan with the patient.   [place] Controlled Substance Database was reviewed by me.  This patient was seen by [name], FNP-C in collaboration with Dr. Beverely Risen as a part of collaborative care agreement.   Hans Rusher R. Tedd Sias, MSN, FNP-C Internal medicine

## 2023-03-05 ENCOUNTER — Other Ambulatory Visit: Payer: Self-pay | Admitting: Nurse Practitioner

## 2023-03-05 DIAGNOSIS — Z760 Encounter for issue of repeat prescription: Secondary | ICD-10-CM

## 2023-03-13 ENCOUNTER — Ambulatory Visit: Payer: BC Managed Care – PPO | Admitting: Nurse Practitioner

## 2023-03-13 ENCOUNTER — Encounter: Payer: Self-pay | Admitting: Nurse Practitioner

## 2023-03-13 VITALS — BP 120/78 | HR 85 | Temp 96.8°F | Resp 16 | Ht 66.0 in | Wt 174.4 lb

## 2023-03-13 DIAGNOSIS — D860 Sarcoidosis of lung: Secondary | ICD-10-CM | POA: Diagnosis not present

## 2023-03-13 DIAGNOSIS — M064 Inflammatory polyarthropathy: Secondary | ICD-10-CM | POA: Diagnosis not present

## 2023-03-13 DIAGNOSIS — N39490 Overflow incontinence: Secondary | ICD-10-CM

## 2023-03-13 DIAGNOSIS — Z79899 Other long term (current) drug therapy: Secondary | ICD-10-CM

## 2023-03-13 DIAGNOSIS — N393 Stress incontinence (female) (male): Secondary | ICD-10-CM

## 2023-03-13 MED ORDER — MIRTAZAPINE 30 MG PO TABS: 30.0000 mg | ORAL_TABLET | Freq: Every day | ORAL | 5 refills | Status: DC

## 2023-03-13 MED ORDER — CELECOXIB 200 MG PO CAPS: 200.0000 mg | ORAL_CAPSULE | Freq: Every day | ORAL | 3 refills | Status: DC

## 2023-03-13 MED ORDER — OXYBUTYNIN CHLORIDE ER 10 MG PO TB24
10.0000 mg | ORAL_TABLET | Freq: Every day | ORAL | 2 refills | Status: DC
Start: 2023-03-13 — End: 2023-04-27

## 2023-03-13 MED ORDER — CLONAZEPAM 0.5 MG PO TABS
0.5000 mg | ORAL_TABLET | Freq: Every day | ORAL | 0 refills | Status: DC
Start: 2023-03-13 — End: 2023-06-05

## 2023-03-13 MED ORDER — HYDROXYCHLOROQUINE SULFATE 200 MG PO TABS
200.0000 mg | ORAL_TABLET | Freq: Two times a day (BID) | ORAL | 1 refills | Status: DC
Start: 2023-03-13 — End: 2023-12-11

## 2023-03-13 MED ORDER — ALBUTEROL SULFATE HFA 108 (90 BASE) MCG/ACT IN AERS
2.0000 | INHALATION_SPRAY | Freq: Four times a day (QID) | RESPIRATORY_TRACT | 3 refills | Status: DC | PRN
Start: 2023-03-13 — End: 2024-06-03

## 2023-03-13 MED ORDER — PREDNISONE 10 MG PO TABS
10.0000 mg | ORAL_TABLET | Freq: Every day | ORAL | 2 refills | Status: DC
Start: 2023-03-13 — End: 2023-06-05

## 2023-03-13 MED ORDER — AMLODIPINE BESYLATE 2.5 MG PO TABS: 2.5000 mg | ORAL_TABLET | Freq: Every day | ORAL | 1 refills | Status: DC

## 2023-03-13 MED ORDER — NORTRIPTYLINE HCL 50 MG PO CAPS
50.0000 mg | ORAL_CAPSULE | Freq: Every day | ORAL | 1 refills | Status: DC
Start: 2023-03-13 — End: 2023-09-25

## 2023-03-13 MED ORDER — METOCLOPRAMIDE HCL 5 MG PO TABS
5.0000 mg | ORAL_TABLET | Freq: Three times a day (TID) | ORAL | 3 refills | Status: DC
Start: 2023-03-13 — End: 2024-06-03

## 2023-03-13 NOTE — Progress Notes (Signed)
[facility] [address] [place], [place] [address]  Internal MEDICINE  Office Visit Note  Patient Name: Brittany Huynh  [id number]  [id number]  Date of Service: 03/13/2023  Chief Complaint  Patient presents with   Hypertension   Follow-up    Weakness in legs and sob    HPI Brittany Huynh presents for a follow-up visit for weakness in legs, SOB, OAB Bilateral lower extremity weakness -- 2 falls in the past 1.5 months  SOB -- occurs in the morning and esp with hot weather.  Stress and overflow incontinence/OAB -- not on any medication, sometimes cannot make it to bathroom. Wants to try medication    Current Medication: Outpatient Encounter Medications as of 03/13/2023  Medication Sig Note   acetaminophen (TYLENOL) 500 MG tablet Take 500 mg by mouth every 6 (six) hours as needed. 09/29/2020: Can take 1 or 2 at a time   bisoprolol-hydrochlorothiazide (ZIAC) 10-6.25 MG tablet TAKE 1 TABLET BY MOUTH EVERY DAY IN THE MORNING FOR BLOOD PRESSURE    dicyclomine (BENTYL) 20 MG tablet TAKE 1 TABLET (20 MG TOTAL) BY MOUTH 4 (FOUR) TIMES DAILY - BEFORE MEALS AND AT BEDTIME.    estradiol (ESTRACE) 0.5 MG tablet Take 1 tablet (0.5 mg total) by mouth daily.    [DISCONTINUED] albuterol (VENTOLIN HFA) 108 (90 Base) MCG/ACT inhaler Inhale 2 puffs into the lungs every 6 (six) hours as needed for wheezing or shortness of breath.    [DISCONTINUED] amLODipine (NORVASC) 2.5 MG tablet TAKE 1 TABLET BY MOUTH EVERY DAY    [DISCONTINUED] celecoxib (CELEBREX) 200 MG capsule Take 1 capsule (200 mg total) by mouth daily.    [DISCONTINUED] clonazePAM (KLONOPIN) 0.5 MG tablet Take 1 tablet (0.5 mg total) by mouth daily.    [DISCONTINUED] hydroxychloroquine (PLAQUENIL) 200 MG tablet Take 1 tablet (200 mg total) by mouth 2 (two) times daily.    [DISCONTINUED] metoCLOPramide (REGLAN) 5 MG tablet Take 1 tablet (5 mg total) by mouth 4 (four) times daily -  before meals and at bedtime. As needed only     [DISCONTINUED] mirtazapine (REMERON) 30 MG tablet Take 1 tablet (30 mg total) by mouth at bedtime.    [DISCONTINUED] nortriptyline (PAMELOR) 50 MG capsule Take 1 capsule (50 mg total) by mouth at bedtime.    [DISCONTINUED] predniSONE (DELTASONE) 10 MG tablet Take 1 tablet (10 mg total) by mouth daily with breakfast.    albuterol (VENTOLIN HFA) 108 (90 Base) MCG/ACT inhaler Inhale 2 puffs into the lungs every 6 (six) hours as needed for wheezing or shortness of breath.    amLODipine (NORVASC) 2.5 MG tablet Take 1 tablet (2.5 mg total) by mouth daily.    celecoxib (CELEBREX) 200 MG capsule Take 1 capsule (200 mg total) by mouth daily.    clonazePAM (KLONOPIN) 0.5 MG tablet Take 1 tablet (0.5 mg total) by mouth daily.    hydroxychloroquine (PLAQUENIL) 200 MG tablet Take 1 tablet (200 mg total) by mouth 2 (two) times daily.    metoCLOPramide (REGLAN) 5 MG tablet Take 1 tablet (5 mg total) by mouth 4 (four) times daily -  before meals and at bedtime. As needed only    mirtazapine (REMERON) 30 MG tablet Take 1 tablet (30 mg total) by mouth at bedtime.    nortriptyline (PAMELOR) 50 MG capsule Take 1 capsule (50 mg total) by mouth at bedtime.    predniSONE (DELTASONE) 10 MG tablet Take 1 tablet (10 mg total) by mouth daily with breakfast.    No  facility-administered encounter medications on file as of 03/13/2023.    Surgical History: Past Surgical History:  Procedure Laterality Date   ABDOMINAL HYSTERECTOMY  2009   BREAST BIOPSY Left 02/15/2018   PREDOMINANTLY MATURE ADIPOSE TISSUE WITH VERY FOCAL AREAS OF BENIGN    BREAST EXCISIONAL BIOPSY     chamberlain procedure   2011   COLONOSCOPY WITH PROPOFOL N/A 09/02/2020   Procedure: COLONOSCOPY WITH PROPOFOL;  Surgeon: Toney Reil, MD;  Location: [facility] ENDOSCOPY;  Service: Gastroenterology;  Laterality: N/A;   ESOPHAGOGASTRODUODENOSCOPY (EGD) WITH PROPOFOL N/A 09/02/2020   Procedure: ESOPHAGOGASTRODUODENOSCOPY (EGD) WITH PROPOFOL;  Surgeon: Toney Reil, MD;  Location: [facility] ENDOSCOPY;  Service: Gastroenterology;  Laterality: N/A;   IRRIGATION AND DEBRIDEMENT HEMATOMA Left 03/07/2018   Procedure: IRRIGATION AND DEBRIDEMENT HEMATOMA-LEFT BREAST;  Surgeon: Earline Mayotte, MD;  Location: [facility] ORS;  Service: General;  Laterality: Left;   LUNG BIOPSY  2011   RESECTION RIBS EXTRAPLEURAL  [id number]    Medical History: Past Medical History:  Diagnosis Date   Anemia    Anxiety    Asthma    Breast fibrocystic disorder 2013   Edema 09/15/2017   Hernia 2011   chest wall   Hypertension    Iron deficiency anemia 09/15/2017   Lung mass 2011   Sarcoidosis    Shortness of breath    2011   Stroke (HCC)    Tachycardia, unspecified 09/15/2017    Family History: Family History  Problem Relation Age of Onset   Liver cancer Mother 63   Bone cancer Maternal Aunt 60   Breast cancer Maternal Aunt    Breast cancer Paternal Aunt    Kidney cancer Father    Bone cancer Paternal Aunt 40    Social History   Socioeconomic History   Marital status: Divorced    Spouse name: Not on file   Number of children: Not on file   Years of education: Not on file   Highest education level: Not on file  Occupational History   Not on file  Tobacco Use   Smoking status: Never   Smokeless tobacco: Never  Vaping Use   Vaping status: Never Used  Substance and Sexual Activity   Alcohol use: Not Currently    Comment: on occassion   Drug use: No   Sexual activity: Not on file  Other Topics Concern   Not on file  Social History Narrative   Not on file   Social Determinants of Health   Financial Resource Strain: Not on file  Food Insecurity: Not on file  Transportation Needs: Not on file  Physical Activity: Not on file  Stress: Not on file  Social Connections: Not on file  Intimate Partner Violence: Not on file      Review of Systems  HENT:  Positive for postnasal drip, rhinorrhea and sneezing. Negative for congestion, sinus pressure,  sinus pain and sore throat.   Respiratory:  Positive for shortness of breath. Negative for cough, chest tightness and wheezing.   Cardiovascular: Negative.  Negative for chest pain and palpitations.  Gastrointestinal:  Positive for abdominal pain.  Musculoskeletal:  Positive for arthralgias (chronic knee pain), back pain, gait problem and myalgias.  Neurological:  Positive for weakness and numbness.    Vital Signs: BP 120/78   Pulse 85   Temp (!) 96.8 F (36 C)   Resp 16   Ht 5\' 6"  (1.676 m)   Wt 174 lb 6.4 oz (79.1 kg)   SpO2 97%  BMI 28.15 kg/m    Physical Exam Vitals reviewed.  Constitutional:      General: She is not in acute distress.    Appearance: Normal appearance.  HENT:     Head: Normocephalic and atraumatic.  Eyes:     Pupils: Pupils are equal, round, and reactive to light.  Cardiovascular:     Rate and Rhythm: Normal rate and regular rhythm.  Pulmonary:     Effort: Pulmonary effort is normal. No respiratory distress.  Musculoskeletal:     Right knee: Swelling and crepitus present. Decreased range of motion. Tenderness present.     Left knee: Swelling and crepitus present. Decreased range of motion. Tenderness present.  Neurological:     Mental Status: She is alert and oriented to person, place, and time.     Motor: Weakness present.  Psychiatric:        Mood and Affect: Mood normal.        Behavior: Behavior normal.        Assessment/Plan: 1. Sarcoidosis of lung (HCC) Continue albuterol inhaler and prednisone as prescribed.  - albuterol (VENTOLIN HFA) 108 (90 Base) MCG/ACT inhaler; Inhale 2 puffs into the lungs every 6 (six) hours as needed for wheezing or shortness of breath.  Dispense: 18 g; Refill: 3 - predniSONE (DELTASONE) 10 MG tablet; Take 1 tablet (10 mg total) by mouth daily with breakfast.  Dispense: 30 tablet; Refill: 2  2. Inflammatory polyarthritis (HCC) Continue celebrex and hydroxychloroquine as prescribed.  - celecoxib (CELEBREX)  200 MG capsule; Take 1 capsule (200 mg total) by mouth daily.  Dispense: 90 capsule; Refill: 3 - hydroxychloroquine (PLAQUENIL) 200 MG tablet; Take 1 tablet (200 mg total) by mouth 2 (two) times daily.  Dispense: 180 tablet; Refill: 1  3. 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  - oxybutynin (DITROPAN-XL) 10 MG 24 hr tablet; Take 1 tablet (10 mg total) by mouth at bedtime.  Dispense: 30 tablet; Refill: 2  4. Encounter for medication review Medication list reviewed, updated and refills ordered  - amLODipine (NORVASC) 2.5 MG tablet; Take 1 tablet (2.5 mg total) by mouth daily.  Dispense: 90 tablet; Refill: 1 - mirtazapine (REMERON) 30 MG tablet; Take 1 tablet (30 mg total) by mouth at bedtime.  Dispense: 30 tablet; Refill: 5 - metoCLOPramide (REGLAN) 5 MG tablet; Take 1 tablet (5 mg total) by mouth 4 (four) times daily -  before meals and at bedtime. As needed only  Dispense: 120 tablet; Refill: 3 - clonazePAM (KLONOPIN) 0.5 MG tablet; Take 1 tablet (0.5 mg total) by mouth daily.  Dispense: 90 tablet; Refill: 0 - nortriptyline (PAMELOR) 50 MG capsule; Take 1 capsule (50 mg total) by mouth at bedtime.  Dispense: 90 capsule; Refill: 1   General Counseling: Adalaya verbalizes understanding of the findings of todays visit and agrees with plan of treatment. I have discussed any further diagnostic evaluation that may be needed or ordered today. We also reviewed her medications today. she has been encouraged to call the office with any questions or concerns that should arise related to todays visit.    No orders of the defined types were placed in this encounter.   Meds ordered this encounter  Medications   albuterol (VENTOLIN HFA) 108 (90 Base) MCG/ACT inhaler    Sig: Inhale 2 puffs into the lungs every 6 (six) hours as needed for wheezing or shortness of breath.    Dispense:  18 g  Refill:  3   celecoxib (CELEBREX) 200  MG capsule    Sig: Take 1 capsule (200 mg total) by mouth daily.    Dispense:  90 capsule    Refill:  3   amLODipine (NORVASC) 2.5 MG tablet    Sig: Take 1 tablet (2.5 mg total) by mouth daily.    Dispense:  90 tablet    Refill:  1   mirtazapine (REMERON) 30 MG tablet    Sig: Take 1 tablet (30 mg total) by mouth at bedtime.    Dispense:  30 tablet    Refill:  5    Please fill ASAP and call patient when they are ready   metoCLOPramide (REGLAN) 5 MG tablet    Sig: Take 1 tablet (5 mg total) by mouth 4 (four) times daily -  before meals and at bedtime. As needed only    Dispense:  120 tablet    Refill:  3    Patient will call when she needs this med filled   hydroxychloroquine (PLAQUENIL) 200 MG tablet    Sig: Take 1 tablet (200 mg total) by mouth 2 (two) times daily.    Dispense:  180 tablet    Refill:  1   clonazePAM (KLONOPIN) 0.5 MG tablet    Sig: Take 1 tablet (0.5 mg total) by mouth daily.    Dispense:  90 tablet    Refill:  0   nortriptyline (PAMELOR) 50 MG capsule    Sig: Take 1 capsule (50 mg total) by mouth at bedtime.    Dispense:  90 capsule    Refill:  1    Patient will call when she needs this med filled   predniSONE (DELTASONE) 10 MG tablet    Sig: Take 1 tablet (10 mg total) by mouth daily with breakfast.    Dispense:  30 tablet    Refill:  2    Please fill ASAP and call patient when they are ready    Return in about 3 months (around 06/06/2023) for F/U, anxiety med refill, Nyree Applegate PCP.   Total time spent:30 Minutes Time spent includes review of chart, medications, test results, and follow up plan with the patient.   [place] Controlled Substance Database was reviewed by me.  This patient was seen by [name], FNP-C in collaboration with Dr. Beverely Risen as a part of collaborative care agreement.   Marleny Faller R. Tedd Sias, MSN, FNP-C Internal medicine

## 2023-04-26 ENCOUNTER — Other Ambulatory Visit: Payer: Self-pay | Admitting: Nurse Practitioner

## 2023-04-26 ENCOUNTER — Other Ambulatory Visit: Payer: Self-pay | Admitting: Internal Medicine

## 2023-04-26 DIAGNOSIS — N393 Stress incontinence (female) (male): Secondary | ICD-10-CM

## 2023-04-26 DIAGNOSIS — D860 Sarcoidosis of lung: Secondary | ICD-10-CM

## 2023-04-26 DIAGNOSIS — Z760 Encounter for issue of repeat prescription: Secondary | ICD-10-CM

## 2023-05-05 IMAGING — MG DIGITAL DIAGNOSTIC BILAT W/ TOMO W/ CAD
8 series · 8 of 24 positions shown · non-contrast
Comparison: Previous exam(s).

CLINICAL DATA: 55-year-old female with diffuse, bilateral breast
pain for 2 months.

EXAM:
DIGITAL DIAGNOSTIC BILATERAL MAMMOGRAM WITH TOMOSYNTHESIS AND CAD
TECHNIQUE: Bilateral digital diagnostic mammography and breast tomosynthesis
was performed. The images were evaluated with computer-aided
detection.

[L CC synth-2D]
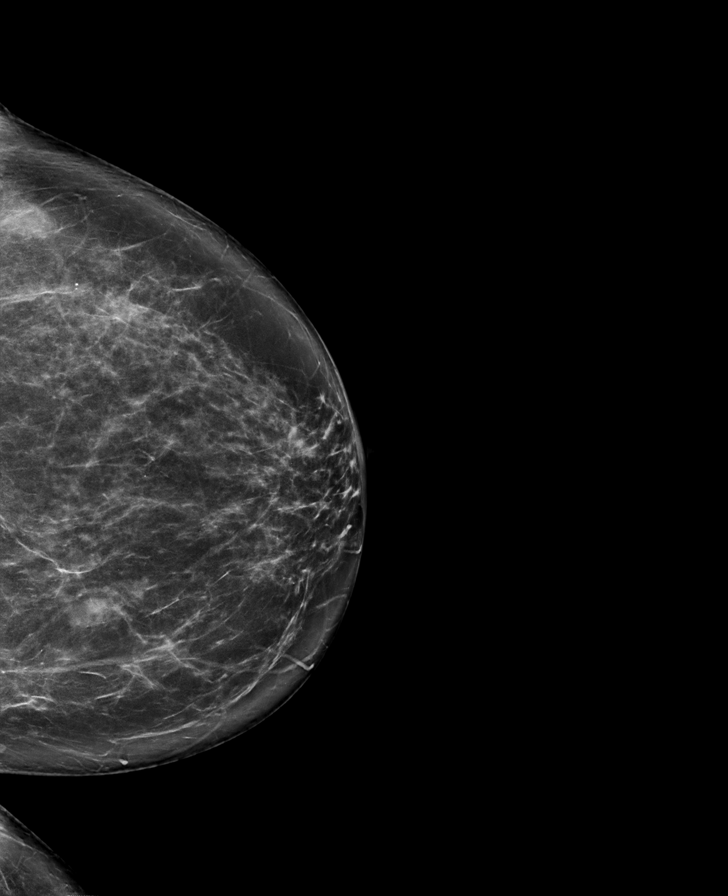

[L MLO synth-2D]
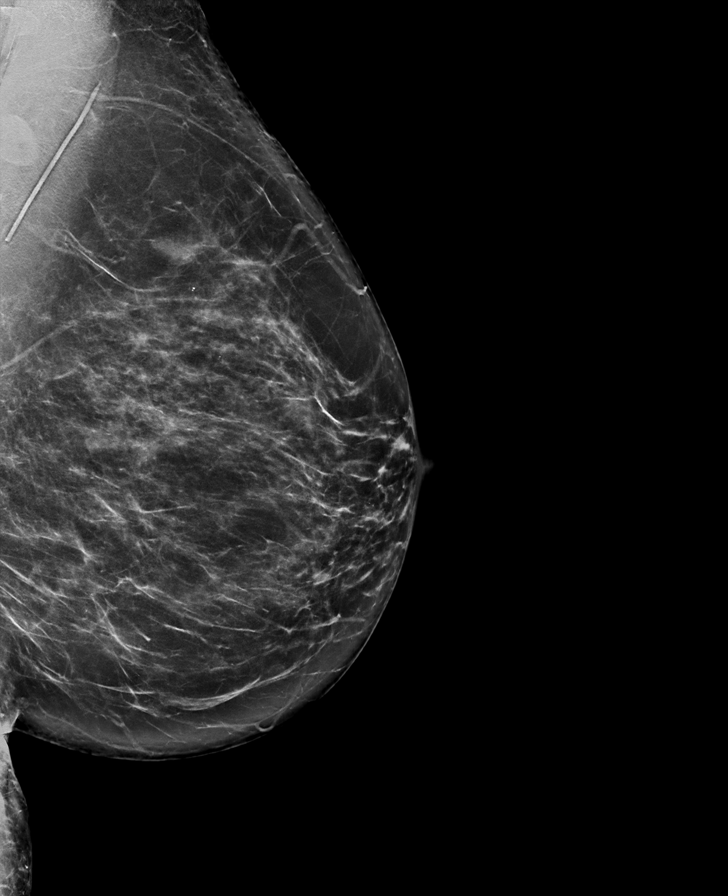

[R MLO synth-2D]
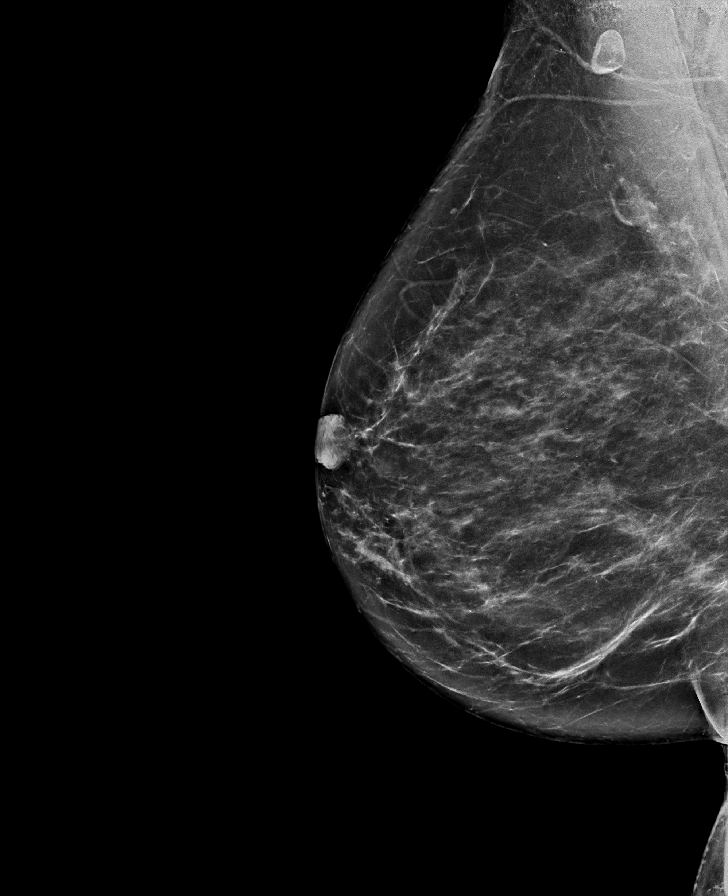

[R CC synth-2D]
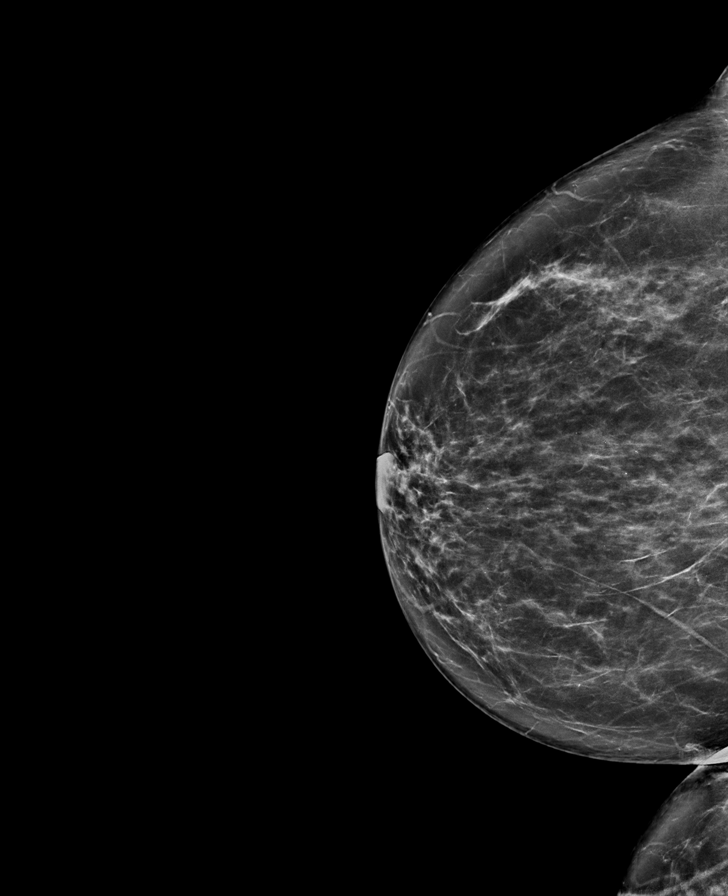

[L CC tomo · tomo slice 45/88.0]
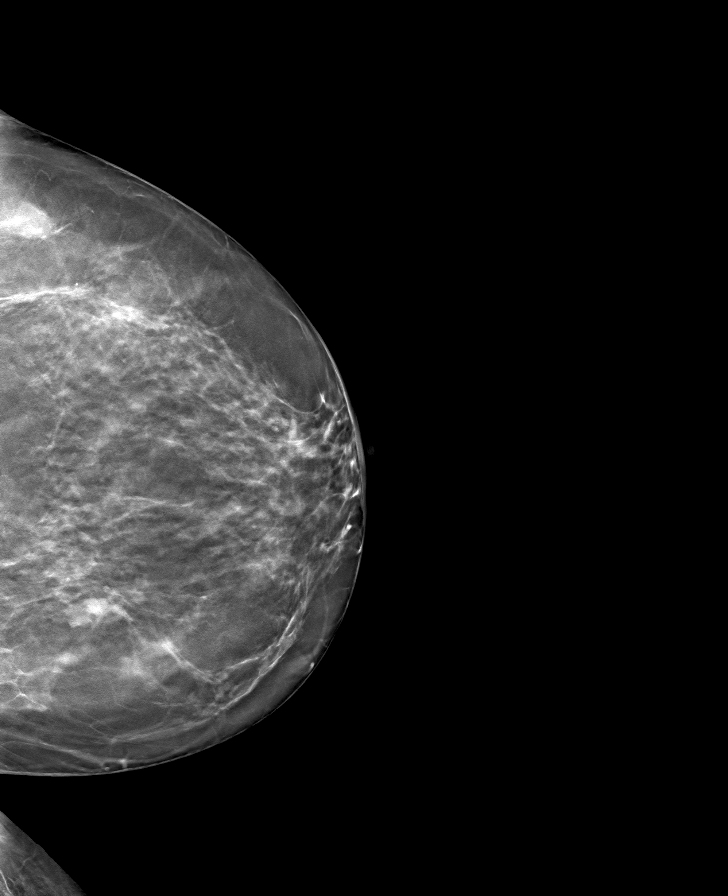

[R CC tomo · tomo slice 42/83.0]
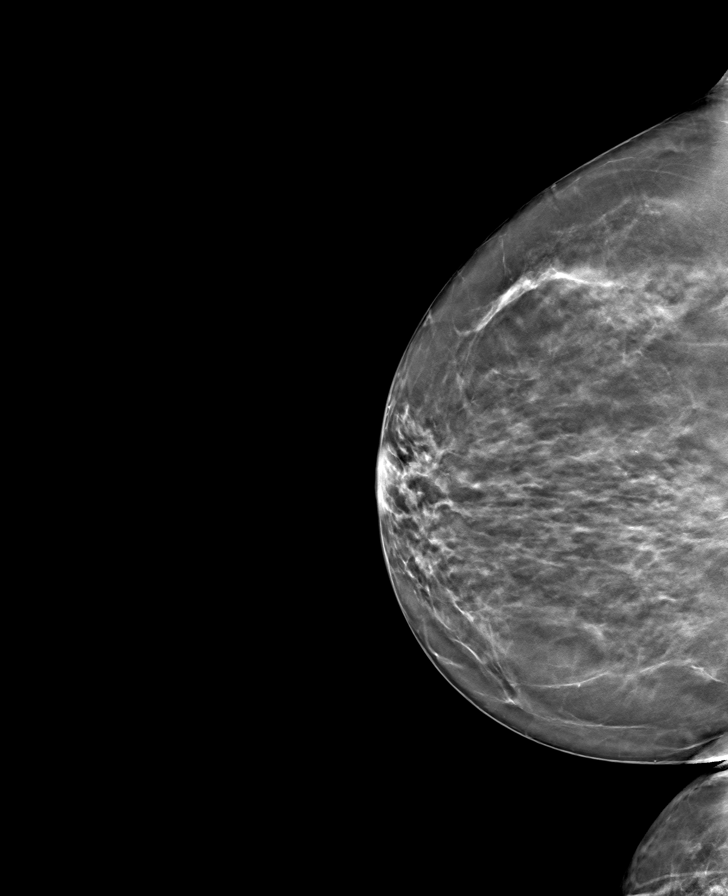

[L MLO tomo · tomo slice 42/83.0]
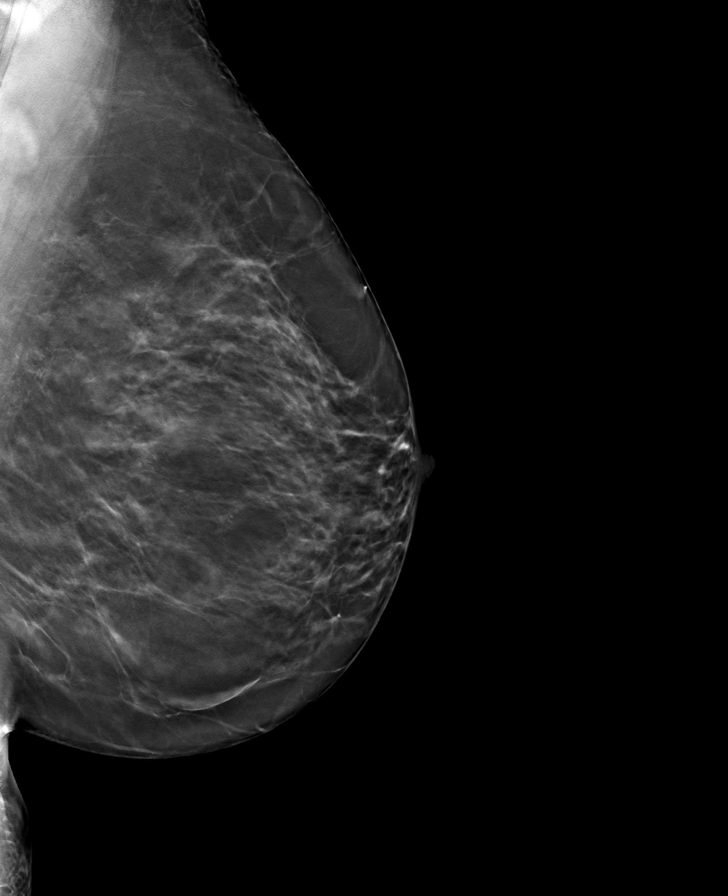

[R MLO tomo · tomo slice 44/87.0]
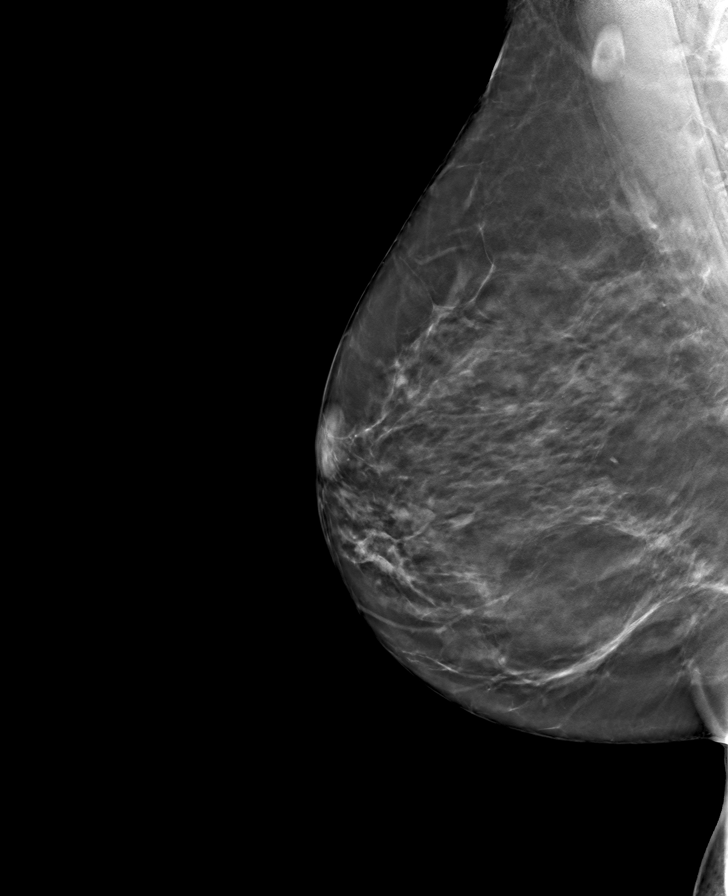

[8 of 24 positions shown; findings below may reference images not displayed]

ACR Breast Density Category c: The breast tissue is heterogeneously
dense, which may obscure small masses.
FINDINGS: No focal or suspicious mammographic findings are identified in
either breast. The parenchymal pattern is stable.
IMPRESSION: No mammographic evidence of malignancy in either breast.

RECOMMENDATION:
1. Clinical follow-up recommended for the painful area of concern in
the bilateral breasts. Any further workup should be based on
clinical grounds.
2.  Screening mammogram in one year.(Code:[id number]-A-LE0)

I have discussed the findings and recommendations with the patient.
If applicable, a reminder letter will be sent to the patient
regarding the next appointment.

BI-RADS CATEGORY  1: Negative.

## 2023-05-15 DIAGNOSIS — S93602A Unspecified sprain of left foot, initial encounter: Secondary | ICD-10-CM | POA: Diagnosis not present

## 2023-05-15 DIAGNOSIS — S99922A Unspecified injury of left foot, initial encounter: Secondary | ICD-10-CM | POA: Diagnosis not present

## 2023-06-05 ENCOUNTER — Encounter: Payer: Self-pay | Admitting: Nurse Practitioner

## 2023-06-05 ENCOUNTER — Telehealth: Payer: Self-pay | Admitting: Nurse Practitioner

## 2023-06-05 ENCOUNTER — Ambulatory Visit: Payer: BC Managed Care – PPO | Admitting: Nurse Practitioner

## 2023-06-05 VITALS — BP 122/80 | HR 78 | Temp 97.3°F | Resp 16 | Ht 66.0 in | Wt 174.8 lb

## 2023-06-05 DIAGNOSIS — M064 Inflammatory polyarthropathy: Secondary | ICD-10-CM

## 2023-06-05 DIAGNOSIS — D860 Sarcoidosis of lung: Secondary | ICD-10-CM | POA: Diagnosis not present

## 2023-06-05 DIAGNOSIS — N393 Stress incontinence (female) (male): Secondary | ICD-10-CM | POA: Diagnosis not present

## 2023-06-05 DIAGNOSIS — E2839 Other primary ovarian failure: Secondary | ICD-10-CM

## 2023-06-05 DIAGNOSIS — N39490 Overflow incontinence: Secondary | ICD-10-CM

## 2023-06-05 DIAGNOSIS — M7750 Other enthesopathy of unspecified foot: Secondary | ICD-10-CM | POA: Diagnosis not present

## 2023-06-05 DIAGNOSIS — Z79899 Other long term (current) drug therapy: Secondary | ICD-10-CM

## 2023-06-05 DIAGNOSIS — E8941 Symptomatic postprocedural ovarian failure: Secondary | ICD-10-CM

## 2023-06-05 DIAGNOSIS — K117 Disturbances of salivary secretion: Secondary | ICD-10-CM

## 2023-06-05 MED ORDER — MIRTAZAPINE 30 MG PO TABS
30.0000 mg | ORAL_TABLET | Freq: Every day | ORAL | 5 refills | Status: DC
Start: 2023-06-05 — End: 2023-09-04

## 2023-06-05 MED ORDER — CLONAZEPAM 0.5 MG PO TABS
0.5000 mg | ORAL_TABLET | Freq: Every day | ORAL | 0 refills | Status: DC
Start: 2023-06-05 — End: 2023-09-04

## 2023-06-05 MED ORDER — PREDNISONE 10 MG PO TABS
10.0000 mg | ORAL_TABLET | Freq: Every day | ORAL | 2 refills | Status: DC
Start: 2023-06-05 — End: 2023-09-04

## 2023-06-05 MED ORDER — OXYBUTYNIN CHLORIDE ER 15 MG PO TB24
15.0000 mg | ORAL_TABLET | Freq: Every day | ORAL | 2 refills | Status: DC
Start: 2023-06-05 — End: 2023-09-04

## 2023-06-05 MED ORDER — ESTRADIOL 0.5 MG PO TABS
0.5000 mg | ORAL_TABLET | Freq: Every day | ORAL | 1 refills | Status: DC
Start: 2023-06-05 — End: 2023-09-04

## 2023-06-05 NOTE — Telephone Encounter (Signed)
Notified patient of dexa appointment date, arrival time, location-[place]

## 2023-06-05 NOTE — Progress Notes (Signed)
[facility] [address] [place], [place] [address]  Internal MEDICINE  Office Visit Note  Patient Name: Brittany Huynh  [id number]  [id number]  Date of Service: 06/05/2023  Chief Complaint  Patient presents with   Hypertension   Follow-up    HPI Brittany Huynh presents for a follow-up visit for overactive bladder, bone spur on foot and recent fall.  Overactive bladder -- oxybutynin is helping but not enough, wants to increase dose Skin hyperpigmentation on legs -- unknown cause, thought to be bruising. Right leg pigment change has been there for 2 years. Left leg is new and possibly a bruise.  Bone spur causing pain in left foot, wearing short orthopedic boot --this was applid by urgent care at a recent visit  Recent fall -- causing bruising of left leg, happened over a month ago.     Current Medication: Outpatient Encounter Medications as of 06/05/2023  Medication Sig Note   acetaminophen (TYLENOL) 500 MG tablet Take 500 mg by mouth every 6 (six) hours as needed. 09/29/2020: Can take 1 or 2 at a time   albuterol (VENTOLIN HFA) 108 (90 Base) MCG/ACT inhaler Inhale 2 puffs into the lungs every 6 (six) hours as needed for wheezing or shortness of breath.    amLODipine (NORVASC) 2.5 MG tablet Take 1 tablet (2.5 mg total) by mouth daily.    bisoprolol-hydrochlorothiazide (ZIAC) 10-6.25 MG tablet TAKE 1 TABLET BY MOUTH EVERY DAY IN THE MORNING FOR BLOOD PRESSURE    celecoxib (CELEBREX) 200 MG capsule Take 1 capsule (200 mg total) by mouth daily.    dicyclomine (BENTYL) 20 MG tablet TAKE 1 TABLET (20 MG TOTAL) BY MOUTH 4 (FOUR) TIMES DAILY - BEFORE MEALS AND AT BEDTIME.    hydroxychloroquine (PLAQUENIL) 200 MG tablet Take 1 tablet (200 mg total) by mouth 2 (two) times daily.    metoCLOPramide (REGLAN) 5 MG tablet Take 1 tablet (5 mg total) by mouth 4 (four) times daily -  before meals and at bedtime. As needed only    nortriptyline (PAMELOR) 50 MG capsule Take 1 capsule (50 mg total)  by mouth at bedtime.    oxybutynin (DITROPAN XL) 15 MG 24 hr tablet Take 1 tablet (15 mg total) by mouth at bedtime.    [DISCONTINUED] clonazePAM (KLONOPIN) 0.5 MG tablet Take 1 tablet (0.5 mg total) by mouth daily.    [DISCONTINUED] estradiol (ESTRACE) 0.5 MG tablet Take 1 tablet (0.5 mg total) by mouth daily.    [DISCONTINUED] mirtazapine (REMERON) 30 MG tablet Take 1 tablet (30 mg total) by mouth at bedtime.    [DISCONTINUED] oxybutynin (DITROPAN-XL) 10 MG 24 hr tablet TAKE 1 TABLET BY MOUTH EVERYDAY AT BEDTIME    [DISCONTINUED] predniSONE (DELTASONE) 10 MG tablet Take 1 tablet (10 mg total) by mouth daily with breakfast.    clonazePAM (KLONOPIN) 0.5 MG tablet Take 1 tablet (0.5 mg total) by mouth daily.    estradiol (ESTRACE) 0.5 MG tablet Take 1 tablet (0.5 mg total) by mouth daily.    mirtazapine (REMERON) 30 MG tablet Take 1 tablet (30 mg total) by mouth at bedtime.    predniSONE (DELTASONE) 10 MG tablet Take 1 tablet (10 mg total) by mouth daily with breakfast.    No facility-administered encounter medications on file as of 06/05/2023.    Surgical History: Past Surgical History:  Procedure Laterality Date   ABDOMINAL HYSTERECTOMY  2009   BREAST BIOPSY Left 02/15/2018   PREDOMINANTLY MATURE ADIPOSE TISSUE WITH VERY FOCAL AREAS OF BENIGN  BREAST EXCISIONAL BIOPSY     chamberlain procedure   2011   COLONOSCOPY WITH PROPOFOL N/A 09/02/2020   Procedure: COLONOSCOPY WITH PROPOFOL;  Surgeon: Toney Reil, MD;  Location: [facility] ENDOSCOPY;  Service: Gastroenterology;  Laterality: N/A;   ESOPHAGOGASTRODUODENOSCOPY (EGD) WITH PROPOFOL N/A 09/02/2020   Procedure: ESOPHAGOGASTRODUODENOSCOPY (EGD) WITH PROPOFOL;  Surgeon: Toney Reil, MD;  Location: [facility] ENDOSCOPY;  Service: Gastroenterology;  Laterality: N/A;   IRRIGATION AND DEBRIDEMENT HEMATOMA Left 03/07/2018   Procedure: IRRIGATION AND DEBRIDEMENT HEMATOMA-LEFT BREAST;  Surgeon: Earline Mayotte, MD;  Location: [facility] ORS;   Service: General;  Laterality: Left;   LUNG BIOPSY  2011   RESECTION RIBS EXTRAPLEURAL  14782    Medical History: Past Medical History:  Diagnosis Date   Anemia    Anxiety    Asthma    Breast fibrocystic disorder 2013   Edema 09/15/2017   Hernia 2011   chest wall   Hypertension    Iron deficiency anemia 09/15/2017   Lung mass 2011   Sarcoidosis    Shortness of breath    2011   Stroke (HCC)    Tachycardia, unspecified 09/15/2017    Family History: Family History  Problem Relation Age of Onset   Liver cancer Mother 13   Bone cancer Maternal Aunt 60   Breast cancer Maternal Aunt    Breast cancer Paternal Aunt    Kidney cancer Father    Bone cancer Paternal Aunt 72    Social History   Socioeconomic History   Marital status: Divorced    Spouse name: Not on file   Number of children: Not on file   Years of education: Not on file   Highest education level: Not on file  Occupational History   Not on file  Tobacco Use   Smoking status: Never   Smokeless tobacco: Never  Vaping Use   Vaping status: Never Used  Substance and Sexual Activity   Alcohol use: Not Currently    Comment: on occassion   Drug use: No   Sexual activity: Not on file  Other Topics Concern   Not on file  Social History Narrative   Not on file   Social Determinants of Health   Financial Resource Strain: Not on file  Food Insecurity: Not on file  Transportation Needs: Not on file  Physical Activity: Not on file  Stress: Not on file  Social Connections: Not on file  Intimate Partner Violence: Not on file      Review of Systems  HENT:  Positive for postnasal drip, rhinorrhea and sneezing. Negative for congestion, sinus pressure, sinus pain and sore throat.   Respiratory:  Positive for shortness of breath. Negative for cough, chest tightness and wheezing.   Cardiovascular: Negative.  Negative for chest pain and palpitations.  Gastrointestinal:  Positive for abdominal pain.   Musculoskeletal:  Positive for arthralgias (chronic knee pain), back pain, gait problem and myalgias.  Neurological:  Positive for weakness and numbness.    Vital Signs: BP 122/80   Pulse 78   Temp (!) 97.3 F (36.3 C)   Resp 16   Ht 5\' 6"  (1.676 m)   Wt 174 lb 12.8 oz (79.3 kg)   SpO2 97%   BMI 28.21 kg/m    Physical Exam Vitals reviewed.  Constitutional:      General: She is not in acute distress.    Appearance: Normal appearance.  HENT:     Head: Normocephalic and atraumatic.  Eyes:  Pupils: Pupils are equal, round, and reactive to light.  Cardiovascular:     Rate and Rhythm: Normal rate and regular rhythm.  Pulmonary:     Effort: Pulmonary effort is normal. No respiratory distress.  Musculoskeletal:     Right knee: Swelling and crepitus present. Decreased range of motion. Tenderness present.     Left knee: Swelling and crepitus present. Decreased range of motion. Tenderness present.  Neurological:     Mental Status: She is alert and oriented to person, place, and time.     Motor: Weakness present.  Psychiatric:        Mood and Affect: Mood normal.        Behavior: Behavior normal.      Assessment/Plan: 1. Bone spur of foot Referred to podiatry - Ambulatory referral to Podiatry  2. Ovarian failure due to menopause Repeat dexa scan ordered  - DG Bone Density; Future  3. Sarcoidosis of lung (HCC) Continue prednisone as prescribed.  - predniSONE (DELTASONE) 10 MG tablet; Take 1 tablet (10 mg total) by mouth daily with breakfast.  Dispense: 30 tablet; Refill: 2  4. 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  - oxybutynin (DITROPAN XL) 15 MG 24 hr tablet; Take 1 tablet (15 mg total) by mouth at bedtime.  Dispense: 30 tablet; Refill: 2  5. Inflammatory polyarthritis (HCC) Continue medications as prescribed, also followed by rheumatology  6. Hot flashes due to surgical  menopause Continue estradiol as prescribed.  - estradiol (ESTRACE) 0.5 MG tablet; Take 1 tablet (0.5 mg total) by mouth daily.  Dispense: 90 tablet; Refill: 1  7. Xerostomia Discussed OTC interventions to try   8. Encounter for medication review Medication list reviewed, updated and refills ordered  - mirtazapine (REMERON) 30 MG tablet; Take 1 tablet (30 mg total) by mouth at bedtime.  Dispense: 30 tablet; Refill: 5 - clonazePAM (KLONOPIN) 0.5 MG tablet; Take 1 tablet (0.5 mg total) by mouth daily.  Dispense: 90 tablet; Refill: 0   General Counseling: Calliana verbalizes understanding of the findings of todays visit and agrees with plan of treatment. I have discussed any further diagnostic evaluation that may be needed or ordered today. We also reviewed her medications today. she has been encouraged to call the office with any questions or concerns that should arise related to todays visit.    Orders Placed This Encounter  Procedures   DG Bone Density   Ambulatory referral to Podiatry    Meds ordered this encounter  Medications   oxybutynin (DITROPAN XL) 15 MG 24 hr tablet    Sig: Take 1 tablet (15 mg total) by mouth at bedtime.    Dispense:  30 tablet    Refill:  2    Discontinue 10 mg tablet and fill new script today   mirtazapine (REMERON) 30 MG tablet    Sig: Take 1 tablet (30 mg total) by mouth at bedtime.    Dispense:  30 tablet    Refill:  5    Please fill ASAP and call patient when they are ready   predniSONE (DELTASONE) 10 MG tablet    Sig: Take 1 tablet (10 mg total) by mouth daily with breakfast.    Dispense:  30 tablet    Refill:  2    Please fill ASAP and call patient when they are ready   estradiol (ESTRACE) 0.5 MG tablet    Sig: Take 1 tablet (0.5 mg total) by mouth  daily.    Dispense:  90 tablet    Refill:  1   clonazePAM (KLONOPIN) 0.5 MG tablet    Sig: Take 1 tablet (0.5 mg total) by mouth daily.    Dispense:  90 tablet    Refill:  0    Return in about  3 months (around 08/31/2023) for F/U, anxiety med refill, Stephania Macfarlane PCP.   Total time spent:30 Minutes Time spent includes review of chart, medications, test results, and follow up plan with the patient.   [place] Controlled Substance Database was reviewed by me.  This patient was seen by [name], FNP-C in collaboration with Dr. Beverely Risen as a part of collaborative care agreement.   Yoshito Gaza R. Tedd Sias, MSN, FNP-C Internal medicine

## 2023-06-06 ENCOUNTER — Encounter: Payer: Self-pay | Admitting: Nurse Practitioner

## 2023-06-06 DIAGNOSIS — N393 Stress incontinence (female) (male): Secondary | ICD-10-CM | POA: Insufficient documentation

## 2023-06-06 DIAGNOSIS — M7750 Other enthesopathy of unspecified foot: Secondary | ICD-10-CM | POA: Insufficient documentation

## 2023-06-06 DIAGNOSIS — E2839 Other primary ovarian failure: Secondary | ICD-10-CM | POA: Insufficient documentation

## 2023-06-10 ENCOUNTER — Other Ambulatory Visit: Payer: Self-pay | Admitting: Internal Medicine

## 2023-06-10 DIAGNOSIS — Z79899 Other long term (current) drug therapy: Secondary | ICD-10-CM

## 2023-06-12 NOTE — Telephone Encounter (Signed)
Please review and send 

## 2023-07-03 ENCOUNTER — Ambulatory Visit: Payer: Self-pay | Admitting: Podiatry

## 2023-08-21 ENCOUNTER — Ambulatory Visit
Admission: RE | Admit: 2023-08-21 | Discharge: 2023-08-21 | Disposition: A | Discharge status: Home / Self Care | Payer: BC Managed Care – PPO | Source: Ambulatory Visit | Attending: Nurse Practitioner | Admitting: Nurse Practitioner

## 2023-08-21 DIAGNOSIS — E2839 Other primary ovarian failure: Secondary | ICD-10-CM | POA: Diagnosis not present

## 2023-08-21 DIAGNOSIS — Z780 Asymptomatic menopausal state: Secondary | ICD-10-CM | POA: Diagnosis not present

## 2023-09-04 ENCOUNTER — Ambulatory Visit: Payer: BC Managed Care – PPO | Admitting: Nurse Practitioner

## 2023-09-04 ENCOUNTER — Encounter: Payer: Self-pay | Admitting: Nurse Practitioner

## 2023-09-04 VITALS — BP 118/78 | HR 95 | Temp 98.1°F | Resp 16 | Ht 66.0 in | Wt 165.4 lb

## 2023-09-04 DIAGNOSIS — K117 Disturbances of salivary secretion: Secondary | ICD-10-CM

## 2023-09-04 DIAGNOSIS — D860 Sarcoidosis of lung: Secondary | ICD-10-CM

## 2023-09-04 DIAGNOSIS — N393 Stress incontinence (female) (male): Secondary | ICD-10-CM

## 2023-09-04 DIAGNOSIS — N39490 Overflow incontinence: Secondary | ICD-10-CM

## 2023-09-04 DIAGNOSIS — J0100 Acute maxillary sinusitis, unspecified: Secondary | ICD-10-CM

## 2023-09-04 DIAGNOSIS — Z79899 Other long term (current) drug therapy: Secondary | ICD-10-CM

## 2023-09-04 DIAGNOSIS — R29898 Other symptoms and signs involving the musculoskeletal system: Secondary | ICD-10-CM | POA: Diagnosis not present

## 2023-09-04 MED ORDER — AZITHROMYCIN 250 MG PO TABS: ORAL_TABLET | ORAL | 0 refills | Status: AC

## 2023-09-04 MED ORDER — BISOPROLOL-HYDROCHLOROTHIAZIDE 10-6.25 MG PO TABS: ORAL_TABLET | ORAL | 1 refills | Status: DC

## 2023-09-04 MED ORDER — CLONAZEPAM 0.5 MG PO TABS: 0.5000 mg | ORAL_TABLET | Freq: Every day | ORAL | 0 refills | Status: DC

## 2023-09-04 MED ORDER — AMLODIPINE BESYLATE 2.5 MG PO TABS: 2.5000 mg | ORAL_TABLET | Freq: Every day | ORAL | 1 refills | Status: DC

## 2023-09-04 MED ORDER — PREDNISONE 10 MG PO TABS: 10.0000 mg | ORAL_TABLET | Freq: Every day | ORAL | 2 refills | Status: DC

## 2023-09-04 MED ORDER — ESTRADIOL 0.5 MG PO TABS: 0.5000 mg | ORAL_TABLET | Freq: Every day | ORAL | 1 refills | Status: DC

## 2023-09-04 MED ORDER — OXYBUTYNIN CHLORIDE ER 10 MG PO TB24
10.0000 mg | ORAL_TABLET | Freq: Every day | ORAL | 5 refills | Status: DC
Start: 2023-09-04 — End: 2023-12-11

## 2023-09-04 NOTE — Progress Notes (Signed)
 [facility] [address] [place], [place] [address]  Internal MEDICINE  Office Visit Note  Patient Name: Brittany Huynh  [id number]  [id number]  Date of Service: 09/04/2023  Chief Complaint  Patient presents with   Hypertension   Follow-up    HPI Nishika presents for a follow-up visit for dry mouth, increased weakness of her legs, and BMD scan results.  Dry mouth -- significantly worse since increasing oxybutynin  in October.  Leg weakness -- fell recently, feels like the weakness has become worse. Possible side effects of oxybutynin .  Dexa scan -- bone density is normal.  Due for multiple refills of medications Report sinus pressure, sore throat, sinus drainage that is green, nasal congestion. Has been going on for several weeks.    Current Medication: Outpatient Encounter Medications as of 09/04/2023  Medication Sig Note   acetaminophen  (TYLENOL ) 500 MG tablet Take 500 mg by mouth every 6 (six) hours as needed. 09/29/2020: Can take 1 or 2 at a time   albuterol  (VENTOLIN  HFA) 108 (90 Base) MCG/ACT inhaler Inhale 2 puffs into the lungs every 6 (six) hours as needed for wheezing or shortness of breath.    azithromycin  (ZITHROMAX ) 250 MG tablet Take 2 tablets on day 1, then 1 tablet daily on days 2 through 5    busPIRone  (BUSPAR ) 7.5 MG tablet TAKE BUSPAR  7.5 MG NIGHTLY FOR 1 WEEK, THEN INCREASE TO 7.5 MG TWICE DAILY AND CONTINUE THIS DOSE.    celecoxib  (CELEBREX ) 200 MG capsule Take 1 capsule (200 mg total) by mouth daily.    hydroxychloroquine  (PLAQUENIL ) 200 MG tablet Take 1 tablet (200 mg total) by mouth 2 (two) times daily.    metoCLOPramide  (REGLAN ) 5 MG tablet Take 1 tablet (5 mg total) by mouth 4 (four) times daily -  before meals and at bedtime. As needed only    nortriptyline  (PAMELOR ) 50 MG capsule Take 1 capsule (50 mg total) by mouth at bedtime.    oxybutynin  (DITROPAN -XL) 10 MG 24 hr tablet Take 1 tablet (10 mg total) by mouth at bedtime.    QUEtiapine  (SEROQUEL) 25 MG tablet Take 3 tablets by mouth at bedtime.    [DISCONTINUED] amLODipine  (NORVASC ) 2.5 MG tablet Take 1 tablet (2.5 mg total) by mouth daily.    [DISCONTINUED] bisoprolol -hydrochlorothiazide  (ZIAC ) 10-6.25 MG tablet TAKE 1 TABLET BY MOUTH EVERY DAY IN THE MORNING FOR BLOOD PRESSURE    [DISCONTINUED] clonazePAM  (KLONOPIN ) 0.5 MG tablet Take 1 tablet (0.5 mg total) by mouth daily.    [DISCONTINUED] dicyclomine  (BENTYL ) 20 MG tablet TAKE 1 TABLET (20 MG TOTAL) BY MOUTH 4 (FOUR) TIMES DAILY - BEFORE MEALS AND AT BEDTIME.    [DISCONTINUED] estradiol  (ESTRACE ) 0.5 MG tablet Take 1 tablet (0.5 mg total) by mouth daily.    [DISCONTINUED] mirtazapine  (REMERON ) 30 MG tablet Take 1 tablet (30 mg total) by mouth at bedtime.    [DISCONTINUED] oxybutynin  (DITROPAN  XL) 15 MG 24 hr tablet Take 1 tablet (15 mg total) by mouth at bedtime.    [DISCONTINUED] predniSONE  (DELTASONE ) 10 MG tablet Take 1 tablet (10 mg total) by mouth daily with breakfast.    amLODipine  (NORVASC ) 2.5 MG tablet Take 1 tablet (2.5 mg total) by mouth daily.    bisoprolol -hydrochlorothiazide  (ZIAC ) 10-6.25 MG tablet TAKE 1 TABLET BY MOUTH EVERY DAY IN THE MORNING FOR BLOOD PRESSURE    clonazePAM  (KLONOPIN ) 0.5 MG tablet Take 1 tablet (0.5 mg total) by mouth daily.    DULoxetine  (CYMBALTA ) 60 MG capsule Take by  mouth.    estradiol  (ESTRACE ) 0.5 MG tablet Take 1 tablet (0.5 mg total) by mouth daily.    predniSONE  (DELTASONE ) 10 MG tablet Take 1 tablet (10 mg total) by mouth daily with breakfast.    No facility-administered encounter medications on file as of 09/04/2023.    Surgical History: Past Surgical History:  Procedure Laterality Date   ABDOMINAL HYSTERECTOMY  2009   BREAST BIOPSY Left 02/15/2018   PREDOMINANTLY MATURE ADIPOSE TISSUE WITH VERY FOCAL AREAS OF BENIGN    BREAST EXCISIONAL BIOPSY     chamberlain procedure   2011   COLONOSCOPY WITH PROPOFOL  N/A 09/02/2020   Procedure: COLONOSCOPY WITH PROPOFOL ;  Surgeon:  Unk Corinn Skiff, MD;  Location: [facility] ENDOSCOPY;  Service: Gastroenterology;  Laterality: N/A;   ESOPHAGOGASTRODUODENOSCOPY (EGD) WITH PROPOFOL  N/A 09/02/2020   Procedure: ESOPHAGOGASTRODUODENOSCOPY (EGD) WITH PROPOFOL ;  Surgeon: Unk Corinn Skiff, MD;  Location: [facility] ENDOSCOPY;  Service: Gastroenterology;  Laterality: N/A;   IRRIGATION AND DEBRIDEMENT HEMATOMA Left 03/07/2018   Procedure: IRRIGATION AND DEBRIDEMENT HEMATOMA-LEFT BREAST;  Surgeon: Dessa Reyes ORN, MD;  Location: [facility] ORS;  Service: General;  Laterality: Left;   LUNG BIOPSY  2011   RESECTION RIBS EXTRAPLEURAL  [id number]    Medical History: Past Medical History:  Diagnosis Date   Anemia    Anxiety    Asthma    Breast fibrocystic disorder 2013   Edema 09/15/2017   Hernia 2011   chest wall   Hypertension    Iron deficiency anemia 09/15/2017   Lung mass 2011   Sarcoidosis    Shortness of breath    2011   Stroke (HCC)    Tachycardia, unspecified 09/15/2017    Family History: Family History  Problem Relation Age of Onset   Liver cancer Mother 89   Bone cancer Maternal Aunt 60   Breast cancer Maternal Aunt    Breast cancer Paternal Aunt    Kidney cancer Father    Bone cancer Paternal Aunt 64    Social History   Socioeconomic History   Marital status: Divorced    Spouse name: Not on file   Number of children: Not on file   Years of education: Not on file   Highest education level: Not on file  Occupational History   Not on file  Tobacco Use   Smoking status: Never   Smokeless tobacco: Never  Vaping Use   Vaping status: Never Used  Substance and Sexual Activity   Alcohol use: Not Currently    Comment: on occassion   Drug use: No   Sexual activity: Not on file  Other Topics Concern   Not on file  Social History Narrative   Not on file   Social Drivers of Health   Financial Resource Strain: Not on file  Food Insecurity: Not on file  Transportation Needs: Not on file  Physical Activity: Not  on file  Stress: Not on file  Social Connections: Not on file  Intimate Partner Violence: Not on file      Review of Systems  Constitutional:  Positive for fatigue. Negative for chills and fever.  HENT:  Positive for congestion, postnasal drip, rhinorrhea, sinus pressure, sinus pain and sore throat.   Respiratory: Negative.  Negative for cough, chest tightness, shortness of breath and wheezing.   Cardiovascular: Negative.  Negative for chest pain and palpitations.  Gastrointestinal:        Dry mouth worsening  Musculoskeletal:  Positive for myalgias.  Neurological:  Positive for weakness (legs bilaterally) and  headaches.    Vital Signs: BP 118/78   Pulse 95   Temp 98.1 F (36.7 C)   Resp 16   Ht 5' 6 (1.676 m)   Wt 165 lb 6.4 oz (75 kg)   SpO2 97%   BMI 26.70 kg/m    Physical Exam Vitals reviewed.  Constitutional:      Appearance: Normal appearance.  HENT:     Head: Normocephalic and atraumatic.  Eyes:     Pupils: Pupils are equal, round, and reactive to light.  Cardiovascular:     Rate and Rhythm: Normal rate and regular rhythm.  Pulmonary:     Effort: Pulmonary effort is normal. No respiratory distress.  Musculoskeletal:     Right hip: Decreased strength.     Left hip: Decreased strength.  Neurological:     Mental Status: She is alert and oriented to person, place, and time.  Psychiatric:        Mood and Affect: Mood normal.        Behavior: Behavior normal.        Assessment/Plan: 1. Xerostomia (Primary) Oxybutynin  dose decreased, will see if dry mouth improves with lower dose.   2. Leg weakness, bilateral Oxybutynin  dose decreased, will see if leg weakness improves with lower dose  3. Acute non-recurrent maxillary sinusitis Zpak prescribed, take until gone  - azithromycin  (ZITHROMAX ) 250 MG tablet; Take 2 tablets on day 1, then 1 tablet daily on days 2 through 5  Dispense: 6 tablet; Refill: 0  4. 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 Will reassess as needed  - oxybutynin  (DITROPAN -XL) 10 MG 24 hr tablet; Take 1 tablet (10 mg total) by mouth at bedtime.  Dispense: 30 tablet; Refill: 5  5. Sarcoidosis of lung (HCC) Continue prednisone  as prescribed  - predniSONE  (DELTASONE ) 10 MG tablet; Take 1 tablet (10 mg total) by mouth daily with breakfast.  Dispense: 30 tablet; Refill: 2  6. Encounter for medication review Medication list reviewed, updated with changes from other specialist providers, refills ordered  - busPIRone  (BUSPAR ) 7.5 MG tablet; TAKE BUSPAR  7.5 MG NIGHTLY FOR 1 WEEK, THEN INCREASE TO 7.5 MG TWICE DAILY AND CONTINUE THIS DOSE. - DULoxetine  (CYMBALTA ) 60 MG capsule; Take by mouth. - QUEtiapine (SEROQUEL) 25 MG tablet; Take 3 tablets by mouth at bedtime. - clonazePAM  (KLONOPIN ) 0.5 MG tablet; Take 1 tablet (0.5 mg total) by mouth daily.  Dispense: 90 tablet; Refill: 0 - estradiol  (ESTRACE ) 0.5 MG tablet; Take 1 tablet (0.5 mg total) by mouth daily.  Dispense: 90 tablet; Refill: 1 - bisoprolol -hydrochlorothiazide  (ZIAC ) 10-6.25 MG tablet; TAKE 1 TABLET BY MOUTH EVERY DAY IN THE MORNING FOR BLOOD PRESSURE  Dispense: 90 tablet; Refill: 1 - amLODipine  (NORVASC ) 2.5 MG tablet; Take 1 tablet (2.5 mg total) by mouth daily.  Dispense: 90 tablet; Refill: 1   General Counseling: Zykerria verbalizes understanding of the findings of todays visit and agrees with plan of treatment. I have discussed any further diagnostic evaluation that may be needed or ordered today. We also reviewed her medications today. she has been encouraged to call the office with any questions or concerns that should arise related to todays visit.    No orders of the defined types were placed in this encounter.   Meds ordered this encounter  Medications   oxybutynin  (DITROPAN -XL) 10 MG 24 hr tablet    Sig: Take 1 tablet (10 mg total) by mouth at bedtime.    Dispense:  30 tablet    Refill:  5     Note decreased dose, fill new script today, discontinue 15 mg tablet.   predniSONE  (DELTASONE ) 10 MG tablet    Sig: Take 1 tablet (10 mg total) by mouth daily with breakfast.    Dispense:  30 tablet    Refill:  2    Please fill ASAP and call patient when they are ready   clonazePAM  (KLONOPIN ) 0.5 MG tablet    Sig: Take 1 tablet (0.5 mg total) by mouth daily.    Dispense:  90 tablet    Refill:  0   estradiol  (ESTRACE ) 0.5 MG tablet    Sig: Take 1 tablet (0.5 mg total) by mouth daily.    Dispense:  90 tablet    Refill:  1   bisoprolol -hydrochlorothiazide  (ZIAC ) 10-6.25 MG tablet    Sig: TAKE 1 TABLET BY MOUTH EVERY DAY IN THE MORNING FOR BLOOD PRESSURE    Dispense:  90 tablet    Refill:  1   amLODipine  (NORVASC ) 2.5 MG tablet    Sig: Take 1 tablet (2.5 mg total) by mouth daily.    Dispense:  90 tablet    Refill:  1   azithromycin  (ZITHROMAX ) 250 MG tablet    Sig: Take 2 tablets on day 1, then 1 tablet daily on days 2 through 5    Dispense:  6 tablet    Refill:  0    Fill new script today    Return in about 3 months (around 11/23/2023) for F/U, anxiety med refill, Tishawn Friedhoff PCP.   Total time spent:30 Minutes Time spent includes review of chart, medications, test results, and follow up plan with the patient.   [place] Controlled Substance Database was reviewed by me.  This patient was seen by Mardy Maxin, FNP-C in collaboration with Dr. Sigrid Bathe as a part of collaborative care agreement.   Matthews Franks R. Maxin, MSN, FNP-C Internal medicine

## 2023-09-05 ENCOUNTER — Other Ambulatory Visit: Payer: Self-pay | Admitting: Nurse Practitioner

## 2023-09-05 DIAGNOSIS — N393 Stress incontinence (female) (male): Secondary | ICD-10-CM

## 2023-09-23 ENCOUNTER — Other Ambulatory Visit: Payer: Self-pay | Admitting: Nurse Practitioner

## 2023-09-23 DIAGNOSIS — Z79899 Other long term (current) drug therapy: Secondary | ICD-10-CM

## 2023-09-25 NOTE — Telephone Encounter (Signed)
Please review and send

## 2023-11-20 ENCOUNTER — Ambulatory Visit: Payer: BC Managed Care – PPO | Admitting: Nurse Practitioner

## 2023-12-11 ENCOUNTER — Ambulatory Visit: Admitting: Nurse Practitioner

## 2023-12-11 ENCOUNTER — Encounter: Payer: Self-pay | Admitting: Nurse Practitioner

## 2023-12-11 VITALS — BP 124/86 | HR 101 | Temp 98.1°F | Resp 16 | Ht 66.0 in | Wt 163.0 lb

## 2023-12-11 DIAGNOSIS — N393 Stress incontinence (female) (male): Secondary | ICD-10-CM

## 2023-12-11 DIAGNOSIS — M064 Inflammatory polyarthropathy: Secondary | ICD-10-CM | POA: Diagnosis not present

## 2023-12-11 DIAGNOSIS — N39490 Overflow incontinence: Secondary | ICD-10-CM

## 2023-12-11 DIAGNOSIS — G63 Polyneuropathy in diseases classified elsewhere: Secondary | ICD-10-CM | POA: Diagnosis not present

## 2023-12-11 DIAGNOSIS — Z79899 Other long term (current) drug therapy: Secondary | ICD-10-CM

## 2023-12-11 DIAGNOSIS — D860 Sarcoidosis of lung: Secondary | ICD-10-CM

## 2023-12-11 DIAGNOSIS — F411 Generalized anxiety disorder: Secondary | ICD-10-CM

## 2023-12-11 MED ORDER — HYDROXYCHLOROQUINE SULFATE 200 MG PO TABS: 200.0000 mg | ORAL_TABLET | Freq: Two times a day (BID) | ORAL | 1 refills | Status: DC

## 2023-12-11 MED ORDER — NORTRIPTYLINE HCL 50 MG PO CAPS: 50.0000 mg | ORAL_CAPSULE | Freq: Every day | ORAL | 1 refills | Status: DC

## 2023-12-11 MED ORDER — PREDNISONE 10 MG PO TABS: 10.0000 mg | ORAL_TABLET | Freq: Every day | ORAL | 2 refills | Status: DC

## 2023-12-11 MED ORDER — SOLIFENACIN SUCCINATE 10 MG PO TABS: 10.0000 mg | ORAL_TABLET | Freq: Every day | ORAL | 5 refills | Status: DC

## 2023-12-11 MED ORDER — CLONAZEPAM 0.5 MG PO TABS: 0.5000 mg | ORAL_TABLET | Freq: Every day | ORAL | 0 refills | Status: DC

## 2023-12-11 MED ORDER — CELECOXIB 200 MG PO CAPS: 200.0000 mg | ORAL_CAPSULE | Freq: Every day | ORAL | 3 refills | Status: AC

## 2023-12-11 NOTE — Progress Notes (Signed)
 Brittany Huynh [address] [place], [place] [address]  Internal MEDICINE  Office Visit Note  Patient Name: Brittany Huynh  [id number]  [id number]  Date of Service: 12/11/2023  Chief Complaint  Patient presents with   Hypertension   Follow-up    HPI Brittany Huynh presents for a follow-up visit for  Neuropathy -- burning in feet still bad, happens throughout the day off and on.      Current Medication: Outpatient Encounter Medications as of 12/11/2023  Medication Sig Note   acetaminophen  (TYLENOL ) 500 MG tablet Take 500 mg by mouth every 6 (six) hours as needed. 09/29/2020: Can take 1 or 2 at a time   albuterol  (VENTOLIN  HFA) 108 (90 Base) MCG/ACT inhaler Inhale 2 puffs into the lungs every 6 (six) hours as needed for wheezing or shortness of breath.    busPIRone  (BUSPAR ) 7.5 MG tablet TAKE BUSPAR  7.5 MG NIGHTLY FOR 1 WEEK, THEN INCREASE TO 7.5 MG TWICE DAILY AND CONTINUE THIS DOSE.    DULoxetine  (CYMBALTA ) 60 MG capsule Take by mouth.    estradiol  (ESTRACE ) 0.5 MG tablet Take 1 tablet (0.5 mg total) by mouth daily.    metoCLOPramide  (REGLAN ) 5 MG tablet Take 1 tablet (5 mg total) by mouth 4 (four) times daily -  before meals and at bedtime. As needed only    QUEtiapine (SEROQUEL) 25 MG tablet Take 3 tablets by mouth at bedtime.    solifenacin  (VESICARE ) 10 MG tablet Take 1 tablet (10 mg total) by mouth daily.    [DISCONTINUED] amLODipine  (NORVASC ) 2.5 MG tablet Take 1 tablet (2.5 mg total) by mouth daily.    [DISCONTINUED] bisoprolol -hydrochlorothiazide  (ZIAC ) 10-6.25 MG tablet TAKE 1 TABLET BY MOUTH EVERY DAY IN THE MORNING FOR BLOOD PRESSURE    [DISCONTINUED] celecoxib  (CELEBREX ) 200 MG capsule Take 1 capsule (200 mg total) by mouth daily.    [DISCONTINUED] clonazePAM  (KLONOPIN ) 0.5 MG tablet Take 1 tablet (0.5 mg total) by mouth daily.    [DISCONTINUED] hydroxychloroquine  (PLAQUENIL ) 200 MG tablet Take 1 tablet (200 mg total) by mouth 2 (two) times daily.    [DISCONTINUED]  nortriptyline  (PAMELOR ) 50 MG capsule TAKE 1 CAPSULE BY MOUTH AT BEDTIME    [DISCONTINUED] oxybutynin  (DITROPAN -XL) 10 MG 24 hr tablet Take 1 tablet (10 mg total) by mouth at bedtime.    [DISCONTINUED] predniSONE  (DELTASONE ) 10 MG tablet Take 1 tablet (10 mg total) by mouth daily with breakfast.    celecoxib  (CELEBREX ) 200 MG capsule Take 1 capsule (200 mg total) by mouth daily.    clonazePAM  (KLONOPIN ) 0.5 MG tablet Take 1 tablet (0.5 mg total) by mouth daily.    hydroxychloroquine  (PLAQUENIL ) 200 MG tablet Take 1 tablet (200 mg total) by mouth 2 (two) times daily.    nortriptyline  (PAMELOR ) 50 MG capsule Take 1 capsule (50 mg total) by mouth at bedtime.    predniSONE  (DELTASONE ) 10 MG tablet Take 1 tablet (10 mg total) by mouth daily with breakfast.    No facility-administered encounter medications on file as of 12/11/2023.    Surgical History: Past Surgical History:  Procedure Laterality Date   ABDOMINAL HYSTERECTOMY  2009   BREAST BIOPSY Left 02/15/2018   PREDOMINANTLY MATURE ADIPOSE TISSUE WITH VERY FOCAL AREAS OF BENIGN    BREAST EXCISIONAL BIOPSY     chamberlain procedure   2011   COLONOSCOPY WITH PROPOFOL  N/A 09/02/2020   Procedure: COLONOSCOPY WITH PROPOFOL ;  Surgeon: Selena Daily, MD;  Location: [facility];  Service: Gastroenterology;  Laterality: N/A;  ESOPHAGOGASTRODUODENOSCOPY (EGD) WITH PROPOFOL  N/A 09/02/2020   Procedure: ESOPHAGOGASTRODUODENOSCOPY (EGD) WITH PROPOFOL ;  Surgeon: Selena Daily, MD;  Location: [facility];  Service: Gastroenterology;  Laterality: N/A;   IRRIGATION AND DEBRIDEMENT HEMATOMA Left 03/07/2018   Procedure: IRRIGATION AND DEBRIDEMENT HEMATOMA-LEFT BREAST;  Surgeon: Marshall Skeeter, MD;  Location: [facility] ORS;  Service: General;  Laterality: Left;   LUNG BIOPSY  2011   RESECTION RIBS EXTRAPLEURAL  [id number]    Medical History: Past Medical History:  Diagnosis Date   Anemia    Anxiety    Asthma    Breast fibrocystic disorder 2013    Edema 09/15/2017   Hernia 2011   chest wall   Hypertension    Iron deficiency anemia 09/15/2017   Lung mass 2011   Sarcoidosis    Shortness of breath    2011   Stroke (HCC)    Tachycardia, unspecified 09/15/2017    Family History: Family History  Problem Relation Age of Onset   Liver cancer Mother 26   Bone cancer Maternal Aunt 60   Breast cancer Maternal Aunt    Breast cancer Paternal Aunt    Kidney cancer Father    Bone cancer Paternal Aunt 56    Social History   Socioeconomic History   Marital status: Divorced    Spouse name: Not on file   Number of children: Not on file   Years of education: Not on file   Highest education level: Not on file  Occupational History   Not on file  Tobacco Use   Smoking status: Never   Smokeless tobacco: Never  Vaping Use   Vaping status: Never Used  Substance and Sexual Activity   Alcohol use: Not Currently    Comment: on occassion   Drug use: No   Sexual activity: Not on file  Other Topics Concern   Not on file  Social History Narrative   Not on file   Social Drivers of Health   Financial Resource Strain: Not on file  Food Insecurity: Not on file  Transportation Needs: Not on file  Physical Activity: Not on file  Stress: Not on file  Social Connections: Not on file  Intimate Partner Violence: Not on file      Review of Systems  Constitutional:  Positive for fatigue.  HENT:  Negative for congestion, postnasal drip, rhinorrhea, sinus pressure, sinus pain, sneezing and sore throat.   Respiratory:  Negative for cough, chest tightness, shortness of breath and wheezing.   Cardiovascular: Negative.  Negative for chest pain and palpitations.  Gastrointestinal:  Negative for abdominal pain.  Musculoskeletal:  Positive for arthralgias (chronic knee pain), back pain, gait problem and myalgias.       Burning in feet  Neurological:  Positive for weakness and numbness.    Vital Signs: BP 124/86   Pulse (!) 101   Temp  98.1 F (36.7 C)   Resp 16   Ht 5\' 6"  (1.676 m)   Wt 163 lb (73.9 kg)   SpO2 99%   BMI 26.31 kg/m    Physical Exam Vitals reviewed.  Constitutional:      General: She is not in acute distress.    Appearance: Normal appearance.  HENT:     Head: Normocephalic and atraumatic.  Eyes:     Pupils: Pupils are equal, round, and reactive to light.  Cardiovascular:     Rate and Rhythm: Normal rate and regular rhythm.  Pulmonary:     Effort: Pulmonary effort is normal. No  respiratory distress.  Musculoskeletal:     Right knee: Swelling and crepitus present. Decreased range of motion. Tenderness present.     Left knee: Swelling and crepitus present. Decreased range of motion. Tenderness present.  Neurological:     Mental Status: She is alert and oriented to person, place, and time.     Motor: Weakness present.  Psychiatric:        Mood and Affect: Mood normal.        Behavior: Behavior normal.        Assessment/Plan: 1. 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 She has tried gabapentin and lyrica but these did not help. She is already on other medications that can help with neuropathy but they are also not effective for this.   2. Inflammatory polyarthritis (HCC) Continue celebrex , nortriptyline  and plaquenil  as prescribed.  - celecoxib  (CELEBREX ) 200 MG capsule; Take 1 capsule (200 mg total) by mouth daily.  Dispense: 90 capsule; Refill: 3 - nortriptyline  (PAMELOR ) 50 MG capsule; Take 1 capsule (50 mg total) by mouth at bedtime.  Dispense: 90 capsule; Refill: 1 - hydroxychloroquine  (PLAQUENIL ) 200 MG tablet; Take 1 tablet (200 mg total) by mouth 2 (two) times daily.  Dispense: 180 tablet; Refill: 1  3. Sarcoidosis of lung (HCC) Continue prednisone  and nortriptyline  as prescribed.  - predniSONE  (DELTASONE ) 10 MG tablet; Take 1 tablet (10 mg total) by mouth daily with breakfast.  Dispense: 30 tablet; Refill: 2 - nortriptyline  (PAMELOR ) 50 MG  capsule; Take 1 capsule (50 mg total) by mouth at bedtime.  Dispense: 90 capsule; Refill: 1  4. Overflow stress urinary incontinence in female Discontinue oxybutynin  now. Start solifenacin  as prescribed.  - solifenacin  (VESICARE ) 10 MG tablet; Take 1 tablet (10 mg total) by mouth daily.  Dispense: 30 tablet; Refill: 5  5. Generalized anxiety disorder Continue clonazepam  as prescribed. Follow up in 3 months for additional refills - clonazePAM  (KLONOPIN ) 0.5 MG tablet; Take 1 tablet (0.5 mg total) by mouth daily.  Dispense: 90 tablet; Refill: 0   General Counseling: Brittany Huynh verbalizes understanding of the findings of todays visit and agrees with plan of treatment. I have discussed any further diagnostic evaluation that may be needed or ordered today. We also reviewed her medications today. she has been encouraged to call the office with any questions or concerns that should arise related to todays visit.    No orders of the defined types were placed in this encounter.   Meds ordered this encounter  Medications   clonazePAM  (KLONOPIN ) 0.5 MG tablet    Sig: Take 1 tablet (0.5 mg total) by mouth daily.    Dispense:  90 tablet    Refill:  0   celecoxib  (CELEBREX ) 200 MG capsule    Sig: Take 1 capsule (200 mg total) by mouth daily.    Dispense:  90 capsule    Refill:  3   predniSONE  (DELTASONE ) 10 MG tablet    Sig: Take 1 tablet (10 mg total) by mouth daily with breakfast.    Dispense:  30 tablet    Refill:  2    Please fill ASAP and call patient when they are ready   nortriptyline  (PAMELOR ) 50 MG capsule    Sig: Take 1 capsule (50 mg total) by mouth at bedtime.    Dispense:  90 capsule    Refill:  1   hydroxychloroquine  (PLAQUENIL ) 200 MG tablet    Sig: Take 1 tablet (200 mg total) by mouth 2 (two)  times daily.    Dispense:  180 tablet    Refill:  1   solifenacin  (VESICARE ) 10 MG tablet    Sig: Take 1 tablet (10 mg total) by mouth daily.    Dispense:  30 tablet    Refill:  5     Discontinue oxybutynin  and fill new script today    Return in about 3 months (around 03/06/2024) for F/U, anxiety med refill, Brittany Huynh PCP.   Total time spent:30 Minutes Time spent includes review of chart, medications, test results, and follow up plan with the patient.   [place] Controlled Substance Database was reviewed by me.  This patient was seen by Brittany Huynh, Brittany Huynh in collaboration with Dr. Verneta Huynh as a part of collaborative care agreement.   Brittany Delancey R. Brittany Burow, MSN, [facility] Internal medicine

## 2023-12-13 ENCOUNTER — Encounter: Payer: Self-pay | Admitting: Nurse Practitioner

## 2024-03-04 ENCOUNTER — Encounter: Payer: Self-pay | Admitting: Nurse Practitioner

## 2024-03-04 ENCOUNTER — Ambulatory Visit: Admitting: Nurse Practitioner

## 2024-03-04 VITALS — BP 130/88 | HR 102 | Temp 97.2°F | Resp 16 | Ht 66.0 in | Wt 155.8 lb

## 2024-03-04 DIAGNOSIS — D860 Sarcoidosis of lung: Secondary | ICD-10-CM

## 2024-03-04 DIAGNOSIS — N393 Stress incontinence (female) (male): Secondary | ICD-10-CM | POA: Diagnosis not present

## 2024-03-04 DIAGNOSIS — K117 Disturbances of salivary secretion: Secondary | ICD-10-CM

## 2024-03-04 DIAGNOSIS — M064 Inflammatory polyarthropathy: Secondary | ICD-10-CM | POA: Diagnosis not present

## 2024-03-04 DIAGNOSIS — G63 Polyneuropathy in diseases classified elsewhere: Secondary | ICD-10-CM | POA: Diagnosis not present

## 2024-03-04 DIAGNOSIS — F411 Generalized anxiety disorder: Secondary | ICD-10-CM

## 2024-03-04 DIAGNOSIS — N39490 Overflow incontinence: Secondary | ICD-10-CM

## 2024-03-04 MED ORDER — NORTRIPTYLINE HCL 50 MG PO CAPS: 100.0000 mg | ORAL_CAPSULE | Freq: Every day | ORAL | 5 refills | Status: DC

## 2024-03-04 MED ORDER — CLONAZEPAM 0.5 MG PO TABS: 0.5000 mg | ORAL_TABLET | Freq: Every day | ORAL | 0 refills | Status: DC

## 2024-03-04 MED ORDER — SOLIFENACIN SUCCINATE 10 MG PO TABS
10.0000 mg | ORAL_TABLET | Freq: Every day | ORAL | 5 refills | Status: AC
Start: 2024-03-04 — End: ?

## 2024-03-04 NOTE — Progress Notes (Signed)
 [facility] [address] [place], [place] [address]  Internal MEDICINE  Office Visit Note  Patient Name: Brittany Huynh  [id number]  [id number]  Date of Service: 03/04/2024  Chief Complaint  Patient presents with   Hypertension   Follow-up    HPI Brittany Huynh presents for a follow-up visit for arthritis, anxiety and medication refills  Pain in both hands with decreased strength -- Getting difficult to open jars Inflammatory polyarthritis -- taking nortriptyline  which is helping.  Anxiety -- takes clonazepam  as needed.  Overactive bladder and urinary incontinence -- solifenacin  is helping some but states that she is being charge $30 for it. Copay should be tier 1. Looked into tolterodine but that is expensive too. Not interested in urology referral yet. Depression -- was asking to go back on remeron  but patient is already on nortriptyline  and duloxetine . Discussed increasing nortriptyline  dose which she is agreeable to.     Current Medication: Outpatient Encounter Medications as of 03/04/2024  Medication Sig Note   solifenacin  (VESICARE ) 10 MG tablet Take 1 tablet (10 mg total) by mouth daily.    acetaminophen  (TYLENOL ) 500 MG tablet Take 500 mg by mouth every 6 (six) hours as needed. 09/29/2020: Can take 1 or 2 at a time   albuterol  (VENTOLIN  HFA) 108 (90 Base) MCG/ACT inhaler Inhale 2 puffs into the lungs every 6 (six) hours as needed for wheezing or shortness of breath.    busPIRone  (BUSPAR ) 7.5 MG tablet TAKE BUSPAR  7.5 MG NIGHTLY FOR 1 WEEK, THEN INCREASE TO 7.5 MG TWICE DAILY AND CONTINUE THIS DOSE.    celecoxib  (CELEBREX ) 200 MG capsule Take 1 capsule (200 mg total) by mouth daily.    clonazePAM  (KLONOPIN ) 0.5 MG tablet Take 1 tablet (0.5 mg total) by mouth daily.    DULoxetine  (CYMBALTA ) 60 MG capsule Take by mouth.    estradiol  (ESTRACE ) 0.5 MG tablet Take 1 tablet (0.5 mg total) by mouth daily.    hydroxychloroquine  (PLAQUENIL ) 200 MG tablet Take 1 tablet (200 mg  total) by mouth 2 (two) times daily.    metoCLOPramide  (REGLAN ) 5 MG tablet Take 1 tablet (5 mg total) by mouth 4 (four) times daily -  before meals and at bedtime. As needed only    nortriptyline  (PAMELOR ) 50 MG capsule Take 2 capsules (100 mg total) by mouth at bedtime.    predniSONE  (DELTASONE ) 10 MG tablet Take 1 tablet (10 mg total) by mouth daily with breakfast.    QUEtiapine (SEROQUEL) 25 MG tablet Take 3 tablets by mouth at bedtime.    solifenacin  (VESICARE ) 10 MG tablet Take 1 tablet (10 mg total) by mouth daily.    [DISCONTINUED] clonazePAM  (KLONOPIN ) 0.5 MG tablet Take 1 tablet (0.5 mg total) by mouth daily.    [DISCONTINUED] nortriptyline  (PAMELOR ) 50 MG capsule Take 1 capsule (50 mg total) by mouth at bedtime.    No facility-administered encounter medications on file as of 03/04/2024.    Surgical History: Past Surgical History:  Procedure Laterality Date   ABDOMINAL HYSTERECTOMY  2009   BREAST BIOPSY Left 02/15/2018   PREDOMINANTLY MATURE ADIPOSE TISSUE WITH VERY FOCAL AREAS OF BENIGN    BREAST EXCISIONAL BIOPSY     chamberlain procedure   2011   COLONOSCOPY WITH PROPOFOL  N/A 09/02/2020   Procedure: COLONOSCOPY WITH PROPOFOL ;  Surgeon: Unk Corinn Skiff, MD;  Location: [facility];  Service: Gastroenterology;  Laterality: N/A;   ESOPHAGOGASTRODUODENOSCOPY (EGD) WITH PROPOFOL  N/A 09/02/2020   Procedure: ESOPHAGOGASTRODUODENOSCOPY (EGD) WITH PROPOFOL ;  Surgeon: Unk,  Corinn Skiff, MD;  Location: [facility];  Service: Gastroenterology;  Laterality: N/A;   IRRIGATION AND DEBRIDEMENT HEMATOMA Left 03/07/2018   Procedure: IRRIGATION AND DEBRIDEMENT HEMATOMA-LEFT BREAST;  Surgeon: Dessa Reyes ORN, MD;  Location: [facility] OR[facility];  Service: General;  Laterality: Left;   LUNG BIOPSY  2011   RESECTION RIBS EXTRAPLEURAL  [id number]    Medical History: Past Medical History:  Diagnosis Date   Anemia    Anxiety    Asthma    Breast fibrocystic disorder 2013   Edema 09/15/2017   Hernia  2011   chest wall   Hypertension    Iron deficiency anemia 09/15/2017   Lung mass 2011   Sarcoidosis    Shortness of breath    2011   Stroke (HCC)    Tachycardia, unspecified 09/15/2017    Family History: Family History  Problem Relation Age of Onset   Liver cancer Mother 63   Bone cancer Maternal Aunt 60   Breast cancer Maternal Aunt    Breast cancer Paternal Aunt    Kidney cancer Father    Bone cancer Paternal Aunt 9    Social History   Socioeconomic History   Marital status: Divorced    Spouse name: Not on file   Number of children: Not on file   Years of education: Not on file   Highest education level: Not on file  Occupational History   Not on file  Tobacco Use   Smoking status: Never   Smokeless tobacco: Never  Vaping Use   Vaping status: Never Used  Substance and Sexual Activity   Alcohol use: Not Currently    Comment: on occassion   Drug use: No   Sexual activity: Not on file  Other Topics Concern   Not on file  Social History Narrative   Not on file   Social Drivers of Health   Financial Resource Strain: Not on file  Food Insecurity: Not on file  Transportation Needs: Not on file  Physical Activity: Not on file  Stress: Not on file  Social Connections: Not on file  Intimate Partner Violence: Not on file      Review of Systems  Constitutional:  Positive for fatigue.  HENT:  Negative for congestion, postnasal drip, rhinorrhea, sinus pressure, sinus pain, sneezing and sore throat.   Respiratory:  Negative for cough, chest tightness, shortness of breath and wheezing.   Cardiovascular: Negative.  Negative for chest pain and palpitations.  Gastrointestinal:  Negative for abdominal pain.  Musculoskeletal:  Positive for arthralgias (chronic knee pain), back pain, gait problem and myalgias.       Burning in feet  Neurological:  Positive for weakness and numbness.    Vital Signs: BP 130/88   Pulse (!) 102   Temp (!) 97.2 F (36.2 C)   Resp  16   Ht 5' 6 (1.676 m)   Wt 155 lb 12.8 oz (70.7 kg)   SpO2 94%   BMI 25.15 kg/m    Physical Exam Vitals reviewed.  Constitutional:      General: She is not in acute distress.    Appearance: Normal appearance.  HENT:     Head: Normocephalic and atraumatic.  Eyes:     Pupils: Pupils are equal, round, and reactive to light.  Cardiovascular:     Rate and Rhythm: Normal rate and regular rhythm.  Pulmonary:     Effort: Pulmonary effort is normal. No respiratory distress.  Musculoskeletal:     Right knee: Swelling and crepitus  present. Decreased range of motion. Tenderness present.     Left knee: Swelling and crepitus present. Decreased range of motion. Tenderness present.  Neurological:     Mental Status: She is alert and oriented to person, place, and time.     Motor: Weakness present.  Psychiatric:        Mood and Affect: Mood normal.        Behavior: Behavior normal.        Assessment/Plan: 1. 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  - solifenacin  (VESICARE ) 10 MG tablet; Take 1 tablet (10 mg total) by mouth daily.  Dispense: 30 tablet; Refill: 5  2. Inflammatory polyarthritis (HCC) Nortriptyline  dose increased  - nortriptyline  (PAMELOR ) 50 MG capsule; Take 2 capsules (100 mg total) by mouth at bedtime.  Dispense: 60 capsule; Refill: 5  3. Sarcoidosis of lung (HCC) Nortriptyline  dose increased  - nortriptyline  (PAMELOR ) 50 MG capsule; Take 2 capsules (100 mg total) by mouth at bedtime.  Dispense: 60 capsule; Refill: 5  4. 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   5. Xerostomia Side effect of medication, tolerable at this time.   6. Generalized anxiety disorder Continue prn clonazepam  as prescribed. Follow up in 3 months for additional refills.  - clonazePAM  (KLONOPIN ) 0.5 MG tablet; Take 1 tablet (0.5 mg total) by mouth daily.  Dispense: 90 tablet; Refill:  0   General Counseling: Brittany Huynh verbalizes understanding of the findings of todays visit and agrees with plan of treatment. I have discussed any further diagnostic evaluation that may be needed or ordered today. We also reviewed her medications today. she has been encouraged to call the office with any questions or concerns that should arise related to todays visit.    No orders of the defined types were placed in this encounter.   Meds ordered this encounter  Medications   nortriptyline  (PAMELOR ) 50 MG capsule    Sig: Take 2 capsules (100 mg total) by mouth at bedtime.    Dispense:  60 capsule    Refill:  5    Note increased dose to 100 mg daily. Fill new script today.   clonazePAM  (KLONOPIN ) 0.5 MG tablet    Sig: Take 1 tablet (0.5 mg total) by mouth daily.    Dispense:  90 tablet    Refill:  0   solifenacin  (VESICARE ) 10 MG tablet    Sig: Take 1 tablet (10 mg total) by mouth daily.    Dispense:  30 tablet    Refill:  5    Please make sure to run through her insurance. Should be a tier 1 copay.    Return in about 3 months (around 05/29/2024) for CPE, Cleophas Yoak PCP and anxiety med refills .   Total time spent:30 Minutes Time spent includes review of chart, medications, test results, and follow up plan with the patient.   [place] Controlled Substance Database was reviewed by me.  This patient was seen by Mardy Maxin, FNP-C in collaboration with Dr. Sigrid Bathe as a part of collaborative care agreement.   Josephus Harriger R. Maxin, MSN, FNP-C Internal medicine

## 2024-03-31 ENCOUNTER — Other Ambulatory Visit: Payer: Self-pay | Admitting: Nurse Practitioner

## 2024-03-31 DIAGNOSIS — D860 Sarcoidosis of lung: Secondary | ICD-10-CM

## 2024-03-31 DIAGNOSIS — M064 Inflammatory polyarthropathy: Secondary | ICD-10-CM

## 2024-04-19 ENCOUNTER — Other Ambulatory Visit: Payer: Self-pay | Admitting: Nurse Practitioner

## 2024-04-19 DIAGNOSIS — D860 Sarcoidosis of lung: Secondary | ICD-10-CM

## 2024-06-02 ENCOUNTER — Other Ambulatory Visit: Payer: Self-pay | Admitting: Nurse Practitioner

## 2024-06-02 DIAGNOSIS — Z79899 Other long term (current) drug therapy: Secondary | ICD-10-CM

## 2024-06-03 ENCOUNTER — Encounter: Payer: Self-pay | Admitting: Nurse Practitioner

## 2024-06-03 ENCOUNTER — Telehealth: Payer: Self-pay | Admitting: Nurse Practitioner

## 2024-06-03 ENCOUNTER — Ambulatory Visit: Admitting: Nurse Practitioner

## 2024-06-03 VITALS — BP 134/86 | HR 100 | Temp 97.0°F | Resp 16 | Ht 66.0 in | Wt 170.0 lb

## 2024-06-03 DIAGNOSIS — Z0001 Encounter for general adult medical examination with abnormal findings: Secondary | ICD-10-CM | POA: Diagnosis not present

## 2024-06-03 DIAGNOSIS — D860 Sarcoidosis of lung: Secondary | ICD-10-CM | POA: Diagnosis not present

## 2024-06-03 DIAGNOSIS — E559 Vitamin D deficiency, unspecified: Secondary | ICD-10-CM

## 2024-06-03 DIAGNOSIS — R7989 Other specified abnormal findings of blood chemistry: Secondary | ICD-10-CM | POA: Diagnosis not present

## 2024-06-03 DIAGNOSIS — F411 Generalized anxiety disorder: Secondary | ICD-10-CM

## 2024-06-03 DIAGNOSIS — E538 Deficiency of other specified B group vitamins: Secondary | ICD-10-CM

## 2024-06-03 DIAGNOSIS — E782 Mixed hyperlipidemia: Secondary | ICD-10-CM

## 2024-06-03 DIAGNOSIS — R7303 Prediabetes: Secondary | ICD-10-CM | POA: Diagnosis not present

## 2024-06-03 DIAGNOSIS — K219 Gastro-esophageal reflux disease without esophagitis: Secondary | ICD-10-CM | POA: Diagnosis not present

## 2024-06-03 DIAGNOSIS — Z1231 Encounter for screening mammogram for malignant neoplasm of breast: Secondary | ICD-10-CM

## 2024-06-03 MED ORDER — ALBUTEROL SULFATE HFA 108 (90 BASE) MCG/ACT IN AERS: 2.0000 | INHALATION_SPRAY | Freq: Four times a day (QID) | RESPIRATORY_TRACT | 3 refills | Status: AC | PRN

## 2024-06-03 MED ORDER — HYDROXYCHLOROQUINE SULFATE 200 MG PO TABS: 200.0000 mg | ORAL_TABLET | Freq: Two times a day (BID) | ORAL | 1 refills | Status: AC

## 2024-06-03 MED ORDER — PREDNISONE 10 MG PO TABS: 10.0000 mg | ORAL_TABLET | Freq: Every day | ORAL | 2 refills | Status: AC

## 2024-06-03 MED ORDER — PANTOPRAZOLE SODIUM 20 MG PO TBEC: 20.0000 mg | DELAYED_RELEASE_TABLET | Freq: Every day | ORAL | 3 refills | Status: DC

## 2024-06-03 MED ORDER — ESTRADIOL 0.5 MG PO TABS: 0.5000 mg | ORAL_TABLET | Freq: Every day | ORAL | 1 refills | Status: AC

## 2024-06-03 MED ORDER — METOCLOPRAMIDE HCL 5 MG PO TABS: 5.0000 mg | ORAL_TABLET | Freq: Three times a day (TID) | ORAL | 3 refills | Status: AC

## 2024-06-03 MED ORDER — CLONAZEPAM 0.5 MG PO TABS: 0.5000 mg | ORAL_TABLET | Freq: Every day | ORAL | 0 refills | Status: DC

## 2024-06-03 NOTE — Progress Notes (Signed)
 [facility] [address] [place], [place] [address]  Internal MEDICINE  Office Visit Note  Patient Name: Brittany Huynh  [id number]  [id number]  Date of Service: 06/03/2024  Chief Complaint  Patient presents with   Hypertension   Annual Exam   Quality Metric Gaps    Mammogram    [name] presents for an annual well visit and physical exam. Well-appearing 58 y.o. female with hypertension, migraines, asthma, sarcoidosis, GAD, inflammatory polyarthritis, osteopenia, RLS, and neuropathy.  Routine CRC screening: due for colonoscopy in January 2027.  Routine mammogram: due now  DEXA scan: due in December 2026 Pap smear: due in September next year  Labs: due for routine labs  New or worsening pain: chronic pain  Other concerns: Acid reflux occasionally -- not on a PPI Family was telling her to get checked for a specific autoimmune problem but she forgot the name of it, she will call back later once she hears from her family member.    Current Medication: Outpatient Encounter Medications as of 06/03/2024  Medication Sig Note   pantoprazole  (PROTONIX ) 20 MG tablet Take 1 tablet (20 mg total) by mouth daily.    acetaminophen  (TYLENOL ) 500 MG tablet Take 500 mg by mouth every 6 (six) hours as needed. 09/29/2020: Can take 1 or 2 at a time   albuterol  (VENTOLIN  HFA) 108 (90 Base) MCG/ACT inhaler Inhale 2 puffs into the lungs every 6 (six) hours as needed for wheezing or shortness of breath.    celecoxib  (CELEBREX ) 200 MG capsule Take 1 capsule (200 mg total) by mouth daily.    clonazePAM  (KLONOPIN ) 0.5 MG tablet Take 1 tablet (0.5 mg total) by mouth daily.    DULoxetine  (CYMBALTA ) 60 MG capsule Take by mouth.    estradiol  (ESTRACE ) 0.5 MG tablet Take 1 tablet (0.5 mg total) by mouth daily.    hydroxychloroquine  (PLAQUENIL ) 200 MG tablet Take 1 tablet (200 mg total) by mouth 2 (two) times daily.    metoCLOPramide  (REGLAN ) 5 MG tablet Take 1 tablet (5 mg total) by mouth 4 (four)  times daily -  before meals and at bedtime. As needed only    nortriptyline  (PAMELOR ) 50 MG capsule TAKE 2 CAPSULES (100 MG TOTAL) BY MOUTH AT BEDTIME.    predniSONE  (DELTASONE ) 10 MG tablet Take 1 tablet (10 mg total) by mouth daily with breakfast.    QUEtiapine (SEROQUEL) 25 MG tablet Take 3 tablets by mouth at bedtime.    solifenacin  (VESICARE ) 10 MG tablet Take 1 tablet (10 mg total) by mouth daily.    [DISCONTINUED] albuterol  (VENTOLIN  HFA) 108 (90 Base) MCG/ACT inhaler Inhale 2 puffs into the lungs every 6 (six) hours as needed for wheezing or shortness of breath.    [DISCONTINUED] busPIRone  (BUSPAR ) 7.5 MG tablet TAKE BUSPAR  7.5 MG NIGHTLY FOR 1 WEEK, THEN INCREASE TO 7.5 MG TWICE DAILY AND CONTINUE THIS DOSE.    [DISCONTINUED] clonazePAM  (KLONOPIN ) 0.5 MG tablet Take 1 tablet (0.5 mg total) by mouth daily.    [DISCONTINUED] estradiol  (ESTRACE ) 0.5 MG tablet Take 1 tablet (0.5 mg total) by mouth daily.    [DISCONTINUED] hydroxychloroquine  (PLAQUENIL ) 200 MG tablet Take 1 tablet (200 mg total) by mouth 2 (two) times daily.    [DISCONTINUED] metoCLOPramide  (REGLAN ) 5 MG tablet Take 1 tablet (5 mg total) by mouth 4 (four) times daily -  before meals and at bedtime. As needed only    [DISCONTINUED] predniSONE  (DELTASONE ) 10 MG tablet TAKE 1 TABLET (10 MG TOTAL) BY MOUTH  DAILY WITH BREAKFAST.    [DISCONTINUED] solifenacin  (VESICARE ) 10 MG tablet Take 1 tablet (10 mg total) by mouth daily.    No facility-administered encounter medications on file as of 06/03/2024.    Surgical History: Past Surgical History:  Procedure Laterality Date   ABDOMINAL HYSTERECTOMY  2009   BREAST BIOPSY Left 02/15/2018   PREDOMINANTLY MATURE ADIPOSE TISSUE WITH VERY FOCAL AREAS OF BENIGN    BREAST EXCISIONAL BIOPSY     chamberlain procedure   2011   COLONOSCOPY WITH PROPOFOL  N/A 09/02/2020   Procedure: COLONOSCOPY WITH PROPOFOL ;  Surgeon: Unk Corinn Skiff, MD;  Location: [facility] ENDOSCOPY;  Service:  Gastroenterology;  Laterality: N/A;   ESOPHAGOGASTRODUODENOSCOPY (EGD) WITH PROPOFOL  N/A 09/02/2020   Procedure: ESOPHAGOGASTRODUODENOSCOPY (EGD) WITH PROPOFOL ;  Surgeon: Unk Corinn Skiff, MD;  Location: [facility] ENDOSCOPY;  Service: Gastroenterology;  Laterality: N/A;   IRRIGATION AND DEBRIDEMENT HEMATOMA Left 03/07/2018   Procedure: IRRIGATION AND DEBRIDEMENT HEMATOMA-LEFT BREAST;  Surgeon: Dessa Reyes ORN, MD;  Location: [facility] ORS;  Service: General;  Laterality: Left;   LUNG BIOPSY  2011   RESECTION RIBS EXTRAPLEURAL  [id number]    Medical History: Past Medical History:  Diagnosis Date   Anemia    Anxiety    Asthma    Breast fibrocystic disorder 2013   Edema 09/15/2017   Hernia 2011   chest wall   Hypertension    Iron deficiency anemia 09/15/2017   Lung mass 2011   Sarcoidosis    Shortness of breath    2011   Stroke (HCC)    Tachycardia, unspecified 09/15/2017    Family History: Family History  Problem Relation Age of Onset   Liver cancer Mother 70   Bone cancer Maternal Aunt 60   Breast cancer Maternal Aunt    Breast cancer Paternal Aunt    Kidney cancer Father    Bone cancer Paternal Aunt 69    Social History   Socioeconomic History   Marital status: Divorced    Spouse name: Not on file   Number of children: Not on file   Years of education: Not on file   Highest education level: Not on file  Occupational History   Not on file  Tobacco Use   Smoking status: Never   Smokeless tobacco: Never  Vaping Use   Vaping status: Never Used  Substance and Sexual Activity   Alcohol use: Not Currently    Comment: on occassion   Drug use: No   Sexual activity: Not on file  Other Topics Concern   Not on file  Social History Narrative   Not on file   Social Drivers of Health   Financial Resource Strain: Not on file  Food Insecurity: Not on file  Transportation Needs: Not on file  Physical Activity: Not on file  Stress: Not on file  Social Connections: Not on file   Intimate Partner Violence: Not on file      Review of Systems  Constitutional:  Negative for activity change, appetite change, chills, fatigue, fever and unexpected weight change.  HENT: Negative.  Negative for congestion, ear pain, rhinorrhea, sore throat and trouble swallowing.   Eyes: Negative.   Respiratory:  Negative for cough, chest tightness, shortness of breath and wheezing.   Cardiovascular: Negative.  Negative for chest pain.  Gastrointestinal: Negative.  Negative for abdominal pain, blood in stool, constipation, diarrhea, nausea and vomiting.  Endocrine: Negative.   Genitourinary: Negative.  Negative for difficulty urinating, dysuria, frequency, hematuria and urgency.  Musculoskeletal:  Positive for  arthralgias, back pain and myalgias. Negative for joint swelling and neck pain.  Skin: Negative.  Negative for rash and wound.  Allergic/Immunologic: Negative.  Negative for immunocompromised state.  Neurological: Negative.  Negative for dizziness, seizures, numbness and headaches.  Hematological: Negative.   Psychiatric/Behavioral: Negative.  Negative for behavioral problems, self-injury and suicidal ideas. The patient is not nervous/anxious.     Vital Signs: BP 134/86   Pulse 100   Temp (!) 97 F (36.1 C)   Resp 16   Ht 5' 6 (1.676 m)   Wt 170 lb (77.1 kg)   SpO2 97%   BMI 27.44 kg/m    Physical Exam Vitals reviewed.  Constitutional:      General: She is awake. She is not in acute distress.    Appearance: Normal appearance. She is well-developed, well-groomed and overweight. She is not diaphoretic.  HENT:     Head: Normocephalic and atraumatic.     Right Ear: Tympanic membrane, ear canal and external ear normal.     Left Ear: Tympanic membrane, ear canal and external ear normal.     Nose: Nose normal.     Mouth/Throat:     Lips: Pink.     Mouth: Mucous membranes are moist.     Pharynx: Oropharynx is clear. Uvula midline. No oropharyngeal exudate or posterior  oropharyngeal erythema.  Eyes:     General: Lids are normal. Vision grossly intact. Gaze aligned appropriately. No scleral icterus.       Right eye: No discharge.        Left eye: No discharge.     Conjunctiva/sclera: Conjunctivae normal.     Pupils: Pupils are equal, round, and reactive to light.     Funduscopic exam:    Right eye: Red reflex present.        Left eye: Red reflex present. Neck:     Thyroid : No thyromegaly.     Vascular: No JVD.     Trachea: Trachea and phonation normal. No tracheal deviation.  Cardiovascular:     Rate and Rhythm: Normal rate and regular rhythm.     Pulses: Normal pulses.     Heart sounds: Normal heart sounds, S1 normal and S2 normal. No murmur heard.    No friction rub. No gallop.  Pulmonary:     Effort: Pulmonary effort is normal. No accessory muscle usage or respiratory distress.     Breath sounds: Normal breath sounds and air entry. No stridor. No wheezing or rales.  Chest:     Chest wall: No tenderness.     Comments: Declined clinical breast exam Abdominal:     General: Bowel sounds are normal. There is no distension.     Palpations: Abdomen is soft. There is no shifting dullness, fluid wave, mass or pulsatile mass.     Tenderness: There is no abdominal tenderness. There is no guarding or rebound.  Musculoskeletal:        General: No tenderness or deformity. Normal range of motion.     Cervical back: Normal range of motion and neck supple.  Lymphadenopathy:     Cervical: No cervical adenopathy.  Skin:    General: Skin is warm and dry.     Capillary Refill: Capillary refill takes less than 2 seconds.     Coloration: Skin is not pale.     Findings: No erythema or rash.  Neurological:     Mental Status: She is alert and oriented to person, place, and time.     Cranial  Nerves: No cranial nerve deficit.     Motor: No abnormal muscle tone.     Coordination: Coordination normal.     Gait: Gait normal.     Deep Tendon Reflexes: Reflexes are  normal and symmetric.  Psychiatric:        Mood and Affect: Affect normal. Mood is anxious.        Behavior: Behavior normal. Behavior is cooperative.        Thought Content: Thought content normal.        Cognition and Memory: Memory is impaired.        Judgment: Judgment normal.        Assessment/Plan: 1. Encounter for routine adult health examination with abnormal findings (Primary) Age-appropriate preventive screenings and vaccinations discussed, annual physical exam completed. Routine labs for health maintenance ordered, see below. PHM updated.   - metoCLOPramide  (REGLAN ) 5 MG tablet; Take 1 tablet (5 mg total) by mouth 4 (four) times daily -  before meals and at bedtime. As needed only  Dispense: 120 tablet; Refill: 3 - estradiol  (ESTRACE ) 0.5 MG tablet; Take 1 tablet (0.5 mg total) by mouth daily.  Dispense: 90 tablet; Refill: 1  2. Sarcoidosis of lung Continue medications as prescribed.  - predniSONE  (DELTASONE ) 10 MG tablet; Take 1 tablet (10 mg total) by mouth daily with breakfast.  Dispense: 30 tablet; Refill: 2 - hydroxychloroquine  (PLAQUENIL ) 200 MG tablet; Take 1 tablet (200 mg total) by mouth 2 (two) times daily.  Dispense: 180 tablet; Refill: 1 - albuterol  (VENTOLIN  HFA) 108 (90 Base) MCG/ACT inhaler; Inhale 2 puffs into the lungs every 6 (six) hours as needed for wheezing or shortness of breath.  Dispense: 18 g; Refill: 3  3. Gastroesophageal reflux disease without esophagitis Pantoprazole  prescribed  - pantoprazole  (PROTONIX ) 20 MG tablet; Take 1 tablet (20 mg total) by mouth daily.  Dispense: 30 tablet; Refill: 3  4. Prediabetes Routine labs ordered  - CBC with Differential/Platelet - CMP14+EGFR - Lipid Profile - Hgb A1C w/o eAG - B12 and Folate Panel - Vitamin D  (25 hydroxy) - Parathyroid  hormone, intact (no Ca)  5. Elevated parathyroid  hormone Routine lab ordered  - Parathyroid  hormone, intact (no Ca)  6. Mixed hyperlipidemia Routine labs ordered  -  CBC with Differential/Platelet - CMP14+EGFR - Lipid Profile - Hgb A1C w/o eAG - B12 and Folate Panel - Vitamin D  (25 hydroxy) - Parathyroid  hormone, intact (no Ca)  7. B12 deficiency Routine labs ordered  - CBC with Differential/Platelet - CMP14+EGFR - Lipid Profile - Hgb A1C w/o eAG - B12 and Folate Panel - Vitamin D  (25 hydroxy) - Parathyroid  hormone, intact (no Ca)  8. Vitamin D  deficiency Routine labs ordered  - CBC with Differential/Platelet - CMP14+EGFR - Lipid Profile - Hgb A1C w/o eAG - B12 and Folate Panel - Vitamin D  (25 hydroxy) - Parathyroid  hormone, intact (no Ca)  9. Encounter for screening mammogram for malignant neoplasm of breast Routine mammogram ordered  - MM 3D SCREENING MAMMOGRAM BILATERAL BREAST; Future  10. Generalized anxiety disorder Continue prn clonazepam  as prescribed. Follow up in 3 months for additional refills  - clonazePAM  (KLONOPIN ) 0.5 MG tablet; Take 1 tablet (0.5 mg total) by mouth daily.  Dispense: 90 tablet; Refill: 0     General Counseling: Brittany Huynh verbalizes understanding of the findings of todays visit and agrees with plan of treatment. I have discussed any further diagnostic evaluation that may be needed or ordered today. We also reviewed her medications today. she has been encouraged to  call the office with any questions or concerns that should arise related to todays visit.    Orders Placed This Encounter  Procedures   MM 3D SCREENING MAMMOGRAM BILATERAL BREAST   CBC with Differential/Platelet   CMP14+EGFR   Lipid Profile   Hgb A1C w/o eAG   B12 and Folate Panel   Vitamin D  (25 hydroxy)   Parathyroid  hormone, intact (no Ca)    Meds ordered this encounter  Medications   metoCLOPramide  (REGLAN ) 5 MG tablet    Sig: Take 1 tablet (5 mg total) by mouth 4 (four) times daily -  before meals and at bedtime. As needed only    Dispense:  120 tablet    Refill:  3    Patient will call when she needs this med filled    pantoprazole  (PROTONIX ) 20 MG tablet    Sig: Take 1 tablet (20 mg total) by mouth daily.    Dispense:  30 tablet    Refill:  3    Fill new script today   predniSONE  (DELTASONE ) 10 MG tablet    Sig: Take 1 tablet (10 mg total) by mouth daily with breakfast.    Dispense:  30 tablet    Refill:  2   hydroxychloroquine  (PLAQUENIL ) 200 MG tablet    Sig: Take 1 tablet (200 mg total) by mouth 2 (two) times daily.    Dispense:  180 tablet    Refill:  1   estradiol  (ESTRACE ) 0.5 MG tablet    Sig: Take 1 tablet (0.5 mg total) by mouth daily.    Dispense:  90 tablet    Refill:  1   clonazePAM  (KLONOPIN ) 0.5 MG tablet    Sig: Take 1 tablet (0.5 mg total) by mouth daily.    Dispense:  90 tablet    Refill:  0   albuterol  (VENTOLIN  HFA) 108 (90 Base) MCG/ACT inhaler    Sig: Inhale 2 puffs into the lungs every 6 (six) hours as needed for wheezing or shortness of breath.    Dispense:  18 g    Refill:  3    Return in about 3 months (around 08/30/2024) for F/U, med refill, Tahiri Shareef PCP.   Total time spent:30 Minutes Time spent includes review of chart, medications, test results, and follow up plan with the patient.   [place] Controlled Substance Database was reviewed by me.  This patient was seen by Mardy Maxin, FNP-C in collaboration with Dr. Sigrid Bathe as a part of collaborative care agreement.  Winner Valeriano R. Maxin, MSN, FNP-C Internal medicine

## 2024-06-03 NOTE — Telephone Encounter (Signed)
Notified patient of mammogram appointment date, arrival time, location-Toni 

## 2024-07-08 ENCOUNTER — Ambulatory Visit
Admission: RE | Admit: 2024-07-08 | Discharge: 2024-07-08 | Disposition: A | Discharge status: Home / Self Care | Source: Ambulatory Visit | Attending: Nurse Practitioner | Admitting: Nurse Practitioner

## 2024-07-08 DIAGNOSIS — Z1231 Encounter for screening mammogram for malignant neoplasm of breast: Secondary | ICD-10-CM | POA: Diagnosis not present

## 2024-09-02 ENCOUNTER — Ambulatory Visit: Admitting: Nurse Practitioner

## 2024-09-04 ENCOUNTER — Other Ambulatory Visit: Payer: Self-pay | Admitting: Nurse Practitioner

## 2024-09-04 DIAGNOSIS — K219 Gastro-esophageal reflux disease without esophagitis: Secondary | ICD-10-CM

## 2024-09-23 ENCOUNTER — Encounter: Payer: Self-pay | Admitting: Nurse Practitioner

## 2024-09-23 ENCOUNTER — Telehealth: Admitting: Nurse Practitioner

## 2024-09-23 DIAGNOSIS — F411 Generalized anxiety disorder: Secondary | ICD-10-CM | POA: Diagnosis not present

## 2024-09-23 DIAGNOSIS — J0100 Acute maxillary sinusitis, unspecified: Secondary | ICD-10-CM | POA: Diagnosis not present

## 2024-09-23 DIAGNOSIS — T753XXA Motion sickness, initial encounter: Secondary | ICD-10-CM

## 2024-09-23 MED ORDER — BENZONATATE 200 MG PO CAPS: 200.0000 mg | ORAL_CAPSULE | Freq: Two times a day (BID) | ORAL | 0 refills | Status: AC | PRN

## 2024-09-23 MED ORDER — SCOPOLAMINE 1 MG/3DAYS TD PT72: 1.0000 | MEDICATED_PATCH | TRANSDERMAL | 0 refills | Status: AC

## 2024-09-23 MED ORDER — CLONAZEPAM 0.5 MG PO TABS: 0.5000 mg | ORAL_TABLET | Freq: Every day | ORAL | 0 refills | Status: AC

## 2024-09-23 MED ORDER — AMOXICILLIN-POT CLAVULANATE 875-125 MG PO TABS: 1.0000 | ORAL_TABLET | Freq: Two times a day (BID) | ORAL | 0 refills | Status: AC

## 2024-09-23 NOTE — Progress Notes (Signed)
 [facility] [address] [place], [place] [address]  Internal MEDICINE  Telephone Visit  Patient Name: Brittany Huynh  [id number]  [id number]  Date of Service: 09/23/2024  I connected with the patient at 1000 by telephone and verified the patients identity using two identifiers.   I discussed the limitations, risks, security and privacy concerns of performing an evaluation and management service by telephone and the availability of in person appointments. I also discussed with the patient that there may be a patient responsible charge related to the service.  The patient expressed understanding and agrees to proceed.    Chief Complaint  Patient presents with   Telephone Screen    [id number]   Telephone Assessment   Follow-up   Cough    Going on for  2 days    Sinusitis    Negative for covid     HPI Brittany Huynh presents for a telehealth virtual visit for symptoms of URI and also medication refills.  URI symptoms -- onset of symptoms was 2 days ago. Negative for covid on home test. She reports cough, sinus pressure, sore throat, nasal congestion, runny nose, headache,  GAD -- takes clonazepam  as needed.    Current Medication: Outpatient Encounter Medications as of 09/23/2024  Medication Sig Note   acetaminophen  (TYLENOL ) 500 MG tablet Take 500 mg by mouth every 6 (six) hours as needed. 09/29/2020: Can take 1 or 2 at a time   albuterol  (VENTOLIN  HFA) 108 (90 Base) MCG/ACT inhaler Inhale 2 puffs into the lungs every 6 (six) hours as needed for wheezing or shortness of breath.    amoxicillin -clavulanate (AUGMENTIN ) 875-125 MG tablet Take 1 tablet by mouth 2 (two) times daily. May take with food    benzonatate  (TESSALON ) 200 MG capsule Take 1 capsule (200 mg total) by mouth 2 (two) times daily as needed for cough.    celecoxib  (CELEBREX ) 200 MG capsule Take 1 capsule (200 mg total) by mouth daily.    DULoxetine  (CYMBALTA ) 60 MG capsule Take by mouth.    estradiol  (ESTRACE ) 0.5  MG tablet Take 1 tablet (0.5 mg total) by mouth daily.    hydroxychloroquine  (PLAQUENIL ) 200 MG tablet Take 1 tablet (200 mg total) by mouth 2 (two) times daily.    metoCLOPramide  (REGLAN ) 5 MG tablet Take 1 tablet (5 mg total) by mouth 4 (four) times daily -  before meals and at bedtime. As needed only    nortriptyline  (PAMELOR ) 50 MG capsule TAKE 2 CAPSULES (100 MG TOTAL) BY MOUTH AT BEDTIME.    pantoprazole  (PROTONIX ) 20 MG tablet TAKE 1 TABLET BY MOUTH EVERY DAY    predniSONE  (DELTASONE ) 10 MG tablet Take 1 tablet (10 mg total) by mouth daily with breakfast.    QUEtiapine (SEROQUEL) 25 MG tablet Take 3 tablets by mouth at bedtime.    scopolamine  (TRANSDERM-SCOP) 1 MG/3DAYS Place 1 patch (1 mg total) onto the skin every 3 (three) days.    solifenacin  (VESICARE ) 10 MG tablet Take 1 tablet (10 mg total) by mouth daily.    [DISCONTINUED] clonazePAM  (KLONOPIN ) 0.5 MG tablet Take 1 tablet (0.5 mg total) by mouth daily.    clonazePAM  (KLONOPIN ) 0.5 MG tablet Take 1 tablet (0.5 mg total) by mouth daily.    No facility-administered encounter medications on file as of 09/23/2024.    Surgical History: Past Surgical History:  Procedure Laterality Date   ABDOMINAL HYSTERECTOMY  2009   BREAST BIOPSY Left 02/15/2018   PREDOMINANTLY MATURE ADIPOSE TISSUE WITH VERY FOCAL  AREAS OF BENIGN    BREAST EXCISIONAL BIOPSY     chamberlain procedure   2011   COLONOSCOPY WITH PROPOFOL  N/A 09/02/2020   Procedure: COLONOSCOPY WITH PROPOFOL ;  Surgeon: Unk Corinn Skiff, MD;  Location: [facility] ENDOSCOPY;  Service: Gastroenterology;  Laterality: N/A;   ESOPHAGOGASTRODUODENOSCOPY (EGD) WITH PROPOFOL  N/A 09/02/2020   Procedure: ESOPHAGOGASTRODUODENOSCOPY (EGD) WITH PROPOFOL ;  Surgeon: Unk Corinn Skiff, MD;  Location: [facility] ENDOSCOPY;  Service: Gastroenterology;  Laterality: N/A;   IRRIGATION AND DEBRIDEMENT HEMATOMA Left 03/07/2018   Procedure: IRRIGATION AND DEBRIDEMENT HEMATOMA-LEFT BREAST;  Surgeon: Dessa Reyes ORN,  MD;  Location: [facility] ORS;  Service: General;  Laterality: Left;   LUNG BIOPSY  2011   RESECTION RIBS EXTRAPLEURAL  [id number]    Medical History: Past Medical History:  Diagnosis Date   Anemia    Anxiety    Asthma    Breast fibrocystic disorder 2013   Edema 09/15/2017   Hernia 2011   chest wall   Hypertension    Iron deficiency anemia 09/15/2017   Lung mass 2011   Sarcoidosis    Shortness of breath    2011   Stroke (HCC)    Tachycardia, unspecified 09/15/2017    Family History: Family History  Problem Relation Age of Onset   Liver cancer Mother 16   Kidney cancer Father    Breast cancer Sister 19   Bone cancer Maternal Aunt 60   Breast cancer Maternal Aunt    Breast cancer Paternal Aunt    Bone cancer Paternal Aunt 14    Social History   Socioeconomic History   Marital status: Divorced    Spouse name: Not on file   Number of children: Not on file   Years of education: Not on file   Highest education level: Not on file  Occupational History   Not on file  Tobacco Use   Smoking status: Never   Smokeless tobacco: Never  Vaping Use   Vaping status: Never Used  Substance and Sexual Activity   Alcohol use: Not Currently    Comment: on occassion   Drug use: No   Sexual activity: Not on file  Other Topics Concern   Not on file  Social History Narrative   Not on file   Social Drivers of Health   Tobacco Use: Low Risk (06/03/2024)   Patient History    Smoking Tobacco Use: Never    Smokeless Tobacco Use: Never    Passive Exposure: Not on file  Financial Resource Strain: Not on file  Food Insecurity: Not on file  Transportation Needs: Not on file  Physical Activity: Not on file  Stress: Not on file  Social Connections: Not on file  Intimate Partner Violence: Not on file  Depression (PHQ2-9): Low Risk (09/23/2024)   Depression (PHQ2-9)    PHQ-2 Score: 0  Alcohol Screen: Low Risk (04/25/2022)   Alcohol Screen    Last Alcohol Screening Score (AUDIT): 2   Housing: Not on file  Utilities: Not on file  Health Literacy: Not on file      Review of Systems  Constitutional:  Positive for appetite change. Negative for chills, fatigue and fever.  HENT:  Positive for congestion, postnasal drip, rhinorrhea, sinus pressure, sinus pain and sore throat. Negative for ear pain.   Respiratory:  Positive for cough. Negative for chest tightness, shortness of breath and wheezing.   Cardiovascular: Negative.  Negative for chest pain and palpitations.  Gastrointestinal: Negative.  Negative for diarrhea and rectal pain.  Musculoskeletal:  Positive for myalgias.  Neurological:  Positive for headaches.    Vital Signs: There were no vitals taken for this visit.   Observation/Objective: She is alert and oriented. No acute distress noted.     Assessment/Plan: 1. Acute non-recurrent maxillary sinusitis (Primary) Augmentin  prescribed, take until gone. Cough medication prescribed as needed.  - amoxicillin -clavulanate (AUGMENTIN ) 875-125 MG tablet; Take 1 tablet by mouth 2 (two) times daily. May take with food  Dispense: 20 tablet; Refill: 0 - benzonatate  (TESSALON ) 200 MG capsule; Take 1 capsule (200 mg total) by mouth 2 (two) times daily as needed for cough.  Dispense: 30 capsule; Refill: 0  2. Sea sickness, initial encounter Scopolamine  patches ordered for upcoming cruise.  - scopolamine  (TRANSDERM-SCOP) 1 MG/3DAYS; Place 1 patch (1 mg total) onto the skin every 3 (three) days.  Dispense: 4 patch; Refill: 0  3. Generalized anxiety disorder Continue prn clonazepam  as prescribed.  - clonazePAM  (KLONOPIN ) 0.5 MG tablet; Take 1 tablet (0.5 mg total) by mouth daily.  Dispense: 90 tablet; Refill: 0   General Counseling: Jaislyn verbalizes understanding of the findings of today's phone visit and agrees with plan of treatment. I have discussed any further diagnostic evaluation that may be needed or ordered today. We also reviewed her medications today. she has  been encouraged to call the office with any questions or concerns that should arise related to todays visit.  Return in about 3 months (around 12/18/2024) for F/U, anxiety med refill, Miria Cappelli PCP.   No orders of the defined types were placed in this encounter.   Meds ordered this encounter  Medications   clonazePAM  (KLONOPIN ) 0.5 MG tablet    Sig: Take 1 tablet (0.5 mg total) by mouth daily.    Dispense:  90 tablet    Refill:  0   amoxicillin -clavulanate (AUGMENTIN ) 875-125 MG tablet    Sig: Take 1 tablet by mouth 2 (two) times daily. May take with food    Dispense:  20 tablet    Refill:  0    Fill new script today.   benzonatate  (TESSALON ) 200 MG capsule    Sig: Take 1 capsule (200 mg total) by mouth 2 (two) times daily as needed for cough.    Dispense:  30 capsule    Refill:  0    Fill new script today   scopolamine  (TRANSDERM-SCOP) 1 MG/3DAYS    Sig: Place 1 patch (1 mg total) onto the skin every 3 (three) days.    Dispense:  4 patch    Refill:  0    Time spent:20 Minutes Time spent with patient included reviewing progress notes, labs, imaging studies, and discussing plan for follow up.  [place] Controlled Substance Database was reviewed by me for overdose risk score (ORS) if appropriate.  This patient was seen by Mardy Maxin, FNP-C in collaboration with Dr. Sigrid Bathe as a part of collaborative care agreement.  Bonna Steury R. Maxin, MSN, FNP-C Internal medicine

## 2024-12-16 ENCOUNTER — Ambulatory Visit: Admitting: Nurse Practitioner

## 2025-06-04 ENCOUNTER — Encounter: Admitting: Nurse Practitioner
